# Patient Record
Sex: Female | Born: 1940 | Race: White | Hispanic: No | Marital: Married | Smoking: Never smoker
Health system: Southern US, Community
[De-identification: ages and names within clinical notes are randomized; demographics above are authoritative.]

## PROBLEM LIST (undated history)

## (undated) DIAGNOSIS — H269 Unspecified cataract: Secondary | ICD-10-CM

## (undated) DIAGNOSIS — F32A Depression, unspecified: Secondary | ICD-10-CM

## (undated) DIAGNOSIS — K219 Gastro-esophageal reflux disease without esophagitis: Secondary | ICD-10-CM

## (undated) DIAGNOSIS — M1990 Unspecified osteoarthritis, unspecified site: Secondary | ICD-10-CM

## (undated) DIAGNOSIS — F329 Major depressive disorder, single episode, unspecified: Secondary | ICD-10-CM

## (undated) DIAGNOSIS — M069 Rheumatoid arthritis, unspecified: Secondary | ICD-10-CM

## (undated) DIAGNOSIS — R51 Headache: Secondary | ICD-10-CM

## (undated) DIAGNOSIS — R011 Cardiac murmur, unspecified: Secondary | ICD-10-CM

## (undated) DIAGNOSIS — E039 Hypothyroidism, unspecified: Secondary | ICD-10-CM

## (undated) DIAGNOSIS — T7840XA Allergy, unspecified, initial encounter: Secondary | ICD-10-CM

## (undated) DIAGNOSIS — F419 Anxiety disorder, unspecified: Secondary | ICD-10-CM

## (undated) DIAGNOSIS — R7303 Prediabetes: Secondary | ICD-10-CM

## (undated) HISTORY — DX: Allergy, unspecified, initial encounter: T78.40XA

## (undated) HISTORY — PX: APPENDECTOMY: SHX54

## (undated) HISTORY — PX: JOINT REPLACEMENT: SHX530

## (undated) HISTORY — PX: CHOLECYSTECTOMY: SHX55

## (undated) HISTORY — PX: SPINE SURGERY: SHX786

## (undated) HISTORY — PX: TOTAL HIP ARTHROPLASTY: SHX124

## (undated) HISTORY — PX: ABDOMINAL HYSTERECTOMY: SHX81

## (undated) HISTORY — PX: REPLACEMENT TOTAL KNEE BILATERAL: SUR1225

## (undated) HISTORY — DX: Unspecified cataract: H26.9

## (undated) HISTORY — PX: TONSILLECTOMY: SUR1361

## (undated) HISTORY — DX: Gastro-esophageal reflux disease without esophagitis: K21.9

---

## 2001-05-12 ENCOUNTER — Encounter: Payer: Self-pay | Admitting: Orthopedic Surgery

## 2001-05-20 ENCOUNTER — Encounter: Payer: Self-pay | Admitting: Orthopedic Surgery

## 2001-05-20 ENCOUNTER — Inpatient Hospital Stay (HOSPITAL_COMMUNITY): Admission: RE | Admit: 2001-05-20 | Discharge: 2001-05-27 | Payer: Self-pay | Admitting: Orthopedic Surgery

## 2008-07-30 ENCOUNTER — Ambulatory Visit (HOSPITAL_BASED_OUTPATIENT_CLINIC_OR_DEPARTMENT_OTHER): Admission: RE | Admit: 2008-07-30 | Discharge: 2008-07-30 | Payer: Self-pay | Admitting: Internal Medicine

## 2008-07-30 ENCOUNTER — Ambulatory Visit: Payer: Self-pay | Admitting: Diagnostic Radiology

## 2010-09-12 NOTE — Op Note (Signed)
[facility]  Patient:    Joann Maddox, Joann Maddox Visit Number: [id number] MRN: [id number]          Service Type: SUR Location: 4W 0471 01 Attending Physician:  Skip Mayer Dictated by:   Georges Lynch Darrelyn Hillock, M.D. Proc. Date: 05/20/01 Admit Date:  05/20/2001                             Operative Report  SURGEON:  Windy Fast A. Darrelyn Hillock, M.D.  ASSISTANT:  Ollen Gross, M.D.  PREOPERATIVE DIAGNOSIS:  Severe degenerative arthritis, right hip.  POSTOPERATIVE DIAGNOSIS:  Severe degenerative arthritis, right hip.  OPERATION:  Right total hip arthroplasty utilizing the porous-coated system, Osteonics-type.  Sizes used were a size 8 femoral component for the femoral component.  The cup was a 50 mm PSL microstructured cup with two screws, and the insert was a size 10 degree insert.  The head size was a plus 5 C-tapered head.  DESCRIPTION OF PROCEDURE:  Under general anesthesia, a routine orthopedic prep and draping of the right hip was carried out.  She had 1 g of IV Ancef preop. At this time, an incision was made over the posterolateral aspect of the right hip, bleeders identified and cauterized.  The incision was carried down to the iliotibial band; an incision was made in the iliotibial band, and self-retaining retractors were inserted.  At this time, I then detached the external rotators with great care taken not to injure the sciatic nerve.  We detached the Zickel band as well.  I then did a capsulectomy, dislocated the head, and then amputated the femoral head in the usual fashion.  Following that, we then reamed and rasped up to a size 8 femoral component.  The distal ream was a 12.5.  Following that, we then prepared the acetabulum by reaming up to a size 50 mm cup.  We then inserted our permanent cup with two screws. We inserted our trial liner, went through a range of motion with a plus 0 then a plus 5 C-tapered head.  The plus 5 C-tapered head  was the most stable.  We had excellent position of the cup.  We had excellent stability on the table. Following that, we then thoroughly irrigated out the area, removed the trial cup insert, and then inserted our permanent 10 degree polyethylene liner. After this, we then inserted our permanent secure-fit 127 degree angle size 8 femoral component.  We then went through a plus 0 then a plus 5 C-tapered head for trials once again and felt that the plus 5 C-tapered head was the most stable.  We had excellent stability and good range of motion.  There was no impingement.  So we then removed the trial plus 5 C-tapered head and inserted our permanent plus 5 C-tapered head.  We then irrigated out the acetabulum, reduced the hip, and had excellent function.  We then repaired all of the soft tissue structures, and the wound was closed in layers in the usual fashion. Sterile dressings were applied.  The patient was given another gram of IV Ancef before she left the operating room.  She had a clot in her right leg 15 years ago when she had arthroscopy, and she was put on Coumadin at that time. So we will take all of the precautions.  We will have her on a PAS compression hose.  She will be on a heparin protocol for three days and Coumadin  at the same time. Dictated by:   Georges Lynch Darrelyn Hillock, M.D. Attending Physician:  Skip Mayer DD:  05/20/01 TD:  05/21/01 Job: [phone]

## 2010-09-12 NOTE — Discharge Summary (Signed)
[facility]  Patient:    Joann Maddox, MAJER Visit Number: [id number] MRN: [id number]          Service Type: SUR Location: 4W 0471 01 Attending Physician:  Skip Mayer Dictated by:   Marcie Bal Velna Ochs, P.A.-C Admit Date:  05/20/2001 Discharge Date: 05/27/2001   CC:         Macarthur Critchley. Shelva Majestic, M.D.  Daniel L. Thomasena Edis, M.D.   Discharge Summary  PRIMARY DIAGNOSES: 1. Degenerative arthritis, right hip. 2. Acute blood loss anemia, status post transfusion.  SECONDARY DIAGNOSES: 1. Thyroid disorder. 2. Anxiety disorder. 3. History of anemia. 4. History of urinary tract infections. 5. History of heart murmur. 6. Migraine headaches. 7. History of gastroesophageal reflux. 8. History of blood clot, right leg.  SURGICAL PROCEDURE:  Right total hip arthroplasty by Dr. Darrelyn Hillock with the assistance of Dr. Lequita Halt on May 20, 2001.  Please see operative summary for further details.  CONSULTATIONS:  Dr. Thomasena Edis in physical medicine and rehabilitation.  LABORATORY DATA:  Preoperative hemoglobin and hematocrit were 12.2 and 37.2, respectively on May 12, 2001.  She reached a low on May 23, 2001, of 8.0 and 23.9, respectively.  She was transfused 2 units, and on May 26, 2001, she was back up to 10.4 and 29.7, respectively.  Preoperative PT, INR, and PTT were 13.8, 1.1, and 31, respectively.  Prior to discharge, she had a therapeutic INR of 2.3.  Routine chemistry preoperatively was all within normal limits, with the exception of slightly elevated glucose of 126.  Her BUN and creatinine were 14 and 0.6.  Sodium and potassium were 138 and 3.7, respectively.  Urinalysis preoperatively was all within normal limits with negative parameters.  A venous Doppler on May 26, 2001, showed no evidence of deep venous thrombosis or superficial thrombus or Bakers cyst.  Chest x-ray on May 12, 2001, showed no active chest disease.   Postoperative hip x-ray showed satisfactory right total hip replacement.  CHIEF COMPLAINT:  Right hip pain.  HISTORY OF PRESENT ILLNESS:  Joann Maddox is a 70 year old female who presents with complaints of increasing right hip pain for more than a years duration. She had undergone conservative measures without significant relief.  Pain was now interfering with her activities of daily living.  She elected to undergo surgical intervention.  Dr. Shelva Majestic obtained a preoperative Cardiolite study, and felt the patient was stable and clear for the planned surgery. Preoperative labs and signed surgical consents were obtained.  She was admitted on May 20, 2001, for surgical intervention.  HOSPITAL COURSE:  Following the surgical procedure, the patient was taken to the recovery room in stable condition, transferred to the orthopedic floor in good condition.  She had progressed slowly with therapy.  Initially, she was on PCA morphine which was then switched to PCA Dilaudid for better pain control.  She was on Coumadin for deep vein thrombosis prophylaxis, and had a therapeutic INR prior to discharge.  She received routine postoperative antibiotics.  Her hemoglobin dropped to 8.0 on postoperative day #3, and she was transfused 2 units of packed red blood cells.  This brought her hemoglobin back up, and she remained stable afterwards.  The wound was examined on postoperative day #2, and subsequently, and she did have some continued serous drainage from that hip, so she was started on p.o. Keflex.  Otherwise, there were no signs or symptoms of infection.  A rehabilitation consult was requested, and Dr. Thomasena Edis felt the patient would be appropriate for inpatient  rehabilitation given her slow progress with therapy.  However, [employer] would not approve any inpatient rehabilitation stay.  Therefore, rehabilitation with physical therapy was continued on this acute admission. Home health  arrangements were made.  On postoperative day #6, she was doing better with therapy and ambulating up to about 60 feet.  She did have tenderness and swelling in the affected surgical leg, and given her history of deep venous thrombosis, a venous Doppler was obtained which showed no evidence of an acute deep venous thrombosis.  By postoperative day #7, on May 27, 2001, she was stable and ready for discharge home.  DISPOSITION:  The patient is being discharged home with [facility].  DIET:  Low sodium.  FOLLOWUP:  Dr. Darrelyn Hillock in one week.  Call [phone] for an appointment.  ACTIVITY:  Touchdown weightbearing.  Total hip dislocation precautions.  Okay to shower once daily.  B.i.d. dressing changes to the hip.  DISCHARGE MEDICATIONS: 1. Coumadin per pharmacy dosing. 2. Mepergan Fortis one p.o. q.4-6h. p.r.n. pain. 3. Robaxin 500 mg p.o. q.8h. p.r.n. spasm. 4. Keflex 500 mg p.o. q.i.d. x5 days. 5. Nadolol 40 mg p.o. q.d. 6. Celexa 20 mg p.o. q.d. 7. Armour-Thyroid 30 mg p.o. q.d. 8. Senokot.  CONDITION ON DISCHARGE:  Good and improved. Dictated by:   Marcie Bal Velna Ochs, P.A.-C Attending Physician:  Skip Mayer DD:  06/08/01 TD:  06/08/01 Job: [id number]

## 2010-09-12 NOTE — H&P (Signed)
[facility]  Patient:    Joann Maddox, Joann Maddox Visit Number: [id number] MRN: [id number]          Service Type: Attending:  Georges Lynch. Darrelyn Hillock, M.D. Dictated by:   Marcie Bal Troncale, P.A.C. Adm. Date:  05/20/01                           History and Physical  DATE OF BIRTH:  12-23-1940  SSN:  [id number]  CHIEF COMPLAINT:  Right hip pain.  HISTORY OF PRESENT ILLNESS:  Joann Maddox is a 70 year old female who presents with increasing right hip pain for more than a years duration.  She is now requiring the use of a cane to simply get around.  She reports daily pain that is at this point unrelieved with conservative measures.  She has now elected to undergo surgical intervention.  REVIEW OF SYSTEMS:  She denies any recent fever or chills.  No diplopia, blurred vision, or headaches.  No rhinorrhea, sore throat, or earache.  No chest pain, shortness of breath, or cough.  She denies abdominal pain, nausea, vomiting, or diarrhea.  She does report a significant history of constipation. She denies any melena or bright red blood per rectum.  No urinary frequency, hematuria, or dysuria.  No numbness or tingling of her extremities.  PAST MEDICAL HISTORY: 1. Significant for a thyroid disorder. 2. Anxiety disorder. 3. Past history of a blood clot in her right leg after knee arthroscopy in    1987. 4. Also history of frequent UTIs. 5. Gastroesophageal reflux. 6. Migraine headaches. 7. Heart murmur. 8. Anemia. 9. She denies strokes, seizures, cancer, diabetes, hypertension, heart, lung,    or kidney disease.  PAST SURGICAL HISTORY: 1. Hysterectomy. 2. Knee arthroscopy. 3. Appendectomy. 4. Cholecystectomy.  FAMILY HISTORY:  Mother had a stroke.  Father had an MI, and her son has Sticklers disease.  ALLERGIES:  CODEINE and IODINE DYE.  She is able to tolerate Betadine.  MEDICATIONS: 1. Nadolol 40 mg q.d. 2. Celexa 20 mg q.d. 3. Armour Thyroid 30 mg  q.d. 4. Fioricet p.r.n. 5. Senokot. 6. Mobic. 7. Ultram. 8. Estropipate 1.5 mg q.d.  SOCIAL HISTORY:  She is married.  She is retired.  She denies tobacco or alcohol use.  She has three steps leading into her home.  Her main living space is on one floor.  Dr. Sundra Aland in [place] is the patients family physician, and Dr. Shelva Majestic in [facility] is her cardiologist.  PHYSICAL EXAMINATION:  VITAL SIGNS:  She is afebrile, pulse 74, respiratory 14, blood pressure 122/90.  GENERAL:  This is a well-developed, well-nourished female in no acute distress.  She walks with a marked antalgic gait on the right.  HEENT:  Head is atraumatic, normocephalic.  Pupils equal, round, and react to light.  Extraocular movements are grossly intact.  Oropharynx is clear without redness, exudate, or lesions.  NECK:  Supple with no cervical lymphadenopathy.  CHEST:  Clear to auscultation bilaterally with no lesions or crackles.  HEART:  Regular rate and rhythm with no murmurs, rubs, or gallops.  ABDOMEN:  Soft, nontender, nondistended, with no masses and no hepatosplenomegaly.  Active bowel sounds.  BREASTS/GENITOURINARY:  Not examined, is not pertinent to present illness.  EXTREMITIES:  Shows good peripheral pulses.  Motor and sensory are grossly intact.  She does have marked restriction of range of motion on the right hip with 5 degrees internal rotation and 5 degrees of external  rotation.  Her leg lengths are equal.  LABORATORY:  X-rays demonstrate severe, degenerative arthritis of the right hip.  IMPRESSION: 1. Degenerative arthritis, right hip. 2. Thyroid disorder. 3. Anxiety disorder. 4. History of anemia. 5. History of urinary tract infection. 6. History of heart murmur. 7. Migraine headaches. 8. History of gastroesophageal reflux. 9. History of blood clot, right leg.  PLAN:  The patient will be admitted to [facility] to undergo a right total hip arthroplasty by Dr.  Darrelyn Hillock on May 20, 2001.  Preoperative labs and signed surgical consents will be obtained.  Dr. Darrelyn Hillock has been in contact with Dr. Shelva Majestic, who cleared the patient for surgry.  Dr. Shelva Majestic was going to obtain a Cardiolite study on the patient preoperatively as well.  All questions have been encouraged and answered. Dictated by:   Marcie Bal Troncale, P.A.C. Attending:  Georges Lynch. Darrelyn Hillock, M.D. DD:  05/12/01 TD:  05/12/01 Job: [id number]

## 2012-01-08 ENCOUNTER — Other Ambulatory Visit (HOSPITAL_COMMUNITY): Payer: Self-pay | Admitting: *Deleted

## 2012-01-12 DIAGNOSIS — R739 Hyperglycemia, unspecified: Secondary | ICD-10-CM | POA: Insufficient documentation

## 2012-01-20 ENCOUNTER — Encounter (HOSPITAL_COMMUNITY)
Admission: RE | Admit: 2012-01-20 | Discharge: 2012-01-20 | Disposition: A | Discharge status: Home / Self Care | Payer: MEDICARE | Source: Ambulatory Visit | Attending: Internal Medicine | Admitting: Internal Medicine

## 2012-01-20 ENCOUNTER — Encounter (HOSPITAL_COMMUNITY): Payer: Self-pay

## 2012-01-20 DIAGNOSIS — M069 Rheumatoid arthritis, unspecified: Secondary | ICD-10-CM | POA: Insufficient documentation

## 2012-01-20 HISTORY — DX: Headache: R51

## 2012-01-20 HISTORY — DX: Anxiety disorder, unspecified: F41.9

## 2012-01-20 HISTORY — DX: Cardiac murmur, unspecified: R01.1

## 2012-01-20 MED ORDER — SODIUM CHLORIDE 0.9 % IV SOLN: 1000.0000 mg | INTRAVENOUS | Status: DC

## 2012-01-20 MED ORDER — SODIUM CHLORIDE 0.9 % IV SOLN
1000.0000 mg | Freq: Once | INTRAVENOUS | Status: AC
  Administered 2012-01-20: 1000 mg via INTRAVENOUS
  Filled 2012-01-20: qty 40

## 2012-01-20 MED ORDER — LORATADINE 10 MG PO TABS
10.0000 mg | ORAL_TABLET | Freq: Once | ORAL | Status: AC
  Administered 2012-01-20: 10 mg via ORAL
  Filled 2012-01-20: qty 1

## 2012-01-20 MED ORDER — SODIUM CHLORIDE 0.9 % IV SOLN
INTRAVENOUS | Status: DC
  Administered 2012-01-20: 14:00:00 via INTRAVENOUS

## 2012-01-20 MED ORDER — ACETAMINOPHEN 500 MG PO TABS
500.0000 mg | ORAL_TABLET | Freq: Once | ORAL | Status: AC
  Administered 2012-01-20: 500 mg via ORAL

## 2012-01-20 MED ORDER — ACETAMINOPHEN 500 MG PO TABS
ORAL_TABLET | ORAL | Status: AC
  Administered 2012-01-20: 500 mg via ORAL
  Filled 2012-01-20: qty 1

## 2012-02-03 ENCOUNTER — Encounter (HOSPITAL_COMMUNITY): Payer: Self-pay

## 2012-02-03 ENCOUNTER — Encounter (HOSPITAL_COMMUNITY)
Admission: RE | Admit: 2012-02-03 | Discharge: 2012-02-03 | Disposition: A | Discharge status: Home / Self Care | Payer: MEDICARE | Source: Ambulatory Visit | Attending: Internal Medicine | Admitting: Internal Medicine

## 2012-02-03 DIAGNOSIS — M069 Rheumatoid arthritis, unspecified: Secondary | ICD-10-CM | POA: Insufficient documentation

## 2012-02-03 HISTORY — DX: Rheumatoid arthritis, unspecified: M06.9

## 2012-02-03 HISTORY — DX: Hypothyroidism, unspecified: E03.9

## 2012-02-03 MED ORDER — LORATADINE 10 MG PO TABS
10.0000 mg | ORAL_TABLET | Freq: Once | ORAL | Status: AC
  Administered 2012-02-03: 10 mg via ORAL
  Filled 2012-02-03: qty 1

## 2012-02-03 MED ORDER — SODIUM CHLORIDE 0.9 % IV SOLN
INTRAVENOUS | Status: DC
  Administered 2012-02-03: 14:00:00 via INTRAVENOUS

## 2012-02-03 MED ORDER — ACETAMINOPHEN 500 MG PO TABS
500.0000 mg | ORAL_TABLET | Freq: Once | ORAL | Status: AC
  Administered 2012-02-03: 500 mg via ORAL
  Filled 2012-02-03: qty 1

## 2012-02-03 MED ORDER — SODIUM CHLORIDE 0.9 % IV SOLN
1000.0000 mg | Freq: Once | INTRAVENOUS | Status: AC
  Administered 2012-02-03: 1000 mg via INTRAVENOUS
  Filled 2012-02-03: qty 40

## 2012-02-16 DIAGNOSIS — N3946 Mixed incontinence: Secondary | ICD-10-CM | POA: Insufficient documentation

## 2012-02-16 DIAGNOSIS — R5383 Other fatigue: Secondary | ICD-10-CM | POA: Insufficient documentation

## 2012-02-16 DIAGNOSIS — N3941 Urge incontinence: Secondary | ICD-10-CM | POA: Insufficient documentation

## 2012-02-17 ENCOUNTER — Encounter (HOSPITAL_COMMUNITY): Payer: Self-pay

## 2012-02-17 ENCOUNTER — Encounter (HOSPITAL_COMMUNITY)
Admission: RE | Admit: 2012-02-17 | Discharge: 2012-02-17 | Disposition: A | Discharge status: Home / Self Care | Payer: MEDICARE | Source: Ambulatory Visit | Attending: Internal Medicine | Admitting: Internal Medicine

## 2012-02-17 MED ORDER — SODIUM CHLORIDE 0.9 % IV SOLN
1000.0000 mg | Freq: Once | INTRAVENOUS | Status: AC
  Administered 2012-02-17: 1000 mg via INTRAVENOUS
  Filled 2012-02-17: qty 40

## 2012-02-17 MED ORDER — SODIUM CHLORIDE 0.9 % IV SOLN
Freq: Once | INTRAVENOUS | Status: AC
  Administered 2012-02-17: 14:00:00 via INTRAVENOUS

## 2012-02-17 MED ORDER — LORATADINE 10 MG PO TABS
10.0000 mg | ORAL_TABLET | Freq: Once | ORAL | Status: AC
  Administered 2012-02-17: 10 mg via ORAL
  Filled 2012-02-17: qty 1

## 2012-02-17 MED ORDER — ACETAMINOPHEN 500 MG PO TABS
500.0000 mg | ORAL_TABLET | Freq: Once | ORAL | Status: AC
  Administered 2012-02-17: 500 mg via ORAL
  Filled 2012-02-17: qty 1

## 2012-02-17 NOTE — Progress Notes (Signed)
Pt here for 3rd infusion of ORENCIA. States 2 weeks ago after 2nd infusion had some nausea the day after and has felt more tired this time. Today no nausea but still some tiredness . Saw new Primary MD yesterday to get established with her and she drew blood work . Pt states she would like to get her [facility] as ordered today and will contact Dr Dierdre Forth for any further nausea and any increase in the "tiredness" today

## 2012-02-17 NOTE — Progress Notes (Signed)
Since pt had nausea after last infusion of ORENCIA,today's dose was infused over 1 hour instead of 30 minutes

## 2012-03-16 ENCOUNTER — Encounter (HOSPITAL_COMMUNITY): Payer: Self-pay

## 2012-03-16 ENCOUNTER — Encounter (HOSPITAL_COMMUNITY)
Admission: RE | Admit: 2012-03-16 | Discharge: 2012-03-16 | Disposition: A | Discharge status: Home / Self Care | Payer: MEDICARE | Source: Ambulatory Visit | Attending: Internal Medicine | Admitting: Internal Medicine

## 2012-03-16 DIAGNOSIS — M069 Rheumatoid arthritis, unspecified: Secondary | ICD-10-CM | POA: Insufficient documentation

## 2012-03-16 MED ORDER — SODIUM CHLORIDE 0.9 % IV SOLN
INTRAVENOUS | Status: AC
  Administered 2012-03-16: 14:00:00 via INTRAVENOUS

## 2012-03-16 MED ORDER — SODIUM CHLORIDE 0.9 % IV SOLN
1000.0000 mg | INTRAVENOUS | Status: DC
  Administered 2012-03-16: 1000 mg via INTRAVENOUS
  Filled 2012-03-16: qty 40

## 2012-03-16 MED ORDER — LORATADINE 10 MG PO TABS
10.0000 mg | ORAL_TABLET | ORAL | Status: DC
  Administered 2012-03-16: 10 mg via ORAL
  Filled 2012-03-16: qty 1

## 2012-03-16 MED ORDER — ACETAMINOPHEN 500 MG PO TABS
500.0000 mg | ORAL_TABLET | ORAL | Status: DC
  Administered 2012-03-16: 500 mg via ORAL
  Filled 2012-03-16: qty 1

## 2012-04-12 NOTE — Progress Notes (Signed)
Pt is scheduled for ORENCIA infusion 04/13/12 and if feeling like she has a UTI. She has tried to call PCP but was unable to contact him. I asked her to call Dr Dierdre Forth to make sure he wanted her to receive her [facility] tomorrow and let him know about her symptoms. Pt verbalized understanding

## 2012-04-13 ENCOUNTER — Encounter (HOSPITAL_COMMUNITY)
Admission: RE | Admit: 2012-04-13 | Discharge: 2012-04-13 | Disposition: A | Discharge status: Home / Self Care | Payer: MEDICARE | Source: Ambulatory Visit | Attending: Internal Medicine | Admitting: Internal Medicine

## 2012-04-13 DIAGNOSIS — M069 Rheumatoid arthritis, unspecified: Secondary | ICD-10-CM | POA: Insufficient documentation

## 2012-05-03 ENCOUNTER — Other Ambulatory Visit (HOSPITAL_COMMUNITY): Payer: Self-pay | Admitting: Internal Medicine

## 2012-05-04 ENCOUNTER — Encounter (HOSPITAL_COMMUNITY)
Admission: RE | Admit: 2012-05-04 | Discharge: 2012-05-04 | Disposition: A | Discharge status: Home / Self Care | Payer: MEDICARE | Source: Ambulatory Visit | Attending: Internal Medicine | Admitting: Internal Medicine

## 2012-05-04 DIAGNOSIS — M069 Rheumatoid arthritis, unspecified: Secondary | ICD-10-CM | POA: Insufficient documentation

## 2012-05-04 MED ORDER — LORATADINE 10 MG PO TABS
10.0000 mg | ORAL_TABLET | ORAL | Status: DC
  Administered 2012-05-04: 10 mg via ORAL
  Filled 2012-05-04: qty 1

## 2012-05-04 MED ORDER — SODIUM CHLORIDE 0.9 % IV SOLN
1000.0000 mg | INTRAVENOUS | Status: DC
  Administered 2012-05-04: 1000 mg via INTRAVENOUS
  Filled 2012-05-04: qty 40

## 2012-05-04 MED ORDER — ACETAMINOPHEN 500 MG PO TABS
500.0000 mg | ORAL_TABLET | ORAL | Status: DC
  Administered 2012-05-04: 500 mg via ORAL
  Filled 2012-05-04: qty 1

## 2012-05-04 MED ORDER — SODIUM CHLORIDE 0.9 % IV SOLN
INTRAVENOUS | Status: DC
  Administered 2012-05-04: 13:00:00 via INTRAVENOUS

## 2012-06-01 ENCOUNTER — Encounter (HOSPITAL_COMMUNITY): Payer: Self-pay

## 2012-06-01 ENCOUNTER — Encounter (HOSPITAL_COMMUNITY)
Admission: RE | Admit: 2012-06-01 | Discharge: 2012-06-01 | Disposition: A | Discharge status: Home / Self Care | Payer: MEDICARE | Source: Ambulatory Visit | Attending: Internal Medicine | Admitting: Internal Medicine

## 2012-06-01 DIAGNOSIS — M069 Rheumatoid arthritis, unspecified: Secondary | ICD-10-CM | POA: Insufficient documentation

## 2012-06-01 MED ORDER — SODIUM CHLORIDE 0.9 % IV SOLN
1000.0000 mg | INTRAVENOUS | Status: DC
  Administered 2012-06-01: 1000 mg via INTRAVENOUS
  Filled 2012-06-01: qty 40

## 2012-06-01 MED ORDER — SODIUM CHLORIDE 0.9 % IV SOLN
INTRAVENOUS | Status: DC
  Administered 2012-06-01: 13:00:00 via INTRAVENOUS

## 2012-06-01 MED ORDER — LORATADINE 10 MG PO TABS
10.0000 mg | ORAL_TABLET | ORAL | Status: DC
  Administered 2012-06-01: 10 mg via ORAL
  Filled 2012-06-01: qty 1

## 2012-06-01 MED ORDER — ACETAMINOPHEN 500 MG PO TABS
500.0000 mg | ORAL_TABLET | ORAL | Status: DC
  Administered 2012-06-01: 500 mg via ORAL
  Filled 2012-06-01: qty 1

## 2012-06-01 NOTE — Progress Notes (Signed)
Patient tolerated infusion well. No complaints. Verbalizes understanding of discharge instructions. Instructed to call physician for any concerns following discharge from short stay.

## 2012-06-29 ENCOUNTER — Encounter (HOSPITAL_COMMUNITY): Payer: Self-pay

## 2012-06-29 ENCOUNTER — Encounter (HOSPITAL_COMMUNITY)
Admission: RE | Admit: 2012-06-29 | Discharge: 2012-06-29 | Disposition: A | Discharge status: Home / Self Care | Payer: MEDICARE | Source: Ambulatory Visit | Attending: Internal Medicine | Admitting: Internal Medicine

## 2012-06-29 DIAGNOSIS — M069 Rheumatoid arthritis, unspecified: Secondary | ICD-10-CM | POA: Insufficient documentation

## 2012-06-29 MED ORDER — SODIUM CHLORIDE 0.9 % IV SOLN
INTRAVENOUS | Status: AC
  Administered 2012-06-29: 14:00:00 via INTRAVENOUS

## 2012-06-29 MED ORDER — SODIUM CHLORIDE 0.9 % IV SOLN
1000.0000 mg | INTRAVENOUS | Status: AC
  Administered 2012-06-29: 1000 mg via INTRAVENOUS
  Filled 2012-06-29: qty 40

## 2012-06-29 MED ORDER — ACETAMINOPHEN 500 MG PO TABS
500.0000 mg | ORAL_TABLET | ORAL | Status: AC
  Administered 2012-06-29: 500 mg via ORAL
  Filled 2012-06-29: qty 1

## 2012-06-29 MED ORDER — LORATADINE 10 MG PO TABS
10.0000 mg | ORAL_TABLET | ORAL | Status: AC
  Administered 2012-06-29: 10 mg via ORAL
  Filled 2012-06-29: qty 1

## 2012-07-27 ENCOUNTER — Encounter (HOSPITAL_COMMUNITY)
Admission: RE | Admit: 2012-07-27 | Discharge: 2012-07-27 | Disposition: A | Discharge status: Home / Self Care | Payer: MEDICARE | Source: Ambulatory Visit | Attending: Internal Medicine | Admitting: Internal Medicine

## 2012-07-27 ENCOUNTER — Encounter (HOSPITAL_COMMUNITY): Payer: Self-pay

## 2012-07-27 DIAGNOSIS — M069 Rheumatoid arthritis, unspecified: Secondary | ICD-10-CM | POA: Insufficient documentation

## 2012-07-27 MED ORDER — LORATADINE 10 MG PO TABS
10.0000 mg | ORAL_TABLET | ORAL | Status: DC
  Administered 2012-07-27: 10 mg via ORAL
  Filled 2012-07-27: qty 1

## 2012-07-27 MED ORDER — ACETAMINOPHEN 500 MG PO TABS
500.0000 mg | ORAL_TABLET | ORAL | Status: DC
  Administered 2012-07-27: 500 mg via ORAL
  Filled 2012-07-27: qty 1

## 2012-07-27 MED ORDER — SODIUM CHLORIDE 0.9 % IV SOLN
INTRAVENOUS | Status: DC
  Administered 2012-07-27: 14:00:00 via INTRAVENOUS

## 2012-07-27 MED ORDER — SODIUM CHLORIDE 0.9 % IV SOLN
1000.0000 mg | INTRAVENOUS | Status: DC
  Administered 2012-07-27: 1000 mg via INTRAVENOUS
  Filled 2012-07-27: qty 40

## 2012-08-05 LAB — VITAMIN B12: Vitamin B-12: 343

## 2012-08-24 ENCOUNTER — Encounter (HOSPITAL_COMMUNITY): Admission: RE | Admit: 2012-08-24 | Payer: MEDICARE | Source: Ambulatory Visit

## 2012-08-29 LAB — HEPATIC FUNCTION PANEL
ALT: 21 U/L (ref 7–35)
AST: 23 U/L (ref 13–35)

## 2012-08-29 LAB — TSH: TSH: 0.61 u[IU]/mL (ref 0.41–5.90)

## 2012-08-29 LAB — LIPID PANEL
Cholesterol: 186 mg/dL (ref 0–200)
HDL: 41 mg/dL (ref 35–70)
LDL Cholesterol: 100 mg/dL
LDl/HDL Ratio: 2.4
Triglycerides: 224 mg/dL — AB (ref 40–160)

## 2012-08-29 LAB — BASIC METABOLIC PANEL: Creatinine: 0.6 mg/dL (ref 0.5–1.1)

## 2012-09-21 ENCOUNTER — Encounter (HOSPITAL_COMMUNITY): Payer: MEDICARE

## 2013-05-04 ENCOUNTER — Encounter: Payer: Self-pay | Admitting: Family Medicine

## 2013-05-04 ENCOUNTER — Ambulatory Visit (INDEPENDENT_AMBULATORY_CARE_PROVIDER_SITE_OTHER): Payer: MEDICARE | Admitting: Family Medicine

## 2013-05-04 VITALS — BP 125/58 | HR 72 | Temp 97.5°F | Ht 61.0 in | Wt 218.0 lb

## 2013-05-04 DIAGNOSIS — G4733 Obstructive sleep apnea (adult) (pediatric): Secondary | ICD-10-CM

## 2013-05-04 DIAGNOSIS — G43109 Migraine with aura, not intractable, without status migrainosus: Secondary | ICD-10-CM

## 2013-05-04 DIAGNOSIS — E039 Hypothyroidism, unspecified: Secondary | ICD-10-CM

## 2013-05-04 DIAGNOSIS — R002 Palpitations: Secondary | ICD-10-CM

## 2013-05-04 DIAGNOSIS — E0590 Thyrotoxicosis, unspecified without thyrotoxic crisis or storm: Secondary | ICD-10-CM | POA: Insufficient documentation

## 2013-05-04 DIAGNOSIS — R011 Cardiac murmur, unspecified: Secondary | ICD-10-CM

## 2013-05-04 DIAGNOSIS — Z1231 Encounter for screening mammogram for malignant neoplasm of breast: Secondary | ICD-10-CM

## 2013-05-04 DIAGNOSIS — M069 Rheumatoid arthritis, unspecified: Secondary | ICD-10-CM

## 2013-05-04 DIAGNOSIS — R7309 Other abnormal glucose: Secondary | ICD-10-CM

## 2013-05-04 DIAGNOSIS — I10 Essential (primary) hypertension: Secondary | ICD-10-CM

## 2013-05-04 DIAGNOSIS — E785 Hyperlipidemia, unspecified: Secondary | ICD-10-CM

## 2013-05-04 NOTE — Progress Notes (Signed)
   Subjective:    Patient ID: Joann Maddox, female    DOB: 01-May-1940, 73 y.o.   MRN: [id number]  HPI Here to estab care today. Hx of RA and OA and sees Dr. Dierdre Forth. She was on Orencia and wasn't helpful.   Hypertension- Pt denies chest pain, SOB, dizziness, or heart palpitations.  Taking meds as directed w/o problems.  Denies medication side effects.    Migraine - well controlled. Infrequent now that she is older.   Husband has sleep apnea - her husband is concerned she has sleep apnea. She was tested 8 years ago.  Wasn't able to be retested.  She is interested in getting re-evaluated.  Says was unable to wear the mask.    Hypothyroid - med was recently changed from Armour Thyroid to levothyroxine. She's not had repeat lab since then is due for this. She feels like she's tolerating it well. She is really having complaints about it.  Review of Systems  Constitutional: Negative for fever, diaphoresis and unexpected weight change.  HENT: Negative for hearing loss, postnasal drip, sneezing and tinnitus.   Eyes: Negative for visual disturbance.  Respiratory: Negative for cough and wheezing.   Cardiovascular: Negative for chest pain and palpitations.  Genitourinary: Negative for vaginal bleeding and vaginal discharge.       Nighttime urination  Musculoskeletal: Positive for arthralgias and myalgias.  Neurological: Negative for headaches.  Hematological: Negative for adenopathy. Does not bruise/bleed easily.       Objective:   Physical Exam  Constitutional: She is oriented to person, place, and time. She appears well-developed and well-nourished.  HENT:  Head: Normocephalic and atraumatic.  Right Ear: External ear normal.  Left Ear: External ear normal.  Eyes: Conjunctivae are normal.  Neck: Neck supple. No thyromegaly present.  Cardiovascular: Normal rate, regular rhythm and normal heart sounds.   No carotid bruits   Pulmonary/Chest: Effort normal and breath sounds normal.   Musculoskeletal: She exhibits edema.  Trace edema ankles bilatearlly  Lymphadenopathy:    She has no cervical adenopathy.  Neurological: She is alert and oriented to person, place, and time.  Skin: Skin is warm and dry.  Psychiatric: She has a normal mood and affect. Her behavior is normal. Thought content normal.          Assessment & Plan:  RA - She is back on MTX injection and folic acid.  Follows with Dr. Dierdre Forth. Follows every 3 months.   HTN - well controlled.     OSA - she is willing to so another sleep study since was previously diagnosed and last test about 8 years ago. She prefers to be scheduled in [place].    Migarine w/ aura - well controlled.    Hypothyroid - Med changed a few months ago by provider. Due to recheck.    Due for screening mammogram. Will place order.

## 2013-05-12 LAB — COMPLETE METABOLIC PANEL WITH GFR
ALT: 22 U/L (ref 0–35)
AST: 20 U/L (ref 0–37)
Albumin: 3.8 g/dL (ref 3.5–5.2)
Alkaline Phosphatase: 69 U/L (ref 39–117)
BUN: 10 mg/dL (ref 6–23)
CO2: 28 mEq/L (ref 19–32)
Calcium: 8.9 mg/dL (ref 8.4–10.5)
Chloride: 101 mEq/L (ref 96–112)
Creat: 0.56 mg/dL (ref 0.50–1.10)
GFR, Est African American: >89 mL/min
GFR, Est Non African American: >89 mL/min
Glucose, Bld: 103 mg/dL — ABNORMAL HIGH (ref 70–99)
Potassium: 4 mEq/L (ref 3.5–5.3)
Sodium: 137 mEq/L (ref 135–145)
Total Bilirubin: 0.7 mg/dL (ref 0.3–1.2)
Total Protein: 6.1 g/dL (ref 6.0–8.3)

## 2013-05-12 LAB — TSH: TSH: 0.495 u[IU]/mL (ref 0.350–4.500)

## 2013-05-12 LAB — LIPID PANEL
Cholesterol: 171 mg/dL (ref 0–200)
HDL: 44 mg/dL (ref >39)
LDL Cholesterol: 107 mg/dL — ABNORMAL HIGH (ref 0–99)
Total CHOL/HDL Ratio: 3.9 Ratio
Triglycerides: 100 mg/dL (ref <150)
VLDL: 20 mg/dL (ref 0–40)

## 2013-05-12 LAB — HOMOCYSTEINE: Homocysteine: 12.4 umol/L (ref 4.0–15.4)

## 2013-05-12 LAB — HEMOGLOBIN A1C
Hgb A1c MFr Bld: 6.2 % — ABNORMAL HIGH (ref <5.7)
Mean Plasma Glucose: 131 mg/dL — ABNORMAL HIGH (ref <117)

## 2013-05-15 ENCOUNTER — Other Ambulatory Visit: Payer: Self-pay | Admitting: *Deleted

## 2013-05-15 MED ORDER — TOLTERODINE TARTRATE 1 MG PO TABS: 1.0000 mg | ORAL_TABLET | Freq: Every day | ORAL | Status: DC

## 2013-05-31 ENCOUNTER — Other Ambulatory Visit: Payer: Self-pay | Admitting: Family Medicine

## 2013-06-08 ENCOUNTER — Other Ambulatory Visit: Payer: Self-pay | Admitting: Family Medicine

## 2013-06-08 ENCOUNTER — Encounter: Payer: Self-pay | Admitting: Family Medicine

## 2013-06-08 DIAGNOSIS — F411 Generalized anxiety disorder: Secondary | ICD-10-CM | POA: Insufficient documentation

## 2013-06-21 ENCOUNTER — Encounter: Payer: Self-pay | Admitting: *Deleted

## 2013-06-28 ENCOUNTER — Other Ambulatory Visit: Payer: Self-pay | Admitting: Family Medicine

## 2013-07-07 ENCOUNTER — Telehealth: Payer: Self-pay | Admitting: Family Medicine

## 2013-07-07 NOTE — Telephone Encounter (Signed)
Call patient: I got the results back on her sleep study. There was no significant sleep apnea which is fantastic. Even though there was some snoring. He did have frequent awakenings. The report said that you were having pain at night.

## 2013-07-11 NOTE — Telephone Encounter (Signed)
lvm w/ results.Joann Maddox Joann Maddox  

## 2013-07-26 ENCOUNTER — Encounter: Payer: Self-pay | Admitting: Family Medicine

## 2013-08-02 ENCOUNTER — Ambulatory Visit (INDEPENDENT_AMBULATORY_CARE_PROVIDER_SITE_OTHER): Payer: MEDICARE | Admitting: Family Medicine

## 2013-08-02 ENCOUNTER — Encounter: Payer: Self-pay | Admitting: Family Medicine

## 2013-08-02 VITALS — BP 128/66 | HR 62 | Ht 60.0 in | Wt 211.0 lb

## 2013-08-02 DIAGNOSIS — Z1231 Encounter for screening mammogram for malignant neoplasm of breast: Secondary | ICD-10-CM

## 2013-08-02 DIAGNOSIS — M069 Rheumatoid arthritis, unspecified: Secondary | ICD-10-CM

## 2013-08-02 DIAGNOSIS — G43109 Migraine with aura, not intractable, without status migrainosus: Secondary | ICD-10-CM

## 2013-08-02 DIAGNOSIS — Z1211 Encounter for screening for malignant neoplasm of colon: Secondary | ICD-10-CM

## 2013-08-02 MED ORDER — FOLIC ACID 1 MG PO TABS: ORAL_TABLET | ORAL | Status: DC

## 2013-08-02 MED ORDER — BUTALBITAL-APAP-CAFFEINE 50-325-40 MG PO TABS: 1.0000 | ORAL_TABLET | Freq: Two times a day (BID) | ORAL | Status: DC | PRN

## 2013-08-02 MED ORDER — TOLTERODINE TARTRATE 1 MG PO TABS: 1.0000 mg | ORAL_TABLET | Freq: Every day | ORAL | Status: DC

## 2013-08-02 MED ORDER — VITAMIN D (ERGOCALCIFEROL) 1.25 MG (50000 UNIT) PO CAPS: ORAL_CAPSULE | ORAL | Status: DC

## 2013-08-02 NOTE — Progress Notes (Signed)
   Subjective:    Patient ID: Joann Maddox, female    DOB: Jan 19, 1941, 73 y.o.   MRN: [id number]  HPI Did have her sleep study and it was normal.  She has been doing well but having pain at night that keeps her awake. Her sleep study was fairly normal which was fantastic.    Needs mammogram.   Migraine - needs refill on Fiorect. Current rx is out of date. Says HA are not frequent.   She would like to try volteran gel. She is on MTX and tramadol.  Says her husband has some for his arthritis and she would like to try it.    Review of Systems     Objective:   Physical Exam  Constitutional: She is oriented to person, place, and time. She appears well-developed and well-nourished.  HENT:  Head: Normocephalic and atraumatic.  Cardiovascular: Normal rate, regular rhythm and normal heart sounds.   Pulmonary/Chest: Effort normal and breath sounds normal.  Neurological: She is alert and oriented to person, place, and time.  Skin: Skin is warm and dry.  Psychiatric: She has a normal mood and affect. Her behavior is normal.          Assessment & Plan:  Sleep study was normal. No need for CPAP which is fantastic.  Colon cancer screening - will call to get report.  She's not sure if she's due or not. She think she had at [facility]. We will call to try to get a copy of the report.  Migraine - will refill Fiorect. Headaches are infrequent and well controlled overall.  Screening mammogram-the order was placed in January but she says she was not contacted. We will call imaging and have them contact her through her mobile phone number.  Rheumatoid arthritis - she wants to try her husband's Voltaren gel first. She can apply up to 4 times a day. It works well then let me know and we can try to send prescription to her pharmacy. If it's not covered through her insurance considered compounded ketoprofen gel.

## 2013-08-15 ENCOUNTER — Ambulatory Visit: Payer: MEDICARE

## 2013-08-15 ENCOUNTER — Ambulatory Visit (INDEPENDENT_AMBULATORY_CARE_PROVIDER_SITE_OTHER): Payer: MEDICARE

## 2013-08-15 DIAGNOSIS — Z1231 Encounter for screening mammogram for malignant neoplasm of breast: Secondary | ICD-10-CM

## 2013-08-17 ENCOUNTER — Telehealth: Payer: Self-pay | Admitting: Family Medicine

## 2013-08-17 NOTE — Telephone Encounter (Signed)
And please call [facility] if we can get a copy of her colonoscopy. I think we have very called they still have not received anything and has been a couple weeks.

## 2013-08-28 NOTE — Telephone Encounter (Signed)
Fax sent to medical records requesting results.Joann Maddox

## 2013-09-01 ENCOUNTER — Telehealth: Payer: Self-pay | Admitting: *Deleted

## 2013-09-01 MED ORDER — TOLTERODINE TARTRATE 2 MG PO TABS: 2.0000 mg | ORAL_TABLET | Freq: Every day | ORAL | Status: DC

## 2013-09-01 NOTE — Telephone Encounter (Signed)
Joann Maddox never prescription for 2 mg dose Detrol. If not controlling symptoms then we might consider switching to a different product.

## 2013-09-01 NOTE — Telephone Encounter (Signed)
Pt states that you told her that if 1mg  Detrol doesn't work that you would increase her to 2mg  Detrol. She states 1mg  is not helping.  , LPN

## 2013-09-04 NOTE — Telephone Encounter (Signed)
LMOM eith response.  Meyer Cory, LPN

## 2013-09-13 ENCOUNTER — Telehealth: Payer: Self-pay | Admitting: Family Medicine

## 2013-09-13 NOTE — Telephone Encounter (Signed)
Call patient to confirm where she think she may have had a colonoscopy. I know we had contacted [facility] back in April. We may need to contact them again or she think she may have had it somewhere else then we can try to get hold of this report.

## 2013-09-19 ENCOUNTER — Encounter: Payer: Self-pay | Admitting: *Deleted

## 2013-09-26 NOTE — Telephone Encounter (Signed)
I spoke with patient and she is not sure of the practice she had her colonoscopy done. I asked who referred her and she said it was Brandt Loosen, MD. She no longer is in practice here in [place]. I called [facility] for records. They said we would need to send a signed release of Medical Records Form. I advised the patient to come in to sign the form. I left form up front.

## 2013-10-30 ENCOUNTER — Other Ambulatory Visit: Payer: Self-pay | Admitting: Family Medicine

## 2013-11-06 ENCOUNTER — Ambulatory Visit (INDEPENDENT_AMBULATORY_CARE_PROVIDER_SITE_OTHER): Payer: MEDICARE | Admitting: Family Medicine

## 2013-11-06 ENCOUNTER — Encounter: Payer: Self-pay | Admitting: Family Medicine

## 2013-11-06 VITALS — BP 112/62 | HR 64 | Ht 60.0 in | Wt 212.0 lb

## 2013-11-06 DIAGNOSIS — M069 Rheumatoid arthritis, unspecified: Secondary | ICD-10-CM

## 2013-11-06 DIAGNOSIS — I10 Essential (primary) hypertension: Secondary | ICD-10-CM

## 2013-11-06 DIAGNOSIS — R7301 Impaired fasting glucose: Secondary | ICD-10-CM

## 2013-11-06 DIAGNOSIS — L719 Rosacea, unspecified: Secondary | ICD-10-CM

## 2013-11-06 DIAGNOSIS — E039 Hypothyroidism, unspecified: Secondary | ICD-10-CM

## 2013-11-06 LAB — POCT GLYCOSYLATED HEMOGLOBIN (HGB A1C): Hemoglobin A1C: 6

## 2013-11-06 MED ORDER — METRONIDAZOLE 0.75 % EX GEL: 1.0000 "application " | Freq: Two times a day (BID) | CUTANEOUS | Status: DC

## 2013-11-06 MED ORDER — TOLTERODINE TARTRATE ER 4 MG PO CP24: 4.0000 mg | ORAL_CAPSULE | Freq: Every day | ORAL | Status: DC

## 2013-11-06 NOTE — Progress Notes (Signed)
Subjective:    Patient ID: Joann Maddox, female    DOB: October 28, 1940, 73 y.o.   MRN: [id number]  HPI  Hypertension- Pt denies chest pain, SOB, dizziness, or heart palpitations.  Taking meds as directed w/o problems.  Denies medication side effects.    Impaired fasting - has dry mouth so does have inc thirst. No inc urination.   She's also been having a lot of pain in her right thumb from her rheumatoid arthritis. Her followup with her rheumatologist is in a few weeks. She's been using some of her husband's Voltaren gel but it does seem to work well.  She also complains of a few pimples and acne on her face. She says it occasionally breaks out.  Hypothyroidism-no recent skin or hair changes. No recent changes in weight or energy levels. Last thyroid level was normal. Review of Systems     BP 112/62  Pulse 64  Ht 5' (1.524 m)  Wt 212 lb (96.163 kg)  BMI 41.40 kg/m2    Allergies  Allergen Reactions  . Iodine Other (See Comments)    Shook violently and passed out.  . Codeine Other (See Comments)    Hallucinations  . Lipitor [Atorvastatin] Other (See Comments)    Myalgias    Past Medical History  Diagnosis Date  . Headache(784.0)     migrains  . Heart murmur   . Anxiety   . Hypothyroidism   . Rheumatoid arthritis(714.0)     Past Surgical History  Procedure Laterality Date  . Abdominal hysterectomy    . Appendectomy    . Cholecystectomy    . Tonsillectomy      as  a child  . Replacement total knee bilateral    . Total hip arthroplasty      right and left    History   Social History  . Marital Status: Married    Spouse Name: Dorene Sorrow    Number of Children: 2  . Years of Education: N/A   Occupational History  . Retired Emergency planning/management officer   Social History Main Topics  . Smoking status: Never Smoker   . Smokeless tobacco: Not on file  . Alcohol Use: No  . Drug Use:   . Sexual Activity:    Other Topics Concern  . Not on file   Social  History Narrative   No regular exercise. 2 caffeine drinks per day.     Family History  Problem Relation Age of Onset  . Heart disease Father 1  . Diabetes      aunt   . Stroke Mother 65    Outpatient Encounter Prescriptions as of 11/06/2013  Medication Sig  . butalbital-acetaminophen-caffeine (FIORICET, ESGIC) 50-325-40 MG per tablet Take 1 tablet by mouth 2 (two) times daily as needed.  Marland Kitchen escitalopram (LEXAPRO) 10 MG tablet TAKE 1 TABLET (10 MG TOTAL) BY MOUTH DAILY.  . folic acid (FOLVITE) 1 MG tablet TAKE 1 TABLET BY MOUTH EVERY DAY  . hydrochlorothiazide (HYDRODIURIL) 25 MG tablet TAKE 1 TABLET (25 MG TOTAL) BY MOUTH DAILY.  Marland Kitchen levothyroxine (SYNTHROID, LEVOTHROID) 25 MCG tablet TAKE 1 TABLET (25 MCG TOTAL) BY MOUTH DAILY.  Marland Kitchen Methotrexate Sodium, PF, 100 MG/4ML SOLN Inject 25 mg as directed once a week.  . Multiple Vitamin (MULTIVITAMIN) tablet Take 1 tablet by mouth daily.  . nadolol (CORGARD) 20 MG tablet TAKE 1 TABLET (20 MG TOTAL) BY MOUTH DAILY.  Marland Kitchen potassium chloride (K-DUR) 10 MEQ tablet Take  10 mEq by mouth as needed. Takes when takes lasix  . senna (SENOKOT) 8.6 MG tablet Take 1 tablet by mouth as needed.   . traMADol (ULTRAM) 50 MG tablet Take 100 mg by mouth every 8 (eight) hours.  . vitamin B-12 (CYANOCOBALAMIN) 100 MCG tablet Take 50 mcg by mouth daily.  . Vitamin D, Ergocalciferol, (DRISDOL) 50000 UNITS CAPS capsule TAKE 1 CAPSULE (50,000 UNITS TOTAL) BY MOUTH ONCE A WEEK.  . [DISCONTINUED] tolterodine (DETROL) 2 MG tablet Take 1 tablet (2 mg total) by mouth daily.  Marland Kitchen tolterodine (DETROL LA) 4 MG 24 hr capsule Take 1 capsule (4 mg total) by mouth daily.       Objective:   Physical Exam  Constitutional: She is oriented to person, place, and time. She appears well-developed and well-nourished.  HENT:  Head: Normocephalic and atraumatic.  Neck: Neck supple. No thyromegaly present.  Cardiovascular: Normal rate, regular rhythm and normal heart sounds.    Pulmonary/Chest: Effort normal and breath sounds normal.  Lymphadenopathy:    She has no cervical adenopathy.  Neurological: She is alert and oriented to person, place, and time.  Skin: Skin is warm and dry.  Psychiatric: She has a normal mood and affect. Her behavior is normal.          Assessment & Plan:  Hypertension-well-controlled. Continue current regimen. Follow up in 6 months. Check BMP today since she is on diuretic and takes potassium.  Impaired fasting glucose-hemoglobin A1c is 6.0 today. Stable. Followup in 6 months.  Discussed need for a tetanus vaccine today. She declines today as she is taking a trip out of town this week and is somewhat her on her period but says she will get it at the next office visit.  Right thumb pain-recommend that she discuss with her rheumatologist. She might even benefit from a steroid injection. I offered to get her own prescription for Voltaren gel but she said it was fine and she does use her husband's.  Rosacea-will treat with topical metronidazole.  Hypothyroidism-due to recheck TSH. We'll call the results once available.

## 2013-11-27 ENCOUNTER — Other Ambulatory Visit: Payer: Self-pay | Admitting: Family Medicine

## 2013-12-08 ENCOUNTER — Other Ambulatory Visit: Payer: Self-pay | Admitting: Family Medicine

## 2014-01-12 ENCOUNTER — Other Ambulatory Visit: Payer: Self-pay | Admitting: Family Medicine

## 2014-01-29 ENCOUNTER — Other Ambulatory Visit: Payer: Self-pay | Admitting: Family Medicine

## 2014-01-31 ENCOUNTER — Ambulatory Visit: Payer: MEDICARE | Admitting: Family Medicine

## 2014-02-01 ENCOUNTER — Other Ambulatory Visit: Payer: Self-pay | Admitting: Family Medicine

## 2014-02-07 ENCOUNTER — Other Ambulatory Visit: Payer: Self-pay | Admitting: Family Medicine

## 2014-02-19 ENCOUNTER — Encounter: Payer: Self-pay | Admitting: Family Medicine

## 2014-02-19 ENCOUNTER — Ambulatory Visit (INDEPENDENT_AMBULATORY_CARE_PROVIDER_SITE_OTHER): Payer: MEDICARE | Admitting: Family Medicine

## 2014-02-19 VITALS — BP 124/63 | HR 73 | Wt 220.0 lb

## 2014-02-19 DIAGNOSIS — R7301 Impaired fasting glucose: Secondary | ICD-10-CM

## 2014-02-19 DIAGNOSIS — K5900 Constipation, unspecified: Secondary | ICD-10-CM

## 2014-02-19 DIAGNOSIS — K5909 Other constipation: Secondary | ICD-10-CM

## 2014-02-19 DIAGNOSIS — R32 Unspecified urinary incontinence: Secondary | ICD-10-CM

## 2014-02-19 DIAGNOSIS — Z23 Encounter for immunization: Secondary | ICD-10-CM

## 2014-02-19 DIAGNOSIS — R3915 Urgency of urination: Secondary | ICD-10-CM

## 2014-02-19 DIAGNOSIS — R5383 Other fatigue: Secondary | ICD-10-CM

## 2014-02-19 LAB — POCT GLYCOSYLATED HEMOGLOBIN (HGB A1C): Hemoglobin A1C: 6

## 2014-02-19 LAB — POCT URINALYSIS DIPSTICK
Bilirubin, UA: NEGATIVE
Blood, UA: NEGATIVE
Glucose, UA: NEGATIVE
Ketones, UA: NEGATIVE
Leukocytes, UA: NEGATIVE
Nitrite, UA: NEGATIVE
Protein, UA: NEGATIVE
Spec Grav, UA: 1.015
Urobilinogen, UA: 0.2
pH, UA: 7.5

## 2014-02-19 MED ORDER — MIRABEGRON ER 25 MG PO TB24: 25.0000 mg | ORAL_TABLET | Freq: Every day | ORAL | Status: DC

## 2014-02-19 NOTE — Assessment & Plan Note (Signed)
Using 2 senokot at bedtime and works ok for [employer] most night.  Discussed there on Rx meds on the market if she wants to tyr them sometime. Encouraged plenty of water and inc fiber.

## 2014-02-19 NOTE — Progress Notes (Signed)
Subjective:    Patient ID: Joann Maddox, female    DOB: 01-11-1941, 73 y.o.   MRN: [id number]  HPI Having some urinary urgency bu then when goes doesn't get alo tout. No back or side pain.  No hematuria.  No fever, chills ore sweats. Has been gong on for months. She does have incontinence at night.  On the Detrol LA 4 mg.  Has had dry mouth and constipation on the medication. She has to senokot 2 tabs at night.   C/o extreme fatigue over the last few months. Says normal thinks she is energetic and happy go lucky but just fels like everything is an effort. She is sleeping well.  She has gained a lot of weight in a short period of time.  No hair changes.  No recent skin changes.    Review of Systems     BP 124/63  Pulse 73  Wt 220 lb (99.791 kg)    Allergies  Allergen Reactions  . Iodine Other (See Comments)    Shook violently and passed out.  . Codeine Other (See Comments)    Hallucinations  . Lipitor [Atorvastatin] Other (See Comments)    Myalgias    Past Medical History  Diagnosis Date  . Headache(784.0)     migrains  . Heart murmur   . Anxiety   . Hypothyroidism   . Rheumatoid arthritis(714.0)     Past Surgical History  Procedure Laterality Date  . Abdominal hysterectomy    . Appendectomy    . Cholecystectomy    . Tonsillectomy      as  a child  . Replacement total knee bilateral    . Total hip arthroplasty      right and left    History   Social History  . Marital Status: Married    Spouse Name: Dorene Sorrow    Number of Children: 2  . Years of Education: N/A   Occupational History  . Retired Emergency planning/management officer   Social History Main Topics  . Smoking status: Never Smoker   . Smokeless tobacco: Not on file  . Alcohol Use: No  . Drug Use:   . Sexual Activity:    Other Topics Concern  . Not on file   Social History Narrative   No regular exercise. 2 caffeine drinks per day.     Family History  Problem Relation Age of Onset  . Heart  disease Father 60  . Diabetes      aunt   . Stroke Mother 37    Outpatient Encounter Prescriptions as of 02/19/2014  Medication Sig  . butalbital-acetaminophen-caffeine (FIORICET, ESGIC) 50-325-40 MG per tablet Take 1 tablet by mouth 2 (two) times daily as needed.  Marland Kitchen escitalopram (LEXAPRO) 10 MG tablet TAKE 1 TABLET (10 MG TOTAL) BY MOUTH DAILY.  . folic acid (FOLVITE) 1 MG tablet TAKE 1 TABLET BY MOUTH EVERY DAY  . hydrochlorothiazide (HYDRODIURIL) 25 MG tablet TAKE 1 TABLET (25 MG TOTAL) BY MOUTH DAILY.  Marland Kitchen levothyroxine (SYNTHROID, LEVOTHROID) 25 MCG tablet TAKE 1 TABLET (25 MCG TOTAL) BY MOUTH DAILY.  Marland Kitchen Methotrexate Sodium, PF, 100 MG/4ML SOLN Inject 25 mg as directed once a week.  . metroNIDAZOLE (METROGEL) 0.75 % gel Apply 1 application topically 2 (two) times daily. To face  . Multiple Vitamin (MULTIVITAMIN) tablet Take 1 tablet by mouth daily.  . nadolol (CORGARD) 20 MG tablet TAKE 1 TABLET (20 MG TOTAL) BY MOUTH DAILY.  Marland Kitchen potassium  chloride (K-DUR) 10 MEQ tablet Take 10 mEq by mouth as needed. Takes when takes lasix  . senna (SENOKOT) 8.6 MG tablet Take 1 tablet by mouth as needed.   . tolterodine (DETROL LA) 4 MG 24 hr capsule TAKE 1 CAPSULE (4 MG TOTAL) BY MOUTH DAILY.  . traMADol (ULTRAM) 50 MG tablet Take 100 mg by mouth every 8 (eight) hours.  . vitamin B-12 (CYANOCOBALAMIN) 100 MCG tablet Take 50 mcg by mouth daily.  . Vitamin D, Ergocalciferol, (DRISDOL) 50000 UNITS CAPS capsule TAKE 1 CAPSULE (50,000 UNITS TOTAL) BY MOUTH ONCE A WEEK.  . mirabegron ER (MYRBETRIQ) 25 MG TB24 tablet Take 1 tablet (25 mg total) by mouth daily.  . [DISCONTINUED] KLOR-CON M10 10 MEQ tablet TAKE 1 TABLET (10 MEQ TOTAL) BY MOUTH 2 (TWO) TIMES DAILY.       Objective:   Physical Exam  Constitutional: She is oriented to person, place, and time. She appears well-developed and well-nourished.  HENT:  Head: Normocephalic and atraumatic.  Right Ear: External ear normal.  Left Ear: External ear  normal.  Nose: Nose normal.  Mouth/Throat: Oropharynx is clear and moist.  TMs and canals are clear.   Eyes: Conjunctivae and EOM are normal. Pupils are equal, round, and reactive to light.  Neck: Neck supple. No thyromegaly present.  thyorid was mildly tender on exam today.   Cardiovascular: Normal rate, regular rhythm and normal heart sounds.   Pulmonary/Chest: Effort normal and breath sounds normal. She has no wheezes.  Abdominal: Soft. Bowel sounds are normal. She exhibits no distension and no mass. There is no tenderness. There is no rebound and no guarding.  Musculoskeletal: She exhibits no edema.  Lymphadenopathy:    She has no cervical adenopathy.  Neurological: She is alert and oriented to person, place, and time.  Skin: Skin is warm and dry.  Psychiatric: She has a normal mood and affect.          Assessment & Plan:  Urinary urgency and incontinence-will discontinue the Detrol LA and try Myrbetrek. It has a little bit better side effect profile so this may improve the dry mouth and constipation that she's been experiencing. If too expensive and please call his back. Follow-up in about 6 weeks to make sure that she is doing well and to adjust the dose if needed.  Extreme fatigue-unclear etiology. To some basic blood work to rule out thyroid disorder and anemia. She takes a vitamin D daily so I did not check for vitamin D deficiency.  Chronic constipation-did discuss that there are some prescription options on the market but of 2 Senokot at bedtime seems to work well for her overall encourage her to continue that regimen.  Hypothyroidism-due to recheck TSH level. She steadily having a lot of symptoms consistent with inadequately treated thyroid.

## 2014-02-20 LAB — BASIC METABOLIC PANEL WITH GFR
BUN: 16 mg/dL (ref 6–23)
CO2: 31 mEq/L (ref 19–32)
Calcium: 9.4 mg/dL (ref 8.4–10.5)
Chloride: 100 mEq/L (ref 96–112)
Creat: 0.59 mg/dL (ref 0.50–1.10)
GFR, Est African American: >89 mL/min
GFR, Est Non African American: >89 mL/min
Glucose, Bld: 122 mg/dL — ABNORMAL HIGH (ref 70–99)
Potassium: 4.6 mEq/L (ref 3.5–5.3)
Sodium: 139 mEq/L (ref 135–145)

## 2014-02-20 LAB — CBC
HCT: 36.4 % (ref 36.0–46.0)
Hemoglobin: 12.3 g/dL (ref 12.0–15.0)
MCH: 31.2 pg (ref 26.0–34.0)
MCHC: 33.8 g/dL (ref 30.0–36.0)
MCV: 92.4 fL (ref 78.0–100.0)
Platelets: 268 10*3/uL (ref 150–400)
RBC: 3.94 MIL/uL (ref 3.87–5.11)
RDW: 15.4 % (ref 11.5–15.5)
WBC: 7 10*3/uL (ref 4.0–10.5)

## 2014-02-20 LAB — VITAMIN B12: Vitamin B-12: 1099 pg/mL — ABNORMAL HIGH (ref 211–911)

## 2014-02-20 LAB — FERRITIN: Ferritin: 91 ng/mL (ref 10–291)

## 2014-02-20 LAB — FOLATE: Folate: >20 ng/mL

## 2014-02-20 LAB — TSH: TSH: 0.517 u[IU]/mL (ref 0.350–4.500)

## 2014-02-21 ENCOUNTER — Telehealth: Payer: Self-pay | Admitting: *Deleted

## 2014-02-21 LAB — URINE CULTURE: Colony Count: 75000

## 2014-02-21 NOTE — Telephone Encounter (Signed)
Myrbetriq prior auth approval [id number] 02/20/14-1027/16. Patient and pharmacy aware. Corliss Skains, RMA

## 2014-02-22 ENCOUNTER — Other Ambulatory Visit: Payer: Self-pay | Admitting: *Deleted

## 2014-02-22 DIAGNOSIS — R32 Unspecified urinary incontinence: Secondary | ICD-10-CM

## 2014-02-26 ENCOUNTER — Other Ambulatory Visit: Payer: Self-pay | Admitting: Family Medicine

## 2014-02-27 ENCOUNTER — Other Ambulatory Visit: Payer: Self-pay | Admitting: Family Medicine

## 2014-02-27 LAB — URINE CULTURE: Colony Count: 70000

## 2014-02-27 MED ORDER — LEVOFLOXACIN 250 MG PO TABS: 250.0000 mg | ORAL_TABLET | Freq: Every day | ORAL | Status: DC

## 2014-03-05 ENCOUNTER — Telehealth: Payer: Self-pay

## 2014-03-05 NOTE — Telephone Encounter (Signed)
Joann Maddox called stating she is still having problems with urination. I could not get more details due to the fact the call was lost/ended. I was unable to reach patient on the phone. I left a message for her to return call.

## 2014-03-05 NOTE — Telephone Encounter (Signed)
Scheduled patient for Friday. Advised her to call sooner if she gets worse or if she has new symptoms.

## 2014-03-09 ENCOUNTER — Ambulatory Visit (INDEPENDENT_AMBULATORY_CARE_PROVIDER_SITE_OTHER): Payer: MEDICARE | Admitting: Family Medicine

## 2014-03-09 ENCOUNTER — Encounter: Payer: Self-pay | Admitting: Family Medicine

## 2014-03-09 VITALS — BP 124/70 | HR 68 | Temp 98.0°F | Ht 60.0 in | Wt 217.0 lb

## 2014-03-09 DIAGNOSIS — R600 Localized edema: Secondary | ICD-10-CM

## 2014-03-09 LAB — POCT URINALYSIS DIPSTICK
Bilirubin, UA: NEGATIVE
Blood, UA: NEGATIVE
Glucose, UA: NEGATIVE
Ketones, UA: NEGATIVE
Leukocytes, UA: NEGATIVE
Nitrite, UA: NEGATIVE
Protein, UA: NEGATIVE
Spec Grav, UA: 1.02
Urobilinogen, UA: 0.2
pH, UA: 7

## 2014-03-09 NOTE — Addendum Note (Signed)
Addended by: Deno Etienne on: 03/09/2014 11:56 AM   Modules accepted: Orders

## 2014-03-09 NOTE — Patient Instructions (Signed)
Decrease salt intake to no more than 2000mg  a day Keep legs elevated when can.  Increase hard-core thiazide to 2 tabs daily for 3 days to help remove excess fluid.

## 2014-03-09 NOTE — Progress Notes (Addendum)
Subjective:    Patient ID: Joann Maddox, female    DOB: Feb 21, 1941, 73 y.o.   MRN: [id number]  HPI Bilateral foot swelling - bilateral x 1 wk has gotten somewhat better since she called to schedule the appt. Left has been worse.  No recent change in activity.  NO diet intake changes like salt. Has been urinating normally.  No CP or SOB. No palpitations.  Thyroid was normal on labs 2 weeks ago. She feels that the swelling is actually a little bit better today compared to a couple of days ago. No trauma or injury.  Says feels like her hand are weak. She has been dropping things.  Had a lot of pain in her hands as well.  She has RA.    Review of Systems     BP 124/70 mmHg  Pulse 68  Temp(Src) 98 F (36.7 C)  Ht 5' (1.524 m)  Wt 217 lb (98.431 kg)  BMI 42.38 kg/m2  SpO2 98%    Allergies  Allergen Reactions  . Iodine Other (See Comments)    Shook violently and passed out.  . Codeine Other (See Comments)    Hallucinations  . Lipitor [Atorvastatin] Other (See Comments)    Myalgias    Past Medical History  Diagnosis Date  . Headache(784.0)     migrains  . Heart murmur   . Anxiety   . Hypothyroidism   . Rheumatoid arthritis(714.0)     Past Surgical History  Procedure Laterality Date  . Abdominal hysterectomy    . Appendectomy    . Cholecystectomy    . Tonsillectomy      as  a child  . Replacement total knee bilateral    . Total hip arthroplasty      right and left    History   Social History  . Marital Status: Married    Spouse Name: Dorene Sorrow    Number of Children: 2  . Years of Education: N/A   Occupational History  . Retired Emergency planning/management officer   Social History Main Topics  . Smoking status: Never Smoker   . Smokeless tobacco: Not on file  . Alcohol Use: No  . Drug Use: Not on file  . Sexual Activity: Not on file   Other Topics Concern  . Not on file   Social History Narrative   No regular exercise. 2 caffeine drinks per day.      Family History  Problem Relation Age of Onset  . Heart disease Father 15  . Diabetes      aunt   . Stroke Mother 11    Outpatient Encounter Prescriptions as of 03/09/2014  Medication Sig  . butalbital-acetaminophen-caffeine (FIORICET, ESGIC) 50-325-40 MG per tablet Take 1 tablet by mouth 2 (two) times daily as needed.  Marland Kitchen escitalopram (LEXAPRO) 10 MG tablet TAKE 1 TABLET (10 MG TOTAL) BY MOUTH DAILY.  . folic acid (FOLVITE) 1 MG tablet TAKE 1 TABLET BY MOUTH EVERY DAY  . hydrochlorothiazide (HYDRODIURIL) 25 MG tablet TAKE 1 TABLET (25 MG TOTAL) BY MOUTH DAILY.  Marland Kitchen levothyroxine (SYNTHROID, LEVOTHROID) 25 MCG tablet TAKE 1 TABLET (25 MCG TOTAL) BY MOUTH DAILY.  Marland Kitchen Methotrexate Sodium, PF, 100 MG/4ML SOLN Inject 25 mg as directed once a week.  . metroNIDAZOLE (METROGEL) 0.75 % gel Apply 1 application topically 2 (two) times daily. To face  . mirabegron ER (MYRBETRIQ) 25 MG TB24 tablet Take 1 tablet (25 mg total) by mouth daily.  Marland Kitchen  Multiple Vitamin (MULTIVITAMIN) tablet Take 1 tablet by mouth daily.  . nadolol (CORGARD) 20 MG tablet TAKE 1 TABLET (20 MG TOTAL) BY MOUTH DAILY.  Marland Kitchen potassium chloride (K-DUR) 10 MEQ tablet Take 10 mEq by mouth as needed. Takes when takes lasix  . senna (SENOKOT) 8.6 MG tablet Take 1 tablet by mouth as needed.   . traMADol (ULTRAM) 50 MG tablet Take 100 mg by mouth every 8 (eight) hours.  . vitamin B-12 (CYANOCOBALAMIN) 100 MCG tablet Take 50 mcg by mouth daily.  . Vitamin D, Ergocalciferol, (DRISDOL) 50000 UNITS CAPS capsule TAKE 1 CAPSULE (50,000 UNITS TOTAL) BY MOUTH ONCE A WEEK.  . [DISCONTINUED] levofloxacin (LEVAQUIN) 250 MG tablet Take 1 tablet (250 mg total) by mouth daily.  . [DISCONTINUED] tolterodine (DETROL LA) 4 MG 24 hr capsule TAKE 1 CAPSULE (4 MG TOTAL) BY MOUTH DAILY.       Objective:   Physical Exam  Constitutional: She is oriented to person, place, and time. She appears well-developed and well-nourished.  HENT:  Head: Normocephalic  and atraumatic.  Cardiovascular: Normal rate, regular rhythm and normal heart sounds.   Pulmonary/Chest: Effort normal and breath sounds normal.  Musculoskeletal:  Left foot 2+ pitting edema. Left foot 1+ pitting edema.   Neurological: She is alert and oriented to person, place, and time.  Skin: Skin is warm and dry.  Psychiatric: She has a normal mood and affect. Her behavior is normal.          Assessment & Plan:  Bilat LE edema - unclear etiology at this point. We just checked her thyroid and kidney function about 2 and half weeks ago and it looked fantastic. She denies any major changes in activity your diet. But certainly some increase in salt intake could have exacerbated her swelling. She is also having a flare of her rheumatoid and her left hand today. Will do a urinalysis to check for protein and rule out proteinuria as a potential cause. Discuss the importance of low-salt diet. She can also increase her HCTZ to 2 tabs daily for 3 days to help get some extra fluid off.  Heart examine normal. No signs of congestive heart failure. No shortness of breath or chest pain with it.

## 2014-03-12 ENCOUNTER — Telehealth: Payer: Self-pay | Admitting: *Deleted

## 2014-03-12 NOTE — Telephone Encounter (Signed)
Pt called and told me that the myrbetriq 25 mg is not working effectively for her and wanted to know if it would be ok for her to take 2 tabs, or to go back on the detrol.  If not she would like something called into her pharmacy TODAY to prevent any accidents when she goes to bed tonight. Fwd to Dr. Linford Arnold for advice.Joann Maddox

## 2014-03-13 NOTE — Telephone Encounter (Signed)
Pt advised that she can take 2 tabs for the next few days and if this works well for her to call back and a new rx will be sent.Laureen Ochs, Viann Shove

## 2014-03-13 NOTE — Telephone Encounter (Signed)
Yes, try 2 tabs for a few days.  It does come in a 50mg  strength on the Myrbetriq.

## 2014-03-16 ENCOUNTER — Other Ambulatory Visit: Payer: Self-pay | Admitting: Family Medicine

## 2014-04-22 ENCOUNTER — Other Ambulatory Visit: Payer: Self-pay | Admitting: Family Medicine

## 2014-04-30 ENCOUNTER — Ambulatory Visit: Payer: MEDICARE | Admitting: Family Medicine

## 2014-05-06 ENCOUNTER — Other Ambulatory Visit: Payer: Self-pay | Admitting: Family Medicine

## 2014-05-09 ENCOUNTER — Ambulatory Visit (INDEPENDENT_AMBULATORY_CARE_PROVIDER_SITE_OTHER): Payer: Medicare Other | Admitting: Family Medicine

## 2014-05-09 ENCOUNTER — Encounter: Payer: Self-pay | Admitting: Family Medicine

## 2014-05-09 VITALS — BP 126/58 | HR 60 | Ht 60.0 in | Wt 218.0 lb

## 2014-05-09 DIAGNOSIS — R7301 Impaired fasting glucose: Secondary | ICD-10-CM | POA: Insufficient documentation

## 2014-05-09 DIAGNOSIS — I10 Essential (primary) hypertension: Secondary | ICD-10-CM

## 2014-05-09 DIAGNOSIS — L719 Rosacea, unspecified: Secondary | ICD-10-CM

## 2014-05-09 LAB — POCT GLYCOSYLATED HEMOGLOBIN (HGB A1C): Hemoglobin A1C: 6.4

## 2014-05-09 MED ORDER — IVERMECTIN 1 % EX CREA: 1.0000 "application " | TOPICAL_CREAM | Freq: Every day | CUTANEOUS | Status: DC

## 2014-05-09 NOTE — Progress Notes (Signed)
   Subjective:    Patient ID: Joann Maddox, female    DOB: 1940/07/13, 74 y.o.   MRN: [id number]  HPI Hypertension- Pt denies chest pain, SOB, dizziness, or heart palpitations.  Taking meds as directed w/o problems.  Denies medication side effects.  Taking atorvastatin 25 mg and nadolol 20 mg. Really watching her salt.   Impaired fasting glucose-no increased thirst or urination. She does have dry mouth.  Uses biotin.  Lab Results  Component Value Date   HGBA1C 6.4 05/09/2014   Rosacea - says has been using the metrogel but feels is drying and has been having breakouts like she is a teenager. She has been more stressed recently. No change in make-up, etc.   Feels like her balance is off. Says she is back to using her cane again.  Says fell getting into the shower last week. Says having trouble with leg strength. Feels like her arthritis has been flaring the last few weeks.   Review of Systems     Objective:   Physical Exam  Constitutional: She is oriented to person, place, and time. She appears well-developed and well-nourished.  HENT:  Head: Normocephalic and atraumatic.  Cardiovascular: Normal rate, regular rhythm and normal heart sounds.   Pulmonary/Chest: Effort normal and breath sounds normal.  Neurological: She is alert and oriented to person, place, and time.  Skin: Skin is warm and dry.  She does have a few red papules on her right cheek. She has a fair mom makeup on today so it's difficult to see if she has any redness.  Psychiatric: She has a normal mood and affect. Her behavior is normal.          Assessment & Plan:  Hypertension- well controlled.  F/U in 6 months.   Impaired fasting glucose- A1C is up 6.4.   Discussing points of getting back on track with diet and exercise. Follow-up in 3 months.  Rosacea - will try Soolantra. If too costly then let me know.   Due for Tdap and bone density. She wants to wait until next time.

## 2014-05-15 ENCOUNTER — Other Ambulatory Visit: Payer: Self-pay | Admitting: Family Medicine

## 2014-05-17 ENCOUNTER — Telehealth: Payer: Self-pay

## 2014-05-17 NOTE — Telephone Encounter (Signed)
Patient called and left a message stating the cost of Joann Maddox is too much. Please advise.

## 2014-05-21 MED ORDER — AZELAIC ACID 20 % EX CREA: TOPICAL_CREAM | Freq: Two times a day (BID) | CUTANEOUS | Status: DC

## 2014-05-21 NOTE — Telephone Encounter (Signed)
OK will send over new rx for Azelaic acid topical.

## 2014-05-22 NOTE — Telephone Encounter (Signed)
Patient advised.

## 2014-05-29 ENCOUNTER — Other Ambulatory Visit: Payer: Self-pay | Admitting: Family Medicine

## 2014-05-30 NOTE — Telephone Encounter (Signed)
Will be due for labs around April

## 2014-06-08 ENCOUNTER — Other Ambulatory Visit: Payer: Self-pay | Admitting: Family Medicine

## 2014-06-14 ENCOUNTER — Other Ambulatory Visit: Payer: Self-pay | Admitting: Family Medicine

## 2014-07-06 ENCOUNTER — Telehealth: Payer: Self-pay

## 2014-07-06 ENCOUNTER — Other Ambulatory Visit: Payer: Self-pay | Admitting: Family Medicine

## 2014-07-06 NOTE — Telephone Encounter (Signed)
Joann Maddox complains of bilateral foot swelling for a few days. Denies shortness of breath or chest pains. She asked if she could take 2 HCTZ tablets as directed before by Dr Linford Arnold. I told her she could increase to 2 tablets daily for 3 days and to also decrease salt intake, elevated feet as much as possible and follow up on Monday if no improvement.

## 2014-07-06 NOTE — Telephone Encounter (Signed)
Agree with care plan.  

## 2014-07-09 ENCOUNTER — Ambulatory Visit: Payer: Medicare Other | Admitting: Family Medicine

## 2014-07-21 ENCOUNTER — Other Ambulatory Visit: Payer: Self-pay | Admitting: Family Medicine

## 2014-07-24 NOTE — Telephone Encounter (Signed)
Pt pharmacy requested refill. This Rx has been entered by a historical provider. Please advise if Rx is appropriate.

## 2014-07-27 ENCOUNTER — Other Ambulatory Visit: Payer: Self-pay | Admitting: Family Medicine

## 2014-07-30 ENCOUNTER — Other Ambulatory Visit: Payer: Self-pay | Admitting: Family Medicine

## 2014-07-31 ENCOUNTER — Telehealth: Payer: Self-pay | Admitting: *Deleted

## 2014-07-31 NOTE — Telephone Encounter (Signed)
Pt left vm asking for a refill on her tramadol.  Are you ok with this? Please advise.

## 2014-08-01 ENCOUNTER — Other Ambulatory Visit: Payer: Self-pay | Admitting: *Deleted

## 2014-08-01 MED ORDER — TRAMADOL HCL 50 MG PO TABS: 100.0000 mg | ORAL_TABLET | Freq: Three times a day (TID) | ORAL | Status: DC

## 2014-08-01 NOTE — Telephone Encounter (Signed)
Pt called again about refill on tramadol. She is in a lot of pain.

## 2014-08-01 NOTE — Telephone Encounter (Signed)
Tramadol refilled.  Pt notified.

## 2014-08-01 NOTE — Telephone Encounter (Signed)
Ok to refill 

## 2014-08-06 LAB — BASIC METABOLIC PANEL
BUN: 18 mg/dL (ref 4–21)
Creatinine: 0.7 mg/dL (ref 0.5–1.1)
Glucose: 84 mg/dL
Potassium: 4.6 mmol/L (ref 3.4–5.3)
Sodium: 139 mmol/L (ref 137–147)

## 2014-08-06 LAB — LIPID PANEL: LDl/HDL Ratio: 6.2

## 2014-08-06 LAB — HEPATIC FUNCTION PANEL
ALT: 35 U/L (ref 7–35)
AST: 26 U/L (ref 13–35)
Alkaline Phosphatase: 103 U/L (ref 25–125)

## 2014-08-06 LAB — CBC AND DIFFERENTIAL
HCT: 39 % (ref 36–46)
Hemoglobin: 13 g/dL (ref 12.0–16.0)
Platelets: 233 10*3/uL (ref 150–399)

## 2014-08-09 ENCOUNTER — Encounter: Payer: Self-pay | Admitting: Family Medicine

## 2014-08-09 ENCOUNTER — Ambulatory Visit (INDEPENDENT_AMBULATORY_CARE_PROVIDER_SITE_OTHER): Payer: Medicare Other | Admitting: Family Medicine

## 2014-08-09 VITALS — BP 138/74 | HR 58 | Wt 218.0 lb

## 2014-08-09 DIAGNOSIS — E119 Type 2 diabetes mellitus without complications: Secondary | ICD-10-CM | POA: Diagnosis not present

## 2014-08-09 DIAGNOSIS — I10 Essential (primary) hypertension: Secondary | ICD-10-CM

## 2014-08-09 DIAGNOSIS — M069 Rheumatoid arthritis, unspecified: Secondary | ICD-10-CM

## 2014-08-09 DIAGNOSIS — R7301 Impaired fasting glucose: Secondary | ICD-10-CM

## 2014-08-09 DIAGNOSIS — R5383 Other fatigue: Secondary | ICD-10-CM

## 2014-08-09 LAB — POCT GLYCOSYLATED HEMOGLOBIN (HGB A1C): Hemoglobin A1C: 6.8

## 2014-08-09 MED ORDER — TRAMADOL HCL 50 MG PO TABS: 50.0000 mg | ORAL_TABLET | Freq: Two times a day (BID) | ORAL | Status: DC | PRN

## 2014-08-09 MED ORDER — DULOXETINE HCL 30 MG PO CPEP: 30.0000 mg | ORAL_CAPSULE | Freq: Two times a day (BID) | ORAL | Status: DC

## 2014-08-09 NOTE — Patient Instructions (Signed)
Cut Lexapro in half. Take half a tab daily for 3 days and then stop. Start the Cymbalta the following day at 30 mg once a day. After one week, increase to twice a day.

## 2014-08-09 NOTE — Progress Notes (Signed)
   Subjective:    Patient ID: Joann Maddox, female    DOB: 11-24-1940, 74 y.o.   MRN: [id number]  HPI Hypertension- Pt denies chest pain, SOB, dizziness, or heart palpitations.  Taking meds as directed w/o problems.  Denies medication side effects.    Diabetes - no hypoglycemic events. No wounds or sores that are not healing well. No increased thirst or urination. Checking glucose at home. Taking medications as prescribed without any side effects.  Rheumatoid arthritis-Swelling in her hands and feet x 1 month. Says they look good today.  Having a lot of pain in her hands.  Says th etramadol really helps when she is in a lot of pain. She is taking it mostly for her arthritis. Takes 100mg  Twice a day as needed. Usually gets 120 per month.   She is on methotrexate injection weekly.  Her tramadol was being prescribed by her prior PCP. We only written for this medication recently and she is asking that I take over the prescription for her. She says she takes between 4-6 tabs some days.  She also just feels extremely fatigued. She says she just feels like she has no energy and has felt this way for months. In fact we did some blood work recently to rule out thyroid problems anemia etc. and blood work actually look pretty good.  Review of Systems     Objective:   Physical Exam  Constitutional: She is oriented to person, place, and time. She appears well-developed and well-nourished.  HENT:  Head: Normocephalic and atraumatic.  Cardiovascular: Normal rate, regular rhythm and normal heart sounds.   Pulmonary/Chest: Effort normal and breath sounds normal.  Neurological: She is alert and oriented to person, place, and time.  Skin: Skin is warm and dry.  Psychiatric: She has a normal mood and affect. Her behavior is normal.          Assessment & Plan:  HTN- initially elevated but on repeat blood pressure is at goal. Continue current regimen. Follow-up in 3 months.  DM- Well controlled. A1C  is up to 6.8 from 6.4.  Still at goal. Discussed making sure that she's watching her diet and trying to get some exercise. Follow-up in 3 months.  RA - she is considering plaquenil. Recommend talk to her new eye doctor just to make sure that states she does have a history of retinal problems. I think this may be a good option person she has not done well with several of the newer Biologics and it sounds like she's not being maximally controlled on just her methotrexate.. I did go ahead and fill her tramadol. She says some days she takes up to 6 tabs in one day and I explained her that I think that that's too much. Increases risk of seizure. Encouraged her to take no more than 4 tabs in one day.  Fatigue - she feels like it's mostly from her pain. We'll consider discontinuing Lexapro and start Cymbalta instead. She says she's willing to try it. We'll follow-up in one month. I think this is a very reasonable option and could help with her overall daily musculoskeletal pain from her rheumatoid arthritis.

## 2014-08-10 ENCOUNTER — Encounter: Payer: Self-pay | Admitting: Family Medicine

## 2014-08-10 DIAGNOSIS — E118 Type 2 diabetes mellitus with unspecified complications: Secondary | ICD-10-CM | POA: Insufficient documentation

## 2014-08-10 DIAGNOSIS — E119 Type 2 diabetes mellitus without complications: Secondary | ICD-10-CM

## 2014-08-10 LAB — LIPID PANEL
Cholesterol: 169 mg/dL (ref 0–200)
HDL: 50 mg/dL (ref >=46)
LDL Cholesterol: 99 mg/dL (ref 0–99)
Total CHOL/HDL Ratio: 3.4 Ratio
Triglycerides: 100 mg/dL (ref <150)
VLDL: 20 mg/dL (ref 0–40)

## 2014-08-10 LAB — COMPLETE METABOLIC PANEL WITH GFR
ALT: 20 U/L (ref 0–35)
AST: 25 U/L (ref 0–37)
Albumin: 3.8 g/dL (ref 3.5–5.2)
Alkaline Phosphatase: 77 U/L (ref 39–117)
BUN: 13 mg/dL (ref 6–23)
CO2: 32 mEq/L (ref 19–32)
Calcium: 9.5 mg/dL (ref 8.4–10.5)
Chloride: 102 mEq/L (ref 96–112)
Creat: 0.55 mg/dL (ref 0.50–1.10)
GFR, Est African American: >89 mL/min
GFR, Est Non African American: >89 mL/min
Glucose, Bld: 71 mg/dL (ref 70–99)
Potassium: 4.2 mEq/L (ref 3.5–5.3)
Sodium: 137 mEq/L (ref 135–145)
Total Bilirubin: 0.6 mg/dL (ref 0.2–1.2)
Total Protein: 6.2 g/dL (ref 6.0–8.3)

## 2014-08-10 NOTE — Progress Notes (Signed)
Quick Note:  All labs are normal. ______ 

## 2014-08-22 ENCOUNTER — Other Ambulatory Visit: Payer: Self-pay | Admitting: Family Medicine

## 2014-08-29 ENCOUNTER — Encounter: Payer: Self-pay | Admitting: Family Medicine

## 2014-09-06 ENCOUNTER — Encounter: Payer: Self-pay | Admitting: Family Medicine

## 2014-09-06 ENCOUNTER — Ambulatory Visit (INDEPENDENT_AMBULATORY_CARE_PROVIDER_SITE_OTHER): Payer: Medicare Other | Admitting: Family Medicine

## 2014-09-06 VITALS — BP 111/60 | HR 71 | Ht 60.0 in | Wt 214.0 lb

## 2014-09-06 DIAGNOSIS — F411 Generalized anxiety disorder: Secondary | ICD-10-CM

## 2014-09-06 DIAGNOSIS — M069 Rheumatoid arthritis, unspecified: Secondary | ICD-10-CM

## 2014-09-06 MED ORDER — DULOXETINE HCL 60 MG PO CPEP: 60.0000 mg | ORAL_CAPSULE | Freq: Every day | ORAL | Status: DC

## 2014-09-06 NOTE — Progress Notes (Signed)
   Subjective:    Patient ID: Joann Maddox, female    DOB: 25-Aug-1940, 74 y.o.   MRN: [id number]  HPI F/U mood - We had decided ot switch her lexapro to Cymbalta.  She feels like as far as mood is concerned it's just as effective as the Lexapro. She's not had any side effects or concerns in the medication. She's now taking 60 mg. And she has noticed a little bit of reduction in her overall pain which is fantastic. Next  Rheumatoid arthritis-she admits she's only been taking her Plaquenil once a day instead of twice a day. She is just extremely worried about the potential for eye complications. She still doing her methotrexate injections weekly.  She would like copy of recentl abs. All looked great.    Review of Systems     Objective:   Physical Exam  Constitutional: She is oriented to person, place, and time. She appears well-developed and well-nourished.  HENT:  Head: Normocephalic and atraumatic.  Cardiovascular: Normal rate, regular rhythm and normal heart sounds.   Pulmonary/Chest: Effort normal and breath sounds normal.  Neurological: She is alert and oriented to person, place, and time.  Skin: Skin is warm and dry.  Psychiatric: She has a normal mood and affect. Her behavior is normal.          Assessment & Plan:  Mood-well-controlled on current regimen and she is getting some pain relief with her rheumatoid. Continue current dose and regimen. She seems happy with this switch.  Rheumatoid arthritis-encouraged her to consider retrying thae BID  dosing on the Plaquenil as directed by her rheumatologist. I know she is very worried about the potential for eye complications but encouraged her that as long as she is going for regular checkups they can catch anything early that might be causing a problem. Next  HTN - BP looks aweseome today!   Copy of recent labwork provided.

## 2014-09-27 ENCOUNTER — Other Ambulatory Visit: Payer: Self-pay | Admitting: Family Medicine

## 2014-10-01 ENCOUNTER — Other Ambulatory Visit: Payer: Self-pay | Admitting: Family Medicine

## 2014-10-22 ENCOUNTER — Other Ambulatory Visit: Payer: Self-pay

## 2014-10-25 ENCOUNTER — Ambulatory Visit: Payer: Medicare Other | Admitting: Sports Medicine

## 2014-10-27 ENCOUNTER — Other Ambulatory Visit: Payer: Self-pay | Admitting: Family Medicine

## 2014-11-06 ENCOUNTER — Ambulatory Visit: Payer: Medicare Other | Admitting: Family Medicine

## 2014-11-12 ENCOUNTER — Telehealth: Payer: Self-pay | Admitting: Family Medicine

## 2014-11-12 ENCOUNTER — Ambulatory Visit (INDEPENDENT_AMBULATORY_CARE_PROVIDER_SITE_OTHER): Payer: Medicare Other | Admitting: Family Medicine

## 2014-11-12 ENCOUNTER — Encounter: Payer: Self-pay | Admitting: Family Medicine

## 2014-11-12 VITALS — BP 112/54 | HR 66 | Ht 60.0 in | Wt 220.0 lb

## 2014-11-12 DIAGNOSIS — G4700 Insomnia, unspecified: Secondary | ICD-10-CM | POA: Diagnosis not present

## 2014-11-12 DIAGNOSIS — I10 Essential (primary) hypertension: Secondary | ICD-10-CM | POA: Diagnosis not present

## 2014-11-12 DIAGNOSIS — E119 Type 2 diabetes mellitus without complications: Secondary | ICD-10-CM | POA: Diagnosis not present

## 2014-11-12 LAB — POCT GLYCOSYLATED HEMOGLOBIN (HGB A1C): Hemoglobin A1C: 6

## 2014-11-12 MED ORDER — METFORMIN HCL 500 MG PO TABS: 500.0000 mg | ORAL_TABLET | Freq: Two times a day (BID) | ORAL | Status: DC

## 2014-11-12 NOTE — Progress Notes (Signed)
   Subjective:    Patient ID: Joann Maddox, female    DOB: [birth year], 74 y.o.   MRN: [id number]  HPI Hypertension- Pt denies chest pain, SOB, dizziness, or heart palpitations.  Taking meds as directed w/o problems.  Denies medication side effects.  Wants to know if still need to take her lasix with hctz. Was taking it when she used her lasix  Before.   BMI greater than 40- she has gained 6 lbs back.   Diabetes - no hypoglycemic events. No wounds or sores that are not healing well. No increased thirst or urination. Checking glucose at home. Taking medications as prescribed without any side effects. She went on vacation for 2 weeks and feels like her A1C is going to up.    Says can't sleep well bc of discomfort and pain. Often sleeps sitting up. She says she only occasionally snores if she's just really exhausted. She says mostly she just lies on her bed and can get her shoulders back etc. comfortable enough to fall asleep.    Review of Systems     Objective:   Physical Exam  Constitutional: She is oriented to person, place, and time. She appears well-developed and well-nourished.  HENT:  Head: Normocephalic and atraumatic.  Cardiovascular: Normal rate, regular rhythm and normal heart sounds.   Pulmonary/Chest: Effort normal and breath sounds normal.  Neurological: She is alert and oriented to person, place, and time.  Skin: Skin is warm and dry.  Psychiatric: She has a normal mood and affect. Her behavior is normal.          Assessment & Plan:  DM -  well controlled. Due for foot exam and urine microalbumin today.  Unable to feel her heels on the monofilament exam.  Will add metformin with sugar and weight control.   HTN - well controlled. F/U in 3 months.    BMI 41 - she has gained weight again. Discussed options. We can refer to bariatric clinic vs nutritionist.  Also discussed starting metformin.  She is willing to try this. We'll follow-up in 3 months. Will start 5 mg twice  a day. Discussed potential side effects including diarrhea and GI upset.  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 She is already: On Cymbalta and has a prescription for tramadol to use as needed. Did give her handout on insomnia today.

## 2014-11-12 NOTE — Patient Instructions (Signed)
Insomnia Insomnia is frequent trouble falling and/or staying asleep. Insomnia can be a long term problem or a short term problem. Both are common. Insomnia can be a short term problem when the wakefulness is related to a certain stress or worry. Long term insomnia is often related to ongoing stress during waking hours and/or poor sleeping habits. Overtime, sleep deprivation itself can make the problem worse. Every little thing feels more severe because you are overtired and your ability to cope is decreased. CAUSES   Stress, anxiety, and depression.  Poor sleeping habits.  Distractions such as TV in the bedroom.  Naps close to bedtime.  Engaging in emotionally charged conversations before bed.  Technical reading before sleep.  Alcohol and other sedatives. They may make the problem worse. They can hurt normal sleep patterns and normal dream activity.  Stimulants such as caffeine for several hours prior to bedtime.  Pain syndromes and shortness of breath can cause insomnia.  Exercise late at night.  Changing time zones may cause sleeping problems (jet lag). It is sometimes helpful to have someone observe your sleeping patterns. They should look for periods of not breathing during the night (sleep apnea). They should also look to see how long those periods last. If you live alone or observers are uncertain, you can also be observed at a sleep clinic where your sleep patterns will be professionally monitored. Sleep apnea requires a checkup and treatment. Give your caregivers your medical history. Give your caregivers observations your family has made about your sleep.  SYMPTOMS   Not feeling rested in the morning.  Anxiety and restlessness at bedtime.  Difficulty falling and staying asleep. TREATMENT   Your caregiver may prescribe treatment for an underlying medical disorders. Your caregiver can give advice or help if you are using alcohol or other drugs for self-medication. Treatment  of underlying problems will usually eliminate insomnia problems.  Medications can be prescribed for short time use. They are generally not recommended for lengthy use.  Over-the-counter sleep medicines are not recommended for lengthy use. They can be habit forming.  You can promote easier sleeping by making lifestyle changes such as:  Using relaxation techniques that help with breathing and reduce muscle tension.  Exercising earlier in the day.  Changing your diet and the time of your last meal. No night time snacks.  Establish a regular time to go to bed.  Counseling can help with stressful problems and worry.  Soothing music and white noise may be helpful if there are background noises you cannot remove.  Stop tedious detailed work at least one hour before bedtime. HOME CARE INSTRUCTIONS   Keep a diary. Inform your caregiver about your progress. This includes any medication side effects. See your caregiver regularly. Take note of:  Times when you are asleep.  Times when you are awake during the night.  The quality of your sleep.  How you feel the next day. This information will help your caregiver care for you.  Get out of bed if you are still awake after 15 minutes. Read or do some quiet activity. Keep the lights down. Wait until you feel sleepy and go back to bed.  Keep regular sleeping and waking hours. Avoid naps.  Exercise regularly.  Avoid distractions at bedtime. Distractions include watching television or engaging in any intense or detailed activity like attempting to balance the household checkbook.  Develop a bedtime ritual. Keep a familiar routine of bathing, brushing your teeth, climbing into bed at the same   time each night, listening to soothing music. Routines increase the success of falling to sleep faster.  Use relaxation techniques. This can be using breathing and muscle tension release routines. It can also include visualizing peaceful scenes. You can  also help control troubling or intruding thoughts by keeping your mind occupied with boring or repetitive thoughts like the old concept of counting sheep. You can make it more creative like imagining planting one beautiful flower after another in your backyard garden.  During your day, work to eliminate stress. When this is not possible use some of the previous suggestions to help reduce the anxiety that accompanies stressful situations. MAKE SURE YOU:   Understand these instructions.  Will watch your condition.  Will get help right away if you are not doing well or get worse. Document Released: 04/10/2000 Document Revised: 07/06/2011 Document Reviewed: 05/11/2007 [employer] Patient Information 2015 [employer]. This information is not intended to replace advice given to you by your health care provider. Make sure you discuss any questions you have with your health care provider.  

## 2014-11-12 NOTE — Telephone Encounter (Signed)
Please call patient: In regards to her sleep issue has she are to try Tylenol PM? Or does she have her take her tramadol at bedtime for pain relief.

## 2014-11-14 NOTE — Telephone Encounter (Signed)
lvm for pt to rtn call.Joann Maddox  

## 2014-11-20 ENCOUNTER — Other Ambulatory Visit: Payer: Self-pay | Admitting: Family Medicine

## 2014-11-20 NOTE — Telephone Encounter (Signed)
Pt called and stated she is taking aleve pm and this has helped with her sleep.Joann Maddox

## 2014-12-27 ENCOUNTER — Other Ambulatory Visit: Payer: Self-pay | Admitting: Family Medicine

## 2015-01-01 ENCOUNTER — Other Ambulatory Visit: Payer: Self-pay | Admitting: Family Medicine

## 2015-01-31 ENCOUNTER — Other Ambulatory Visit: Payer: Self-pay | Admitting: Family Medicine

## 2015-02-12 ENCOUNTER — Encounter: Payer: Self-pay | Admitting: Family Medicine

## 2015-02-12 ENCOUNTER — Ambulatory Visit (INDEPENDENT_AMBULATORY_CARE_PROVIDER_SITE_OTHER): Payer: Medicare Other | Admitting: Family Medicine

## 2015-02-12 ENCOUNTER — Other Ambulatory Visit: Payer: Self-pay | Admitting: Family Medicine

## 2015-02-12 VITALS — BP 123/63 | HR 75 | Ht 60.0 in | Wt 222.0 lb

## 2015-02-12 DIAGNOSIS — R600 Localized edema: Secondary | ICD-10-CM | POA: Diagnosis not present

## 2015-02-12 DIAGNOSIS — R0602 Shortness of breath: Secondary | ICD-10-CM

## 2015-02-12 DIAGNOSIS — H6122 Impacted cerumen, left ear: Secondary | ICD-10-CM

## 2015-02-12 DIAGNOSIS — E119 Type 2 diabetes mellitus without complications: Secondary | ICD-10-CM

## 2015-02-12 DIAGNOSIS — E1129 Type 2 diabetes mellitus with other diabetic kidney complication: Secondary | ICD-10-CM

## 2015-02-12 DIAGNOSIS — R5383 Other fatigue: Secondary | ICD-10-CM

## 2015-02-12 DIAGNOSIS — R809 Proteinuria, unspecified: Secondary | ICD-10-CM | POA: Insufficient documentation

## 2015-02-12 LAB — POCT UA - MICROALBUMIN
Creatinine, POC: 50 mg/dL
Microalbumin Ur, POC: 80 mg/L

## 2015-02-12 MED ORDER — LISINOPRIL-HYDROCHLOROTHIAZIDE 20-25 MG PO TABS: 1.0000 | ORAL_TABLET | Freq: Every day | ORAL | Status: DC

## 2015-02-12 NOTE — Patient Instructions (Signed)
Stop the nadalol for now.

## 2015-02-12 NOTE — Progress Notes (Signed)
   SuDeclined flu vaccine today. Working withbjective:    Patient ID: Joann Maddox, female    DOB: 1940-06-05, 74 y.o.   MRN: [id number]  HPI Diabetes - no hypoglycemic events. No wounds or sores that are not healing well. No increased thirst or urination. Checking glucose at home. Taking medications as prescribed without any side effects.  Has had ankle swelling more recently. She has been wearing some support stockings. She is on HCTZ 25mg  daily. Hs been more SOB with acitivities and has been sleeping in a recliner for the last 2 months bc of low back pain.   She is really fatigued all day long.   Says feels her arthritis is getting worse. Does snore but screened neg for sleep apnea.    Left ear is clogged. Would like it irrigated today.     Review of Systems     Objective:   Physical Exam  Constitutional: She is oriented to person, place, and time. She appears well-developed and well-nourished.  HENT:  Head: Normocephalic and atraumatic.  Left ear blocked by cerumen.   Cardiovascular: Normal rate, regular rhythm and normal heart sounds.   Pulmonary/Chest: Effort normal and breath sounds normal.  Musculoskeletal: She exhibits edema.  1+ pitting edema around ankle and trace edema up to knees bilaterally.    Neurological: She is alert and oriented to person, place, and time.  Skin: Skin is warm and dry.  Psychiatric: She has a normal mood and affect. Her behavior is normal.          Assessment & Plan:  DM- Well controlled. F/U in 6 months.  Continue current regimen.   Microalbuminuria.  Discussed staring ACEi for renal protection. F/u in 1 months. Hold the nadolol.    Fatigue - check CBC and TSH. She is hypothyroid.   Left ear cerumen impaction - irrigation performed today. Tolerated well.   LE edema with SOB - will schedule for echo. Check thyroid and kidney function.  Will see if can check BNP as well.

## 2015-02-13 LAB — COMPLETE METABOLIC PANEL WITH GFR
ALT: 21 U/L (ref 6–29)
AST: 24 U/L (ref 10–35)
Albumin: 3.8 g/dL (ref 3.6–5.1)
Alkaline Phosphatase: 90 U/L (ref 33–130)
BUN: 18 mg/dL (ref 7–25)
CO2: 28 mmol/L (ref 20–31)
Calcium: 9.3 mg/dL (ref 8.6–10.4)
Chloride: 102 mmol/L (ref 98–110)
Creat: 0.6 mg/dL (ref 0.60–0.93)
GFR, Est African American: >89 mL/min (ref >=60)
GFR, Est Non African American: >89 mL/min (ref >=60)
Glucose, Bld: 107 mg/dL — ABNORMAL HIGH (ref 65–99)
Potassium: 4.2 mmol/L (ref 3.5–5.3)
Sodium: 141 mmol/L (ref 135–146)
Total Bilirubin: 0.6 mg/dL (ref 0.2–1.2)
Total Protein: 6.2 g/dL (ref 6.1–8.1)

## 2015-02-13 LAB — URINALYSIS, ROUTINE W REFLEX MICROSCOPIC
Bilirubin Urine: NEGATIVE
Glucose, UA: NEGATIVE
Hgb urine dipstick: NEGATIVE
Leukocytes, UA: NEGATIVE
Nitrite: NEGATIVE
Protein, ur: NEGATIVE
Specific Gravity, Urine: 1.024 (ref 1.001–1.035)
pH: 6.5 (ref 5.0–8.0)

## 2015-02-13 LAB — CBC WITH DIFFERENTIAL/PLATELET
Basophils Absolute: 0.1 10*3/uL (ref 0.0–0.1)
Basophils Relative: 1 % (ref 0–1)
Eosinophils Absolute: 0.2 10*3/uL (ref 0.0–0.7)
Eosinophils Relative: 4 % (ref 0–5)
HCT: 36.5 % (ref 36.0–46.0)
Hemoglobin: 12.5 g/dL (ref 12.0–15.0)
Lymphocytes Relative: 35 % (ref 12–46)
Lymphs Abs: 1.9 10*3/uL (ref 0.7–4.0)
MCH: 31.2 pg (ref 26.0–34.0)
MCHC: 34.2 g/dL (ref 30.0–36.0)
MCV: 91 fL (ref 78.0–100.0)
MPV: 9.3 fL (ref 8.6–12.4)
Monocytes Absolute: 0.6 10*3/uL (ref 0.1–1.0)
Monocytes Relative: 10 % (ref 3–12)
Neutro Abs: 2.8 10*3/uL (ref 1.7–7.7)
Neutrophils Relative %: 50 % (ref 43–77)
Platelets: 236 10*3/uL (ref 150–400)
RBC: 4.01 MIL/uL (ref 3.87–5.11)
RDW: 14.7 % (ref 11.5–15.5)
WBC: 5.5 10*3/uL (ref 4.0–10.5)

## 2015-02-13 LAB — TSH: TSH: 0.738 u[IU]/mL (ref 0.350–4.500)

## 2015-02-14 LAB — BRAIN NATRIURETIC PEPTIDE: Brain Natriuretic Peptide: 36.2 pg/mL (ref 0.0–100.0)

## 2015-03-01 ENCOUNTER — Other Ambulatory Visit: Payer: Self-pay | Admitting: Family Medicine

## 2015-03-11 ENCOUNTER — Other Ambulatory Visit: Payer: Self-pay | Admitting: Family Medicine

## 2015-03-12 ENCOUNTER — Encounter: Payer: Self-pay | Admitting: Family Medicine

## 2015-03-12 ENCOUNTER — Ambulatory Visit: Payer: Medicare Other | Admitting: Family Medicine

## 2015-03-12 ENCOUNTER — Ambulatory Visit (INDEPENDENT_AMBULATORY_CARE_PROVIDER_SITE_OTHER): Payer: Medicare Other | Admitting: Family Medicine

## 2015-03-12 VITALS — BP 133/57 | HR 85 | Wt 200.5 lb

## 2015-03-12 DIAGNOSIS — K5909 Other constipation: Secondary | ICD-10-CM

## 2015-03-12 DIAGNOSIS — E119 Type 2 diabetes mellitus without complications: Secondary | ICD-10-CM | POA: Diagnosis not present

## 2015-03-12 DIAGNOSIS — I10 Essential (primary) hypertension: Secondary | ICD-10-CM

## 2015-03-12 DIAGNOSIS — K5900 Constipation, unspecified: Secondary | ICD-10-CM | POA: Diagnosis not present

## 2015-03-12 LAB — POCT GLYCOSYLATED HEMOGLOBIN (HGB A1C): Hemoglobin A1C: 5.8

## 2015-03-12 NOTE — Patient Instructions (Signed)
Can try the Miralax - Take it twice a day until you have soft bowel. Then back off to once a day at bedtime.

## 2015-03-12 NOTE — Progress Notes (Signed)
Subjective:    Patient ID: Joann Maddox, female    DOB: June 15, 1940, 74 y.o.   MRN: [id number]  HPI Diabetes - no hypoglycemic events. No wounds or sores that are not healing well. No increased thirst or urination. Checking glucose at home. Taking medications as prescribed without any side effects.  Hypertension- Pt denies chest pain, SOB, dizziness, or heart palpitations.  Taking meds as directed w/o problems.  Denies medication side effects.    Constipation - she has been taking Senokot for years to help her go. She feels that this is not working anymore. She wonders if it could be any of her medications.   Review of Systems  BP 133/57 mmHg  Pulse 85  Wt 200 lb 8 oz (90.946 kg)    Allergies  Allergen Reactions  . Iodine Other (See Comments)    Shook violently and passed out.  . Codeine Other (See Comments)    Hallucinations  . Lipitor [Atorvastatin] Other (See Comments)    Myalgias    Past Medical History  Diagnosis Date  . Headache(784.0)     migrains  . Heart murmur   . Anxiety   . Hypothyroidism   . Rheumatoid arthritis(714.0)     Past Surgical History  Procedure Laterality Date  . Abdominal hysterectomy    . Appendectomy    . Cholecystectomy    . Tonsillectomy      as  a child  . Replacement total knee bilateral    . Total hip arthroplasty      right and left    Social History   Social History  . Marital Status: Married    Spouse Name: Dorene Sorrow  . Number of Children: 2  . Years of Education: N/A   Occupational History  . Retired Emergency planning/management officer   Social History Main Topics  . Smoking status: Never Smoker   . Smokeless tobacco: Not on file  . Alcohol Use: No  . Drug Use: Not on file  . Sexual Activity: Not on file   Other Topics Concern  . Not on file   Social History Narrative   No regular exercise. 2 caffeine drinks per day.     Family History  Problem Relation Age of Onset  . Heart disease Father 71  . Diabetes     aunt   . Stroke Mother 71    Outpatient Encounter Prescriptions as of 03/12/2015  Medication Sig  . butalbital-acetaminophen-caffeine (FIORICET, ESGIC) 50-325-40 MG per tablet Take 1 tablet by mouth 2 (two) times daily as needed.  . DULoxetine (CYMBALTA) 60 MG capsule Take 1 capsule (60 mg total) by mouth daily. Please put on hold until patient calls.  . folic acid (FOLVITE) 1 MG tablet TAKE 1 TABLET BY MOUTH EVERY DAY  . hydroxychloroquine (PLAQUENIL) 200 MG tablet Take 200 mg by mouth daily.  Marland Kitchen KLOR-CON M10 10 MEQ tablet TAKE 1 TABLET (10 MEQ TOTAL) BY MOUTH 2 (TWO) TIMES DAILY.  Marland Kitchen levothyroxine (SYNTHROID, LEVOTHROID) 25 MCG tablet TAKE 1 TABLET (25 MCG TOTAL) BY MOUTH DAILY.  Marland Kitchen lisinopril-hydrochlorothiazide (PRINZIDE,ZESTORETIC) 20-25 MG tablet Take 1 tablet by mouth daily.  . metFORMIN (GLUCOPHAGE) 500 MG tablet TAKE 1 TABLET (500 MG TOTAL) BY MOUTH 2 (TWO) TIMES DAILY WITH A MEAL.  . methotrexate 50 MG/2ML injection 1 ML WEEKLY INJECTION 30 DAYS  . metroNIDAZOLE (METROGEL) 0.75 % gel Apply 1 application topically 2 (two) times daily. To face  . Multiple Vitamin (MULTIVITAMIN) tablet  Take 1 tablet by mouth daily.  Marland Kitchen MYRBETRIQ 25 MG TB24 tablet TAKE 1 TABLET BY MOUTH DAILY.  Marland Kitchen senna (SENOKOT) 8.6 MG tablet Take 1 tablet by mouth as needed.   . traMADol (ULTRAM) 50 MG tablet TAKE 1 TO 2 TABLETS BY MOUTH EVERY 12 HOURS AS NEEDED  . vitamin B-12 (CYANOCOBALAMIN) 100 MCG tablet Take 50 mcg by mouth daily.  . Vitamin D, Ergocalciferol, (DRISDOL) 50000 UNITS CAPS capsule TAKE 1 CAPSULE (50,000 UNITS TOTAL) BY MOUTH ONCE A WEEK.  . [DISCONTINUED] nadolol (CORGARD) 20 MG tablet TAKE 1 TABLET (20 MG TOTAL) BY MOUTH DAILY. NEED APPOINTMENT FOR MORE REFILLS  . [DISCONTINUED] nadolol (CORGARD) 20 MG tablet TAKE 1 TABLET (20 MG TOTAL) BY MOUTH DAILY.   No facility-administered encounter medications on file as of 03/12/2015.          Objective:   Physical Exam  Constitutional: She is  oriented to person, place, and time. She appears well-developed and well-nourished.  HENT:  Head: Normocephalic and atraumatic.  Cardiovascular: Normal rate, regular rhythm and normal heart sounds.   Pulmonary/Chest: Effort normal and breath sounds normal.  Neurological: She is alert and oriented to person, place, and time.  Skin: Skin is warm and dry.  Psychiatric: She has a normal mood and affect. Her behavior is normal.          Assessment & Plan:  DM- well controlled.  A1C is 5.8. Continue current regimen. Will call Dr. Karena Addison for eye report.    HTN - well controlled on the ACE and off the nadalol.    Chronic constipation - certainly the tramadol can be causing more constipation. The metformin should actually be helping. We discussed her taking plenty of water and increasing fiber. She may even want to do his fiber supplement such as Benefiber with each meal. We also discussed using MiraLAX. Recommend that she is a twice a day until she has a soft bowel movement and after that can try decreasing to once a day for maintenance therapy.  Declined flu vaccind. Declined tetanus vaccine.

## 2015-03-13 ENCOUNTER — Other Ambulatory Visit: Payer: Self-pay | Admitting: Family Medicine

## 2015-04-07 ENCOUNTER — Other Ambulatory Visit: Payer: Self-pay | Admitting: Family Medicine

## 2015-04-10 ENCOUNTER — Other Ambulatory Visit: Payer: Self-pay

## 2015-04-10 MED ORDER — DULOXETINE HCL 60 MG PO CPEP: 60.0000 mg | ORAL_CAPSULE | Freq: Every day | ORAL | Status: DC

## 2015-05-01 LAB — BASIC METABOLIC PANEL
BUN: 20 mg/dL (ref 4–21)
Creatinine: 0.7 mg/dL (ref 0.5–1.1)
Glucose: 78 mg/dL
Potassium: 4.6 mmol/L (ref 3.4–5.3)
Sodium: 143 mmol/L (ref 137–147)

## 2015-05-01 LAB — HEPATIC FUNCTION PANEL
ALT: 23 U/L (ref 7–35)
AST: 23 U/L (ref 13–35)
Alkaline Phosphatase: 83 U/L (ref 25–125)
Bilirubin, Total: 0.4 mg/dL

## 2015-05-01 LAB — CBC AND DIFFERENTIAL
HCT: 37 % (ref 36–46)
Hemoglobin: 12.4 g/dL (ref 12.0–16.0)
Platelets: 286 10*3/uL (ref 150–399)
WBC: 5 10^3/mL

## 2015-05-14 ENCOUNTER — Other Ambulatory Visit: Payer: Self-pay | Admitting: Family Medicine

## 2015-05-19 ENCOUNTER — Other Ambulatory Visit: Payer: Self-pay | Admitting: Family Medicine

## 2015-05-22 ENCOUNTER — Encounter: Payer: Self-pay | Admitting: Family Medicine

## 2015-06-12 ENCOUNTER — Ambulatory Visit (INDEPENDENT_AMBULATORY_CARE_PROVIDER_SITE_OTHER): Payer: Medicare Other | Admitting: Family Medicine

## 2015-06-12 ENCOUNTER — Encounter: Payer: Self-pay | Admitting: Family Medicine

## 2015-06-12 VITALS — BP 132/50 | HR 81 | Wt 221.0 lb

## 2015-06-12 DIAGNOSIS — Z1231 Encounter for screening mammogram for malignant neoplasm of breast: Secondary | ICD-10-CM | POA: Diagnosis not present

## 2015-06-12 DIAGNOSIS — E119 Type 2 diabetes mellitus without complications: Secondary | ICD-10-CM | POA: Diagnosis not present

## 2015-06-12 DIAGNOSIS — Z780 Asymptomatic menopausal state: Secondary | ICD-10-CM | POA: Diagnosis not present

## 2015-06-12 DIAGNOSIS — E038 Other specified hypothyroidism: Secondary | ICD-10-CM | POA: Diagnosis not present

## 2015-06-12 LAB — POCT GLYCOSYLATED HEMOGLOBIN (HGB A1C): Hemoglobin A1C: 5.8

## 2015-06-12 MED ORDER — NYSTATIN 100000 UNIT/GM EX POWD: 1.0000 g | Freq: Two times a day (BID) | CUTANEOUS | Status: DC

## 2015-06-12 MED ORDER — LEVOTHYROXINE SODIUM 25 MCG PO TABS: 25.0000 ug | ORAL_TABLET | Freq: Every day | ORAL | Status: DC

## 2015-06-12 MED ORDER — AZELAIC ACID 20 % EX CREA: TOPICAL_CREAM | Freq: Two times a day (BID) | CUTANEOUS | Status: DC

## 2015-06-12 NOTE — Progress Notes (Signed)
   Subjective:    Patient ID: Joann Maddox, female    DOB: [birth year], 75 y.o.   MRN: [id number]  HPI Diabetes - no hypoglycemic events. No wounds or sores that are not healing well. No increased thirst or urination. Checking glucose at home. Taking medications as prescribed without any side effects.   Hypertension- Pt denies chest pain, SOB, dizziness, or heart palpitations.  Taking meds as directed w/o problems.  Denies medication side effects.    Rosacea-she's been applying the metronidazole daily but says she still getting acne type bumps on her for head and nose.  She also complains of a rash on her lower abdomen for about the last 2-1/2 weeks. She says she tries really hard to keep that skin dry and to avoid excess moisture. She had a similar rash a couple years ago. She's been using over-the-counter hydrocortisone 10 on it. It does provide relief for the itch but doesn't seem to be resolving the rash itself.   Review of Systems     Objective:   Physical Exam  Constitutional: She is oriented to person, place, and time. She appears well-developed and well-nourished.  HENT:  Head: Normocephalic and atraumatic.  Cardiovascular: Normal rate, regular rhythm and normal heart sounds.   Pulmonary/Chest: Effort normal and breath sounds normal.  Neurological: She is alert and oriented to person, place, and time.  Skin: Skin is warm and dry.  erythrematous well demarcated rash underpannus.  No central clearing. I did not see any papules or pustules as she had a fair amount of makeup on today I did feet see a few small erythematous areas on the for head that look like they were healing.  Psychiatric: She has a normal mood and affect. Her behavior is normal.          Assessment & Plan:  DM- well controlled.  A1C of 5.8. Will clal for report for [facility] exam.    Hypertension-well-controlled. Continue current regimen.  Candidiasis is lower abdomen-rash is most consistent with  this. Will treat with nystatin powder. If not significantly better in 2 weeks and please give me a call back. If it is helping that encouraged her to complete a three-week course of the topical treatment. Next  Rosacea-to see any of the lesions that she is worried about today as she does have a fair amount of makeup on. Discontinue metronidazole. Switch to azelaic acid.    Given info for Prevnar 13.

## 2015-06-13 ENCOUNTER — Other Ambulatory Visit: Payer: Self-pay | Admitting: Family Medicine

## 2015-07-06 ENCOUNTER — Other Ambulatory Visit: Payer: Self-pay | Admitting: Family Medicine

## 2015-07-09 ENCOUNTER — Encounter: Payer: Self-pay | Admitting: Family Medicine

## 2015-07-09 ENCOUNTER — Ambulatory Visit (INDEPENDENT_AMBULATORY_CARE_PROVIDER_SITE_OTHER): Payer: Medicare Other | Admitting: Family Medicine

## 2015-07-09 VITALS — BP 132/57 | HR 96 | Temp 97.5°F | Wt 219.0 lb

## 2015-07-09 DIAGNOSIS — K5900 Constipation, unspecified: Secondary | ICD-10-CM

## 2015-07-09 DIAGNOSIS — J069 Acute upper respiratory infection, unspecified: Secondary | ICD-10-CM

## 2015-07-09 DIAGNOSIS — K5909 Other constipation: Secondary | ICD-10-CM

## 2015-07-09 DIAGNOSIS — H6122 Impacted cerumen, left ear: Secondary | ICD-10-CM

## 2015-07-09 DIAGNOSIS — H918X2 Other specified hearing loss, left ear: Secondary | ICD-10-CM

## 2015-07-09 MED ORDER — FLUTICASONE PROPIONATE 50 MCG/ACT NA SUSP: 2.0000 | Freq: Every day | NASAL | Status: DC

## 2015-07-09 MED ORDER — VITAMIN D (ERGOCALCIFEROL) 1.25 MG (50000 UNIT) PO CAPS: ORAL_CAPSULE | ORAL | Status: DC

## 2015-07-09 MED ORDER — METFORMIN HCL 500 MG PO TABS: ORAL_TABLET | ORAL | Status: DC

## 2015-07-09 NOTE — Patient Instructions (Addendum)
Recommend a trial of Miralax for your bowels. Can take twice a day until stools are soft without having to strain. Then can decrease to once a day. Upper Respiratory Infection, Adult Most upper respiratory infections (URIs) are a viral infection of the air passages leading to the lungs. A URI affects the nose, throat, and upper air passages. The most common type of URI is nasopharyngitis and is typically referred to as "the common cold." URIs run their course and usually go away on their own. Most of the time, a URI does not require medical attention, but sometimes a bacterial infection in the upper airways can follow a viral infection. This is called a secondary infection. Sinus and middle ear infections are common types of secondary upper respiratory infections. Bacterial pneumonia can also complicate a URI. A URI can worsen asthma and chronic obstructive pulmonary disease (COPD). Sometimes, these complications can require emergency medical care and may be life threatening.  CAUSES Almost all URIs are caused by viruses. A virus is a type of germ and can spread from one person to another.  RISKS FACTORS You may be at risk for a URI if:   You smoke.   You have chronic heart or lung disease.  You have a weakened defense (immune) system.   You are very young or very old.   You have nasal allergies or asthma.  You work in crowded or poorly ventilated areas.  You work in health care facilities or schools. SIGNS AND SYMPTOMS  Symptoms typically develop 2-3 days after you come in contact with a cold virus. Most viral URIs last 7-10 days. However, viral URIs from the influenza virus (flu virus) can last 14-18 days and are typically more severe. Symptoms may include:   Runny or stuffy (congested) nose.   Sneezing.   Cough.   Sore throat.   Headache.   Fatigue.   Fever.   Loss of appetite.   Pain in your forehead, behind your eyes, and over your cheekbones (sinus  pain).  Muscle aches.  DIAGNOSIS  Your health care provider may diagnose a URI by:  Physical exam.  Tests to check that your symptoms are not due to another condition such as:  Strep throat.  Sinusitis.  Pneumonia.  Asthma. TREATMENT  A URI goes away on its own with time. It cannot be cured with medicines, but medicines may be prescribed or recommended to relieve symptoms. Medicines may help:  Reduce your fever.  Reduce your cough.  Relieve nasal congestion. HOME CARE INSTRUCTIONS   Take medicines only as directed by your health care provider.   Gargle warm saltwater or take cough drops to comfort your throat as directed by your health care provider.  Use a warm mist humidifier or inhale steam from a shower to increase air moisture. This may make it easier to breathe.  Drink enough fluid to keep your urine clear or pale yellow.   Eat soups and other clear broths and maintain good nutrition.   Rest as needed.   Return to work when your temperature has returned to normal or as your health care provider advises. You may need to stay home longer to avoid infecting others. You can also use a face mask and careful hand washing to prevent spread of the virus.  Increase the usage of your inhaler if you have asthma.   Do not use any tobacco products, including cigarettes, chewing tobacco, or electronic cigarettes. If you need help quitting, ask your health care provider.  PREVENTION  The best way to protect yourself from getting a cold is to practice good hygiene.   Avoid oral or hand contact with people with cold symptoms.   Wash your hands often if contact occurs.  There is no clear evidence that vitamin C, vitamin E, echinacea, or exercise reduces the chance of developing a cold. However, it is always recommended to get plenty of rest, exercise, and practice good nutrition.  SEEK MEDICAL CARE IF:   You are getting worse rather than better.   Your symptoms are  not controlled by medicine.   You have chills.  You have worsening shortness of breath.  You have brown or red mucus.  You have yellow or brown nasal discharge.  You have pain in your face, especially when you bend forward.  You have a fever.  You have swollen neck glands.  You have pain while swallowing.  You have white areas in the back of your throat. SEEK IMMEDIATE MEDICAL CARE IF:   You have severe or persistent:  Headache.  Ear pain.  Sinus pain.  Chest pain.  You have chronic lung disease and any of the following:  Wheezing.  Prolonged cough.  Coughing up blood.  A change in your usual mucus.  You have a stiff neck.  You have changes in your:  Vision.  Hearing.  Thinking.  Mood. MAKE SURE YOU:   Understand these instructions.  Will watch your condition.  Will get help right away if you are not doing well or get worse.   This information is not intended to replace advice given to you by your health care provider. Make sure you discuss any questions you have with your health care provider.   Document Released: 10/07/2000 Document Revised: 08/28/2014 Document Reviewed: 07/19/2013 Elsevier Interactive Patient Education [employer]

## 2015-07-09 NOTE — Progress Notes (Signed)
   Subjective:    Patient ID: Joann Maddox, female    DOB: [birth year], 75 y.o.   MRN: [id number]  HPI 6 days of left ear pain..has been seeing chiropractor and after an adjustment to her neck she noticed her ear "plugged up".  Now feels like her right ear is starting to hurt the day or two. No fever, or chills. She feels like she has had cold sxs for the last 2 day.  She's had some nasal congestion with postnasal drip and mild sore throat.  Also C/O constipation. She usually takes a Senokot every night and that usually helps some but lately not working well . No blood in the stoo.    Review of Systems     Objective:   Physical Exam  Constitutional: She is oriented to person, place, and time. She appears well-developed and well-nourished.  HENT:  Head: Normocephalic and atraumatic.  Right Ear: External ear normal.  Left Ear: External ear normal.  Nose: Nose normal.  Mouth/Throat: Oropharynx is clear and moist.  Left TM and canal 100%blocke by cerumen.  Right Tm and canal is clear.   Eyes: Conjunctivae and EOM are normal. Pupils are equal, round, and reactive to light.  Neck: Neck supple. No thyromegaly present.  Cardiovascular: Normal rate, regular rhythm and normal heart sounds.   Pulmonary/Chest: Effort normal and breath sounds normal. She has no wheezes.  Lymphadenopathy:    She has no cervical adenopathy.  Neurological: She is alert and oriented to person, place, and time.  Skin: Skin is warm and dry.  Psychiatric: She has a normal mood and affect.    Left TM upon inspection after cerumen removal showed a dull tympanic membrane but no erythema. Loss of light reflex.      Assessment & Plan:  Upper respiratory infection-likely viral. Recommend symptomatically care. Please call back if develops fever suddenly gets worse or if not better before the weekend.  Left cerumen impaction  -irrigation performed. Patient tolerated very well. And she could actually hear better out of  the ear after cerumen impaction. TM the neck membrane appears to be goal. I suspect she probably has some pressure behind the TM and with her recent upper respiratory infection this is not surprising. Recommend a trial of a nasal steroid spray.  Constipation, chronic - Commend a trial of MiraLAX. Her to take twice a day until she has a soft bowel movement without straining and then can decrease down to once a day. All back if not moving her bowels in the next few days. Call if starts to get abdominal pain fevers or blood in the stool.

## 2015-07-10 ENCOUNTER — Ambulatory Visit (INDEPENDENT_AMBULATORY_CARE_PROVIDER_SITE_OTHER): Payer: Medicare Other

## 2015-07-10 DIAGNOSIS — Z1231 Encounter for screening mammogram for malignant neoplasm of breast: Secondary | ICD-10-CM | POA: Diagnosis not present

## 2015-07-10 DIAGNOSIS — Z780 Asymptomatic menopausal state: Secondary | ICD-10-CM

## 2015-07-10 DIAGNOSIS — Z13820 Encounter for screening for osteoporosis: Secondary | ICD-10-CM

## 2015-07-21 ENCOUNTER — Other Ambulatory Visit: Payer: Self-pay | Admitting: Family Medicine

## 2015-08-22 ENCOUNTER — Telehealth: Payer: Self-pay

## 2015-08-22 NOTE — Telephone Encounter (Signed)
Printed new rx to fax.

## 2015-08-23 ENCOUNTER — Other Ambulatory Visit: Payer: Self-pay | Admitting: Internal Medicine

## 2015-08-23 DIAGNOSIS — M542 Cervicalgia: Secondary | ICD-10-CM

## 2015-08-23 MED ORDER — BUTALBITAL-APAP-CAFFEINE 50-325-40 MG PO TABS: 1.0000 | ORAL_TABLET | Freq: Two times a day (BID) | ORAL | Status: DC | PRN

## 2015-08-23 NOTE — Telephone Encounter (Signed)
Notified patient of medication sent to pharmacy.

## 2015-08-26 ENCOUNTER — Ambulatory Visit (INDEPENDENT_AMBULATORY_CARE_PROVIDER_SITE_OTHER): Payer: Medicare Other | Admitting: Family Medicine

## 2015-08-26 ENCOUNTER — Encounter: Payer: Self-pay | Admitting: Family Medicine

## 2015-08-26 VITALS — BP 122/53 | HR 81 | Wt 215.0 lb

## 2015-08-26 DIAGNOSIS — L03011 Cellulitis of right finger: Secondary | ICD-10-CM | POA: Diagnosis not present

## 2015-08-26 NOTE — Progress Notes (Signed)
   Subjective:    Patient ID: Joann Maddox, female    DOB: January 13, 1941, 75 y.o.   MRN: [id number]  HPI Patient comes in today for right pain and swelling. She says it actually started at the end of last week. It started to look a little bit red and felt a little bit tender. Then over the weekend while she was traveling he became extremely swollen and very red and angry and became much more painful. In fact she went to a local urgent care there. They started her on 14 days of Diflucan as well as soaking it and then applying an over-the-counter cream which she couldn't remember the name of. She started her treatment on Saturday. She is following up now 2 days later and says that it's significantly better. Is still tender but the swelling is down by at least 50% and the redness is significantly improved. She said she's not sure if it actually drained or not. They did not lance it at the office there. No fevers chills or sweats.   Review of Systems     Objective:   Physical Exam  Constitutional: She is oriented to person, place, and time. She appears well-developed and well-nourished.  HENT:  Head: Normocephalic and atraumatic.  Eyes: Conjunctivae and EOM are normal.  Cardiovascular: Normal rate.   Pulmonary/Chest: Effort normal.  Neurological: She is alert and oriented to person, place, and time.  Skin: Skin is dry. No pallor.  Left great thumb with some crusting underneath the nail at the distal nail bed. She has some overall swelling of the thumb but it looks more dusky in color versus bright red erythematous. I did tell little bit worse swollen compared to her right thumb. No distinct abscess or active drainage. No streaking redness. Thumb joint with normal range of motion.  Psychiatric: She has a normal mood and affect. Her behavior is normal.  Vitals reviewed.         Assessment & Plan:  Paronychia-seems to be healing fantastically on oral Diflucan. She's only been on it for 2 days  and her symptoms are at least 50% improved. Continue current regimen and continue with soaks and over-the-counter cream. Call if not continuing to get better over the week.

## 2015-08-31 ENCOUNTER — Ambulatory Visit
Admission: RE | Admit: 2015-08-31 | Discharge: 2015-08-31 | Disposition: A | Discharge status: Home / Self Care | Payer: Medicare Other | Source: Ambulatory Visit | Attending: Internal Medicine | Admitting: Internal Medicine

## 2015-08-31 DIAGNOSIS — M542 Cervicalgia: Secondary | ICD-10-CM

## 2015-09-16 ENCOUNTER — Ambulatory Visit: Payer: Medicare Other | Admitting: Family Medicine

## 2015-09-18 ENCOUNTER — Encounter: Payer: Self-pay | Admitting: Family Medicine

## 2015-09-18 ENCOUNTER — Ambulatory Visit (INDEPENDENT_AMBULATORY_CARE_PROVIDER_SITE_OTHER): Payer: Medicare Other | Admitting: Family Medicine

## 2015-09-18 VITALS — BP 123/56 | HR 85 | Wt 219.0 lb

## 2015-09-18 DIAGNOSIS — E119 Type 2 diabetes mellitus without complications: Secondary | ICD-10-CM

## 2015-09-18 DIAGNOSIS — B351 Tinea unguium: Secondary | ICD-10-CM | POA: Diagnosis not present

## 2015-09-18 DIAGNOSIS — E785 Hyperlipidemia, unspecified: Secondary | ICD-10-CM

## 2015-09-18 DIAGNOSIS — I10 Essential (primary) hypertension: Secondary | ICD-10-CM

## 2015-09-18 DIAGNOSIS — S61309A Unspecified open wound of unspecified finger with damage to nail, initial encounter: Secondary | ICD-10-CM

## 2015-09-18 MED ORDER — NYSTATIN 100000 UNIT/GM EX POWD: 1.0000 g | Freq: Two times a day (BID) | CUTANEOUS | Status: DC

## 2015-09-18 MED ORDER — HYDROCODONE-ACETAMINOPHEN 5-325 MG PO TABS: 1.0000 | ORAL_TABLET | ORAL | Status: DC | PRN

## 2015-09-18 MED ORDER — TERBINAFINE HCL 250 MG PO TABS: 250.0000 mg | ORAL_TABLET | Freq: Every day | ORAL | Status: DC

## 2015-09-18 NOTE — Progress Notes (Signed)
Subjective:    Patient ID: Joann Maddox, female    DOB: 12-13-1940, 75 y.o.   MRN: [id number]  HPI Diabetes - no hypoglycemic events. No wounds or sores that are not healing well. No increased thirst or urination. Checking glucose at home. Taking medications as prescribed without any side effects.  Hypertension- Pt denies chest pain, SOB, dizziness, or heart palpitations.  Taking meds as directed w/o problems.  Denies medication side effects.    Left great thumb nail is partially attached. She got a nail fungus underneath and now it is only partially attached. She keeps catching it on things and nwo it is sore.  No pus or swelling or fever.   She has been scheduled with the neurosurgeon next week for her neck.    She has a groin rash again. She would like a refill on the nystatin powder. Says was traveling and sitting for long periods and says that really got it flared-up.    Review of Systems  BP 123/56 mmHg  Pulse 85  Wt 219 lb (99.338 kg)  SpO2 99%    Allergies  Allergen Reactions  . Iodine Other (See Comments)    Shook violently and passed out.  . Codeine Other (See Comments)    Hallucinations  . Lipitor [Atorvastatin] Other (See Comments)    Myalgias    Past Medical History  Diagnosis Date  . Headache(784.0)     migrains  . Heart murmur   . Anxiety   . Hypothyroidism   . Rheumatoid arthritis(714.0)     Past Surgical History  Procedure Laterality Date  . Abdominal hysterectomy    . Appendectomy    . Cholecystectomy    . Tonsillectomy      as  a child  . Replacement total knee bilateral    . Total hip arthroplasty      right and left    Social History   Social History  . Marital Status: Married    Spouse Name: Dorene Sorrow  . Number of Children: 2  . Years of Education: N/A   Occupational History  . Retired Emergency planning/management officer   Social History Main Topics  . Smoking status: Never Smoker   . Smokeless tobacco: Not on file  . Alcohol Use: No   . Drug Use: Not on file  . Sexual Activity: Not on file   Other Topics Concern  . Not on file   Social History Narrative   No regular exercise. 2 caffeine drinks per day.     Family History  Problem Relation Age of Onset  . Heart disease Father 20  . Diabetes      aunt   . Stroke Mother 101    Outpatient Encounter Prescriptions as of 09/18/2015  Medication Sig  . azelaic acid (AZELEX) 20 % cream Apply topically 2 (two) times daily. After skin is thoroughly washed and patted dry, gently but thoroughly massage a thin film of azelaic acid cream into the affected area twice daily, in the morning and evening.  . butalbital-acetaminophen-caffeine (FIORICET, ESGIC) 50-325-40 MG tablet Take 1 tablet by mouth 2 (two) times daily as needed.  . DULoxetine (CYMBALTA) 60 MG capsule Take 1 capsule (60 mg total) by mouth daily. Please put on hold until patient calls.  . fluticasone (FLONASE) 50 MCG/ACT nasal spray Place 2 sprays into both nostrils daily.  . folic acid (FOLVITE) 1 MG tablet TAKE 1 TABLET BY MOUTH EVERY DAY  . HYDROcodone-acetaminophen (  NORCO/VICODIN) 5-325 MG tablet Take 1 tablet by mouth every 4 (four) hours as needed for moderate pain.  . hydroxychloroquine (PLAQUENIL) 200 MG tablet Take 200 mg by mouth daily.  Marland Kitchen KLOR-CON M10 10 MEQ tablet TAKE 1 TABLET (10 MEQ TOTAL) BY MOUTH 2 (TWO) TIMES DAILY.  Marland Kitchen levothyroxine (SYNTHROID, LEVOTHROID) 25 MCG tablet Take 1 tablet (25 mcg total) by mouth daily.  Marland Kitchen lisinopril-hydrochlorothiazide (PRINZIDE,ZESTORETIC) 20-25 MG tablet TAKE 1 TABLET BY MOUTH DAILY.  . metFORMIN (GLUCOPHAGE) 500 MG tablet TAKE 1 TABLET (500 MG TOTAL) BY MOUTH 2 (TWO) TIMES DAILY WITH A MEAL.  . methotrexate 50 MG/2ML injection 1 ML WEEKLY INJECTION 30 DAYS  . Multiple Vitamin (MULTIVITAMIN) tablet Take 1 tablet by mouth daily.  Marland Kitchen MYRBETRIQ 25 MG TB24 tablet TAKE 1 TABLET BY MOUTH DAILY.  Marland Kitchen nystatin (NYSTATIN) powder Apply 1 g topically 2 (two) times daily. X 3 weeks.   . senna (SENOKOT) 8.6 MG tablet Take 1 tablet by mouth as needed.   . terbinafine (LAMISIL) 250 MG tablet Take 1 tablet (250 mg total) by mouth daily.  . traMADol (ULTRAM) 50 MG tablet TAKE 1 TO 2 TABLETS BY MOUTH EVERY 12 HOURS AS NEEDED FOR PAIN.  Marland Kitchen vitamin B-12 (CYANOCOBALAMIN) 100 MCG tablet Take 50 mcg by mouth daily.  . [DISCONTINUED] nystatin (MYCOSTATIN/NYSTOP) 100000 UNIT/GM POWD Apply 1 g topically 2 (two) times daily. X 3 weeks.  . [DISCONTINUED] Vitamin D, Ergocalciferol, (DRISDOL) 50000 units CAPS capsule TAKE 1 CAPSULE (50,000 UNITS TOTAL) BY MOUTH ONCE A WEEK.   No facility-administered encounter medications on file as of 09/18/2015.          Objective:   Physical Exam  Constitutional: She is oriented to person, place, and time. She appears well-developed and well-nourished.  HENT:  Head: Normocephalic and atraumatic.  Cardiovascular: Normal rate, regular rhythm and normal heart sounds.   Pulmonary/Chest: Effort normal and breath sounds normal.  Neurological: She is alert and oriented to person, place, and time.  Skin: Skin is warm and dry.  Psychiatric: She has a normal mood and affect. Her behavior is normal.   Right great thumb nail is partially detached and is thickened white. The medial edges still attached. No swelling or erythema or drainage from the area.     Assessment & Plan:  DM- Well-controlled. Continue current regimen. She is on an ACE inhibitor. Lab Results  Component Value Date   HGBA1C 5.8 06/12/2015   HTN- Well controlled. Continue current regimen. Follow up in 4 months   Righ thumb nail partial avulsion With onychomycosis - nail removed and patient tolerated well.  She does have some fresh nail that was growing in underneath. She also has some fungus on the left great nail as well. Start lamisil 250mg  QD x 6 mo.   Tinea cruris - will refill her nystatin powder.    Toenail Avulsion Procedure Note  Pre-operative Diagnosis: Right Ingrown thum,  partial avulsion   Post-operative Diagnosis: same  Indications: pain   Anesthesia: Lidocaine 1% without epinephrine without added sodium bicarbonate  Procedure Details  The risks (including bleeding and infection) and benefits of the  procedure and Verbal informed consent obtained.  After digital block anesthesia was obtained, a tourniquet was applied for hemostasis during the procedure.  After prepping with Hibiclens, the offending edge of the nail was freed from the nailbed and perionychium, and then split with scissors and removed with  forceps.  All visible granulation tissue is debrided. Antibiotic and bulky dressing was  applied.   Findings:   Complications: none.  Plan: 1. Soak the foot twice daily. Change dressing twice daily until healed over. 2. Warning signs of infection were reviewed.   3. Recommended that the patient use OTC acetaminophen as needed for pain.  4. Return as needed.

## 2015-10-02 ENCOUNTER — Other Ambulatory Visit: Payer: Self-pay | Admitting: Family Medicine

## 2015-10-30 ENCOUNTER — Other Ambulatory Visit: Payer: Self-pay | Admitting: Family Medicine

## 2015-11-03 ENCOUNTER — Other Ambulatory Visit: Payer: Self-pay | Admitting: Family Medicine

## 2015-11-04 ENCOUNTER — Other Ambulatory Visit: Payer: Self-pay | Admitting: *Deleted

## 2015-11-04 MED ORDER — POTASSIUM CHLORIDE CRYS ER 10 MEQ PO TBCR: EXTENDED_RELEASE_TABLET | ORAL | Status: DC

## 2015-11-05 LAB — LIPID PANEL
Cholesterol: 165 mg/dL (ref 125–200)
HDL: 74 mg/dL (ref >=46)
LDL Cholesterol: 78 mg/dL (ref <130)
Total CHOL/HDL Ratio: 2.2 Ratio (ref <=5.0)
Triglycerides: 65 mg/dL (ref <150)
VLDL: 13 mg/dL (ref <30)

## 2015-12-05 ENCOUNTER — Ambulatory Visit: Payer: Medicare Other | Admitting: Family Medicine

## 2015-12-06 ENCOUNTER — Ambulatory Visit (INDEPENDENT_AMBULATORY_CARE_PROVIDER_SITE_OTHER): Payer: Medicare Other | Admitting: Family Medicine

## 2015-12-06 ENCOUNTER — Encounter: Payer: Self-pay | Admitting: Family Medicine

## 2015-12-06 VITALS — BP 118/55 | HR 83 | Resp 16 | Ht 60.0 in | Wt 222.0 lb

## 2015-12-06 DIAGNOSIS — M79606 Pain in leg, unspecified: Secondary | ICD-10-CM | POA: Diagnosis not present

## 2015-12-06 DIAGNOSIS — M7989 Other specified soft tissue disorders: Principal | ICD-10-CM

## 2015-12-06 MED ORDER — NYSTATIN 100000 UNIT/GM EX POWD: 1.0000 g | Freq: Two times a day (BID) | CUTANEOUS | 6 refills | Status: DC

## 2015-12-06 NOTE — Progress Notes (Signed)
Subjective:    CC: LE swelling x 2 weeks.    HPI:  Patient c/o of LE swelling.  L worse than R.  Had spinal injection done around time it started.  She denies any recent changes in medications. No trauma. She has had some urinary urgency and frequency but no dysuria or hematuria. No fevers or chills. No redness or increased heat to the swelling. No chest pain or shortness of breath. Weight is up 3 pounds from last office visit.  He does feel like her groin rash is coming back over the summer. She would like a refill on the nystatin powder. She is getting ready to leave town on Monday for a waiting up [place].  Past medical history, Surgical history, Family history not pertinant except as noted below, Social history, Allergies, and medications have been entered into the medical record, reviewed, and corrections made.   Review of Systems: No fevers, chills, night sweats, weight loss, chest pain, or shortness of breath.    Objective:    Vitals:   12/06/15 1011  Weight: 222 lb (100.7 kg)  Height: 5' (1.524 m)     General: Well Developed, well nourished, and in no acute distress.  Neuro: Alert and oriented x3, extra-ocular muscles intact, sensation grossly intact.  HEENT: Normocephalic, atraumatic  Skin: Warm and dry, no rashes. Cardiac: Regular rate and rhythm, no murmurs rubs or gallops, no lower extremity edema. No carotid bruits. Respiratory: Clear to auscultation bilaterally. Not using accessory muscles, speaking in full sentences. Ext: She does have 1+ edema around her left ankle and trace edema on the right. Nontender calf squeeze bilaterally. Abdomen: Soft, nontender, no organomegaly   Impression and Recommendations:   LE edema - Unclear etiology at this point. May just be secondary to the heat of the summer concomitant with venous stasis. But will do further evaluation to rule out proteinuria and protein loss. Will check thyroid as well as for anemia. She is specifically concerned  about her heart so we'll check a B in people. I do not see any symptoms or signs of a DVT but will keep that in mind if her symptoms get worse or persist.  Groin candidiasis-will treat with nystatin powder. New perception sent to pharmacy.

## 2015-12-07 LAB — COMPLETE METABOLIC PANEL WITH GFR
ALT: 24 U/L (ref 6–29)
AST: 26 U/L (ref 10–35)
Albumin: 3.9 g/dL (ref 3.6–5.1)
Alkaline Phosphatase: 69 U/L (ref 33–130)
BUN: 19 mg/dL (ref 7–25)
CO2: 29 mmol/L (ref 20–31)
Calcium: 9.6 mg/dL (ref 8.6–10.4)
Chloride: 104 mmol/L (ref 98–110)
Creat: 0.79 mg/dL (ref 0.60–0.93)
GFR, Est African American: 86 mL/min (ref >=60)
GFR, Est Non African American: 74 mL/min (ref >=60)
Glucose, Bld: 86 mg/dL (ref 65–99)
Potassium: 4.5 mmol/L (ref 3.5–5.3)
Sodium: 141 mmol/L (ref 135–146)
Total Bilirubin: 0.6 mg/dL (ref 0.2–1.2)
Total Protein: 6.2 g/dL (ref 6.1–8.1)

## 2015-12-07 LAB — BRAIN NATRIURETIC PEPTIDE: Brain Natriuretic Peptide: 5 pg/mL (ref <100)

## 2015-12-07 LAB — CBC WITH DIFFERENTIAL/PLATELET
Basophils Absolute: 47 cells/uL (ref 0–200)
Basophils Relative: 1 %
Eosinophils Absolute: 141 cells/uL (ref 15–500)
Eosinophils Relative: 3 %
HCT: 34.5 % — ABNORMAL LOW (ref 35.0–45.0)
Hemoglobin: 11.4 g/dL — ABNORMAL LOW (ref 11.7–15.5)
Lymphocytes Relative: 36 %
Lymphs Abs: 1692 cells/uL (ref 850–3900)
MCH: 30.9 pg (ref 27.0–33.0)
MCHC: 33 g/dL (ref 32.0–36.0)
MCV: 93.5 fL (ref 80.0–100.0)
MPV: 8.9 fL (ref 7.5–12.5)
Monocytes Absolute: 329 cells/uL (ref 200–950)
Monocytes Relative: 7 %
Neutro Abs: 2491 cells/uL (ref 1500–7800)
Neutrophils Relative %: 53 %
Platelets: 261 10*3/uL (ref 140–400)
RBC: 3.69 MIL/uL — ABNORMAL LOW (ref 3.80–5.10)
RDW: 14.7 % (ref 11.0–15.0)
WBC: 4.7 10*3/uL (ref 3.8–10.8)

## 2015-12-07 LAB — URINALYSIS, ROUTINE W REFLEX MICROSCOPIC
Bilirubin Urine: NEGATIVE
Glucose, UA: NEGATIVE
Hgb urine dipstick: NEGATIVE
Ketones, ur: NEGATIVE
Leukocytes, UA: NEGATIVE
Nitrite: NEGATIVE
Protein, ur: NEGATIVE
Specific Gravity, Urine: 1.018 (ref 1.001–1.035)
pH: 7 (ref 5.0–8.0)

## 2015-12-07 LAB — MICROALBUMIN / CREATININE URINE RATIO
Creatinine, Urine: 87 mg/dL (ref 20–320)
Microalb Creat Ratio: 2 mcg/mg creat (ref <30)
Microalb, Ur: 0.2 mg/dL

## 2015-12-07 LAB — TSH: TSH: 0.53 mIU/L

## 2015-12-11 ENCOUNTER — Telehealth: Payer: Self-pay | Admitting: Family Medicine

## 2015-12-11 NOTE — Telephone Encounter (Signed)
Pt called clinic to state the swelling in both feet is still present, the left foot is worse. Pt questions if she needs to be on an additional diuretic. She is supposed to ride to [place] for a wedding this weekend, would like some relief prior to that trip.

## 2015-12-12 MED ORDER — FUROSEMIDE 20 MG PO TABS: 20.0000 mg | ORAL_TABLET | Freq: Every day | ORAL | 0 refills | Status: DC | PRN

## 2015-12-12 NOTE — Telephone Encounter (Signed)
Patient advised.

## 2015-12-12 NOTE — Telephone Encounter (Signed)
Small prescription sent for Lasix to use once a day as needed. Continue to work on really monitoring salt intake, continuing the compression, and keep feet elevated when able.

## 2015-12-25 ENCOUNTER — Other Ambulatory Visit: Payer: Self-pay | Admitting: Family Medicine

## 2015-12-26 ENCOUNTER — Encounter: Payer: Self-pay | Admitting: Family Medicine

## 2015-12-26 ENCOUNTER — Ambulatory Visit (INDEPENDENT_AMBULATORY_CARE_PROVIDER_SITE_OTHER): Payer: Medicare Other | Admitting: Family Medicine

## 2015-12-26 VITALS — BP 124/51 | HR 88 | Ht 60.0 in | Wt 220.0 lb

## 2015-12-26 DIAGNOSIS — R8290 Unspecified abnormal findings in urine: Secondary | ICD-10-CM

## 2015-12-26 DIAGNOSIS — I10 Essential (primary) hypertension: Secondary | ICD-10-CM

## 2015-12-26 DIAGNOSIS — E119 Type 2 diabetes mellitus without complications: Secondary | ICD-10-CM | POA: Diagnosis not present

## 2015-12-26 DIAGNOSIS — R600 Localized edema: Secondary | ICD-10-CM

## 2015-12-26 LAB — POCT GLYCOSYLATED HEMOGLOBIN (HGB A1C): Hemoglobin A1C: 5.9

## 2015-12-26 NOTE — Progress Notes (Signed)
  Subjective:    CC: DM  HPI:  Diabetes - no hypoglycemic events. No wounds or sores that are not healing well. No increased thirst or urination. Checking glucose at home. Taking medications as prescribed without any side effects.  Hypertension- Pt denies chest pain, SOB, dizziness, or heart palpitations.  Taking meds as directed w/o problems.  Denies medication side effects.    Lower extremity edema-started over the summer. She called and requested a prescription for Lasix. She said she did take it for 2 days and responded really well to it. She stopped it after 2 days though because she started to get some leg pain and cramping that night. She says her swelling is actually better today even though she hasn't taken medicine in a couple of days. She just wants to make sure it's not her heart. She does occasionally experience some shortness of breath with activity but this is not new. And she has not had any chest pain.He says she has gotten swelling like this in the summertime over the last several years but this year was the worst.  Past medical history, Surgical history, Family history not pertinant except as noted below, Social history, Allergies, and medications have been entered into the medical record, reviewed, and corrections made.   Review of Systems: No fevers, chills, night sweats, weight loss, chest pain, or shortness of breath.   Objective:    General: Well Developed, well nourished, and in no acute distress.  Neuro: Alert and oriented x3, extra-ocular muscles intact, sensation grossly intact.  HEENT: Normocephalic, atraumatic  Skin: Warm and dry, no rashes. Cardiac: Regular rate and rhythm, no murmurs rubs or gallops, no lower extremity edema.  Respiratory: Clear to auscultation bilaterally. Not using accessory muscles, speaking in full sentences.   Impression and Recommendations:    DM- Well controlled. Continue current regimen. Follow up in  3-4 months. She reports that  she has been for her eye exam we just need to call and get the most updated report.  Lab Results  Component Value Date   HGBA1C 5.9 12/26/2015   HTN - Well controlled. Continue current regimen. Follow up in  6 mo.   Lower extremity edema-will check BNP since patient is worried about heart failure. I am less suspicious that she was not having any significant shortness of breath or chest pain with the episodes. I do recommend compression stockings and avoiding getting overheated and avoiding excess salt. Recommend elevating the legs when she is able to.  Reminded her that her last colonoscopy was 10 years ago in 2007 and that she is due for repeat.

## 2015-12-27 LAB — POCT URINALYSIS DIPSTICK
Bilirubin, UA: NEGATIVE
Blood, UA: NEGATIVE
Glucose, UA: NEGATIVE
Ketones, UA: NEGATIVE
Leukocytes, UA: NEGATIVE
Nitrite, UA: NEGATIVE
Protein, UA: NEGATIVE
Spec Grav, UA: 1.02
Urobilinogen, UA: 0.2
pH, UA: 6.5

## 2015-12-27 LAB — BRAIN NATRIURETIC PEPTIDE: Brain Natriuretic Peptide: 8.4 pg/mL (ref <100)

## 2015-12-27 LAB — BASIC METABOLIC PANEL WITH GFR
BUN: 20 mg/dL (ref 7–25)
CO2: 28 mmol/L (ref 20–31)
Calcium: 9.6 mg/dL (ref 8.6–10.4)
Chloride: 103 mmol/L (ref 98–110)
Creat: 0.77 mg/dL (ref 0.60–0.93)
GFR, Est African American: 89 mL/min (ref >=60)
GFR, Est Non African American: 77 mL/min (ref >=60)
Glucose, Bld: 107 mg/dL — ABNORMAL HIGH (ref 65–99)
Potassium: 4.3 mmol/L (ref 3.5–5.3)
Sodium: 140 mmol/L (ref 135–146)

## 2015-12-27 NOTE — Addendum Note (Signed)
Addended by: Deno Etienne on: 12/27/2015 04:06 PM   Modules accepted: Orders

## 2015-12-27 NOTE — Progress Notes (Signed)
All labs are normal. 

## 2016-01-03 ENCOUNTER — Ambulatory Visit: Payer: Medicare Other | Admitting: Family Medicine

## 2016-01-15 ENCOUNTER — Other Ambulatory Visit: Payer: Self-pay | Admitting: Family Medicine

## 2016-02-03 ENCOUNTER — Other Ambulatory Visit: Payer: Self-pay | Admitting: Family Medicine

## 2016-02-24 ENCOUNTER — Other Ambulatory Visit: Payer: Self-pay | Admitting: Family Medicine

## 2016-02-26 ENCOUNTER — Other Ambulatory Visit: Payer: Self-pay | Admitting: Family Medicine

## 2016-03-24 ENCOUNTER — Other Ambulatory Visit: Payer: Self-pay | Admitting: Family Medicine

## 2016-03-25 ENCOUNTER — Telehealth: Payer: Self-pay

## 2016-03-25 ENCOUNTER — Other Ambulatory Visit: Payer: Self-pay

## 2016-03-25 MED ORDER — VITAMIN D (ERGOCALCIFEROL) 1.25 MG (50000 UNIT) PO CAPS: ORAL_CAPSULE | ORAL | 2 refills | Status: DC

## 2016-03-25 NOTE — Telephone Encounter (Signed)
Sent and notified patient. 

## 2016-03-25 NOTE — Telephone Encounter (Signed)
Ok to refill 

## 2016-03-26 ENCOUNTER — Other Ambulatory Visit: Payer: Self-pay | Admitting: Family Medicine

## 2016-03-26 MED ORDER — ROSUVASTATIN CALCIUM 10 MG PO TABS: 10.0000 mg | ORAL_TABLET | Freq: Every day | ORAL | 11 refills | Status: DC

## 2016-03-26 NOTE — Progress Notes (Signed)
Notification received from [facility] requesting to place her on a statin. Evidently they spoke to her and she was comfortable with starting statin therapy. She had tried Lipitor in the past. We'll start with Crestor.

## 2016-04-02 ENCOUNTER — Telehealth: Payer: Self-pay

## 2016-04-02 NOTE — Telephone Encounter (Signed)
Pt wants to know why she was Rx Crestor. She stated that she has never been on this med. She says that her cholesterol has always been good. Please advise.

## 2016-04-03 NOTE — Telephone Encounter (Signed)
Call patient: The recommendation comes from the fact that she has diabetes. The current guidelines recommend that all diabetic patients even if cholesterol numbers are okay be on a cholesterol lowering drug. It reduces risk of heart attack and stroke. Please tell her I apologize. I thought I had discussed it with her before I sent it but I may not have so I did not mean to not include her in the decision making for starting the medication. If she does not feel comfortable taking it them please let me know and we can cancel the order.

## 2016-04-03 NOTE — Telephone Encounter (Signed)
Pt.notified

## 2016-04-06 ENCOUNTER — Other Ambulatory Visit: Payer: Self-pay | Admitting: Family Medicine

## 2016-04-24 ENCOUNTER — Ambulatory Visit: Payer: Medicare Other | Admitting: Family Medicine

## 2016-04-30 ENCOUNTER — Ambulatory Visit: Payer: Medicare Other | Admitting: Family Medicine

## 2016-05-01 ENCOUNTER — Encounter: Payer: Self-pay | Admitting: Family Medicine

## 2016-05-01 ENCOUNTER — Ambulatory Visit (INDEPENDENT_AMBULATORY_CARE_PROVIDER_SITE_OTHER): Payer: Medicare Other | Admitting: Family Medicine

## 2016-05-01 VITALS — BP 130/68 | HR 81 | Ht 60.0 in | Wt 223.0 lb

## 2016-05-01 DIAGNOSIS — E119 Type 2 diabetes mellitus without complications: Secondary | ICD-10-CM

## 2016-05-01 DIAGNOSIS — R0602 Shortness of breath: Secondary | ICD-10-CM

## 2016-05-01 DIAGNOSIS — I878 Other specified disorders of veins: Secondary | ICD-10-CM

## 2016-05-01 DIAGNOSIS — I10 Essential (primary) hypertension: Secondary | ICD-10-CM | POA: Diagnosis not present

## 2016-05-01 LAB — POCT GLYCOSYLATED HEMOGLOBIN (HGB A1C): Hemoglobin A1C: 5.8

## 2016-05-01 MED ORDER — FUROSEMIDE 20 MG PO TABS: 20.0000 mg | ORAL_TABLET | Freq: Every day | ORAL | 0 refills | Status: DC | PRN

## 2016-05-01 NOTE — Progress Notes (Signed)
Subjective:    CC: DM  HPI: Diabetes - no hypoglycemic events. No wounds or sores that are not healing well. No increased thirst or urination. Checking glucose at home. Taking medications as prescribed without any side effects.  Hypertension- Pt denies chest pain, SOB, dizziness, or heart palpitations.  Taking meds as directed w/o problems.  Denies medication side effects.    The complains of lower extremity swelling that started about a month ago. She does get some chronic swelling and does wear compression stockings but she said is been noticeably worse particularly on the left side for about a month. She denies any recent changes to diet etc. She knows she is overweight and that may be contributing. She denies any chest pain. She says she has been a little bit more short of breath with activity but is also very sedentary and has a lot of joint pain problems that keep her from being very mobile.  She is up 3 pounds from August.   Past medical history, Surgical history, Family history not pertinant except as noted below, Social history, Allergies, and medications have been entered into the medical record, reviewed, and corrections made.   Review of Systems: No fevers, chills, night sweats, weight loss, chest pain, or shortness of breath.   Objective:    General: Well Developed, well nourished, and in no acute distress.  Neuro: Alert and oriented x3, extra-ocular muscles intact, sensation grossly intact.  HEENT: Normocephalic, atraumatic  Skin: Warm and dry, no rashes. Cardiac: Regular rate and rhythm, no murmurs rubs or gallops, no lower extremity edema.  Respiratory: Clear to auscultation bilaterally. Not using accessory muscles, speaking in full sentences. Ext: 1+ edema on the left leg and trace on the right. He does have her compression stockings on.  Impression and Recommendations:   DM-  Well controlled. Continue current regimen. Follow up in  4 mo.   HTN - Well controlled.  Continue current regimen. Follow up in  4 mo.   SOB - will check CBC and BNP.  Lungs are clear on exam today. No sign of volume overload.  Venous stasis-will add back her Lasix which she was previously on. Encouraged her to use it once or twice a week but not more than that. If she feels she is needing it more regularly than we will need to discontinue the hydrochlorothiazide and just use the Lasix. We'll check a BMP just to rule out heart failure. Check BMP at follow-up.

## 2016-05-02 LAB — CBC WITH DIFFERENTIAL/PLATELET
Basophils Absolute: 60 cells/uL (ref 0–200)
Basophils Relative: 1 %
Eosinophils Absolute: 240 cells/uL (ref 15–500)
Eosinophils Relative: 4 %
HCT: 36.6 % (ref 35.0–45.0)
Hemoglobin: 12 g/dL (ref 11.7–15.5)
Lymphocytes Relative: 41 %
Lymphs Abs: 2460 cells/uL (ref 850–3900)
MCH: 30.3 pg (ref 27.0–33.0)
MCHC: 32.8 g/dL (ref 32.0–36.0)
MCV: 92.4 fL (ref 80.0–100.0)
MPV: 9.6 fL (ref 7.5–12.5)
Monocytes Absolute: 420 cells/uL (ref 200–950)
Monocytes Relative: 7 %
Neutro Abs: 2820 cells/uL (ref 1500–7800)
Neutrophils Relative %: 47 %
Platelets: 273 10*3/uL (ref 140–400)
RBC: 3.96 MIL/uL (ref 3.80–5.10)
RDW: 15.1 % — ABNORMAL HIGH (ref 11.0–15.0)
WBC: 6 10*3/uL (ref 3.8–10.8)

## 2016-05-02 LAB — COMPLETE METABOLIC PANEL WITH GFR
ALT: 27 U/L (ref 6–29)
AST: 26 U/L (ref 10–35)
Albumin: 3.8 g/dL (ref 3.6–5.1)
Alkaline Phosphatase: 77 U/L (ref 33–130)
BUN: 15 mg/dL (ref 7–25)
CO2: 29 mmol/L (ref 20–31)
Calcium: 9.3 mg/dL (ref 8.6–10.4)
Chloride: 103 mmol/L (ref 98–110)
Creat: 0.6 mg/dL (ref 0.60–0.93)
GFR, Est African American: >89 mL/min (ref >=60)
GFR, Est Non African American: >89 mL/min (ref >=60)
Glucose, Bld: 74 mg/dL (ref 65–99)
Potassium: 4.1 mmol/L (ref 3.5–5.3)
Sodium: 141 mmol/L (ref 135–146)
Total Bilirubin: 0.6 mg/dL (ref 0.2–1.2)
Total Protein: 6.1 g/dL (ref 6.1–8.1)

## 2016-05-02 LAB — BRAIN NATRIURETIC PEPTIDE: Brain Natriuretic Peptide: 13.6 pg/mL (ref <100)

## 2016-05-04 ENCOUNTER — Other Ambulatory Visit: Payer: Self-pay | Admitting: *Deleted

## 2016-05-15 ENCOUNTER — Other Ambulatory Visit: Payer: Self-pay | Admitting: Family Medicine

## 2016-05-23 ENCOUNTER — Other Ambulatory Visit: Payer: Self-pay | Admitting: Family Medicine

## 2016-05-28 ENCOUNTER — Other Ambulatory Visit: Payer: Self-pay | Admitting: Family Medicine

## 2016-06-20 ENCOUNTER — Other Ambulatory Visit: Payer: Self-pay | Admitting: Family Medicine

## 2016-06-23 ENCOUNTER — Other Ambulatory Visit: Payer: Self-pay | Admitting: Family Medicine

## 2016-06-25 ENCOUNTER — Other Ambulatory Visit: Payer: Self-pay | Admitting: Family Medicine

## 2016-06-30 ENCOUNTER — Encounter: Payer: Self-pay | Admitting: Family Medicine

## 2016-06-30 ENCOUNTER — Ambulatory Visit (INDEPENDENT_AMBULATORY_CARE_PROVIDER_SITE_OTHER): Payer: Medicare Other | Admitting: Family Medicine

## 2016-06-30 VITALS — BP 129/51 | HR 71 | Ht 60.0 in | Wt 223.0 lb

## 2016-06-30 DIAGNOSIS — J209 Acute bronchitis, unspecified: Secondary | ICD-10-CM | POA: Diagnosis not present

## 2016-06-30 MED ORDER — AZITHROMYCIN 250 MG PO TABS: ORAL_TABLET | ORAL | 0 refills | Status: AC

## 2016-06-30 MED ORDER — PREDNISONE 20 MG PO TABS: 40.0000 mg | ORAL_TABLET | Freq: Every day | ORAL | 0 refills | Status: DC

## 2016-06-30 NOTE — Progress Notes (Signed)
   Subjective:    Patient ID: Joann Maddox, female    DOB: 1940-12-09, 76 y.o.   MRN: [id number]  HPI 2 weeks of URI sxs.  Feels like moving into her chest.  Occ productive + with colored sputum.+ chills, no fever.  Hearing a wheezing sound in her chest, worse in AM. Taking throat lozenges and Advil for relief.  No worsening or alleviating sxs.  NO GI sxs.     Review of Systems     Objective:   Physical Exam  Constitutional: She is oriented to person, place, and time. She appears well-developed and well-nourished.  HENT:  Head: Normocephalic and atraumatic.  Right Ear: External ear normal.  Left Ear: External ear normal.  Nose: Nose normal.  Mouth/Throat: Oropharynx is clear and moist.  TMs and canals are clear.   Eyes: Conjunctivae and EOM are normal. Pupils are equal, round, and reactive to light.  Neck: Neck supple. No thyromegaly present.  Cardiovascular: Normal rate, regular rhythm and normal heart sounds.   Pulmonary/Chest: Effort normal and breath sounds normal. She has no wheezes.  Lymphadenopathy:    She has no cervical adenopathy.  Neurological: She is alert and oriented to person, place, and time.  Skin: Skin is warm and dry.  Psychiatric: She has a normal mood and affect.        Assessment & Plan:  Acute bronchitis-she does have some wheezing in the right posterior lower chest field as well as left anterior upper chest field. We'll go ahead and start azithromycin for possible early pneumonia as well as prednisone for 5 days. If not significantly better by Friday and she will call the office back and we will schedule her for a chest x-ray before the weekend.

## 2016-07-10 ENCOUNTER — Other Ambulatory Visit: Payer: Self-pay | Admitting: *Deleted

## 2016-07-10 ENCOUNTER — Other Ambulatory Visit: Payer: Self-pay | Admitting: Family Medicine

## 2016-07-20 ENCOUNTER — Ambulatory Visit (INDEPENDENT_AMBULATORY_CARE_PROVIDER_SITE_OTHER): Payer: Medicare Other | Admitting: Sports Medicine

## 2016-07-20 ENCOUNTER — Encounter: Payer: Self-pay | Admitting: Sports Medicine

## 2016-07-20 ENCOUNTER — Ambulatory Visit (INDEPENDENT_AMBULATORY_CARE_PROVIDER_SITE_OTHER): Payer: Medicare Other

## 2016-07-20 DIAGNOSIS — G8929 Other chronic pain: Secondary | ICD-10-CM

## 2016-07-20 DIAGNOSIS — M25512 Pain in left shoulder: Secondary | ICD-10-CM | POA: Diagnosis not present

## 2016-07-20 DIAGNOSIS — M5412 Radiculopathy, cervical region: Secondary | ICD-10-CM | POA: Diagnosis not present

## 2016-07-20 DIAGNOSIS — M7502 Adhesive capsulitis of left shoulder: Secondary | ICD-10-CM

## 2016-07-20 DIAGNOSIS — M47812 Spondylosis without myelopathy or radiculopathy, cervical region: Secondary | ICD-10-CM | POA: Insufficient documentation

## 2016-07-20 MED ORDER — MELOXICAM 15 MG PO TABS: ORAL_TABLET | ORAL | 3 refills | Status: DC

## 2016-07-20 NOTE — Assessment & Plan Note (Signed)
Glenohumeral injection as above, Mobic, formal physical therapy. Return in one month.

## 2016-07-20 NOTE — Progress Notes (Signed)
Procedure: Real-time Ultrasound Guided Injection of left glenohumeral joint Device: GE Logiq E  Verbal informed consent obtained.  Time-out conducted.  Noted no overlying erythema, induration, or other signs of local infection.  Skin prepped in a sterile fashion.  Local anesthesia: Topical Ethyl chloride.  With sterile technique and under real time ultrasound guidance:  Noted severe osteoarthritis, 22-gauge spinal needle advanced into the joint and 1 mL kenalog 40, 2 mL lidocaine, 2 mL bupivacaine injected easily. Completed without difficulty  Pain immediately resolved suggesting accurate placement of the medication.  Advised to call if fevers/chills, erythema, induration, drainage, or persistent bleeding.  Images permanently stored and available for review in the ultrasound unit.  Impression: Technically successful ultrasound guided injection.  MRI personally reviewed, multilevel protrusion discs the worst at the C5-6 level with protrusion causing indention of the anterior thecal sac at this level on the left.

## 2016-07-20 NOTE — Assessment & Plan Note (Signed)
Multiple protruding discs the worst is at C5-C6 with a leftward component and impingement on the thecal sac. Formal physical therapy, we will proceed with an epidural if insufficient relief.

## 2016-07-20 NOTE — Progress Notes (Signed)
   Subjective:    I'm seeing this patient as a consultation for:  Dr. Nani Gasser  CC: neck and shoulder pain  HPI: Joann Maddox is a 76 y.o. Female with a history of RA and generalized osteoarthritis who presents with neck and left shoulder pain. She says the pain has persisted for at least a year. She says the neck pain is the most severe and is constantly there. She says that her shoulder hurts when she tries to move it too far because of her frozen shoulder. Reports intermittent tingling down her lateral arm when she moves her arm too far.  Past medical history:  Adhesive capsulitis, rheumatoid arthritis, osteoarthritis.  See flowsheet/record as well for more information.  Surgical history: Negative.  See flowsheet/record as well for more information.  Family history: Negative.  See flowsheet/record as well for more information.  Social history: Negative.  See flowsheet/record as well for more information.  Allergies, and medications have been entered into the medical record, reviewed, and no changes needed.   Review of Systems: No headache, visual changes, nausea, vomiting, diarrhea, constipation, dizziness, abdominal pain, skin rash, fevers, chills, night sweats, weight loss, swollen lymph nodes, body aches, joint swelling, muscle aches, chest pain, shortness of breath, mood changes, visual or auditory hallucinations.   Objective:   General: Well Developed, well nourished, and in no acute distress.  Neuro/Psych: Alert and oriented x3, extra-ocular muscles intact, able to move all 4 extremities, sensation grossly intact. Skin: Warm and dry, no rashes noted.  Respiratory: Not using accessory muscles, speaking in full sentences, trachea midline.  Cardiovascular: Pulses palpable, no extremity edema. Abdomen: Does not appear distended. Musculoskeletal: Left shoulder ROM and strength against resistence severely reduced on abduction, flexion and external rotation. TTP primarily at  glenohumeral joint. TTP at midline of the neck.    Impression and Recommendations:   This case required medical decision making of moderate complexity.  Joann Maddox is a 75 y.o. Female with a history of OA and RA who presents with left cervical radiculopathy and left adhesive capsulitis. Glenohumeral injection placed. Pt prescribed Meloxicam. Ordered shoulder x-rays. Pt also referred to PT. Follow up in 1 month.

## 2016-07-25 ENCOUNTER — Other Ambulatory Visit: Payer: Self-pay | Admitting: Family Medicine

## 2016-07-29 ENCOUNTER — Encounter: Payer: Self-pay | Admitting: Rehabilitative and Restorative Service Providers"

## 2016-07-29 ENCOUNTER — Ambulatory Visit (INDEPENDENT_AMBULATORY_CARE_PROVIDER_SITE_OTHER): Payer: Medicare Other | Admitting: Rehabilitative and Restorative Service Providers"

## 2016-07-29 DIAGNOSIS — R531 Weakness: Secondary | ICD-10-CM | POA: Diagnosis not present

## 2016-07-29 DIAGNOSIS — M542 Cervicalgia: Secondary | ICD-10-CM | POA: Diagnosis not present

## 2016-07-29 DIAGNOSIS — G8929 Other chronic pain: Secondary | ICD-10-CM | POA: Diagnosis not present

## 2016-07-29 DIAGNOSIS — R29898 Other symptoms and signs involving the musculoskeletal system: Secondary | ICD-10-CM | POA: Diagnosis not present

## 2016-07-29 DIAGNOSIS — M25512 Pain in left shoulder: Secondary | ICD-10-CM

## 2016-07-29 NOTE — Patient Instructions (Signed)
Axial Extension (Chin Tuck)    Pull chin in and lengthen back of neck. Hold __10__ seconds while counting out loud. Repeat _5-10___ times. Do _several_ sessions per day.   Shoulder Blade Squeeze    Rotate shoulders back, then squeeze shoulder blades down and back. Hold 10 sec Repeat __10_ times. Do __several __ sessions per day. Sitting with noodle along spine   Trigger Point Dry Needling  . What is Trigger Point Dry Needling (DN)? o DN is a physical therapy technique used to treat muscle pain and dysfunction. Specifically, DN helps deactivate muscle trigger points (muscle knots).  o A thin filiform needle is used to penetrate the skin and stimulate the underlying trigger point. The goal is for a local twitch response (LTR) to occur and for the trigger point to relax. No medication of any kind is injected during the procedure.   . What Does Trigger Point Dry Needling Feel Like?  o The procedure feels different for each individual patient. Some patients report that they do not actually feel the needle enter the skin and overall the process is not painful. Very mild bleeding may occur. However, many patients feel a deep cramping in the muscle in which the needle was inserted. This is the local twitch response.   Marland Kitchen How Will I feel after the treatment? o Soreness is normal, and the onset of soreness may not occur for a few hours. Typically this soreness does not last longer than two days.  o Bruising is uncommon, however; ice can be used to decrease any possible bruising.  o In rare cases feeling tired or nauseous after the treatment is normal. In addition, your symptoms may get worse before they get better, this period will typically not last longer than 24 hours.   . What Can I do After My Treatment? o Increase your hydration by drinking more water for the next 24 hours. o You may place ice or heat on the areas treated that have become sore, however, do not use heat on inflamed or  bruised areas. Heat often brings more relief post needling. o You can continue your regular activities, but vigorous activity is not recommended initially after the treatment for 24 hours. o DN is best combined with other physical therapy such as strengthening, stretching, and other therapies.

## 2016-07-29 NOTE — Therapy (Signed)
[facility] [place] [address] [place], [place], [address] Phone: [phone]   Fax:  [phone]  Physical Therapy Treatment  Patient Details  Name: Joann Maddox MRN: [id number] Date of Birth: October 04, 1940 Referring Provider: Dr Benjamin Stain  Encounter Date: 07/29/2016      PT End of Session - 07/29/16 1622    Visit Number 1   Number of Visits 12   Date for PT Re-Evaluation 09/09/16   PT Start Time 1522   PT Stop Time 1629   PT Time Calculation (min) 67 min   Activity Tolerance Patient tolerated treatment well      Past Medical History:  Diagnosis Date  . Anxiety   . Headache(784.0)    migrains  . Heart murmur   . Hypothyroidism   . Rheumatoid arthritis(714.0)     Past Surgical History:  Procedure Laterality Date  . ABDOMINAL HYSTERECTOMY    . APPENDECTOMY    . CHOLECYSTECTOMY    . REPLACEMENT TOTAL KNEE BILATERAL    . TONSILLECTOMY     as  a child  . TOTAL HIP ARTHROPLASTY     right and left    There were no vitals filed for this visit.      Subjective Assessment - 07/29/16 1528    Subjective Patient reports that she has had Lt shoulder pain over the past two years but symptoms have worsened over the past 5 months. She has noticed pain; stiffness; and increased pain. She has significant pain in the cervical pain which has been severe in the past two months. She has pain and cracking "all the time" in her neck   Pertinent History multiple joint arthritis; bilat hip replacements and bilat knee replacements in the past 12 years - most recent joint replacement ~ 4-5 years ago; LBP; DDD cervical spine    How long can you sit comfortably? recliner 1-2 hours with pillow at neck; straight chair ~45 -60 min    How long can you stand comfortably? 3-5 min    How long can you walk comfortably? not at all    Diagnostic tests xrays    Patient Stated Goals eliminate the pain in the neck and loosen the shoulder up     Currently in Pain? Yes   Pain Score 7    Pain Location Shoulder   Pain Orientation Left   Pain Descriptors / Indicators Tightness;Sore;Sharp  sharp with movement    Pain Type Chronic pain   Pain Onset More than a month ago   Pain Frequency Constant   Aggravating Factors  reaching behind back or out in front; lying on the Lt side; elevatiing arm; lifting    Pain Relieving Factors ice; TENS unit; meds   Multiple Pain Sites Yes   Pain Score 8   Pain Location Neck   Pain Orientation Left   Pain Descriptors / Indicators Sharp;Throbbing   Pain Type Chronic pain   Pain Onset More than a month ago   Pain Frequency Constant   Aggravating Factors  sitting in certain chairs; sitting without head and neck supported; standing   Pain Relieving Factors ice; heat; TENS; meds            [facility] PT Assessment - 07/29/16 0001      Assessment   Medical Diagnosis Lt adhesive capsulitis; cervical dysfunction    Referring Provider Dr Benjamin Stain   Onset Date/Surgical Date 03/11/16   Hand Dominance Right   Next MD Visit 08/17/16   Prior  Therapy none for neck and shoulder      Precautions   Precautions None     Balance Screen   Has the patient fallen in the past 6 months Yes   How many times? 4    Has the patient had a decrease in activity level because of a fear of falling?  No   Is the patient reluctant to leave their home because of a fear of falling?  No     Home Environment   Additional Comments single level home - level entry      Prior Function   Level of Independence Independent with basic ADLs   Vocation Retired   Buyer, retail - retired  1994    Leisure sedentary - some light cooking; Pharmacologist; TV; computer      Observation/Other Assessments   Focus on Therapeutic Outcomes (FOTO)  59% limitation      Sensation   Additional Comments WFL's per pt report      Posture/Postural Control   Posture Comments head forward; shoudlers rounded and elevated; scaopulae  abducted and rotated along the thoracic wall; head of the humerus anterior in orientation     AROM   Right Shoulder Extension 68 Degrees   Right Shoulder Flexion 136 Degrees   Right Shoulder ABduction 121 Degrees   Right Shoulder Internal Rotation 61 Degrees   Right Shoulder External Rotation 74 Degrees   Left Shoulder Extension 47 Degrees   Left Shoulder Flexion 91 Degrees  painful - patterns of substitution Lt scapula   Left Shoulder ABduction 70 Degrees  pain - patterns of substitution Lt scapula    Left Shoulder Internal Rotation 63 Degrees   Left Shoulder External Rotation 54 Degrees  pain - patterns of substitution    Cervical Flexion 48   Cervical Extension 13  pain   Cervical - Right Side Bend 24  pain Lt cervical area    Cervical - Left Side Bend 22  pain Lt cervical area    Cervical - Right Rotation 51  pain    Cervical - Left Rotation 32  pain Lt cervical area     Strength   Right Shoulder Flexion 4+/5   Right Shoulder Extension 4+/5   Right Shoulder ABduction 4+/5   Right Shoulder Internal Rotation 4/5   Right Shoulder External Rotation 4/5   Left Shoulder Flexion 3+/5   Left Shoulder Extension 4/5   Left Shoulder ABduction 3+/5   Left Shoulder Internal Rotation 4-/5   Left Shoulder External Rotation 3+/5     Palpation   Spinal mobility tenderness and tightness through the cervical and upper thoracic spine    Palpation comment significant muscular tightness Lt > Rt upper quarter including upper traps; leveator; pecs; ant/lat/posterior cervica musculature; anterior deltoid; biceps                      [facility] Adult PT Treatment/Exercise - 07/29/16 0001      Self-Care   Self-Care --  education re modification of recliner      Neuro Re-ed    Neuro Re-ed Details  initiated work on posture and alignment - attempting to facilitate posterior shoudler girdle musculature and extend trunk/hips/knees      Shoulder Exercises: Seated   Other Seated  Exercises axial extension 10 sec x 5 head supported on pillow at wall    Other Seated Exercises scap squeeze 10 sec x 10 with small swim noodle; head supported on pillow at wall  Shoulder Exercises: Pulleys   Flexion --  10 sec x 5   ABduction --  10 sec x 5      Moist Heat Therapy   Number Minutes Moist Heat 20 Minutes   Moist Heat Location Cervical;Shoulder  Lt      Electrical Stimulation   Electrical Stimulation Location Lt lateral cervical; Lt shoulder    Electrical Stimulation Action IFC   Electrical Stimulation Parameters to tolerance   Electrical Stimulation Goals Pain;Tone  muscular tightness                 PT Education - 07/29/16 1556    Education provided Yes   Education Details HEP DN   Person(s) Educated Patient;Spouse   Methods Explanation;Demonstration;Tactile cues;Verbal cues;Handout   Comprehension Verbalized understanding;Returned demonstration;Verbal cues required;Tactile cues required             PT Long Term Goals - 07/29/16 1630      PT LONG TERM GOAL #1   Title Increase AROM cervical spine by 5-10 degrees in all planes 09/09/16   Time 6   Period Weeks   Status New     PT LONG TERM GOAL #2   Title Increase AROM Lt shoudler by 5-15 degrees in all planes 09/09/16   Time 6   Period Weeks   Status New     PT LONG TERM GOAL #3   Title Increase strength Lt/Rt shoulders allowing patient to use UE's for more functional activities 09/09/16    Time 6   Period Weeks   Status New     PT LONG TERM GOAL #4   Title Decrease frequency, intensity, and/or duration or pain by 25-30% allowing patient to move with greater ease and participate in daily activities 09/09/16   Time 6   Period Weeks   Status New     PT LONG TERM GOAL #5   Title Independent in HEP 09/09/16   Time 6   Period Weeks   Status New     PT LONG TERM GOAL #6   Title Improve FOTO to </= 41% limitation 09/09/16   Time 6   Period Weeks   Status New                Plan - 07/29/16 1623    Clinical Impression Statement Patient presents for moderate complexity evaluation of Lt cervical pain and degenerative changes and Lt shoulder pain and dysfunction. She has very poor posture and alignment with forward flexed hips and trunk; head forward; shoulders rounded and elevated; scapulae abducted and rotated along the thoracic wall. She has decreased AROM in cervical and thoracic spine as well as Lt > Rt shoudoer ROM. Strength in bilat UE's is decreased. Patient has limited functional activity level and pain on a constant basis. She will benefit from PT to address deficits identified and improve functional activity level.    Rehab Potential Fair   PT Frequency 2x / week   PT Duration 6 weeks   PT Treatment/Interventions Patient/family education;ADLs/Self Care Home Management;Cryotherapy;Electrical Stimulation;Iontophoresis 4mg /ml Dexamethasone;Moist Heat;Ultrasound;Dry needling;Manual techniques;Therapeutic activities;Therapeutic exercise   PT Next Visit Plan continue work on posture and alignment; education re positioning and movement; postural correction exercises; ROM/strengthening as tolerated; manual work cervical and Lt shoudler girdle; trial of DN; modalities as indicated    Consulted and Agree with Plan of Care Patient      Patient will benefit from skilled therapeutic intervention in order to improve the following deficits and impairments:  Postural dysfunction,  Improper body mechanics, Pain, Increased fascial restricitons, Increased muscle spasms, Decreased range of motion, Decreased mobility, Decreased strength, Impaired UE functional use, Decreased activity tolerance  Visit Diagnosis: Cervicalgia - Plan: PT plan of care cert/re-cert  Chronic left shoulder pain - Plan: PT plan of care cert/re-cert  Other symptoms and signs involving the musculoskeletal system - Plan: PT plan of care cert/re-cert  Weakness generalized - Plan: PT plan of care  cert/re-cert     Problem List Patient Active Problem List   Diagnosis Date Noted  . Left cervical radiculopathy 07/20/2016  . 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HCC) 02/12/2015  . Type 2 diabetes mellitus without complication (HCC) 08/10/2014  . Chronic constipation 02/19/2014  . Rosacea 11/06/2013  . GAD (generalized anxiety disorder) 06/08/2013  . Essential hypertension, benign 05/04/2013  . Rheumatoid arthritis (HCC) 05/04/2013  . Migraine with aura 05/04/2013  . Palpitations 05/04/2013  . Murmur, heart 05/04/2013  . Hyperlipidemia 05/04/2013  . Hypothyroidism 05/04/2013    Lisbeth Puller Rober Minion PT, MPH  07/29/2016, 4:48 PM  [facility] [address] [place], [place], [address] Phone: [phone]   Fax:  [phone]  Name: Joann Maddox MRN: [id number] Date of Birth: 1940/05/18

## 2016-07-30 ENCOUNTER — Encounter: Payer: Self-pay | Admitting: Family Medicine

## 2016-07-30 ENCOUNTER — Ambulatory Visit (INDEPENDENT_AMBULATORY_CARE_PROVIDER_SITE_OTHER): Payer: Medicare Other | Admitting: Family Medicine

## 2016-07-30 VITALS — BP 128/55 | HR 76 | Ht 60.0 in | Wt 214.0 lb

## 2016-07-30 DIAGNOSIS — I10 Essential (primary) hypertension: Secondary | ICD-10-CM | POA: Diagnosis not present

## 2016-07-30 DIAGNOSIS — E119 Type 2 diabetes mellitus without complications: Secondary | ICD-10-CM | POA: Diagnosis not present

## 2016-07-30 DIAGNOSIS — R2681 Unsteadiness on feet: Secondary | ICD-10-CM | POA: Diagnosis not present

## 2016-07-30 DIAGNOSIS — E038 Other specified hypothyroidism: Secondary | ICD-10-CM

## 2016-07-30 LAB — POCT GLYCOSYLATED HEMOGLOBIN (HGB A1C): Hemoglobin A1C: 5.7

## 2016-07-30 NOTE — Progress Notes (Signed)
Subjective:    CC: DM,. HTN  HPI:  Diabetes - no hypoglycemic events. No wounds or sores that are not healing well. No increased thirst or urination. Checking glucose at home. Taking medications as prescribed without any side effects.  Hypertension- Pt denies chest pain, SOB, dizziness, or heart palpitations.  Taking meds as directed w/o problems.  Denies medication side effects.    Hypothyroidism-no recent skin or hair changes. She has lost 3 pounds but that's been intentional she's been trying to eat more healthy.  She also has feeling like her balance has been off. In fact she's actually fallen several times. She is in physical therapy currently for her shoulder and neck. She said she just hasn't been as mobile ever since she's had bilateral knee and hip replacements and has gotten more sedentary and feels like she's gotten more weak. She denies any actual vertigo or dizziness per se.  Past medical history, Surgical history, Family history not pertinant except as noted below, Social history, Allergies, and medications have been entered into the medical record, reviewed, and corrections made.   Review of Systems: No fevers, chills, night sweats, weight loss, chest pain, or shortness of breath.   Objective:    General: Well Developed, well nourished, and in no acute distress.  Neuro: Alert and oriented x3, extra-ocular muscles intact, sensation grossly intact.  HEENT: Normocephalic, atraumatic  Skin: Warm and dry, no rashes. Cardiac: Regular rate and rhythm, no murmurs rubs or gallops, no lower extremity edema.  Respiratory: Clear to auscultation bilaterally. Not using accessory muscles, speaking in full sentences.   Impression and Recommendations:    DM- Well controlled. Continue current regimen. Follow up in  4 months. A1C of 5.7 today.   HTN - Well controlled. Continue current regimen. Follow up in  3-4 months.She really only uses her Lasix as needed.  Gait instability with  frequent falls-we'll add an order for physical therapy to add gait and balance training.  We'll go ahead and place order for mammogram as she is due in about 2 weeks.  Hypothyroidism-

## 2016-07-31 LAB — BASIC METABOLIC PANEL WITH GFR
BUN: 18 mg/dL (ref 7–25)
CO2: 23 mmol/L (ref 20–31)
Calcium: 9.5 mg/dL (ref 8.6–10.4)
Chloride: 104 mmol/L (ref 98–110)
Creat: 0.71 mg/dL (ref 0.60–0.93)
GFR, Est African American: >89 mL/min (ref >=60)
GFR, Est Non African American: 84 mL/min (ref >=60)
Glucose, Bld: 78 mg/dL (ref 65–99)
Potassium: 4.2 mmol/L (ref 3.5–5.3)
Sodium: 140 mmol/L (ref 135–146)

## 2016-07-31 LAB — TSH: TSH: 0.48 mIU/L

## 2016-07-31 NOTE — Progress Notes (Signed)
All labs are normal. 

## 2016-08-04 ENCOUNTER — Encounter: Payer: Self-pay | Admitting: Rehabilitative and Restorative Service Providers"

## 2016-08-04 ENCOUNTER — Ambulatory Visit (INDEPENDENT_AMBULATORY_CARE_PROVIDER_SITE_OTHER): Payer: Medicare Other | Admitting: Rehabilitative and Restorative Service Providers"

## 2016-08-04 DIAGNOSIS — R29898 Other symptoms and signs involving the musculoskeletal system: Secondary | ICD-10-CM

## 2016-08-04 DIAGNOSIS — R531 Weakness: Secondary | ICD-10-CM | POA: Diagnosis not present

## 2016-08-04 DIAGNOSIS — G8929 Other chronic pain: Secondary | ICD-10-CM

## 2016-08-04 DIAGNOSIS — M25512 Pain in left shoulder: Secondary | ICD-10-CM | POA: Diagnosis not present

## 2016-08-04 DIAGNOSIS — M542 Cervicalgia: Secondary | ICD-10-CM

## 2016-08-04 NOTE — Patient Instructions (Addendum)
Axial Extension (Chin Tuck)    Pull chin in and lengthen back of neck. Hold __10 sec __ seconds while counting out loud. Repeat __5-10__ times. Do __several __ sessions per day.   AROM: Lateral Neck Flexion    Slowly tilt head toward one shoulder, then the other. Hold each position _5-10___ seconds. Repeat __5__ times per set. . Do _2-3___ sessions per day.    Scapular Retraction: Elbow Flexion (Standing)    With elbows bent to 90, pinch shoulder blades together and rotate arms out, keeping elbows bent. Repeat __5-10__ times per set. Do __1-2__ sets per session. Do __2-3 __ sessions per day.    Trigger Point Dry Needling  . What is Trigger Point Dry Needling (DN)? o DN is a physical therapy technique used to treat muscle pain and dysfunction. Specifically, DN helps deactivate muscle trigger points (muscle knots).  o A thin filiform needle is used to penetrate the skin and stimulate the underlying trigger point. The goal is for a local twitch response (LTR) to occur and for the trigger point to relax. No medication of any kind is injected during the procedure.   . What Does Trigger Point Dry Needling Feel Like?  o The procedure feels different for each individual patient. Some patients report that they do not actually feel the needle enter the skin and overall the process is not painful. Very mild bleeding may occur. However, many patients feel a deep cramping in the muscle in which the needle was inserted. This is the local twitch response.   Marland Kitchen How Will I feel after the treatment? o Soreness is normal, and the onset of soreness may not occur for a few hours. Typically this soreness does not last longer than two days.  o Bruising is uncommon, however; ice can be used to decrease any possible bruising.  o In rare cases feeling tired or nauseous after the treatment is normal. In addition, your symptoms may get worse before they get better, this period will typically not last  longer than 24 hours.   . What Can I do After My Treatment? o Increase your hydration by drinking more water for the next 24 hours. o You may place ice or heat on the areas treated that have become sore, however, do not use heat on inflamed or bruised areas. Heat often brings more relief post needling. o You can continue your regular activities, but vigorous activity is not recommended initially after the treatment for 24 hours. o DN is best combined with other physical therapy such as strengthening, stretching, and other therapies.

## 2016-08-04 NOTE — Therapy (Signed)
[facility] [place] [address] [place], [place], [address] Phone: [phone]   Fax:  [phone]  Physical Therapy Treatment  Patient Details  Name: Joann Maddox MRN: [id number] Date of Birth: 14-Apr-1941 Referring Provider: Dr Benjamin Stain  Encounter Date: 08/04/2016      PT End of Session - 08/04/16 1146    Visit Number 2   Number of Visits 12   Date for PT Re-Evaluation 09/09/16   PT Start Time 1147   PT Stop Time 1241   PT Time Calculation (min) 54 min   Activity Tolerance Patient tolerated treatment well      Past Medical History:  Diagnosis Date  . Anxiety   . Headache(784.0)    migrains  . Heart murmur   . Hypothyroidism   . Rheumatoid arthritis(714.0)     Past Surgical History:  Procedure Laterality Date  . ABDOMINAL HYSTERECTOMY    . APPENDECTOMY    . CHOLECYSTECTOMY    . REPLACEMENT TOTAL KNEE BILATERAL    . TONSILLECTOMY     as  a child  . TOTAL HIP ARTHROPLASTY     right and left    There were no vitals filed for this visit.      Subjective Assessment - 08/04/16 1147    Subjective Meli reports that she has a noodle and a pully for home. She is trying to modify her recliner to improve posture. In a lot more pain yesterday especially in the neck - not sure why. Used ice with some relief.  Feels " a little looser" following DN   Currently in Pain? Yes   Pain Score 6    Pain Location Shoulder   Pain Orientation Left   Pain Descriptors / Indicators Tightness;Sore;Sharp   Pain Type Chronic pain   Pain Onset More than a month ago   Pain Frequency Constant   Pain Score 9   Pain Location Neck   Pain Orientation Left   Pain Descriptors / Indicators Sharp;Throbbing   Pain Type Chronic pain   Pain Onset More than a month ago   Pain Frequency Constant            [facility] PT Assessment - 08/04/16 0001      Assessment   Medical Diagnosis Lt adhesive capsulitis; cervical dysfunction    Referring Provider Dr Benjamin Stain   Onset Date/Surgical Date 03/11/16   Hand Dominance Right   Next MD Visit 08/17/16   Prior Therapy none for neck and shoulder      AROM   Cervical Flexion 48   Cervical Extension 15  pain   Cervical - Right Side Bend 25  pain Lt cervical area    Cervical - Left Side Bend 24  pain Lt cervical area      Palpation   Palpation comment significant muscular tightness Lt > Rt upper quarter including upper traps; leveator; pecs; ant/lat/posterior cervical musculature; anterior deltoid; biceps                      [facility] Adult PT Treatment/Exercise - 08/04/16 0001      Shoulder Exercises: Seated   ABduction Limitations W's - active movement to facilatate posterior shoudler girdle contraction    Other Seated Exercises axial extension 10 sec x 5 head supported on pillow at wall    Other Seated Exercises scap squeeze 10 sec x 10 with small swim noodle; head supported on pillow at wall      Shoulder Exercises:  Pulleys   Flexion --  10 sec x 5   ABduction --  10 sec x 5      Moist Heat Therapy   Number Minutes Moist Heat 15 Minutes   Moist Heat Location Cervical;Shoulder  Lt      Electrical Stimulation   Electrical Stimulation Location Lt lateral cervical; Lt shoulder    Electrical Stimulation Action IFC   Electrical Stimulation Parameters to tolerance   Electrical Stimulation Goals Pain;Tone  muscular tightness      Manual Therapy   Manual therapy comments pt sitting   Soft tissue mobilization Lt lateral cervical into the Lt upper trap area     Neck Exercises: Stretches   Other Neck Stretches axial extension 10 sec x 5    Other Neck Stretches lateral cervical flexion 5 sec x 5 each direction           Trigger Point Dry Needling - 08/04/16 1302    Muscles Treated Upper Body --  Lt side - pt in sitting Lt UE supported on pillow               PT Education - 08/04/16 1206    Education provided Yes   Education Details  HEP DN    Person(s) Educated Patient;Spouse   Methods Explanation;Demonstration;Tactile cues;Verbal cues;Handout   Comprehension Verbalized understanding;Returned demonstration;Verbal cues required;Tactile cues required             PT Long Term Goals - 08/04/16 1147      PT LONG TERM GOAL #1   Title Increase AROM cervical spine by 5-10 degrees in all planes 09/09/16   Time 6   Period Weeks   Status On-going     PT LONG TERM GOAL #2   Title Increase AROM Lt shoudler by 5-15 degrees in all planes 09/09/16   Time 6   Period Weeks   Status On-going     PT LONG TERM GOAL #3   Title Increase strength Lt/Rt shoulders allowing patient to use UE's for more functional activities 09/09/16    Time 6   Period Weeks   Status On-going     PT LONG TERM GOAL #4   Title Decrease frequency, intensity, and/or duration or pain by 25-30% allowing patient to move with greater ease and participate in daily activities 09/09/16   Time 6   Period Weeks   Status On-going     PT LONG TERM GOAL #5   Title Independent in HEP 09/09/16   Time 6   Period Weeks   Status On-going               Plan - 08/04/16 1257    Clinical Impression Statement Patient reports increased pain yesterday. She has started making modifications in sitting and has a puley set up for home. She tolerated DN today with minimal c/o with notable decrease in palpable tightness through the Lt lateral cervical and upper trap musculature. Added gentle exercises without difficulty. Good response to DN, manual work and modalities. Treated in sitting due to difficulty lying down.    Rehab Potential Fair   PT Frequency 2x / week   PT Duration 6 weeks   PT Treatment/Interventions Patient/family education;ADLs/Self Care Home Management;Cryotherapy;Electrical Stimulation;Iontophoresis 4mg /ml Dexamethasone;Moist Heat;Ultrasound;Dry needling;Manual techniques;Therapeutic activities;Therapeutic exercise   PT Next Visit Plan continue work  on posture and alignment; education re positioning and movement; postural correction exercises; ROM/strengthening as tolerated; manual work cervical and Lt shoudler girdle; trial of DN; modalities as indicated assess response  to DN - continue as indicated    Consulted and Agree with Plan of Care Patient      Patient will benefit from skilled therapeutic intervention in order to improve the following deficits and impairments:  Postural dysfunction, Improper body mechanics, Pain, Increased fascial restricitons, Increased muscle spasms, Decreased range of motion, Decreased mobility, Decreased strength, Impaired UE functional use, Decreased activity tolerance  Visit Diagnosis: Cervicalgia  Chronic left shoulder pain  Other symptoms and signs involving the musculoskeletal system  Weakness generalized     Problem List Patient Active Problem List   Diagnosis Date Noted  . Left cervical radiculopathy 07/20/2016  . 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HCC) 02/12/2015  . Type 2 diabetes mellitus without complication (HCC) 08/10/2014  . Chronic constipation 02/19/2014  . Rosacea 11/06/2013  . GAD (generalized anxiety disorder) 06/08/2013  . Essential hypertension, benign 05/04/2013  . Rheumatoid arthritis (HCC) 05/04/2013  . Migraine with aura 05/04/2013  . Palpitations 05/04/2013  . Murmur, heart 05/04/2013  . Hyperlipidemia 05/04/2013  . Hypothyroidism 05/04/2013    Tyrone Balash Rober Minion PT, MPH  08/04/2016, 1:04 PM  [facility] [address] [place], [place], [address] Phone: [phone]   Fax:  [phone]  Name: CORRENA MEACHAM MRN: [id number] Date of Birth: 03-04-1941

## 2016-08-07 ENCOUNTER — Encounter: Payer: Self-pay | Admitting: Physical Therapy

## 2016-08-07 ENCOUNTER — Ambulatory Visit (INDEPENDENT_AMBULATORY_CARE_PROVIDER_SITE_OTHER): Payer: Medicare Other | Admitting: Physical Therapy

## 2016-08-07 DIAGNOSIS — M542 Cervicalgia: Secondary | ICD-10-CM | POA: Diagnosis not present

## 2016-08-07 DIAGNOSIS — G8929 Other chronic pain: Secondary | ICD-10-CM

## 2016-08-07 DIAGNOSIS — R531 Weakness: Secondary | ICD-10-CM

## 2016-08-07 DIAGNOSIS — M25512 Pain in left shoulder: Secondary | ICD-10-CM

## 2016-08-07 DIAGNOSIS — R29898 Other symptoms and signs involving the musculoskeletal system: Secondary | ICD-10-CM | POA: Diagnosis not present

## 2016-08-07 NOTE — Therapy (Signed)
[place], [place], [address] Phone: [phone]   Fax:  [phone]  Physical Therapy Treatment  Patient Details  Name: MCKENZI BUONOMO MRN: [id number] Date of Birth: 13-Dec-1940 Referring Provider: Dr Dianah Field  Encounter Date: 08/07/2016      PT End of Session - 08/07/16 1336    Visit Number 3   Number of Visits 12   Date for PT Re-Evaluation 09/09/16   PT Start Time 1336   PT Stop Time 1431   PT Time Calculation (min) 55 min   Activity Tolerance Patient tolerated treatment well      Past Medical History:  Diagnosis Date  . Anxiety   . Headache(784.0)    migrains  . Heart murmur   . Hypothyroidism   . Rheumatoid arthritis(714.0)     Past Surgical History:  Procedure Laterality Date  . ABDOMINAL HYSTERECTOMY    . APPENDECTOMY    . CHOLECYSTECTOMY    . REPLACEMENT TOTAL KNEE BILATERAL    . TONSILLECTOMY     as  a child  . TOTAL HIP ARTHROPLASTY     right and left    There were no vitals filed for this visit.      Subjective Assessment - 08/07/16 1340    Subjective Reata thinks the DN helped some, she had to use some heat to help settle it back down.  She wants to wait until next week to have the needling again.    Patient Stated Goals eliminate the pain in the neck and loosen the shoulder up    Currently in Pain? Yes   Pain Score 5    Pain Location Neck   Pain Orientation Left   Pain Descriptors / Indicators Aching;Dull   Pain Type Chronic pain   Pain Radiating Towards into Lt shoulder   Pain Onset More than a month ago   Pain Frequency Constant   Aggravating Factors  reaching   Pain Relieving Factors tens, ice/heat                         [facility] Adult PT Treatment/Exercise - 08/07/16 0001      Exercises   Exercises Shoulder;Elbow     Shoulder Exercises: Supine   Other Supine Exercises semi reclined on mat with small pool noodle - scap retraction x  20reps, overhead pull with yellow band, only completed a couple due to pain and catching in Lt shoulder      Shoulder Exercises: Standing   Flexion AAROM;Left;10 reps  using shoulder ladder, up to 19   Other Standing Exercises shoulder shrugs x 20 leaning on noodle     Shoulder Exercises: Pulleys   Flexion 2 minutes   ABduction 2 minutes     Shoulder Exercises: Isometric Strengthening   Extension --  10x5 sec in door    External Rotation --  10x5sec in door   Internal Rotation --  10x 5sec on door     Moist Heat Therapy   Number Minutes Moist Heat 15 Minutes   Moist Heat Location Cervical;Shoulder     Electrical Stimulation   Electrical Stimulation Location Lt lateral cervical; Lt shoulder    Electrical Stimulation Action IFC   Electrical Stimulation Parameters  to tolerance   Electrical Stimulation Goals Pain     Neck Exercises: Stretches   Upper Trapezius Stretch 1 rep;30 seconds  each side   Other Neck Stretches doorway stretch low  PT Long Term Goals - 08/04/16 1147      PT LONG TERM GOAL #1   Title Increase AROM cervical spine by 5-10 degrees in all planes 09/09/16   Time 6   Period Weeks   Status On-going     PT LONG TERM GOAL #2   Title Increase AROM Lt shoudler by 5-15 degrees in all planes 09/09/16   Time 6   Period Weeks   Status On-going     PT LONG TERM GOAL #3   Title Increase strength Lt/Rt shoulders allowing patient to use UE's for more functional activities 09/09/16    Time 6   Period Weeks   Status On-going     PT LONG TERM GOAL #4   Title Decrease frequency, intensity, and/or duration or pain by 25-30% allowing patient to move with greater ease and participate in daily activities 09/09/16   Time 6   Period Weeks   Status On-going     PT LONG TERM GOAL #5   Title Independent in HEP 09/09/16   Time 6   Period Weeks   Status On-going               Plan - 08/07/16 1423    Clinical Impression  Statement Indira wanted to wait until next week to have DN again, she has very kyphotic posturing and requires VC to be upright. This is is her third visit, no goals met,    Rehab Potential Fair   PT Frequency 2x / week   PT Duration 6 weeks   PT Treatment/Interventions Patient/family education;ADLs/Self Care Home Management;Cryotherapy;Electrical Stimulation;Iontophoresis 30m/ml Dexamethasone;Moist Heat;Ultrasound;Dry needling;Manual techniques;Therapeutic activities;Therapeutic exercise   PT Next Visit Plan DN to Lt shoulder and cervical musculature.    Consulted and Agree with Plan of Care Patient      Patient will benefit from skilled therapeutic intervention in order to improve the following deficits and impairments:  Postural dysfunction, Improper body mechanics, Pain, Increased fascial restricitons, Increased muscle spasms, Decreased range of motion, Decreased mobility, Decreased strength, Impaired UE functional use, Decreased activity tolerance  Visit Diagnosis: Cervicalgia  Chronic left shoulder pain  Other symptoms and signs involving the musculoskeletal system  Weakness generalized     Problem List Patient Active Problem List   Diagnosis Date Noted  . Left cervical radiculopathy 07/20/2016  . 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[place] 02/12/2015  . Type 2 diabetes mellitus without complication ([place] 061/16/4353 . Chronic constipation 02/19/2014  . Rosacea 11/06/2013  . GAD (generalized anxiety disorder) 06/08/2013  . Essential hypertension, benign 05/04/2013  . Rheumatoid arthritis ([facility] 05/04/2013  . Migraine with aura 05/04/2013  . Palpitations 05/04/2013  . Murmur, heart 05/04/2013  . Hyperlipidemia 05/04/2013  . Hypothyroidism 05/04/2013    SJeral PinchPT 08/07/2016, 2:26 PM  [facility] [place] [address]Phone:  [phone]  Fax:  [phone] Name: RMINAHIL QUINLIVANMRN: [id number] of Birth: 3March 05, 1942

## 2016-08-10 ENCOUNTER — Ambulatory Visit (INDEPENDENT_AMBULATORY_CARE_PROVIDER_SITE_OTHER): Payer: Medicare Other | Admitting: Rehabilitative and Restorative Service Providers"

## 2016-08-10 ENCOUNTER — Encounter: Payer: Self-pay | Admitting: Rehabilitative and Restorative Service Providers"

## 2016-08-10 DIAGNOSIS — M25512 Pain in left shoulder: Secondary | ICD-10-CM

## 2016-08-10 DIAGNOSIS — R531 Weakness: Secondary | ICD-10-CM

## 2016-08-10 DIAGNOSIS — R29898 Other symptoms and signs involving the musculoskeletal system: Secondary | ICD-10-CM

## 2016-08-10 DIAGNOSIS — G8929 Other chronic pain: Secondary | ICD-10-CM | POA: Diagnosis not present

## 2016-08-10 DIAGNOSIS — M542 Cervicalgia: Secondary | ICD-10-CM | POA: Diagnosis not present

## 2016-08-10 NOTE — Therapy (Signed)
[facility] [place] [address] [place], [place], [address] Phone: [phone]   Fax:  [phone]  Physical Therapy Treatment  Patient Details  Name: Joann Maddox MRN: [id number] Date of Birth: 1941/02/15 Referring Provider: Dr Mardene Speak   Encounter Date: 08/10/2016      PT End of Session - 08/10/16 1154    Visit Number 4   Number of Visits 12   Date for PT Re-Evaluation 09/09/16   PT Start Time 1154   PT Stop Time 1250   PT Time Calculation (min) 56 min   Activity Tolerance Patient tolerated treatment well      Past Medical History:  Diagnosis Date  . Anxiety   . Headache(784.0)    migrains  . Heart murmur   . Hypothyroidism   . Rheumatoid arthritis(714.0)     Past Surgical History:  Procedure Laterality Date  . ABDOMINAL HYSTERECTOMY    . APPENDECTOMY    . CHOLECYSTECTOMY    . REPLACEMENT TOTAL KNEE BILATERAL    . TONSILLECTOMY     as  a child  . TOTAL HIP ARTHROPLASTY     right and left    There were no vitals filed for this visit.      Subjective Assessment - 08/10/16 1154    Subjective Joann Maddox reports that the DN is helpful. She had increased pain in the neck yesterday - maybe due to the change in weather. All her joints were hurting were hurting - even her feet, but the neck was the worst.    Currently in Pain? Yes   Pain Score 6    Pain Location Neck   Pain Orientation Left   Pain Descriptors / Indicators Aching;Dull   Pain Type Chronic pain   Pain Radiating Towards into Lt shoulder    Pain Onset More than a month ago   Pain Frequency Constant            [facility] PT Assessment - 08/10/16 0001      Assessment   Medical Diagnosis Lt adhesive capsulitis; cervical dysfunction    Referring Provider Dr Mardene Speak    Onset Date/Surgical Date 03/11/16   Hand Dominance Right   Next MD Visit 08/17/16   Prior Therapy none for neck and shoulder      Strength   Overall Strength --  improved  scapular retraction w/ exercise      Palpation   Palpation comment continued muscular tightness Lt > Rt upper quarter including upper traps; leveator; pecs; ant/lat/posterior cervical musculature; anterior deltoid; biceps                      [facility] Adult PT Treatment/Exercise - 08/10/16 0001      Neuro Re-ed    Neuro Re-ed Details  working on posture and alignment - attempting to facilitate posterior shoudler girdle musculature and extend trunk/hips/knees      Shoulder Exercises: Seated   ABduction Limitations W's - active movement to facilatate posterior shoudler girdle contraction    Other Seated Exercises axial extension 10 sec x 5    Other Seated Exercises scap squeeze 10 sec x 10 noodle     Shoulder Exercises: Standing   Other Standing Exercises scap squeeze w/ noodle 10 sec x 10      Shoulder Exercises: Pulleys   Flexion 2 minutes  pause for stretch at max height ~5-10 sec    ABduction 2 minutes  pause for stretch at max height ~5-10 sec  Shoulder Exercises: Therapy Ball   Other Therapy Ball Exercises scapular depressing and retraction in sitting pushing into large red ball x 20; rhythmic stabilization on red ball with PT moving the ball in various directions 1 min x 2    Other Therapy Ball Exercises --     Moist Heat Therapy   Number Minutes Moist Heat 15 Minutes   Moist Heat Location Cervical;Shoulder     Electrical Stimulation   Electrical Stimulation Location Lt lateral cervical; Lt shoulder    Electrical Stimulation Action IFC   Electrical Stimulation Parameters to tolerance   Electrical Stimulation Goals Pain;Tone  muscular tightness      Manual Therapy   Manual therapy comments pt sitting   Soft tissue mobilization Lt lateral cervical into the Lt upper trap area          Trigger Point Dry Needling - 08/10/16 1248    Consent Given? Yes   Muscles Treated Upper Body --  Lt - patient in sitting UE supported on table with pillow    Upper  Trapezius Response Palpable increased muscle length   Oblique Capitus Response Palpable increased muscle length   Longissimus Response Palpable increased muscle length                   PT Long Term Goals - 08/10/16 1153      PT LONG TERM GOAL #1   Title Increase AROM cervical spine by 5-10 degrees in all planes 09/09/16   Time 6   Period Weeks   Status On-going     PT LONG TERM GOAL #2   Title Increase AROM Lt shoudler by 5-15 degrees in all planes 09/09/16   Time 6   Period Weeks   Status On-going     PT LONG TERM GOAL #3   Title Increase strength Lt/Rt shoulders allowing patient to use UE's for more functional activities 09/09/16    Time 6   Period Weeks   Status On-going     PT LONG TERM GOAL #4   Title Decrease frequency, intensity, and/or duration or pain by 25-30% allowing patient to move with greater ease and participate in daily activities 09/09/16   Time 6   Period Weeks   Status On-going     PT LONG TERM GOAL #5   Title Independent in HEP 09/09/16   Time 6   Period Weeks   Status On-going     PT LONG TERM GOAL #6   Title Improve FOTO to </= 41% limitation 09/09/16   Time 6   Period Weeks   Status On-going               Plan - 08/10/16 1256    Clinical Impression Statement Patient reports good symptoms relief with DN. She has made some modifications to chair that she sits in at home and will try to have husband turn chair so she s looking directly at the TV instead of looking to the Lt slightly. Patient tolerated exercise well. continues to demonstrate forward posture with hips and trunk. Good response to DN, manual work and modalities.    Rehab Potential Fair   PT Frequency 2x / week   PT Duration 6 weeks   PT Treatment/Interventions Patient/family education;ADLs/Self Care Home Management;Cryotherapy;Electrical Stimulation;Iontophoresis 4mg /ml Dexamethasone;Moist Heat;Ultrasound;Dry needling;Manual techniques;Therapeutic activities;Therapeutic  exercise   PT Next Visit Plan DN to Lt shoulder and cervical musculature; manual work; modalities; postural correction - general strengthening of postural musculature    Consulted and Agree  with Plan of Care Patient      Patient will benefit from skilled therapeutic intervention in order to improve the following deficits and impairments:  Postural dysfunction, Improper body mechanics, Pain, Increased fascial restricitons, Increased muscle spasms, Decreased range of motion, Decreased mobility, Decreased strength, Impaired UE functional use, Decreased activity tolerance  Visit Diagnosis: Cervicalgia  Chronic left shoulder pain  Other symptoms and signs involving the musculoskeletal system  Weakness generalized     Problem List Patient Active Problem List   Diagnosis Date Noted  . Left cervical radiculopathy 07/20/2016  . 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HCC) 02/12/2015  . Type 2 diabetes mellitus without complication (HCC) 08/10/2014  . Chronic constipation 02/19/2014  . Rosacea 11/06/2013  . GAD (generalized anxiety disorder) 06/08/2013  . Essential hypertension, benign 05/04/2013  . Rheumatoid arthritis (HCC) 05/04/2013  . Migraine with aura 05/04/2013  . Palpitations 05/04/2013  . Murmur, heart 05/04/2013  . Hyperlipidemia 05/04/2013  . Hypothyroidism 05/04/2013    Joann Maddox PT, MPH  08/10/2016, 1:02 PM  [facility] [address] [place], [place], [address] Phone: [phone]   Fax:  [phone]  Name: Joann Maddox MRN: [id number] Date of Birth: [birth year]

## 2016-08-13 ENCOUNTER — Encounter: Payer: Medicare Other | Admitting: Rehabilitative and Restorative Service Providers"

## 2016-08-14 ENCOUNTER — Encounter: Payer: Self-pay | Admitting: Rehabilitative and Restorative Service Providers"

## 2016-08-14 ENCOUNTER — Ambulatory Visit (INDEPENDENT_AMBULATORY_CARE_PROVIDER_SITE_OTHER): Payer: Medicare Other | Admitting: Rehabilitative and Restorative Service Providers"

## 2016-08-14 DIAGNOSIS — R531 Weakness: Secondary | ICD-10-CM | POA: Diagnosis not present

## 2016-08-14 DIAGNOSIS — M542 Cervicalgia: Secondary | ICD-10-CM | POA: Diagnosis not present

## 2016-08-14 DIAGNOSIS — R29898 Other symptoms and signs involving the musculoskeletal system: Secondary | ICD-10-CM

## 2016-08-14 DIAGNOSIS — G8929 Other chronic pain: Secondary | ICD-10-CM

## 2016-08-14 DIAGNOSIS — M25512 Pain in left shoulder: Secondary | ICD-10-CM

## 2016-08-14 NOTE — Therapy (Signed)
[facility] [place] [address] [place], [place], [address] Phone: [phone]   Fax:  [phone]  Physical Therapy Treatment  Patient Details  Name: Joann Maddox MRN: [id number] Date of Birth: Aug 09, 1940 Referring Provider: Dr Mardene Speak   Encounter Date: 08/14/2016      PT End of Session - 08/14/16 1612    Visit Number 5   Number of Visits 12   Date for PT Re-Evaluation 09/09/16   PT Start Time 1530   PT Stop Time 1623   PT Time Calculation (min) 53 min   Equipment Utilized During Treatment Gait belt   Activity Tolerance Patient tolerated treatment well      Past Medical History:  Diagnosis Date  . Anxiety   . Headache(784.0)    migrains  . Heart murmur   . Hypothyroidism   . Rheumatoid arthritis(714.0)     Past Surgical History:  Procedure Laterality Date  . ABDOMINAL HYSTERECTOMY    . APPENDECTOMY    . CHOLECYSTECTOMY    . REPLACEMENT TOTAL KNEE BILATERAL    . TONSILLECTOMY     as  a child  . TOTAL HIP ARTHROPLASTY     right and left    There were no vitals filed for this visit.      Subjective Assessment - 08/14/16 1613    Subjective Joann Maddox reports that the DN helped the rest of the day she was here but then she had incresed pain the following day. The pain may be due to the change in weather - cold and raining. She still feels the needling helps and would like to try it again today. She will be out of town next week and unable to come in for therapy. Will work on her exercises and posture. She did have her husband move her chair so she directly faces the TV and she has modified the recliner with pillow and swim noodle and feels that helps her with sitting posture and comfort.    Currently in Pain? Yes   Pain Score 5    Pain Location Neck   Pain Orientation Left   Pain Descriptors / Indicators Aching;Dull   Pain Type Chronic pain   Pain Onset More than a month ago   Pain Frequency Constant   Pain  Score 6   Pain Location Shoulder   Pain Orientation Left   Pain Descriptors / Indicators Aching;Tightness   Pain Type Chronic pain   Pain Frequency Constant                         [facility] Adult PT Treatment/Exercise - 08/14/16 0001      Shoulder Exercises: Seated   Retraction Strengthening;Both;20 reps;Theraband  hold with Lt UE moving Rt to avoid Lt shoulder crepitus/pain   Theraband Level (Shoulder Retraction) Level 1 (Yellow)   Row Strengthening;Both;20 reps;Theraband   Theraband Level (Shoulder Row) Level 1 (Yellow)   ABduction Limitations W's - active movement to facilatate posterior shoudler girdle contraction    Other Seated Exercises axial extension 10 sec x 5    Other Seated Exercises scap squeeze 10 sec x 10 noodle     Shoulder Exercises: Standing   Other Standing Exercises scap squeeze w/ noodle 10 sec x 10      Shoulder Exercises: Pulleys   Flexion 2 minutes  pause for stretch at max height ~5-10 sec    ABduction 2 minutes  pause for stretch at max height ~5-10 sec  Moist Heat Therapy   Number Minutes Moist Heat 15 Minutes   Moist Heat Location Cervical;Shoulder     Electrical Stimulation   Electrical Stimulation Location Lt lateral cervical; Lt shoulder    Electrical Stimulation Action IFC   Electrical Stimulation Parameters to tolerance   Electrical Stimulation Goals Pain;Tone  muscular tightness      Manual Therapy   Manual therapy comments pt sitting   Soft tissue mobilization Lt lateral cervical into the Lt upper trap area     Neck Exercises: Stretches   Upper Trapezius Stretch 3 reps;10 seconds          Trigger Point Dry Needling - 08/14/16 1620    Consent Given? Yes   Upper Trapezius Response Palpable increased muscle length   Oblique Capitus Response Palpable increased muscle length   Longissimus Response Palpable increased muscle length              PT Education - 08/14/16 1619    Education provided Yes    Education Details HEP    Person(s) Educated Patient   Methods Explanation;Demonstration;Tactile cues;Verbal cues;Handout   Comprehension Verbalized understanding;Returned demonstration;Verbal cues required;Tactile cues required             PT Long Term Goals - 08/10/16 1153      PT LONG TERM GOAL #1   Title Increase AROM cervical spine by 5-10 degrees in all planes 09/09/16   Time 6   Period Weeks   Status On-going     PT LONG TERM GOAL #2   Title Increase AROM Lt shoudler by 5-15 degrees in all planes 09/09/16   Time 6   Period Weeks   Status On-going     PT LONG TERM GOAL #3   Title Increase strength Lt/Rt shoulders allowing patient to use UE's for more functional activities 09/09/16    Time 6   Period Weeks   Status On-going     PT LONG TERM GOAL #4   Title Decrease frequency, intensity, and/or duration or pain by 25-30% allowing patient to move with greater ease and participate in daily activities 09/09/16   Time 6   Period Weeks   Status On-going     PT LONG TERM GOAL #5   Title Independent in HEP 09/09/16   Time 6   Period Weeks   Status On-going     PT LONG TERM GOAL #6   Title Improve FOTO to </= 41% limitation 09/09/16   Time 6   Period Weeks   Status On-going             Patient will benefit from skilled therapeutic intervention in order to improve the following deficits and impairments:     Visit Diagnosis: Cervicalgia  Chronic left shoulder pain  Other symptoms and signs involving the musculoskeletal system  Weakness generalized     Problem List Patient Active Problem List   Diagnosis Date Noted  . Left cervical radiculopathy 07/20/2016  . 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HCC) 02/12/2015  . Type 2 diabetes mellitus without complication (HCC) 08/10/2014  . Chronic constipation 02/19/2014  . Rosacea 11/06/2013  . GAD (generalized anxiety disorder) 06/08/2013  .  Essential hypertension, benign 05/04/2013  . Rheumatoid arthritis (HCC) 05/04/2013  . Migraine with aura 05/04/2013  . Palpitations 05/04/2013  . Murmur, heart 05/04/2013  . Hyperlipidemia 05/04/2013  . Hypothyroidism 05/04/2013    Delories Mauri Rober Minion PT, MPH  08/14/2016, 4:21 PM  [facility] [address] [place], [place], [address] Phone: [phone]   Fax:  [phone]  Name: Joann Maddox MRN: [id number] Date of Birth: 18-Aug-1940

## 2016-08-14 NOTE — Patient Instructions (Signed)
Sitting   Resisted External Rotation: in Neutral - Bilateral   PALMS UP Sit or stand, tubing in both hands, elbows at sides, bent to 90, forearms forward. Pinch shoulder blades together and rotate forearms out. Keep elbows at sides. Repeat __10__ times per set. Do _2-3___ sets per session. Do _2-3___ sessions per day.   Low Row: Standing   Face anchor, feet shoulder width apart. Palms up, pull arms back, squeezing shoulder blades together. Repeat 10__ times per set. Do 2-3__ sets per session. Do 2-3__ sessions per week. Anchor Height: Waist   '

## 2016-08-17 ENCOUNTER — Ambulatory Visit: Payer: Medicare Other | Admitting: Sports Medicine

## 2016-08-25 ENCOUNTER — Ambulatory Visit (INDEPENDENT_AMBULATORY_CARE_PROVIDER_SITE_OTHER): Payer: Medicare Other | Admitting: Sports Medicine

## 2016-08-25 ENCOUNTER — Encounter: Payer: Self-pay | Admitting: Rehabilitative and Restorative Service Providers"

## 2016-08-25 ENCOUNTER — Ambulatory Visit (INDEPENDENT_AMBULATORY_CARE_PROVIDER_SITE_OTHER): Payer: Medicare Other | Admitting: Rehabilitative and Restorative Service Providers"

## 2016-08-25 DIAGNOSIS — R29898 Other symptoms and signs involving the musculoskeletal system: Secondary | ICD-10-CM

## 2016-08-25 DIAGNOSIS — M7502 Adhesive capsulitis of left shoulder: Secondary | ICD-10-CM

## 2016-08-25 DIAGNOSIS — M5412 Radiculopathy, cervical region: Secondary | ICD-10-CM | POA: Diagnosis not present

## 2016-08-25 DIAGNOSIS — G8929 Other chronic pain: Secondary | ICD-10-CM | POA: Diagnosis not present

## 2016-08-25 DIAGNOSIS — M542 Cervicalgia: Secondary | ICD-10-CM

## 2016-08-25 DIAGNOSIS — M25512 Pain in left shoulder: Secondary | ICD-10-CM

## 2016-08-25 DIAGNOSIS — R531 Weakness: Secondary | ICD-10-CM | POA: Diagnosis not present

## 2016-08-25 NOTE — Progress Notes (Signed)
  Subjective:    CC: Follow-up  HPI: Left shoulder adhesive capsulitis: Improved considerably with glenohumeral injection at the last visit and physical therapy, essentially resolved.  Left cervical radiculitis: With moderate to severe neck pain, radiating occasionally down the arm and around the scapula. Physical therapy has not really been that effective. She is having some weakness and instability with walking as well.  Past medical history:  Negative.  See flowsheet/record as well for more information.  Surgical history: Negative.  See flowsheet/record as well for more information.  Family history: Negative.  See flowsheet/record as well for more information.  Social history: Negative.  See flowsheet/record as well for more information.  Allergies, and medications have been entered into the medical record, reviewed, and no changes needed.   Review of Systems: No fevers, chills, night sweats, weight loss, chest pain, or shortness of breath.   Objective:    General: Well Developed, well nourished, and in no acute distress.  Neuro: Alert and oriented x3, extra-ocular muscles intact, sensation grossly intact.  HEENT: Normocephalic, atraumatic, pupils equal round reactive to light, neck supple, no masses, no lymphadenopathy, thyroid nonpalpable.  Skin: Warm and dry, no rashes. Cardiac: Regular rate and rhythm, no murmurs rubs or gallops, no lower extremity edema.  Respiratory: Clear to auscultation bilaterally. Not using accessory muscles, speaking in full sentences.  Impression and Recommendations:    Left cervical radiculopathy With multilevel central canal stenosis, mostly leftward component. I do think that her central canal stenosis with probably mild spinal cord compression is relating to some of the imbalance and lower extremity weakness though she likely does have protruding discs in the lumbar spine as well. We will place her lumbar radicular pain on the back burner for  now. We are going to proceed with a left C6-C7 interlaminar epidural with a 1 month follow-up.  Adhesive capsulitis of left shoulder Improved considerably with glenohumeral injection at the last visit, not completely better yet still working with physical therapy.  I spent 25 minutes with this patient, greater than 50% was face-to-face time counseling regarding the above diagnoses

## 2016-08-25 NOTE — Therapy (Signed)
[facility] [place] [address] [place], [place], [address] Phone: [phone]   Fax:  [phone]  Physical Therapy Treatment  Patient Details  Name: Joann Maddox MRN: [id number] Date of Birth: Mar 13, 1941 Referring Provider: Dr Benjamin Stain  Encounter Date: 08/25/2016      PT End of Session - 08/25/16 1020    Visit Number 6   Number of Visits 12   Date for PT Re-Evaluation 09/09/16   PT Start Time 1016   PT Stop Time 1112   PT Time Calculation (min) 56 min   Activity Tolerance Patient tolerated treatment well      Past Medical History:  Diagnosis Date  . Anxiety   . Headache(784.0)    migrains  . Heart murmur   . Hypothyroidism   . Rheumatoid arthritis(714.0)     Past Surgical History:  Procedure Laterality Date  . ABDOMINAL HYSTERECTOMY    . APPENDECTOMY    . CHOLECYSTECTOMY    . REPLACEMENT TOTAL KNEE BILATERAL    . TONSILLECTOMY     as  a child  . TOTAL HIP ARTHROPLASTY     right and left    There were no vitals filed for this visit.      Subjective Assessment - 08/25/16 1024    Subjective Joann Maddox reports that she enjoyed her trip last week but didn't have a chance to work on her exercises. She is having increased pain in the neck. Shoulder feels some better. Having pain in both thumbs - due to arthritis - today.    Pertinent History multiple joint arthritis; bilat hip replacements and bilat knee replacements in the past 12 years - most recent joint replacement ~ 4-5 years ago; LBP; DDD cervical spine    How long can you sit comfortably? recliner 1-2 hours with pillow at neck; straight chair ~45 -60 min    How long can you stand comfortably? 3-5 min    How long can you walk comfortably? not at all    Diagnostic tests xrays    Patient Stated Goals eliminate the pain in the neck and loosen the shoulder up    Currently in Pain? Yes   Pain Score 7    Pain Location Neck   Pain Orientation Left   Pain  Descriptors / Indicators Aching;Dull   Pain Type Chronic pain   Pain Onset More than a month ago            [facility] PT Assessment - 08/25/16 0001      Assessment   Medical Diagnosis Lt adhesive capsulitis; cervical dysfunction    Referring Provider Dr Benjamin Stain   Onset Date/Surgical Date 03/11/16   Hand Dominance Right   Prior Therapy none for neck and shoulder      AROM   Left Shoulder Flexion 105 Degrees   Left Shoulder ABduction 93 Degrees   Left Shoulder Internal Rotation 65 Degrees   Left Shoulder External Rotation 64 Degrees   Cervical Flexion 52   Cervical Extension 43   Cervical - Right Side Bend 38   Cervical - Left Side Bend 32   Cervical - Right Rotation 61   Cervical - Left Rotation 35     Strength   Overall Strength --  improved scapular retraction w/ exercise      Palpation   Palpation comment continued muscular tightness Lt > Rt upper quarter including upper traps; leveator; pecs; ant/lat/posterior cervical musculature; anterior deltoid; biceps  [facility] Adult PT Treatment/Exercise - 08/25/16 0001      Shoulder Exercises: Seated   ABduction Limitations W's - active movement to facilatate posterior shoudler girdle contraction    Other Seated Exercises axial extension 10 sec x 5    Other Seated Exercises scap squeeze 10 sec x 10 noodle     Shoulder Exercises: Standing   Other Standing Exercises scap squeeze w/ noodle 10 sec x 10      Shoulder Exercises: Pulleys   Flexion 2 minutes  pause for stretch at max height ~5-10 sec    ABduction 2 minutes  pause for stretch at max height ~5-10 sec      Shoulder Exercises: Therapy Ball   Other Therapy Ball Exercises scapular depressing and retraction in sitting pushing into large red ball x 20; rhythmic stabilization on red ball with PT moving the ball in various directions 1 min x 2      Moist Heat Therapy   Number Minutes Moist Heat 15 Minutes   Moist Heat Location  Cervical;Shoulder     Electrical Stimulation   Electrical Stimulation Location Lt lateral cervical; Lt shoulder    Electrical Stimulation Action IFC   Electrical Stimulation Parameters to tolerance   Electrical Stimulation Goals Pain;Tone  muscular tightness      Manual Therapy   Manual therapy comments pt sitting   Soft tissue mobilization Lt lateral cervical into the Lt upper trap area          Trigger Point Dry Needling - 08/25/16 1056    Consent Given? Yes   Muscles Treated Upper Body --  Lt   Upper Trapezius Response Palpable increased muscle length   Oblique Capitus Response Palpable increased muscle length   Longissimus Response Palpable increased muscle length                   PT Long Term Goals - 08/25/16 1019      PT LONG TERM GOAL #1   Title Increase AROM cervical spine by 5-10 degrees in all planes 09/09/16   Time 6   Period Weeks   Status On-going     PT LONG TERM GOAL #2   Title Increase AROM Lt shoudler by 5-15 degrees in all planes 09/09/16   Time 6   Period Weeks   Status On-going     PT LONG TERM GOAL #3   Title Increase strength Lt/Rt shoulders allowing patient to use UE's for more functional activities 09/09/16    Time 6   Period Weeks   Status On-going     PT LONG TERM GOAL #4   Title Decrease frequency, intensity, and/or duration or pain by 25-30% allowing patient to move with greater ease and participate in daily activities 09/09/16   Time 6   Period Weeks   Status On-going     PT LONG TERM GOAL #5   Title Independent in HEP 09/09/16   Time 6   Period Weeks   Status On-going     PT LONG TERM GOAL #6   Title Improve FOTO to </= 41% limitation 09/09/16   Time 6   Period Weeks   Status On-going               Plan - 08/25/16 1021    Clinical Impression Statement Out of town last week. Did not have time for exercises. Shoulder improved. Continued pain in the Lt neck. Continued palpable tightness noted through the  anterior/lateral/posterior cervical musculature as well as into  the anterior chest and shoulder. Responds well to DN and manual work. Needs continued postural correction. Gradually progressing toward stated goals of therapy and improved functional activity tolerance.    Rehab Potential Fair   Clinical Impairments Affecting Rehab Potential multiple co-morbities    PT Frequency 2x / week   PT Duration 6 weeks   PT Treatment/Interventions Patient/family education;ADLs/Self Care Home Management;Cryotherapy;Electrical Stimulation;Iontophoresis 4mg /ml Dexamethasone;Moist Heat;Ultrasound;Dry needling;Manual techniques;Therapeutic activities;Therapeutic exercise   PT Next Visit Plan DN to Lt shoulder and cervical musculature; manual work; modalities; postural correction - general strengthening of postural musculature    Consulted and Agree with Plan of Care Patient      Patient will benefit from skilled therapeutic intervention in order to improve the following deficits and impairments:  Postural dysfunction, Improper body mechanics, Pain, Increased fascial restricitons, Increased muscle spasms, Decreased range of motion, Decreased mobility, Decreased strength, Impaired UE functional use, Decreased activity tolerance  Visit Diagnosis: Cervicalgia  Chronic left shoulder pain  Other symptoms and signs involving the musculoskeletal system  Weakness generalized     Problem List Patient Active Problem List   Diagnosis Date Noted  . Left cervical radiculopathy 07/20/2016  . 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HCC) 02/12/2015  . Type 2 diabetes mellitus without complication (HCC) 08/10/2014  . Chronic constipation 02/19/2014  . Rosacea 11/06/2013  . GAD (generalized anxiety disorder) 06/08/2013  . Essential hypertension, benign 05/04/2013  . Rheumatoid arthritis (HCC) 05/04/2013  . Migraine with aura 05/04/2013  . Palpitations  05/04/2013  . Murmur, heart 05/04/2013  . Hyperlipidemia 05/04/2013  . Hypothyroidism 05/04/2013    Teri Diltz P Charl Wellen Pt, MPH  08/25/2016, 10:58 AM  [facility] [address] [place], [place], [place] Phone: [phone]   Fax:  [phone]  Name: Joann Maddox MRN: Lowry Ram Date of Birth: 01-Apr-1941

## 2016-08-25 NOTE — Assessment & Plan Note (Signed)
With multilevel central canal stenosis, mostly leftward component. I do think that her central canal stenosis with probably mild spinal cord compression is relating to some of the imbalance and lower extremity weakness though she likely does have protruding discs in the lumbar spine as well. We will place her lumbar radicular pain on the back burner for now. We are going to proceed with a left C6-C7 interlaminar epidural with a 1 month follow-up.

## 2016-08-25 NOTE — Assessment & Plan Note (Signed)
Improved considerably with glenohumeral injection at the last visit, not completely better yet still working with physical therapy.

## 2016-08-28 ENCOUNTER — Encounter: Payer: Self-pay | Admitting: Rehabilitative and Restorative Service Providers"

## 2016-08-28 ENCOUNTER — Ambulatory Visit (INDEPENDENT_AMBULATORY_CARE_PROVIDER_SITE_OTHER): Payer: Medicare Other | Admitting: Rehabilitative and Restorative Service Providers"

## 2016-08-28 DIAGNOSIS — R531 Weakness: Secondary | ICD-10-CM | POA: Diagnosis not present

## 2016-08-28 DIAGNOSIS — G8929 Other chronic pain: Secondary | ICD-10-CM

## 2016-08-28 DIAGNOSIS — M542 Cervicalgia: Secondary | ICD-10-CM | POA: Diagnosis not present

## 2016-08-28 DIAGNOSIS — M25512 Pain in left shoulder: Secondary | ICD-10-CM | POA: Diagnosis not present

## 2016-08-28 DIAGNOSIS — R29898 Other symptoms and signs involving the musculoskeletal system: Secondary | ICD-10-CM

## 2016-08-28 NOTE — Therapy (Addendum)
[facility] [place] [address] [place] [address] [place], [place], [address] Phone: [phone]   Fax:  [phone]  Physical Therapy Treatment  Patient Details  Name: Joann Maddox MRN: [id number] Date of Birth: [birth year] Referring Provider: Dr Benjamin Stain  Encounter Date: 08/28/2016      PT End of Session - 08/28/16 1106    Visit Number 7   Number of Visits 12   Date for PT Re-Evaluation 09/09/16   PT Start Time 1105   PT Stop Time 1202   PT Time Calculation (min) 57 min   Activity Tolerance Patient tolerated treatment well      Past Medical History:  Diagnosis Date  . Anxiety   . Headache(784.0)    migrains  . Heart murmur   . Hypothyroidism   . Rheumatoid arthritis(714.0)     Past Surgical History:  Procedure Laterality Date  . ABDOMINAL HYSTERECTOMY    . APPENDECTOMY    . CHOLECYSTECTOMY    . REPLACEMENT TOTAL KNEE BILATERAL    . TONSILLECTOMY     as  a child  . TOTAL HIP ARTHROPLASTY     right and left    There were no vitals filed for this visit.      Subjective Assessment - 08/28/16 1108    Subjective Joann Maddox reports that her shoulder is feeling better. She is not awake at night due to the shoulder and notes that she can move her shoulder and arm better. She still has to be careful with how she moves her arm. The Lt neck is still hurting. The needling helps but the pain is still there.  Patient will be out of town next week - returns the following week 09/08/16   Currently in Pain? Yes   Pain Score 6    Pain Location Neck   Pain Orientation Left   Pain Type Chronic pain   Pain Onset More than a month ago   Pain Frequency Constant   Aggravating Factors  moving; turning head; reaching; bending over for something    Pain Relieving Factors DN; TENS; ice; heat   Pain Score 5   Pain Location Shoulder   Pain Orientation Left   Pain Descriptors / Indicators Aching;Tightness   Pain Type Chronic pain   Pain Onset More  than a month ago   Aggravating Factors  reaching; bending forward for anything; certain movements   Pain Relieving Factors DN; TENS; ice; heat; meds                         [facility] Adult PT Treatment/Exercise - 08/28/16 0001      Shoulder Exercises: Standing   Row Strengthening;Both;20 reps;Theraband  upright row band secured at floor    Theraband Level (Shoulder Row) Level 1 (Yellow)   Other Standing Exercises scap squeeze w/ noodle 10 sec x 10    Other Standing Exercises scap retraction W's x 10     Shoulder Exercises: Pulleys   Flexion --  10 sec hold x 10   ABduction --  10 sec hold x 10 - scaption      Manual Therapy   Manual therapy comments pt Rt sidelying    Soft tissue mobilization Lt lateral and posterior cervical into the Lt upper trap area     Neck Exercises: Stretches   Upper Trapezius Stretch 3 reps;10 seconds  sitting    Other Neck Stretches axial extension 2 sec x 10; posterior shoulder rolls x 10;  cervical rotation x 5 each direction  - all exercises in sitting           Trigger Point Dry Needling - 08/28/16 1200    Consent Given? Yes   Muscles Treated Upper Body --  Lt side with pt Rt sidelying    Scalenes Response Palpable increased muscle length   Upper Trapezius Response Palpable increased muscle length   Oblique Capitus Response Palpable increased muscle length   Longissimus Response Palpable increased muscle length      Treatment was concluded with e-stim and moist heat to Lt lateral cervical area.        PT Education - 08/28/16 1202    Education provided Yes   Education Details encouraged consistent HEP    Person(s) Educated Patient   Methods Explanation   Comprehension Verbalized understanding             PT Long Term Goals - 08/25/16 1019      PT LONG TERM GOAL #1   Title Increase AROM cervical spine by 5-10 degrees in all planes 09/09/16   Time 6   Period Weeks   Status On-going     PT LONG TERM GOAL #2    Title Increase AROM Lt shoudler by 5-15 degrees in all planes 09/09/16   Time 6   Period Weeks   Status On-going     PT LONG TERM GOAL #3   Title Increase strength Lt/Rt shoulders allowing patient to use UE's for more functional activities 09/09/16    Time 6   Period Weeks   Status On-going     PT LONG TERM GOAL #4   Title Decrease frequency, intensity, and/or duration or pain by 25-30% allowing patient to move with greater ease and participate in daily activities 09/09/16   Time 6   Period Weeks   Status On-going     PT LONG TERM GOAL #5   Title Independent in HEP 09/09/16   Time 6   Period Weeks   Status On-going     PT LONG TERM GOAL #6   Title Improve FOTO to </= 41% limitation 09/09/16   Time 6   Period Weeks   Status On-going               Plan - 08/28/16 1113    Clinical Impression Statement Continued pain in the Lt neck which is constant in nature. Patient responds well to dry needling and does seem to progress with intensity of symptoms. Shoulder is improving with increased ROM and function. Postural correction is difficult due to multiple joint arthritis; poor posture and alignment; chronic pain.    Rehab Potential Fair   PT Frequency 2x / week   PT Duration 6 weeks   PT Treatment/Interventions Patient/family education;ADLs/Self Care Home Management;Cryotherapy;Electrical Stimulation;Iontophoresis 4mg /ml Dexamethasone;Moist Heat;Ultrasound;Dry needling;Manual techniques;Therapeutic activities;Therapeutic exercise   PT Next Visit Plan DN to Lt shoulder and cervical musculature; manual work; modalities; postural correction - general strengthening of postural musculature    Consulted and Agree with Plan of Care Patient      Patient will benefit from skilled therapeutic intervention in order to improve the following deficits and impairments:  Postural dysfunction, Improper body mechanics, Pain, Increased fascial restricitons, Increased muscle spasms, Decreased  range of motion, Decreased mobility, Decreased strength, Impaired UE functional use, Decreased activity tolerance  Visit Diagnosis: Cervicalgia  Chronic left shoulder pain  Other symptoms and signs involving the musculoskeletal system  Weakness generalized     Problem List Patient Active  Problem List   Diagnosis Date Noted  . Left cervical radiculopathy 07/20/2016  . 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HCC) 02/12/2015  . Type 2 diabetes mellitus without complication (HCC) 08/10/2014  . Chronic constipation 02/19/2014  . Rosacea 11/06/2013  . GAD (generalized anxiety disorder) 06/08/2013  . Essential hypertension, benign 05/04/2013  . Rheumatoid arthritis (HCC) 05/04/2013  . Migraine with aura 05/04/2013  . Palpitations 05/04/2013  . Murmur, heart 05/04/2013  . Hyperlipidemia 05/04/2013  . Hypothyroidism 05/04/2013    [name] Rober Minion PT, MPH  08/28/2016, 12:03 PM  [facility] [address] [place] [address] [place], [place], [address] Phone: [phone]   Fax:  [phone]  Name: Joann Maddox MRN: [id number] Date of Birth: 12/13/1940

## 2016-09-07 ENCOUNTER — Encounter: Payer: Self-pay | Admitting: Rehabilitative and Restorative Service Providers"

## 2016-09-07 ENCOUNTER — Ambulatory Visit (INDEPENDENT_AMBULATORY_CARE_PROVIDER_SITE_OTHER): Payer: Medicare Other | Admitting: Rehabilitative and Restorative Service Providers"

## 2016-09-07 DIAGNOSIS — M542 Cervicalgia: Secondary | ICD-10-CM | POA: Diagnosis not present

## 2016-09-07 DIAGNOSIS — G8929 Other chronic pain: Secondary | ICD-10-CM | POA: Diagnosis not present

## 2016-09-07 DIAGNOSIS — M25512 Pain in left shoulder: Secondary | ICD-10-CM

## 2016-09-07 DIAGNOSIS — R29898 Other symptoms and signs involving the musculoskeletal system: Secondary | ICD-10-CM | POA: Diagnosis not present

## 2016-09-07 DIAGNOSIS — R531 Weakness: Secondary | ICD-10-CM

## 2016-09-07 NOTE — Therapy (Signed)
[facility] [place] [address] [place] [address] [place], [place], [address] Phone: [phone]   Fax:  [phone]  Physical Therapy Treatment  Patient Details  Name: Joann Maddox MRN: [id number] Date of Birth: 04/24/1941 Referring Provider: Dr Benjamin Stain  Encounter Date: 09/07/2016      PT End of Session - 09/07/16 1439    Visit Number 8   Number of Visits 20   Date for PT Re-Evaluation 10/19/16   PT Start Time 1437   PT Stop Time 1535   PT Time Calculation (min) 58 min      Past Medical History:  Diagnosis Date  . Anxiety   . Headache(784.0)    migrains  . Heart murmur   . Hypothyroidism   . Rheumatoid arthritis(714.0)     Past Surgical History:  Procedure Laterality Date  . ABDOMINAL HYSTERECTOMY    . APPENDECTOMY    . CHOLECYSTECTOMY    . REPLACEMENT TOTAL KNEE BILATERAL    . TONSILLECTOMY     as  a child  . TOTAL HIP ARTHROPLASTY     right and left    There were no vitals filed for this visit.      Subjective Assessment - 09/07/16 1444    Subjective Joann Maddox reports that her shoulder is feeling better. She continues to have neck pain which is worse today. She feels the pain may be increased due to car travel in the past several days and did fall forward onto the sidewalk while she was getting out of a bike cab. The DN really helps with the pain.   Pertinent History multiple joint arthritis; bilat hip replacements and bilat knee replacements in the past 12 years - most recent joint replacement ~ 4-5 years ago; LBP; DDD cervical spine    How long can you sit comfortably? recliner 1-2 hours with pillow at neck; straight chair ~45 -60 min    How long can you stand comfortably? 3-5 min    How long can you walk comfortably? not at all    Diagnostic tests xrays    Patient Stated Goals eliminate the pain in the neck and loosen the shoulder up    Currently in Pain? Yes   Pain Score 8    Pain Location Neck   Pain Orientation  Left   Pain Descriptors / Indicators Aching;Dull   Pain Type Chronic pain   Pain Radiating Towards into Lt shoulder    Pain Onset More than a month ago   Pain Frequency Constant   Pain Score 7   Pain Location Shoulder   Pain Orientation Left   Pain Descriptors / Indicators Aching;Tightness   Pain Type Chronic pain            [facility] PT Assessment - 09/07/16 0001      Assessment   Medical Diagnosis Lt adhesive capsulitis; cervical dysfunction    Referring Provider Dr Benjamin Stain   Onset Date/Surgical Date 03/11/16   Hand Dominance Right   Prior Therapy none for neck and shoulder      AROM   Left Shoulder Flexion 105 Degrees   Left Shoulder ABduction 93 Degrees   Left Shoulder Internal Rotation 65 Degrees   Left Shoulder External Rotation 64 Degrees   Cervical Flexion 52   Cervical Extension 42   Cervical - Right Side Bend 37   Cervical - Left Side Bend 30   Cervical - Right Rotation 60   Cervical - Left Rotation 35  [facility] Adult PT Treatment/Exercise - 09/07/16 0001      Shoulder Exercises: Seated   ABduction Limitations W's - active movement to facilatate posterior shoudler girdle contraction    Other Seated Exercises axial extension 10 sec x 5      Shoulder Exercises: Standing   Extension Strengthening;Both;20 reps;Theraband   Theraband Level (Shoulder Extension) Level 1 (Yellow)   Row Strengthening;Both;20 reps;Theraband  upright row band secured at floor    Theraband Level (Shoulder Row) Level 1 (Yellow)   Retraction Strengthening;Both;20 reps;Theraband   Theraband Level (Shoulder Retraction) Level 1 (Yellow)   Other Standing Exercises scap squeeze w/ noodle 10 sec x 10    Other Standing Exercises scap retraction W's x 10     Shoulder Exercises: Pulleys   Flexion --  10 sec hold x 10   ABduction --  10 sec hold x 10 - scaption      Moist Heat Therapy   Number Minutes Moist Heat 20 Minutes   Moist Heat Location  Cervical;Shoulder     Electrical Stimulation   Electrical Stimulation Location Lt lateral cervical; Lt shoulder    Electrical Stimulation Action IFC and estim with DN    Electrical Stimulation Parameters to tolerance   Electrical Stimulation Goals Pain;Tone     Manual Therapy   Manual therapy comments pt sitting    Soft tissue mobilization Lt lateral and posterior cervical into the Lt upper trap area          Trigger Point Dry Needling - 09/07/16 1510    Consent Given? Yes   Muscles Treated Upper Body --  Lt neck and shoulder - estim    Scalenes Response Palpable increased muscle length   Upper Trapezius Response Palpable increased muscle length   Oblique Capitus Response Palpable increased muscle length   Longissimus Response Palpable increased muscle length                   PT Long Term Goals - 09/07/16 1440      PT LONG TERM GOAL #1   Title Increase AROM cervical spine by 5-10 degrees in all planes 10/19/16   Time 12   Period Weeks   Status Revised     PT LONG TERM GOAL #2   Title Increase AROM Lt shoudler by 5-15 degrees in all planes 10/19/16   Time 12   Period Weeks   Status Revised     PT LONG TERM GOAL #3   Title Increase strength Lt/Rt shoulders allowing patient to use UE's for more functional activities 10/19/16    Time 12   Period Weeks   Status Revised     PT LONG TERM GOAL #4   Title Decrease frequency, intensity, and/or duration or pain by 25-30% allowing patient to move with greater ease and participate in daily activities 10/19/16   Time 12   Period Weeks   Status Revised     PT LONG TERM GOAL #5   Title Independent in HEP 10/19/16   Time 12   Period Weeks   Status Revised     PT LONG TERM GOAL #6   Title Improve FOTO to </= 41% limitation 10/19/16   Time 12   Status Revised               Plan - 09/07/16 1442    Clinical Impression Statement Joann Maddox demonstrates some improvementin shoulder and cervical mobility and ROM. She  has responded well to DN and manual work in the clinic.  Joann Maddox continues to have difficulty with functional changes and reports incresae in symptoms with travel; riding in the car; use of UE's. She has made some gradual progress toward stated goals of therapy but will benefit from continued treatment to acheive goals and reach maximum rehab potential.    Rehab Potential Fair   Clinical Impairments Affecting Rehab Potential multiple co-morbities    PT Frequency 2x / week   PT Duration 6 weeks   PT Treatment/Interventions Patient/family education;ADLs/Self Care Home Management;Cryotherapy;Electrical Stimulation;Iontophoresis 4mg /ml Dexamethasone;Moist Heat;Ultrasound;Dry needling;Manual techniques;Therapeutic activities;Therapeutic exercise   PT Next Visit Plan DN to Lt shoulder and cervical musculature; manual work; modalities; postural correction - general strengthening of postural musculature    Consulted and Agree with Plan of Care Patient      Patient will benefit from skilled therapeutic intervention in order to improve the following deficits and impairments:  Postural dysfunction, Improper body mechanics, Pain, Increased fascial restricitons, Increased muscle spasms, Decreased range of motion, Decreased mobility, Decreased strength, Impaired UE functional use, Decreased activity tolerance  Visit Diagnosis: Cervicalgia - Plan: PT plan of care cert/re-cert  Chronic left shoulder pain - Plan: PT plan of care cert/re-cert  Other symptoms and signs involving the musculoskeletal system - Plan: PT plan of care cert/re-cert  Weakness generalized - Plan: PT plan of care cert/re-cert     Problem List Patient Active Problem List   Diagnosis Date Noted  . Left cervical radiculopathy 07/20/2016  . 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HCC) 02/12/2015  . Type 2 diabetes mellitus without complication (HCC) 08/10/2014  . Chronic  constipation 02/19/2014  . Rosacea 11/06/2013  . GAD (generalized anxiety disorder) 06/08/2013  . Essential hypertension, benign 05/04/2013  . Rheumatoid arthritis (HCC) 05/04/2013  . Migraine with aura 05/04/2013  . Palpitations 05/04/2013  . Murmur, heart 05/04/2013  . Hyperlipidemia 05/04/2013  . Hypothyroidism 05/04/2013    Joann Maddox 07/02/2013 PT, MPH  09/07/2016, 3:23 PM  [facility] [address] [place] [address] [place], [place], [place] Phone: [phone]   Fax:  [phone]  Name: Joann Maddox MRN: Lowry Ram Date of Birth: 08-Jul-1940

## 2016-09-08 ENCOUNTER — Other Ambulatory Visit: Payer: Self-pay | Admitting: Family Medicine

## 2016-09-10 ENCOUNTER — Ambulatory Visit (INDEPENDENT_AMBULATORY_CARE_PROVIDER_SITE_OTHER): Payer: Medicare Other | Admitting: Rehabilitative and Restorative Service Providers"

## 2016-09-10 ENCOUNTER — Other Ambulatory Visit: Payer: Self-pay | Admitting: Family Medicine

## 2016-09-10 ENCOUNTER — Encounter: Payer: Self-pay | Admitting: Rehabilitative and Restorative Service Providers"

## 2016-09-10 DIAGNOSIS — G8929 Other chronic pain: Secondary | ICD-10-CM | POA: Diagnosis not present

## 2016-09-10 DIAGNOSIS — R29898 Other symptoms and signs involving the musculoskeletal system: Secondary | ICD-10-CM

## 2016-09-10 DIAGNOSIS — R531 Weakness: Secondary | ICD-10-CM

## 2016-09-10 DIAGNOSIS — M25512 Pain in left shoulder: Secondary | ICD-10-CM | POA: Diagnosis not present

## 2016-09-10 DIAGNOSIS — M542 Cervicalgia: Secondary | ICD-10-CM

## 2016-09-10 NOTE — Therapy (Signed)
[facility] [place] [address] [place] [address] [place], [place], [address] Phone: [phone]   Fax:  [phone]  Physical Therapy Treatment  Patient Details  Name: Joann Maddox MRN: [id number] Date of Birth: 11-13-1940 Referring Provider: Dr Benjamin Stain  Encounter Date: 09/10/2016      PT End of Session - 09/10/16 1405    Visit Number 9   Number of Visits 20   Date for PT Re-Evaluation 10/19/16   PT Start Time 1405   PT Stop Time 1501   PT Time Calculation (min) 56 min   Activity Tolerance Patient tolerated treatment well      Past Medical History:  Diagnosis Date  . Anxiety   . Headache(784.0)    migrains  . Heart murmur   . Hypothyroidism   . Rheumatoid arthritis(714.0)     Past Surgical History:  Procedure Laterality Date  . ABDOMINAL HYSTERECTOMY    . APPENDECTOMY    . CHOLECYSTECTOMY    . REPLACEMENT TOTAL KNEE BILATERAL    . TONSILLECTOMY     as  a child  . TOTAL HIP ARTHROPLASTY     right and left    There were no vitals filed for this visit.      Subjective Assessment - 09/10/16 1407    Subjective [name] reports that she had a great day Monday after treatment. She had a great day Tuesday and not as much so yesterday. She is having pain in her feet and needs to hava bunion checked.    Currently in Pain? Yes   Pain Score 6    Pain Location Neck   Pain Orientation Left   Pain Descriptors / Indicators Aching;Dull   Pain Type Chronic pain   Pain Onset More than a month ago   Pain Frequency Constant   Pain Score 6   Pain Location Shoulder   Pain Orientation Left   Pain Descriptors / Indicators Aching;Tightness   Pain Type Chronic pain                         [facility] Adult PT Treatment/Exercise - 09/10/16 0001      Shoulder Exercises: Standing   Extension Strengthening;Both;20 reps;Theraband   Theraband Level (Shoulder Extension) Level 1 (Yellow)   Row Strengthening;Both;20 reps;Theraband   upright row band secured at floor    Theraband Level (Shoulder Row) Level 1 (Yellow)   Retraction Strengthening;Both;20 reps;Theraband   Theraband Level (Shoulder Retraction) Level 1 (Yellow)   Other Standing Exercises hip extension x 10 hands on counter; x 10 hands on wall just below chest height to engourage hip and trunk extension    Other Standing Exercises scap squeeze with noodle 10 sec x 10; scap retraction W's x 10     Shoulder Exercises: Pulleys   Flexion --  10 sec hold x 10   ABduction --  10 sec hold x 10 - scaption      Moist Heat Therapy   Number Minutes Moist Heat 20 Minutes   Moist Heat Location Cervical;Shoulder     Electrical Stimulation   Electrical Stimulation Location Lt lateral cervical; Lt shoulder    Electrical Stimulation Action IFC and estim with DN   Electrical Stimulation Parameters to tolerance   Electrical Stimulation Goals Pain;Tone     Manual Therapy   Manual therapy comments pt sitting    Soft tissue mobilization Lt lateral and posterior cervical into the Lt upper trap area  Trigger Point Dry Needling - 09/10/16 1436    Consent Given? Yes   Muscles Treated Upper Body --  Lt   Scalenes Response Palpable increased muscle length   Upper Trapezius Response Palpable increased muscle length   Oblique Capitus Response Palpable increased muscle length   Pectoralis Major Response Palpable increased muscle length   Longissimus Response Palpable increased muscle length                   PT Long Term Goals - 09/07/16 1440      PT LONG TERM GOAL #1   Title Increase AROM cervical spine by 5-10 degrees in all planes 10/19/16   Time 12   Period Weeks   Status Revised     PT LONG TERM GOAL #2   Title Increase AROM Lt shoudler by 5-15 degrees in all planes 10/19/16   Time 12   Period Weeks   Status Revised     PT LONG TERM GOAL #3   Title Increase strength Lt/Rt shoulders allowing patient to use UE's for more functional  activities 10/19/16    Time 12   Period Weeks   Status Revised     PT LONG TERM GOAL #4   Title Decrease frequency, intensity, and/or duration or pain by 25-30% allowing patient to move with greater ease and participate in daily activities 10/19/16   Time 12   Period Weeks   Status Revised     PT LONG TERM GOAL #5   Title Independent in HEP 10/19/16   Time 12   Period Weeks   Status Revised     PT LONG TERM GOAL #6   Title Improve FOTO to </= 41% limitation 10/19/16   Time 12   Status Revised               Plan - 09/10/16 1410    Clinical Impression Statement Tejasvi continues to have pain and discomfort in Lt neck and shoulder. She has imporvement with DN and manual work but continues to struggle with poor posture and alignmnet. altered body mechanics and abnormal movement patterns.    Rehab Potential Fair   Clinical Impairments Affecting Rehab Potential multiple co-morbities    PT Frequency 2x / week   PT Duration 6 weeks   PT Treatment/Interventions Patient/family education;ADLs/Self Care Home Management;Cryotherapy;Electrical Stimulation;Iontophoresis 4mg /ml Dexamethasone;Moist Heat;Ultrasound;Dry needling;Manual techniques;Therapeutic activities;Therapeutic exercise   PT Next Visit Plan DN to Lt shoulder and cervical musculature; manual work; modalities; postural correction - general strengthening of postural musculature    Consulted and Agree with Plan of Care Patient      Patient will benefit from skilled therapeutic intervention in order to improve the following deficits and impairments:  Postural dysfunction, Improper body mechanics, Pain, Increased fascial restricitons, Increased muscle spasms, Decreased range of motion, Decreased mobility, Decreased strength, Impaired UE functional use, Decreased activity tolerance  Visit Diagnosis: Cervicalgia  Chronic left shoulder pain  Other symptoms and signs involving the musculoskeletal system  Weakness  generalized     Problem List Patient Active Problem List   Diagnosis Date Noted  . Left cervical radiculopathy 07/20/2016  . 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HCC) 02/12/2015  . Type 2 diabetes mellitus without complication (HCC) 08/10/2014  . Chronic constipation 02/19/2014  . Rosacea 11/06/2013  . GAD (generalized anxiety disorder) 06/08/2013  . Essential hypertension, benign 05/04/2013  . Rheumatoid arthritis (HCC) 05/04/2013  . Migraine with aura 05/04/2013  .  Palpitations 05/04/2013  . Murmur, heart 05/04/2013  . Hyperlipidemia 05/04/2013  . Hypothyroidism 05/04/2013    Devlin Mcveigh Rober Minion PT, MPH  09/10/2016, 2:38 PM  [facility] [address] [place] [address] [place], [place], [address] Phone: [phone]   Fax:  [phone]  Name: KYLE STANSELL MRN: [id number] Date of Birth: 1941-04-09

## 2016-09-14 ENCOUNTER — Ambulatory Visit (INDEPENDENT_AMBULATORY_CARE_PROVIDER_SITE_OTHER): Payer: Medicare Other | Admitting: Rehabilitative and Restorative Service Providers"

## 2016-09-14 ENCOUNTER — Encounter: Payer: Self-pay | Admitting: Rehabilitative and Restorative Service Providers"

## 2016-09-14 DIAGNOSIS — M542 Cervicalgia: Secondary | ICD-10-CM

## 2016-09-14 DIAGNOSIS — G8929 Other chronic pain: Secondary | ICD-10-CM

## 2016-09-14 DIAGNOSIS — M25512 Pain in left shoulder: Secondary | ICD-10-CM

## 2016-09-14 DIAGNOSIS — R531 Weakness: Secondary | ICD-10-CM

## 2016-09-14 DIAGNOSIS — R29898 Other symptoms and signs involving the musculoskeletal system: Secondary | ICD-10-CM | POA: Diagnosis not present

## 2016-09-14 NOTE — Therapy (Signed)
[facility] [place] [address] [place] [address] [place], [place], [address] Phone: [phone]   Fax:  [phone]  Physical Therapy Treatment  Patient Details  Name: Joann Maddox MRN: [id number] Date of Birth: [birth year] Referring Provider: Dr Benjamin Stain  Encounter Date: 09/14/2016      PT End of Session - 09/14/16 1504    Visit Number 10   Number of Visits 20   Date for PT Re-Evaluation 10/19/16   PT Start Time 1355   PT Stop Time 1454   PT Time Calculation (min) 59 min   Activity Tolerance Patient tolerated treatment well      Past Medical History:  Diagnosis Date  . Anxiety   . Headache(784.0)    migrains  . Heart murmur   . Hypothyroidism   . Rheumatoid arthritis(714.0)     Past Surgical History:  Procedure Laterality Date  . ABDOMINAL HYSTERECTOMY    . APPENDECTOMY    . CHOLECYSTECTOMY    . REPLACEMENT TOTAL KNEE BILATERAL    . TONSILLECTOMY     as  a child  . TOTAL HIP ARTHROPLASTY     right and left    There were no vitals filed for this visit.          [facility] PT Assessment - 09/14/16 0001      Assessment   Medical Diagnosis Lt adhesive capsulitis; cervical dysfunction    Referring Provider Dr Benjamin Stain   Onset Date/Surgical Date 03/11/16   Hand Dominance Right   Prior Therapy none for neck and shoulder      Observation/Other Assessments   Focus on Therapeutic Outcomes (FOTO)  57% limitation      AROM   Right Shoulder Extension 68 Degrees   Right Shoulder Flexion 149 Degrees   Right Shoulder ABduction 133 Degrees   Right Shoulder Internal Rotation 61 Degrees   Right Shoulder External Rotation 83 Degrees   Left Shoulder Extension 48 Degrees   Left Shoulder Flexion 106 Degrees  patterns of substitution noted   Left Shoulder ABduction 95 Degrees   Left Shoulder Internal Rotation 65 Degrees   Left Shoulder External Rotation 66 Degrees   Cervical Flexion 55   Cervical Extension 40   Cervical -  Right Side Bend 32   Cervical - Left Side Bend 30   Cervical - Right Rotation 58   Cervical - Left Rotation 34     Strength   Overall Strength --  improved scapular retraction    Right Shoulder Flexion --  5-/5   Right Shoulder Extension 5/5   Right Shoulder ABduction --  5-/5   Right Shoulder Internal Rotation 4+/5   Right Shoulder External Rotation 4+/5   Left Shoulder Flexion 4-/5   Left Shoulder Extension 4+/5   Left Shoulder ABduction 4-/5   Left Shoulder Internal Rotation 4/5   Left Shoulder External Rotation 4-/5     Palpation   Palpation comment muscular tightness Lt > Rt upper quarter including upper traps; leveator; pecs; ant/lat/posterior cervical musculature; anterior deltoid; biceps                      [facility] Adult PT Treatment/Exercise - 09/14/16 0001      Shoulder Exercises: Standing   Extension Strengthening;Both;20 reps;Theraband   Theraband Level (Shoulder Extension) Level 2 (Red)   Row Strengthening;Both;20 reps;Theraband  upright row band secured at floor    Theraband Level (Shoulder Row) Level 2 (Red)   Retraction Strengthening;Both;20 reps;Theraband   Theraband  Level (Shoulder Retraction) Level 2 (Red)   Other Standing Exercises hip extension x 10 hands on counter; x 10 hands on wall just below chest height to engourage hip and trunk extension    Other Standing Exercises scap squeeze with noodle 10 sec x 10; scap retraction W's x 10     Shoulder Exercises: Pulleys   Flexion --  10 sec hold x 10   ABduction --  10 sec hold x 10 - scaption      Moist Heat Therapy   Number Minutes Moist Heat 20 Minutes   Moist Heat Location Cervical;Shoulder     Electrical Stimulation   Electrical Stimulation Location Lt lateral cervical; Lt shoulder    Electrical Stimulation Action IFC and estim with DN    Electrical Stimulation Parameters to tolerance    Electrical Stimulation Goals Pain;Tone     Manual Therapy   Manual therapy comments pt  sitting    Soft tissue mobilization Lt lateral and posterior cervical into the Lt upper trap area     Neck Exercises: Stretches   Upper Trapezius Stretch 3 reps;10 seconds          Trigger Point Dry Needling - 09/14/16 1614    Consent Given? Yes   Muscles Treated Upper Body --  Lt for all DN - deltoid - palpable decresae in tightness    Scalenes Response Palpable increased muscle length   Upper Trapezius Response Palpable increased muscle length   Oblique Capitus Response Palpable increased muscle length   Pectoralis Major Response Palpable increased muscle length   Longissimus Response Palpable increased muscle length                   PT Long Term Goals - 09/14/16 1558      PT LONG TERM GOAL #1   Title Increase AROM cervical spine by 5-10 degrees in all planes 10/19/16   Time 12   Period Weeks   Status On-going     PT LONG TERM GOAL #2   Title Increase AROM Lt shoudler by 5-15 degrees in all planes 10/19/16   Time 12   Period Weeks   Status On-going     PT LONG TERM GOAL #3   Title Increase strength Lt/Rt shoulders allowing patient to use UE's for more functional activities 10/19/16    Time 12   Period Weeks   Status On-going     PT LONG TERM GOAL #4   Title Decrease frequency, intensity, and/or duration or pain by 25-30% allowing patient to move with greater ease and participate in daily activities 10/19/16   Time 12   Period Weeks   Status On-going     PT LONG TERM GOAL #5   Title Independent in HEP 10/19/16   Time 12   Period Weeks   Status On-going     PT LONG TERM GOAL #6   Title Improve FOTO to </= 41% limitation 10/19/16   Time 12   Period Weeks   Status On-going               Plan - 09/14/16 1559    Clinical Impression Statement Joann Maddox demonstrates some improvement with therapy and HEP. She has less pain today with notable decrease in palpable tightness through the cervical musculature. She demonstrates improvement in strength in  Bilat UE's with some increase in ROM in some planes. She is progressing toward stated goals of therapy and will benefit fro mcontinued PT to achieve improved carry over of  pain control and increase functional activities.    Rehab Potential Fair   Clinical Impairments Affecting Rehab Potential multiple co-morbities    PT Frequency 2x / week   PT Duration 6 weeks   PT Treatment/Interventions Patient/family education;ADLs/Self Care Home Management;Cryotherapy;Electrical Stimulation;Iontophoresis 4mg /ml Dexamethasone;Moist Heat;Ultrasound;Dry needling;Manual techniques;Therapeutic activities;Therapeutic exercise   PT Next Visit Plan DN to Lt shoulder and cervical musculature; manual work; modalities; postural correction - general strengthening of postural musculature    Consulted and Agree with Plan of Care Patient      Patient will benefit from skilled therapeutic intervention in order to improve the following deficits and impairments:  Postural dysfunction, Improper body mechanics, Pain, Increased fascial restricitons, Increased muscle spasms, Decreased range of motion, Decreased mobility, Decreased strength, Impaired UE functional use, Decreased activity tolerance  Visit Diagnosis: Cervicalgia  Chronic left shoulder pain  Other symptoms and signs involving the musculoskeletal system  Weakness generalized     Problem List Patient Active Problem List   Diagnosis Date Noted  . Left cervical radiculopathy 07/20/2016  . 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HCC) 02/12/2015  . Type 2 diabetes mellitus without complication (HCC) 08/10/2014  . Chronic constipation 02/19/2014  . Rosacea 11/06/2013  . GAD (generalized anxiety disorder) 06/08/2013  . Essential hypertension, benign 05/04/2013  . Rheumatoid arthritis (HCC) 05/04/2013  . Migraine with aura 05/04/2013  . Palpitations 05/04/2013  . Murmur, heart 05/04/2013  .  Hyperlipidemia 05/04/2013  . Hypothyroidism 05/04/2013    [name] 07/02/2013 PT, MPH  09/14/2016, 4:16 PM  [facility] [address] [place] [address] [place], [place], [place] Phone: [phone]   Fax:  [phone]  Name: Joann Maddox MRN: Lowry Ram Date of Birth: Jan 13, 1941

## 2016-09-17 ENCOUNTER — Ambulatory Visit (INDEPENDENT_AMBULATORY_CARE_PROVIDER_SITE_OTHER): Payer: Medicare Other | Admitting: Rehabilitative and Restorative Service Providers"

## 2016-09-17 ENCOUNTER — Encounter: Payer: Self-pay | Admitting: Rehabilitative and Restorative Service Providers"

## 2016-09-17 DIAGNOSIS — R531 Weakness: Secondary | ICD-10-CM

## 2016-09-17 DIAGNOSIS — R29898 Other symptoms and signs involving the musculoskeletal system: Secondary | ICD-10-CM

## 2016-09-17 DIAGNOSIS — G8929 Other chronic pain: Secondary | ICD-10-CM | POA: Diagnosis not present

## 2016-09-17 DIAGNOSIS — M25512 Pain in left shoulder: Secondary | ICD-10-CM

## 2016-09-17 DIAGNOSIS — M542 Cervicalgia: Secondary | ICD-10-CM

## 2016-09-17 NOTE — Therapy (Signed)
[facility] [name] [address] [place], [place], [address] Phone: [phone]   Fax:  [phone]  Physical Therapy Treatment  Patient Details  Name: Joann Maddox MRN: [id number] Date of Birth: 1940-05-15 Referring Provider: Dr Benjamin Stain  Encounter Date: 09/17/2016      PT End of Session - 09/17/16 1550    Visit Number 11   Number of Visits 20   Date for PT Re-Evaluation 10/19/16   PT Start Time 1550   PT Stop Time 1638   PT Time Calculation (min) 48 min   Activity Tolerance Patient tolerated treatment well      Past Medical History:  Diagnosis Date  . Anxiety   . Headache(784.0)    migrains  . Heart murmur   . Hypothyroidism   . Rheumatoid arthritis(714.0)     Past Surgical History:  Procedure Laterality Date  . ABDOMINAL HYSTERECTOMY    . APPENDECTOMY    . CHOLECYSTECTOMY    . REPLACEMENT TOTAL KNEE BILATERAL    . TONSILLECTOMY     as  a child  . TOTAL HIP ARTHROPLASTY     right and left    There were no vitals filed for this visit.      Subjective Assessment - 09/17/16 1551    Subjective Neck is painful today. Shoulder feels better - not great yesterday but better today. She is trying to practice her posture and using her noodle when sitting.    Currently in Pain? Yes   Pain Score 7    Pain Location Neck   Pain Orientation Left   Pain Descriptors / Indicators Aching;Dull   Pain Type Chronic pain   Pain Radiating Towards into the Lt shoulder    Pain Onset More than a month ago   Pain Frequency Constant   Pain Score 6   Pain Location Shoulder   Pain Orientation Left   Pain Descriptors / Indicators Aching;Tightness   Pain Type Chronic pain   Pain Onset More than a month ago                         [facility] Adult PT Treatment/Exercise - 09/17/16 0001      Shoulder Exercises: Seated   Other Seated Exercises W's x 10    Other Seated Exercises elbows to back pockets x 10       Moist Heat Therapy   Number Minutes Moist Heat 20 Minutes   Moist Heat Location Cervical;Shoulder     Electrical Stimulation   Electrical Stimulation Location Lt lateral cervical; Lt shoulder    Electrical Stimulation Action IFC and estim with DN   Electrical Stimulation Parameters to tolerance   Electrical Stimulation Goals Pain;Tone     Manual Therapy   Manual therapy comments pt sitting    Soft tissue mobilization Lt lateral and posterior/lateral cervical into the Lt upper trap area     Neck Exercises: Stretches   Upper Trapezius Stretch 3 reps;10 seconds   Other Neck Stretches axial extension 10 sec x 10 in sitting           Trigger Point Dry Needling - 09/17/16 1610    Consent Given? Yes   Muscles Treated Upper Body --  Lt - DN with estim    Scalenes Response Palpable increased muscle length   Upper Trapezius Response Palpable increased muscle length   Oblique Capitus Response Palpable increased muscle length   Pectoralis Major Response Palpable increased muscle length  Longissimus Response Palpable increased muscle length                   PT Long Term Goals - 09/14/16 1558      PT LONG TERM GOAL #1   Title Increase AROM cervical spine by 5-10 degrees in all planes 10/19/16   Time 12   Period Weeks   Status On-going     PT LONG TERM GOAL #2   Title Increase AROM Lt shoudler by 5-15 degrees in all planes 10/19/16   Time 12   Period Weeks   Status On-going     PT LONG TERM GOAL #3   Title Increase strength Lt/Rt shoulders allowing patient to use UE's for more functional activities 10/19/16    Time 12   Period Weeks   Status On-going     PT LONG TERM GOAL #4   Title Decrease frequency, intensity, and/or duration or pain by 25-30% allowing patient to move with greater ease and participate in daily activities 10/19/16   Time 12   Period Weeks   Status On-going     PT LONG TERM GOAL #5   Title Independent in HEP 10/19/16   Time 12   Period Weeks    Status On-going     PT LONG TERM GOAL #6   Title Improve FOTO to </= 41% limitation 10/19/16   Time 12   Period Weeks   Status On-going               Plan - 09/17/16 1610    Clinical Impression Statement Continued pain in the Lt cervical and shoudler area. Note less palpable tightness in the posterior/lateral cervical musculature. She responds well to DN with estim followed by manual work and estim.     Rehab Potential Good   Clinical Impairments Affecting Rehab Potential multiple co-morbities    PT Frequency 2x / week   PT Duration 6 weeks   PT Treatment/Interventions Patient/family education;ADLs/Self Care Home Management;Cryotherapy;Electrical Stimulation;Iontophoresis 4mg /ml Dexamethasone;Moist Heat;Ultrasound;Dry needling;Manual techniques;Therapeutic activities;Therapeutic exercise   PT Next Visit Plan DN to Lt shoulder and cervical musculature; manual work; modalities; postural correction - general strengthening of postural musculature    Consulted and Agree with Plan of Care Patient      Patient will benefit from skilled therapeutic intervention in order to improve the following deficits and impairments:  Postural dysfunction, Improper body mechanics, Pain, Increased fascial restricitons, Increased muscle spasms, Decreased range of motion, Decreased mobility, Decreased strength, Impaired UE functional use, Decreased activity tolerance  Visit Diagnosis: Cervicalgia  Chronic left shoulder pain  Other symptoms and signs involving the musculoskeletal system  Weakness generalized     Problem List Patient Active Problem List   Diagnosis Date Noted  . Left cervical radiculopathy 07/20/2016  . 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HCC) 02/12/2015  . Type 2 diabetes mellitus without complication (HCC) 08/10/2014  . Chronic constipation 02/19/2014  . Rosacea 11/06/2013  . GAD (generalized anxiety  disorder) 06/08/2013  . Essential hypertension, benign 05/04/2013  . Rheumatoid arthritis (HCC) 05/04/2013  . Migraine with aura 05/04/2013  . Palpitations 05/04/2013  . Murmur, heart 05/04/2013  . Hyperlipidemia 05/04/2013  . Hypothyroidism 05/04/2013    [name] 07/02/2013 PT, MPH  09/17/2016, 4:14 PM  [facility] [address] [place] [address] [place], [place], [place] Phone: [phone]   Fax:  [phone]  Name: KINSLEY HOLDERMAN MRN: Lowry Ram Date of Birth:  03/29/1941   

## 2016-09-22 ENCOUNTER — Ambulatory Visit (INDEPENDENT_AMBULATORY_CARE_PROVIDER_SITE_OTHER): Payer: Medicare Other | Admitting: Rehabilitative and Restorative Service Providers"

## 2016-09-22 DIAGNOSIS — M542 Cervicalgia: Secondary | ICD-10-CM | POA: Diagnosis not present

## 2016-09-22 DIAGNOSIS — G8929 Other chronic pain: Secondary | ICD-10-CM | POA: Diagnosis not present

## 2016-09-22 DIAGNOSIS — R29898 Other symptoms and signs involving the musculoskeletal system: Secondary | ICD-10-CM | POA: Diagnosis not present

## 2016-09-22 DIAGNOSIS — M25512 Pain in left shoulder: Secondary | ICD-10-CM | POA: Diagnosis not present

## 2016-09-22 DIAGNOSIS — R531 Weakness: Secondary | ICD-10-CM

## 2016-09-22 NOTE — Therapy (Signed)
[facility] [place] [address] [place] [address] [place], [place], [address] Phone: [phone]   Fax:  [phone]  Physical Therapy Treatment  Patient Details  Name: Joann Maddox MRN: [id number] Date of Birth: [birth year] Referring Provider: Dr Benjamin Stain  Encounter Date: 09/22/2016      PT End of Session - 09/22/16 1107    Visit Number 12   Number of Visits 20   Date for PT Re-Evaluation 10/19/16   PT Start Time 1104   PT Stop Time 1200   PT Time Calculation (min) 56 min   Activity Tolerance Patient tolerated treatment well      Past Medical History:  Diagnosis Date  . Anxiety   . Headache(784.0)    migrains  . Heart murmur   . Hypothyroidism   . Rheumatoid arthritis(714.0)     Past Surgical History:  Procedure Laterality Date  . ABDOMINAL HYSTERECTOMY    . APPENDECTOMY    . CHOLECYSTECTOMY    . REPLACEMENT TOTAL KNEE BILATERAL    . TONSILLECTOMY     as  a child  . TOTAL HIP ARTHROPLASTY     right and left    There were no vitals filed for this visit.      Subjective Assessment - 09/22/16 1108    Subjective Continued pain in the neck and shoulder. Can tell the movement in the shoudler is better but it still hurts. DN is helpful. Trying to workon exercises at home.    Currently in Pain? Yes   Pain Score 8    Pain Location Neck   Pain Orientation Left   Pain Descriptors / Indicators Aching;Dull   Pain Type Chronic pain   Pain Onset More than a month ago   Pain Score 8   Pain Location Shoulder   Pain Orientation Left   Pain Descriptors / Indicators Aching;Tightness   Pain Type Chronic pain   Pain Onset More than a month ago            [facility] PT Assessment - 09/22/16 0001      Assessment   Medical Diagnosis Lt adhesive capsulitis; cervical dysfunction    Referring Provider Dr Benjamin Stain   Onset Date/Surgical Date 03/11/16   Hand Dominance Right   Prior Therapy none for neck and shoulder      AROM   Right Shoulder Extension 70 Degrees   Right Shoulder Flexion 148 Degrees   Right Shoulder ABduction 135 Degrees   Right Shoulder Internal Rotation 60 Degrees   Right Shoulder External Rotation 82 Degrees   Cervical - Right Side Bend 34   Cervical - Left Side Bend 31   Cervical - Left Rotation 36     Palpation   Palpation comment muscular tightness Lt > Rt upper quarter including upper traps; leveator; pecs; ant/lat/posterior cervical musculature; anterior deltoid; biceps                      [facility] Adult PT Treatment/Exercise - 09/22/16 0001      Shoulder Exercises: Standing   Extension Strengthening;Both;20 reps;Theraband   Theraband Level (Shoulder Extension) Level 2 (Red)   Row Strengthening;Both;20 reps;Theraband   Theraband Level (Shoulder Row) Level 2 (Red)   Row Limitations row at 45 deg red TB x 20   Retraction Strengthening;Both;20 reps;Theraband   Theraband Level (Shoulder Retraction) Level 2 (Red)   Other Standing Exercises hip extension x 10 hands on counter; x 10 hands on wall just below chest height to engourage  hip and trunk extension   Other Standing Exercises scap squeeze with noodle 10 sec x 10; scap retraction W's x 10; active row from shallow knee bend x 10      Shoulder Exercises: Pulleys   Flexion --  10 sec x 10    ABduction --  10 sec x 10      Moist Heat Therapy   Number Minutes Moist Heat 20 Minutes   Moist Heat Location Cervical;Shoulder     Electrical Stimulation   Electrical Stimulation Location Lt lateral cervical; Lt shoulder    Electrical Stimulation Action IFC and estim with DN   Electrical Stimulation Parameters to tolerance   Electrical Stimulation Goals Pain;Tone     Manual Therapy   Manual therapy comments pt sitting    Soft tissue mobilization Lt lateral and posterior/lateral cervical into the Lt upper trap area     Neck Exercises: Stretches   Upper Trapezius Stretch 3 reps;10 seconds   Other Neck Stretches axial  extension 10 sec x 10 in sitting           Trigger Point Dry Needling - 09/22/16 1256    Consent Given? Yes   Muscles Treated Upper Body --  all DN on Lt - estim with DN   Scalenes Response Palpable increased muscle length   Upper Trapezius Response Palpable increased muscle length   Oblique Capitus Response Palpable increased muscle length   Pectoralis Major Response Palpable increased muscle length   Longissimus Response Palpable increased muscle length                   PT Long Term Goals - 09/22/16 1107      PT LONG TERM GOAL #1   Title Increase AROM cervical spine by 5-10 degrees in all planes 10/19/16   Time 12   Period Weeks   Status On-going     PT LONG TERM GOAL #2   Title Increase AROM Lt shoudler by 5-15 degrees in all planes 10/19/16   Time 12   Period Weeks   Status On-going     PT LONG TERM GOAL #3   Title Increase strength Lt/Rt shoulders allowing patient to use UE's for more functional activities 10/19/16    Time 12   Period Weeks   Status On-going     PT LONG TERM GOAL #4   Title Decrease frequency, intensity, and/or duration or pain by 25-30% allowing patient to move with greater ease and participate in daily activities 10/19/16   Time 12   Period Weeks   Status On-going     PT LONG TERM GOAL #5   Title Independent in HEP 10/19/16   Time 12   Period Weeks   Status On-going     PT LONG TERM GOAL #6   Title Improve FOTO to </= 41% limitation 10/19/16   Time 12   Period Weeks   Status On-going               Plan - 09/22/16 1257    Clinical Impression Statement Shakeerah demonstrates some improved trunk and hip extension with exercises but continues to demonstrate forward posture and alignment with standing and walking without verbal cues. She demonstrates increased shoudler and cervical ROM but remains limited in end ranges shoudler. shoulder pain at night has subsided and she is using Lt UE for more functional activities. Cervical ROM  remains painful and limited as noted. Tightness to palpation persists in lateral and posterior cervical musculature as well  as into the upper trap/leveator/pecs. Fynley responds well to DN with e-stim and manual work. She is gradually progressing toward goals of therapy.    Rehab Potential Good   Clinical Impairments Affecting Rehab Potential multiple co-morbities    PT Frequency 2x / week   PT Duration 6 weeks   PT Treatment/Interventions Patient/family education;ADLs/Self Care Home Management;Cryotherapy;Electrical Stimulation;Iontophoresis 4mg /ml Dexamethasone;Moist Heat;Ultrasound;Dry needling;Manual techniques;Therapeutic activities;Therapeutic exercise   PT Next Visit Plan DN to Lt shoulder and cervical musculature; manual work; modalities; postural correction - general strengthening of postural musculature    Consulted and Agree with Plan of Care Patient      Patient will benefit from skilled therapeutic intervention in order to improve the following deficits and impairments:  Postural dysfunction, Improper body mechanics, Pain, Increased fascial restricitons, Increased muscle spasms, Decreased range of motion, Decreased mobility, Decreased strength, Impaired UE functional use, Decreased activity tolerance  Visit Diagnosis: Cervicalgia  Chronic left shoulder pain  Other symptoms and signs involving the musculoskeletal system  Weakness generalized     Problem List Patient Active Problem List   Diagnosis Date Noted  . Left cervical radiculopathy 07/20/2016  . 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HCC) 02/12/2015  . Type 2 diabetes mellitus without complication (HCC) 08/10/2014  . Chronic constipation 02/19/2014  . Rosacea 11/06/2013  . GAD (generalized anxiety disorder) 06/08/2013  . Essential hypertension, benign 05/04/2013  . Rheumatoid arthritis (HCC) 05/04/2013  . Migraine with aura 05/04/2013  . Palpitations  05/04/2013  . Murmur, heart 05/04/2013  . Hyperlipidemia 05/04/2013  . Hypothyroidism 05/04/2013    Hayat [place] Rober Minion PT, MPH  09/22/2016, 1:06 PM  [facility] [address] [place], [place], [address] Phone: [phone]   Fax:  [phone]  Name: KADYNN SCHANTZ MRN: [id number] Date of Birth: 06/16/1940

## 2016-09-24 ENCOUNTER — Ambulatory Visit (INDEPENDENT_AMBULATORY_CARE_PROVIDER_SITE_OTHER): Payer: Medicare Other | Admitting: Physical Therapy

## 2016-09-24 DIAGNOSIS — G8929 Other chronic pain: Secondary | ICD-10-CM

## 2016-09-24 DIAGNOSIS — M25512 Pain in left shoulder: Secondary | ICD-10-CM

## 2016-09-24 DIAGNOSIS — R531 Weakness: Secondary | ICD-10-CM | POA: Diagnosis not present

## 2016-09-24 DIAGNOSIS — R29898 Other symptoms and signs involving the musculoskeletal system: Secondary | ICD-10-CM | POA: Diagnosis not present

## 2016-09-24 DIAGNOSIS — M542 Cervicalgia: Secondary | ICD-10-CM | POA: Diagnosis not present

## 2016-09-24 NOTE — Patient Instructions (Signed)
  Decompression Exercise: Basic   Lie on back on firm surface, knees bent, feet flat, arms turned up, out to sides, backs of hands down. Time _3-5__ minutes. Surface: bed, with one thin pillow under head.   Shoulder Press - palms facing up and arms slightly away from body.    Press both shoulders down. Hold _3-5__ seconds. Repeat _10__ times. Press one shoulder down. Surface: bed, with one thin pillow    Head Press With [employer] - start with gentle press.    Tuck chin SLIGHTLY toward chest, keep mouth closed. Feel weight on back of head. Increase weight by pressing head down. Hold _3-5__ seconds. Relax. Repeat __10_ times. Surface: bed, with thin pillow under head.   Leg Lengthener: Full   Straighten one leg. Pull toes AND forefoot toward knee, extend heel. Lengthen leg by pulling pelvis away from ribs. Hold ___ seconds. Relax. Repeat 1 time. Re-bend knee. Do other leg. Each leg ___ times. Surface: floor   Leg Press: Single   Straighten one leg down to floor. Bring toes AND forefoot toward knee, extend heel. Press leg down. DO NOT BEND KNEE. Hold ___ seconds. Relax leg. Repeat exercise 1 time. Relax leg. Re-bend knee. Repeat with other leg. Each leg ___ times.  [name] [facility] [phone]

## 2016-09-24 NOTE — Therapy (Signed)
[facility] [place] [address] [place], [place], [address] Phone: [phone]   Fax:  [phone]  Physical Therapy Treatment  Patient Details  Name: Joann Maddox MRN: [id number] Date of Birth: 1940-08-31 Referring Provider: Dr Benjamin Stain  Encounter Date: 09/24/2016      PT End of Session - 09/24/16 1450    Visit Number 13   Number of Visits 20   Date for PT Re-Evaluation 10/19/16   PT Start Time 1450   PT Stop Time 1554   PT Time Calculation (min) 64 min      Past Medical History:  Diagnosis Date  . Anxiety   . Headache(784.0)    migrains  . Heart murmur   . Hypothyroidism   . Rheumatoid arthritis(714.0)     Past Surgical History:  Procedure Laterality Date  . ABDOMINAL HYSTERECTOMY    . APPENDECTOMY    . CHOLECYSTECTOMY    . REPLACEMENT TOTAL KNEE BILATERAL    . TONSILLECTOMY     as  a child  . TOTAL HIP ARTHROPLASTY     right and left    There were no vitals filed for this visit.      Subjective Assessment - 09/24/16 1451    Subjective Joann Maddox reports she isn't doing to well today, thinks all the hot humid weather increases her pain    Patient Stated Goals eliminate the pain in the neck and loosen the shoulder up    Currently in Pain? Yes   Pain Score 8    Pain Location Neck   Pain Orientation Left   Pain Descriptors / Indicators Aching;Dull   Pain Radiating Towards Lt shoulder    Pain Onset More than a month ago   Pain Frequency Constant   Aggravating Factors  weather   Pain Relieving Factors DN for short term                          [facility] Adult PT Treatment/Exercise - 09/24/16 0001      Shoulder Exercises: Supine   Other Supine Exercises semi reclined on mat ~ 30 degrees, MEEKs postural realignment routine. Thin pillow under head      Shoulder Exercises: Pulleys   Flexion 2 minutes   ABduction 2 minutes     Modalities   Modalities Electrical Stimulation;Moist Heat      Moist Heat Therapy   Number Minutes Moist Heat 20 Minutes   Moist Heat Location Cervical;Shoulder     Electrical Stimulation   Electrical Stimulation Location Lt lateral cervical; Lt shoulder    Electrical Stimulation Action IFC   Electrical Stimulation Parameters to tolerance   Electrical Stimulation Goals Pain;Tone     Manual Therapy   Manual therapy comments pt sitting    Soft tissue mobilization Lt posterior shoulder cervical paraspinals STM with TPR          Trigger Point Dry Needling - 09/24/16 1511    Consent Given? Yes   Education Handout Provided No   Muscles Treated Upper Body Longissimus;Levator scapulae;Upper trapezius  Lt side   Upper Trapezius Response Palpable increased muscle length;Twitch reponse elicited   Levator Scapulae Response Palpable increased muscle length;Twitch response elicited  Lt   Longissimus Response Palpable increased muscle length;Twitch response elicited  with stim, C6-T2              PT Education - 09/24/16 1506    Education provided Yes   Education Details MEEKs realignment routine  Person(s) Educated Patient   Methods Explanation;Handout   Comprehension Returned demonstration;Verbalized understanding             PT Long Term Goals - 09/22/16 1107      PT LONG TERM GOAL #1   Title Increase AROM cervical spine by 5-10 degrees in all planes 10/19/16   Time 12   Period Weeks   Status On-going     PT LONG TERM GOAL #2   Title Increase AROM Lt shoudler by 5-15 degrees in all planes 10/19/16   Time 12   Period Weeks   Status On-going     PT LONG TERM GOAL #3   Title Increase strength Lt/Rt shoulders allowing patient to use UE's for more functional activities 10/19/16    Time 12   Period Weeks   Status On-going     PT LONG TERM GOAL #4   Title Decrease frequency, intensity, and/or duration or pain by 25-30% allowing patient to move with greater ease and participate in daily activities 10/19/16   Time 12   Period  Weeks   Status On-going     PT LONG TERM GOAL #5   Title Independent in HEP 10/19/16   Time 12   Period Weeks   Status On-going     PT LONG TERM GOAL #6   Title Improve FOTO to </= 41% limitation 10/19/16   Time 12   Period Weeks   Status On-going               Plan - 09/24/16 1455    Clinical Impression Statement Joann Maddox tolerated treatment well, she demonstrated improved upright posture in standing after performing semi reclined posture re-alignment routine,  I feel she could have performed them supine with pillow for her head.  She verbalized understanding of the importance of improving her posture so she can have longer lasting relief from her pain and reported she would do her exercise.    Rehab Potential Good   Clinical Impairments Affecting Rehab Potential multiple co-morbities    PT Frequency 2x / week   PT Duration 6 weeks   PT Treatment/Interventions Patient/family education;ADLs/Self Care Home Management;Cryotherapy;Electrical Stimulation;Iontophoresis 4mg /ml Dexamethasone;Moist Heat;Ultrasound;Dry needling;Manual techniques;Therapeutic activities;Therapeutic exercise   PT Next Visit Plan assess response and tolerance of MEEKs exercise and progress.    Consulted and Agree with Plan of Care Patient      Patient will benefit from skilled therapeutic intervention in order to improve the following deficits and impairments:  Postural dysfunction, Improper body mechanics, Pain, Increased fascial restricitons, Increased muscle spasms, Decreased range of motion, Decreased mobility, Decreased strength, Impaired UE functional use, Decreased activity tolerance  Visit Diagnosis: Cervicalgia  Chronic left shoulder pain  Other symptoms and signs involving the musculoskeletal system  Weakness generalized     Problem List Patient Active Problem List   Diagnosis Date Noted  . Left cervical radiculopathy 07/20/2016  . 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HCC) 02/12/2015  . Type 2 diabetes mellitus without complication (HCC) 08/10/2014  . Chronic constipation 02/19/2014  . Rosacea 11/06/2013  . GAD (generalized anxiety disorder) 06/08/2013  . Essential hypertension, benign 05/04/2013  . Rheumatoid arthritis (HCC) 05/04/2013  . Migraine with aura 05/04/2013  . Palpitations 05/04/2013  . Murmur, heart 05/04/2013  . Hyperlipidemia 05/04/2013  . Hypothyroidism 05/04/2013    07/02/2013 PT 09/24/2016, 5:32 PM  [facility] [address] [place], [place], [place]  Phone: [phone]   Fax:  [phone]  Name: Joann Maddox MRN: [id number] Date of Birth: [birth year]

## 2016-09-30 ENCOUNTER — Ambulatory Visit (INDEPENDENT_AMBULATORY_CARE_PROVIDER_SITE_OTHER): Payer: Medicare Other | Admitting: Rehabilitative and Restorative Service Providers"

## 2016-09-30 ENCOUNTER — Encounter: Payer: Self-pay | Admitting: Rehabilitative and Restorative Service Providers"

## 2016-09-30 DIAGNOSIS — M542 Cervicalgia: Secondary | ICD-10-CM | POA: Diagnosis not present

## 2016-09-30 DIAGNOSIS — R531 Weakness: Secondary | ICD-10-CM

## 2016-09-30 DIAGNOSIS — M25512 Pain in left shoulder: Secondary | ICD-10-CM

## 2016-09-30 DIAGNOSIS — G8929 Other chronic pain: Secondary | ICD-10-CM | POA: Diagnosis not present

## 2016-09-30 DIAGNOSIS — R29898 Other symptoms and signs involving the musculoskeletal system: Secondary | ICD-10-CM

## 2016-09-30 NOTE — Therapy (Signed)
[facility] [place] [address] [place], [place], [address] Phone: [phone]   Fax:  [phone]  Physical Therapy Treatment  Patient Details  Name: Joann Maddox MRN: [id number] Date of Birth: 05/02/1940 Referring Provider: Dr Benjamin Stain  Encounter Date: 09/30/2016      PT End of Session - 09/30/16 1437    Visit Number 14   Number of Visits 20   Date for PT Re-Evaluation 10/19/16   PT Start Time 1433   PT Stop Time 1531   PT Time Calculation (min) 58 min   Activity Tolerance Patient tolerated treatment well      Past Medical History:  Diagnosis Date  . Anxiety   . Headache(784.0)    migrains  . Heart murmur   . Hypothyroidism   . Rheumatoid arthritis(714.0)     Past Surgical History:  Procedure Laterality Date  . ABDOMINAL HYSTERECTOMY    . APPENDECTOMY    . CHOLECYSTECTOMY    . REPLACEMENT TOTAL KNEE BILATERAL    . TONSILLECTOMY     as  a child  . TOTAL HIP ARTHROPLASTY     right and left    There were no vitals filed for this visit.      Subjective Assessment - 09/30/16 1445    Subjective [name] reports that she had good relief with treatment. She has decreased pain in the neck and shoulder area for the day of treatment and the following day. Then symptoms gradually return. Has worked on the exercises lying in her bed. Can't tell if they help her stand straighter but she is working on them. husband is reminding her to stand straight and tall as well.    Currently in Pain? Yes   Pain Score 8    Pain Location Neck   Pain Orientation Left   Pain Descriptors / Indicators Aching;Dull   Pain Type Chronic pain   Pain Onset More than a month ago   Pain Score 7   Pain Location Shoulder   Pain Orientation Left   Pain Descriptors / Indicators Aching;Tightness   Pain Type Chronic pain   Pain Onset More than a month ago   Pain Frequency Constant                         [facility] Adult PT  Treatment/Exercise - 09/30/16 0001      Shoulder Exercises: Supine   Other Supine Exercises semi reclined on mat ~ 30 degrees, MEEKs postural realignment routine. Thin pillow under head    Other Supine Exercises thoracic lift 10 sec hold  x 5; yellow TB for horizontal abduction; diagonal to each side x 10 each      Shoulder Exercises: Pulleys   Flexion 2 minutes   ABduction 2 minutes     Moist Heat Therapy   Number Minutes Moist Heat 20 Minutes   Moist Heat Location Cervical;Shoulder     Electrical Stimulation   Electrical Stimulation Location Lt lateral cervical; Lt shoulder    Electrical Stimulation Action IFC and estim with DN    Electrical Stimulation Parameters to tolerance    Electrical Stimulation Goals Pain;Tone     Manual Therapy   Manual therapy comments pt sitting    Soft tissue mobilization Lt posterior shoulder cervical paraspinals STM with TPR          Trigger Point Dry Needling - 09/30/16 1500    Consent Given? Yes   Muscles Treated Upper Body --  estim with DN    Scalenes Response Palpable increased muscle length   Upper Trapezius Response Palpable increased muscle length   Oblique Capitus Response Palpable increased muscle length   Longissimus Response Palpable increased muscle length              PT Education - 09/30/16 1458    Education provided Yes   Education Details HEP    Person(s) Educated Patient   Methods Explanation;Demonstration;Tactile cues;Verbal cues;Handout   Comprehension Verbalized understanding;Returned demonstration;Verbal cues required;Tactile cues required             PT Long Term Goals - 09/30/16 1438      PT LONG TERM GOAL #1   Title Increase AROM cervical spine by 5-10 degrees in all planes 10/19/16   Time 12   Period Weeks   Status On-going     PT LONG TERM GOAL #2   Title Increase AROM Lt shoudler by 5-15 degrees in all planes 10/19/16   Time 12   Period Weeks   Status On-going     PT LONG TERM GOAL #3    Title Increase strength Lt/Rt shoulders allowing patient to use UE's for more functional activities 10/19/16    Time 12   Period Weeks   Status On-going     PT LONG TERM GOAL #4   Title Decrease frequency, intensity, and/or duration or pain by 25-30% allowing patient to move with greater ease and participate in daily activities 10/19/16   Time 12   Period Weeks   Status On-going     PT LONG TERM GOAL #5   Title Independent in HEP 10/19/16   Time 12   Period Weeks   Status On-going     PT LONG TERM GOAL #6   Title Improve FOTO to </= 41% limitation 10/19/16   Time 12   Period Weeks   Status On-going               Plan - 09/30/16 1439    Clinical Impression Statement Good response to treatment - doing new home exercises and continues to work on posture and alignment. Excellent response to DN and manual work. She returns to MD in the next few weeks. She has not made an appointment at this time.    Rehab Potential Good   Clinical Impairments Affecting Rehab Potential multiple co-morbities    PT Frequency 2x / week   PT Duration 6 weeks   PT Treatment/Interventions Patient/family education;ADLs/Self Care Home Management;Cryotherapy;Electrical Stimulation;Iontophoresis 4mg /ml Dexamethasone;Moist Heat;Ultrasound;Dry needling;Manual techniques;Therapeutic activities;Therapeutic exercise   PT Next Visit Plan continue MEEKs exercise; postural correction; DN; manual work    Consulted and Agree with Plan of Care Patient      Patient will benefit from skilled therapeutic intervention in order to improve the following deficits and impairments:  Postural dysfunction, Improper body mechanics, Pain, Increased fascial restricitons, Increased muscle spasms, Decreased range of motion, Decreased mobility, Decreased strength, Impaired UE functional use, Decreased activity tolerance  Visit Diagnosis: Cervicalgia  Chronic left shoulder pain  Other symptoms and signs involving the  musculoskeletal system  Weakness generalized     Problem List Patient Active Problem List   Diagnosis Date Noted  . Left cervical radiculopathy 07/20/2016  . 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HCC) 02/12/2015  . Type 2 diabetes mellitus without complication (HCC) 08/10/2014  . Chronic constipation 02/19/2014  . Rosacea 11/06/2013  . GAD (generalized anxiety disorder) 06/08/2013  .  Essential hypertension, benign 05/04/2013  . Rheumatoid arthritis (HCC) 05/04/2013  . Migraine with aura 05/04/2013  . Palpitations 05/04/2013  . Murmur, heart 05/04/2013  . Hyperlipidemia 05/04/2013  . Hypothyroidism 05/04/2013    [name] Rober Minion PT,MPH  09/30/2016, 3:13 PM  [facility] [address] [place], [place], [address] Phone: [phone]   Fax:  [phone]  Name: Joann Maddox MRN: [id number] Date of Birth: [birth year]

## 2016-09-30 NOTE — Patient Instructions (Addendum)
Thoracic Lift    Press shoulders down. Then lift mid-thoracic spine (area between the shoulder blades). Lift the breastbone slightly. Hold ___ seconds. Relax. Repeat ___ times.   On back bring arms out to side with yellow theraband x 10 spreading arms apart  On back bring arms diagonally to one side x 10 then the other x 10 using yellow theraband

## 2016-10-02 ENCOUNTER — Encounter: Payer: Self-pay | Admitting: Rehabilitative and Restorative Service Providers"

## 2016-10-02 ENCOUNTER — Ambulatory Visit (INDEPENDENT_AMBULATORY_CARE_PROVIDER_SITE_OTHER): Payer: Medicare Other | Admitting: Rehabilitative and Restorative Service Providers"

## 2016-10-02 DIAGNOSIS — R531 Weakness: Secondary | ICD-10-CM

## 2016-10-02 DIAGNOSIS — M542 Cervicalgia: Secondary | ICD-10-CM | POA: Diagnosis not present

## 2016-10-02 DIAGNOSIS — G8929 Other chronic pain: Secondary | ICD-10-CM | POA: Diagnosis not present

## 2016-10-02 DIAGNOSIS — R29898 Other symptoms and signs involving the musculoskeletal system: Secondary | ICD-10-CM | POA: Diagnosis not present

## 2016-10-02 DIAGNOSIS — M25512 Pain in left shoulder: Secondary | ICD-10-CM

## 2016-10-02 NOTE — Therapy (Signed)
[facility] [place] [address] [place], [place], [address] Phone: [phone]   Fax:  [phone]  Physical Therapy Treatment  Patient Details  Name: Joann Maddox MRN: [id number] Date of Birth: [birth year] Referring Provider: Dr Benjamin Stain  Encounter Date: 10/02/2016      PT End of Session - 10/02/16 1450    Visit Number 15   Number of Visits 20   Date for PT Re-Evaluation 10/19/16   PT Start Time 1447   PT Stop Time 1549   PT Time Calculation (min) 62 min   Activity Tolerance Patient tolerated treatment well      Past Medical History:  Diagnosis Date  . Anxiety   . Headache(784.0)    migrains  . Heart murmur   . Hypothyroidism   . Rheumatoid arthritis(714.0)     Past Surgical History:  Procedure Laterality Date  . ABDOMINAL HYSTERECTOMY    . APPENDECTOMY    . CHOLECYSTECTOMY    . REPLACEMENT TOTAL KNEE BILATERAL    . TONSILLECTOMY     as  a child  . TOTAL HIP ARTHROPLASTY     right and left    There were no vitals filed for this visit.      Subjective Assessment - 10/02/16 1459    Subjective Mickaila reports that she did try propping a cervical collar under her chin while she slept in her recliner and that seems to help. Florita continues to get good relief from the DN and manual work. She is trying to get a return appointment with MD.    Currently in Pain? Yes   Pain Score 7    Pain Location Neck   Pain Orientation Left   Pain Descriptors / Indicators Aching;Dull   Pain Type Chronic pain   Pain Radiating Towards Lt shoulder    Pain Onset More than a month ago   Pain Frequency Constant   Pain Score 4   Pain Location Shoulder   Pain Orientation Left   Pain Descriptors / Indicators Aching;Tightness   Pain Type Chronic pain   Pain Onset More than a month ago   Pain Frequency Constant                         [facility] Adult PT Treatment/Exercise - 10/02/16 0001      Shoulder Exercises:  Supine   Other Supine Exercises thoracic lift 10 sec hold  x 5; yellow TB for horizontal abduction; diagonal to each side x 10 each      Shoulder Exercises: Seated   Retraction Strengthening;Both;20 reps;Theraband   Theraband Level (Shoulder Retraction) Level 1 (Yellow)   Row Strengthening;Both;20 reps;Theraband   Theraband Level (Shoulder Row) Level 1 (Yellow)   Other Seated Exercises axial extension 15 sec hold x 5    Other Seated Exercises elbows to back pockets x 10; W's      Shoulder Exercises: Standing   Other Standing Exercises hip extension x 10 hands on wall at chest height; x 10 hands on wall just below chest height to engourage hip and trunk extension   Other Standing Exercises scap squeeze with noodle 10 sec x 10; scap retraction with shoulder abductin x 10/diagonals x 10      Shoulder Exercises: Pulleys   Flexion 2 minutes   ABduction 2 minutes     Moist Heat Therapy   Number Minutes Moist Heat 20 Minutes   Moist Heat Location Cervical;Shoulder     Electrical  Stimulation   Electrical Stimulation Location Lt lateral cervical; Lt shoulder    Electrical Stimulation Action IFC and estim    Electrical Stimulation Parameters to tolerance   Electrical Stimulation Goals Pain;Tone     Manual Therapy   Manual therapy comments pt sitting    Soft tissue mobilization Lt posterior shoulder cervical paraspinals STM with TPR          Trigger Point Dry Needling - 10/02/16 1528    Consent Given? Yes   Muscles Treated Upper Body --  Lt with estim    Scalenes Response Palpable increased muscle length   Upper Trapezius Response Palpable increased muscle length   Pectoralis Major Response Palpable increased muscle length   Levator Scapulae Response Palpable increased muscle length   Longissimus Response Palpable increased muscle length                   PT Long Term Goals - 09/30/16 1438      PT LONG TERM GOAL #1   Title Increase AROM cervical spine by 5-10  degrees in all planes 10/19/16   Time 12   Period Weeks   Status On-going     PT LONG TERM GOAL #2   Title Increase AROM Lt shoudler by 5-15 degrees in all planes 10/19/16   Time 12   Period Weeks   Status On-going     PT LONG TERM GOAL #3   Title Increase strength Lt/Rt shoulders allowing patient to use UE's for more functional activities 10/19/16    Time 12   Period Weeks   Status On-going     PT LONG TERM GOAL #4   Title Decrease frequency, intensity, and/or duration or pain by 25-30% allowing patient to move with greater ease and participate in daily activities 10/19/16   Time 12   Period Weeks   Status On-going     PT LONG TERM GOAL #5   Title Independent in HEP 10/19/16   Time 12   Period Weeks   Status On-going     PT LONG TERM GOAL #6   Title Improve FOTO to </= 41% limitation 10/19/16   Time 12   Period Weeks   Status On-going               Plan - 10/02/16 1451    Clinical Impression Statement Modification in sleeping position - using cervical support to prevent head falling forward and to left when she is sleeping in her recliner. Continues to have good response with DN and manual work. Working on getting an appointment with MD for re-check.   Rehab Potential Good   Clinical Impairments Affecting Rehab Potential multiple co-morbities    PT Frequency 2x / week   PT Duration 6 weeks   PT Treatment/Interventions Patient/family education;ADLs/Self Care Home Management;Cryotherapy;Electrical Stimulation;Iontophoresis 4mg /ml Dexamethasone;Moist Heat;Ultrasound;Dry needling;Manual techniques;Therapeutic activities;Therapeutic exercise   PT Next Visit Plan continue MEEKs exercise; postural correction; DN; manual work    Consulted and Agree with Plan of Care Patient      Patient will benefit from skilled therapeutic intervention in order to improve the following deficits and impairments:  Postural dysfunction, Improper body mechanics, Pain, Increased fascial  restricitons, Increased muscle spasms, Decreased range of motion, Decreased mobility, Decreased strength, Impaired UE functional use, Decreased activity tolerance  Visit Diagnosis: Cervicalgia  Chronic left shoulder pain  Other symptoms and signs involving the musculoskeletal system  Weakness generalized     Problem List Patient Active Problem List   Diagnosis Date  Noted  . Left cervical radiculopathy 07/20/2016  . 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HCC) 02/12/2015  . Type 2 diabetes mellitus without complication (HCC) 08/10/2014  . Chronic constipation 02/19/2014  . Rosacea 11/06/2013  . GAD (generalized anxiety disorder) 06/08/2013  . Essential hypertension, benign 05/04/2013  . Rheumatoid arthritis (HCC) 05/04/2013  . Migraine with aura 05/04/2013  . Palpitations 05/04/2013  . Murmur, heart 05/04/2013  . Hyperlipidemia 05/04/2013  . Hypothyroidism 05/04/2013    Artur Winningham Rober Minion PT, MPH  10/02/2016, 3:29 PM  [facility] [address] [place], [place], [address] Phone: [phone]   Fax:  [phone]  Name: KENDRAH LOVERN MRN: [id number] Date of Birth: 12-Jul-1940

## 2016-10-05 ENCOUNTER — Ambulatory Visit (INDEPENDENT_AMBULATORY_CARE_PROVIDER_SITE_OTHER): Payer: Medicare Other | Admitting: Rehabilitative and Restorative Service Providers"

## 2016-10-05 ENCOUNTER — Encounter: Payer: Self-pay | Admitting: Rehabilitative and Restorative Service Providers"

## 2016-10-05 DIAGNOSIS — M542 Cervicalgia: Secondary | ICD-10-CM | POA: Diagnosis not present

## 2016-10-05 DIAGNOSIS — R29898 Other symptoms and signs involving the musculoskeletal system: Secondary | ICD-10-CM

## 2016-10-05 DIAGNOSIS — M25512 Pain in left shoulder: Secondary | ICD-10-CM | POA: Diagnosis not present

## 2016-10-05 DIAGNOSIS — R531 Weakness: Secondary | ICD-10-CM

## 2016-10-05 DIAGNOSIS — G8929 Other chronic pain: Secondary | ICD-10-CM

## 2016-10-05 NOTE — Therapy (Signed)
[place], [place], [address] Phone: [phone]   Fax:  [phone]  Physical Therapy Treatment  Patient Details  Name: Joann Maddox MRN: [id number] Date of Birth: [birth year] Referring Provider: Dr Dianah Field  Encounter Date: 10/05/2016      PT End of Session - 10/05/16 1137    Visit Number 16   Number of Visits 20   Date for PT Re-Evaluation 10/19/16   PT Start Time 1101   PT Stop Time 1204   PT Time Calculation (min) 63 min   Activity Tolerance Patient tolerated treatment well      Past Medical History:  Diagnosis Date  . Anxiety   . Headache(784.0)    migrains  . Heart murmur   . Hypothyroidism   . Rheumatoid arthritis(714.0)     Past Surgical History:  Procedure Laterality Date  . ABDOMINAL HYSTERECTOMY    . APPENDECTOMY    . CHOLECYSTECTOMY    . REPLACEMENT TOTAL KNEE BILATERAL    . TONSILLECTOMY     as  a child  . TOTAL HIP ARTHROPLASTY     right and left    There were no vitals filed for this visit.      Subjective Assessment - 10/05/16 1138    Subjective Joann Maddox reports that she did try propping a cervical collar under her chin while she slept in her recliner and that seems to help. Joann Maddox continues to get good relief from the DN and manual work. She is trying to get a return appointment with MD.    Currently in Pain? Yes   Pain Score 6    Pain Location Neck   Pain Orientation Left   Pain Descriptors / Indicators Aching;Dull   Pain Type Chronic pain   Pain Radiating Towards into Lt shoulder    Pain Onset More than a month ago   Pain Frequency Constant   Aggravating Factors  weather; end of day   Pain Relieving Factors DN - massage; heat    Multiple Pain Sites Yes   Pain Score 7   Pain Location Shoulder   Pain Orientation Left   Pain Descriptors / Indicators Aching;Tightness   Pain Type Chronic pain   Pain Onset More than a month ago   Pain Frequency Constant    Aggravating Factors  reaching; bending forward for anything; certain movements using Lt UE    Pain Relieving Factors DN; TENS; ice; heat; meds             [facility] PT Assessment - 10/05/16 0001      Assessment   Medical Diagnosis Lt adhesive capsulitis; cervical dysfunction    Referring Provider Dr Dianah Field   Onset Date/Surgical Date 03/11/16   Hand Dominance Right   Prior Therapy none for neck and shoulder      AROM   Right Shoulder Extension 70 Degrees   Right Shoulder Flexion 148 Degrees   Right Shoulder ABduction 135 Degrees   Right Shoulder Internal Rotation 60 Degrees   Right Shoulder External Rotation 82 Degrees   Left Shoulder Extension 53 Degrees   Left Shoulder Flexion 114 Degrees   Left Shoulder ABduction 103 Degrees   Left Shoulder Internal Rotation 60 Degrees   Left Shoulder External Rotation 61 Degrees   Cervical Flexion 62   Cervical Extension 32   Cervical - Right Side Bend 35   Cervical - Left Side Bend 31   Cervical - Right Rotation 61   Cervical -  Left Rotation 37     Strength   Overall Strength --  improved scapular retraction    Right Shoulder Flexion --  5-/5   Right Shoulder Extension 5/5   Right Shoulder ABduction --  5-/5   Right Shoulder Internal Rotation 4+/5   Right Shoulder External Rotation 4+/5   Left Shoulder Flexion 4-/5   Left Shoulder Extension 4+/5   Left Shoulder ABduction 4-/5   Left Shoulder Internal Rotation 4/5   Left Shoulder External Rotation 4-/5     Palpation   Palpation comment muscular tightness Lt > Rt upper quarter including upper traps; leveator; pecs; ant/lat/posterior cervical musculature; anterior deltoid; biceps                      [facility] Adult PT Treatment/Exercise - 10/05/16 0001      Shoulder Exercises: Supine   Other Supine Exercises thoracic lift 10 sec hold  x 5; yellow TB for horizontal abduction; diagonal to each side x 10 each      Shoulder Exercises: Seated   Retraction  Strengthening;Both;20 reps;Theraband   Theraband Level (Shoulder Retraction) Level 1 (Yellow)   Row Strengthening;Both;20 reps;Theraband   Theraband Level (Shoulder Row) Level 1 (Yellow)   Other Seated Exercises axial extension 15 sec hold x 5    Other Seated Exercises elbows to back pockets x 10; W's      Shoulder Exercises: Standing   Other Standing Exercises hip extension x 10 hands on wall at chest height; x 10 hands on wall just below chest height to engourage hip and trunk extension   Other Standing Exercises scap squeeze with noodle 10 sec x 10; scap retraction with shoulder abductin x 10/diagonals x 10      Shoulder Exercises: Pulleys   Flexion 2 minutes   ABduction 2 minutes     Moist Heat Therapy   Number Minutes Moist Heat 20 Minutes   Moist Heat Location Cervical;Shoulder     Electrical Stimulation   Electrical Stimulation Location Lt lateral cervical; Lt shoulder    Electrical Stimulation Action IFC and estim with DN    Electrical Stimulation Parameters to tolerance   Electrical Stimulation Goals Pain;Tone     Manual Therapy   Manual therapy comments pt sitting    Soft tissue mobilization Lt posterior shoulder cervical paraspinals STM with TPR          Trigger Point Dry Needling - 10/05/16 1136    Consent Given? Yes   Muscles Treated Upper Body --  Lt with estim   Scalenes Response Palpable increased muscle length   Upper Trapezius Response Palpable increased muscle length   Pectoralis Major Response Palpable increased muscle length   Levator Scapulae Response Palpable increased muscle length   Longissimus Response Palpable increased muscle length                   PT Long Term Goals - 10/05/16 1137      PT LONG TERM GOAL #1   Title Increase AROM cervical spine by 5-10 degrees in all planes 10/19/16   Time 12   Period Weeks   Status Partially Met     PT LONG TERM GOAL #2   Title Increase AROM Lt shoudler by 5-15 degrees in all planes  10/19/16   Time 12   Period Weeks   Status Partially Met     PT LONG TERM GOAL #3   Title Increase strength Lt/Rt shoulders allowing patient to use UE's for  more functional activities 10/19/16    Time 12   Period Weeks   Status On-going     PT LONG TERM GOAL #4   Title Decrease frequency, intensity, and/or duration or pain by 25-30% allowing patient to move with greater ease and participate in daily activities 10/19/16   Time 12   Period Weeks   Status On-going     PT LONG TERM GOAL #5   Title Independent in HEP 10/19/16   Time 12   Period Weeks   Status On-going     PT LONG TERM GOAL #6   Title Improve FOTO to </= 41% limitation 10/19/16   Time 12   Period Weeks   Status On-going               Plan - 10/05/16 1156    Clinical Impression Statement Modification in sleeping position has helped decrease tightness and pain in the am. She is using cervical support while she sleeps sitting in her recliner. Continues to have postural weakness and poor posture and alignment as well as muscular tightness through upper body; pain limiting functional activity. Joann Maddox responds well to dry needling but improvement is temporary. May benefit from trigger point injections since she responds so well to dry needling and symptoms seem muscular in nature. Joann Maddox is nearing her medicare allowable for Physical Therapy for the calendar year. Scheduled for appt with Dr Dianah Field 10/07/16.    Rehab Potential Good   Clinical Impairments Affecting Rehab Potential multiple co-morbities    PT Frequency 2x / week   PT Duration 6 weeks   PT Treatment/Interventions Patient/family education;ADLs/Self Care Home Management;Cryotherapy;Electrical Stimulation;Iontophoresis 77m/ml Dexamethasone;Moist Heat;Ultrasound;Dry needling;Manual techniques;Therapeutic activities;Therapeutic exercise   PT Next Visit Plan continue Joann Maddox exercise; postural correction; DN; manual work    Consulted and Agree with Plan of Care  Patient      Patient will benefit from skilled therapeutic intervention in order to improve the following deficits and impairments:  Postural dysfunction, Improper body mechanics, Pain, Increased fascial restricitons, Increased muscle spasms, Decreased range of motion, Decreased mobility, Decreased strength, Impaired UE functional use, Decreased activity tolerance  Visit Diagnosis: Cervicalgia  Chronic left shoulder pain  Other symptoms and signs involving the musculoskeletal system  Weakness generalized     Problem List Patient Active Problem List   Diagnosis Date Noted  . Left cervical radiculopathy 07/20/2016  . 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[facility] 02/12/2015  . Type 2 diabetes mellitus without complication ([facility] 073/66/8159 . Chronic constipation 02/19/2014  . Rosacea 11/06/2013  . GAD (generalized anxiety disorder) 06/08/2013  . Essential hypertension, benign 05/04/2013  . Rheumatoid arthritis ([facility] 05/04/2013  . Migraine with aura 05/04/2013  . Palpitations 05/04/2013  . Murmur, heart 05/04/2013  . Hyperlipidemia 05/04/2013  . Hypothyroidism 05/04/2013    Joann Maddox PNilda SimmerPT, MPH  10/05/2016, 1:01 PM  [facility] [place] [address]Phone: [phone]  Fax:  [phone] Name: Joann Maddox: [id number] of Birth: [birth year]

## 2016-10-06 ENCOUNTER — Other Ambulatory Visit: Payer: Self-pay | Admitting: Family Medicine

## 2016-10-07 ENCOUNTER — Ambulatory Visit: Payer: Medicare Other | Admitting: Sports Medicine

## 2016-10-08 ENCOUNTER — Ambulatory Visit (INDEPENDENT_AMBULATORY_CARE_PROVIDER_SITE_OTHER): Payer: Medicare Other | Admitting: Sports Medicine

## 2016-10-08 ENCOUNTER — Encounter: Payer: Medicare Other | Admitting: Rehabilitative and Restorative Service Providers"

## 2016-10-08 ENCOUNTER — Encounter: Payer: Self-pay | Admitting: Family Medicine

## 2016-10-08 ENCOUNTER — Ambulatory Visit (INDEPENDENT_AMBULATORY_CARE_PROVIDER_SITE_OTHER): Payer: Medicare Other | Admitting: Family Medicine

## 2016-10-08 ENCOUNTER — Encounter: Payer: Self-pay | Admitting: Sports Medicine

## 2016-10-08 VITALS — BP 117/56 | HR 84 | Ht 59.74 in | Wt 213.0 lb

## 2016-10-08 DIAGNOSIS — M791 Myalgia, unspecified site: Secondary | ICD-10-CM

## 2016-10-08 DIAGNOSIS — M7502 Adhesive capsulitis of left shoulder: Secondary | ICD-10-CM

## 2016-10-08 DIAGNOSIS — M5412 Radiculopathy, cervical region: Secondary | ICD-10-CM

## 2016-10-08 DIAGNOSIS — K5909 Other constipation: Secondary | ICD-10-CM

## 2016-10-08 NOTE — Progress Notes (Signed)
Subjective:    Patient ID: Joann Maddox, female    DOB: 03-Oct-1940, 76 y.o.   MRN: [id number]  HPI 76 year old female comes in today complaining of constipation. She says for almost 3 weeks she is barely had a bowel movement. She did have a fairly normal one last night and has been passing tiny amounts in between but this is very unlike her. She does have a history of chronic constipation but says she takes a Senokot every night and normally that keeps her moving. She's tried increasing her water and increasing her vegetable intake. But she is just not getting any improvement or relief. She is getting really uncomfortable and starting to feel bad. No vomiting. No specific abdominal pain. No nausea. No blood in the stool. She did try some rectal suppositories but had a hard time getting them in.   Review of Systems   BP (!) 117/56   Pulse 84   Ht 4' 11.74" (1.517 m)   Wt 213 lb (96.6 kg)   SpO2 98%   BMI 41.96 kg/m     Allergies  Allergen Reactions  . Iodine Other (See Comments)    Shook violently and passed out.  . Codeine Other (See Comments)    Hallucinations  . Lipitor [Atorvastatin] Other (See Comments)    Myalgias    Past Medical History:  Diagnosis Date  . Anxiety   . Headache(784.0)    migrains  . Heart murmur   . Hypothyroidism   . Rheumatoid arthritis(714.0)     Past Surgical History:  Procedure Laterality Date  . ABDOMINAL HYSTERECTOMY    . APPENDECTOMY    . CHOLECYSTECTOMY    . REPLACEMENT TOTAL KNEE BILATERAL    . TONSILLECTOMY     as  a child  . TOTAL HIP ARTHROPLASTY     right and left    Social History   Social History  . Marital status: Married    Spouse name: Dorene Sorrow  . Number of children: 2  . Years of education: N/A   Occupational History  . Retired Emergency planning/management officer   Social History Main Topics  . Smoking status: Never Smoker  . Smokeless tobacco: Never Used  . Alcohol use No  . Drug use: Unknown  . Sexual activity:  Not on file   Other Topics Concern  . Not on file   Social History Narrative   No regular exercise. 2 caffeine drinks per day.     Family History  Problem Relation Age of Onset  . Heart disease Father 39  . Diabetes Unknown        aunt   . Stroke Mother 46    Outpatient Encounter Prescriptions as of 10/08/2016  Medication Sig  . azelaic acid (AZELEX) 20 % cream Apply topically 2 (two) times daily. After skin is thoroughly washed and patted dry, gently but thoroughly massage a thin film of azelaic acid cream into the affected area twice daily, in the morning and evening.  . butalbital-acetaminophen-caffeine (FIORICET, ESGIC) 50-325-40 MG tablet TAKE 1 TABLET BY MOUTH TWICE A DAY AS NEEDED  . DULoxetine (CYMBALTA) 60 MG capsule TAKE 1 CAPSULE (60 MG TOTAL) BY MOUTH DAILY.  . folic acid (FOLVITE) 1 MG tablet TAKE 1 TABLET BY MOUTH EVERY DAY  . furosemide (LASIX) 20 MG tablet TAKE 1 TABLET (20 MG TOTAL) BY MOUTH DAILY AS NEEDED.  . hydroxychloroquine (PLAQUENIL) 200 MG tablet Take 200 mg by mouth daily.  Marland Kitchen  KLOR-CON M10 10 MEQ tablet TAKE 1 TABLET (10 MEQ TOTAL) BY MOUTH 2 (TWO) TIMES DAILY.  Marland Kitchen levothyroxine (SYNTHROID, LEVOTHROID) 25 MCG tablet TAKE 1 TABLET (25 MCG TOTAL) BY MOUTH DAILY.  Marland Kitchen lisinopril-hydrochlorothiazide (PRINZIDE,ZESTORETIC) 20-25 MG tablet TAKE 1 TABLET BY MOUTH DAILY.  . meloxicam (MOBIC) 15 MG tablet One tab PO qAM with breakfast for 2 weeks, then daily prn pain.  . metFORMIN (GLUCOPHAGE) 500 MG tablet TAKE 1 TABLET BY MOUTH 2 TIMES DAILY WITH A MEAL.  . methotrexate 50 MG/2ML injection 1 ML WEEKLY INJECTION 30 DAYS  . Multiple Vitamin (MULTIVITAMIN) tablet Take 1 tablet by mouth daily.  Marland Kitchen MYRBETRIQ 25 MG TB24 tablet TAKE 1 TABLET BY MOUTH DAILY.  Marland Kitchen nystatin (NYSTATIN) powder Apply 1 g topically 2 (two) times daily. X 3 weeks.  . rosuvastatin (CRESTOR) 10 MG tablet Take 1 tablet (10 mg total) by mouth daily.  Marland Kitchen senna (SENOKOT) 8.6 MG tablet Take 1 tablet by mouth  as needed.   . traMADol (ULTRAM) 50 MG tablet TAKE 1 TO 2 TABLET BY MOUTH EVERY 12 HOURS AS NEEDED FOR PAIN.  Marland Kitchen vitamin B-12 (CYANOCOBALAMIN) 100 MCG tablet Take 50 mcg by mouth daily.  . Vitamin D, Ergocalciferol, (DRISDOL) 50000 units CAPS capsule TAKE 1 CAPSULE (50,000 UNITS TOTAL) BY MOUTH ONCE A WEEK.   No facility-administered encounter medications on file as of 10/08/2016.           Objective:   Physical Exam  Constitutional: She is oriented to person, place, and time. She appears well-developed and well-nourished.  HENT:  Head: Normocephalic and atraumatic.  Cardiovascular: Normal rate, regular rhythm and normal heart sounds.   Pulmonary/Chest: Effort normal and breath sounds normal.  Abdominal: Soft. She exhibits no distension and no mass. There is no tenderness. There is no rebound and no guarding.  +bloating  Neurological: She is alert and oriented to person, place, and time.  Skin: Skin is warm and dry.  Psychiatric: She has a normal mood and affect. Her behavior is normal.        Assessment & Plan:  Constipation, acute on chronic-recommend a trial of MiraLAX.  Start with 1 capful of MiraLAX twice a day. After the bowel movements become soft or runny been decreased down to once a day. Okay to take the Senokot with this. After the go down to once a day for a few days then decrease down to half a capful for a few more days to try to get the bowels completely cleared out.  If not improving by Monday of next week and please give a call back and we can consider trying magnesium citrate.

## 2016-10-08 NOTE — Assessment & Plan Note (Signed)
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 She is improved more with physical therapy then she has with epidurals in the past. Has not plateaued, continue PT. #3 trigger point injections given today. Return in one month. Because she did not have a good response to a previous epidural by another provider, it is possible that we would need to target her facet joints.

## 2016-10-08 NOTE — Progress Notes (Signed)
  Subjective:    CC: follow-up  HPI: Joann Maddox is a 76 y.o. Female with a history of left cervical radiculits and left adhesive capsulitis who presents for follow-up.  Patient has been doing formal physical therapy for the last month, and says both neck and shoulder pain is continuing to improve. Left arm radiation has essentially resolved. However, patient reports that physical therapy only provides temporary relief a few hours after each session. She does do home rehabilitation exercises as well. Pt has been taking meloxicam and tramadol for pain, which does seem to help. Pt has had epidural injections about a year ago, but did not get much relief from them, and would like to discuss pressure point injections.  Past medical history:  Negative.  See flowsheet/record as well for more information.  Surgical history: Negative.  See flowsheet/record as well for more information.  Family history: Negative.  See flowsheet/record as well for more information.  Social history: Negative.  See flowsheet/record as well for more information.  Allergies, and medications have been entered into the medical record, reviewed, and no changes needed.   Review of Systems: No fevers, chills, night sweats, weight loss, chest pain, or shortness of breath.   Objective:    General: Well Developed, well nourished, and in no acute distress.  Neuro: Alert and oriented x3, extra-ocular muscles intact, sensation grossly intact.  HEENT: Normocephalic, atraumatic, pupils equal round reactive to light, neck supple, no masses, no lymphadenopathy, thyroid nonpalpable.  Skin: Warm and dry, no rashes. Cardiac: Regular rate and rhythm, no murmurs rubs or gallops, no lower extremity edema.  Respiratory: Clear to auscultation bilaterally. Not using accessory muscles, speaking in full sentences. MSK: TTP left paracervical muscles and left trapezius. ROM decreased to 140-150 degrees left shoulder flexion and abduction, right  shoulder to 160 degrees left shoulder flexion and abduction. Shoulder strength 5/5 abduction, 5/5 addcution, 5/5 internal rotation, 4/5 external rotation. Passive left shoulder external rotation diminished to 30 degrees with solid endpoint. Neck ROM: normal neck flexion, reduced neck extension with painful endpoint, painful and reduced ROM left worse than right neck turn, painful and reduced left neck bend, normal right neck bend; Normal sensation in cervical distribution; Biceps, triceps reflexes 1+ bilaterally.   Impression and Recommendations:    Joann Maddox is a 76 y.o. Female with a history of left cervical radiculopathy and adhesive capsulitis of left shoulder who presents for follow-up.  Both problems are continuing to improve with formal physical therapy, home rehabilitation exercises. Pt was advised to continue home rehab if her coverage for formal PT runs out. As she has not plateaued, we will continue as before with formal PT, home rehab, meloxicam, tramadol.  Pt would like to try trigger point injections before proceeding with facet or epidural injections. As she reports that she did not get much improvement with epidurals in the past, facet injection may prove a more effective target. Procedure was done elsewhere.  Follow-up in one month.

## 2016-10-08 NOTE — Assessment & Plan Note (Signed)
Good improvement with injection to a half months ago. Continues to improve but slowly with physical therapy, continue this.

## 2016-10-08 NOTE — Patient Instructions (Signed)
Start with 1 capful of MiraLAX twice a day. After the bowel movements become soft or runny been decreased down to once a day. Okay to take the Senokot with this. After the go down to once a day for a few days then decrease down to half a capful for a few more days to try to get the bowels completely cleared out. If not improving by Monday of next week and please give a call back and we can consider trying magnesium citrate.

## 2016-10-09 ENCOUNTER — Other Ambulatory Visit: Payer: Self-pay | Admitting: Family Medicine

## 2016-10-12 ENCOUNTER — Encounter: Payer: Medicare Other | Admitting: Rehabilitative and Restorative Service Providers"

## 2016-10-14 ENCOUNTER — Ambulatory Visit (INDEPENDENT_AMBULATORY_CARE_PROVIDER_SITE_OTHER): Payer: Medicare Other | Admitting: Rehabilitative and Restorative Service Providers"

## 2016-10-14 ENCOUNTER — Encounter: Payer: Self-pay | Admitting: Rehabilitative and Restorative Service Providers"

## 2016-10-14 DIAGNOSIS — M25512 Pain in left shoulder: Secondary | ICD-10-CM | POA: Diagnosis not present

## 2016-10-14 DIAGNOSIS — G8929 Other chronic pain: Secondary | ICD-10-CM | POA: Diagnosis not present

## 2016-10-14 DIAGNOSIS — M542 Cervicalgia: Secondary | ICD-10-CM

## 2016-10-14 DIAGNOSIS — R531 Weakness: Secondary | ICD-10-CM | POA: Diagnosis not present

## 2016-10-14 DIAGNOSIS — R29898 Other symptoms and signs involving the musculoskeletal system: Secondary | ICD-10-CM

## 2016-10-14 NOTE — Therapy (Signed)
[place], [place], [address] Phone: [phone]   Fax:  [phone]  Physical Therapy Treatment  Patient Details  Name: Joann Maddox MRN: [id number] Date of Birth: Dec 30, 1940 Referring Provider: Dr Dianah Field  Encounter Date: 10/14/2016      PT End of Session - 10/14/16 1420    Visit Number 17   Number of Visits 20   Date for PT Re-Evaluation 11/11/16   PT Start Time 9024   PT Stop Time 1529   PT Time Calculation (min) 61 min   Activity Tolerance Patient tolerated treatment well      Past Medical History:  Diagnosis Date  . Anxiety   . Headache(784.0)    migrains  . Heart murmur   . Hypothyroidism   . Rheumatoid arthritis(714.0)     Past Surgical History:  Procedure Laterality Date  . ABDOMINAL HYSTERECTOMY    . APPENDECTOMY    . CHOLECYSTECTOMY    . REPLACEMENT TOTAL KNEE BILATERAL    . TONSILLECTOMY     as  a child  . TOTAL HIP ARTHROPLASTY     right and left    There were no vitals filed for this visit.      Subjective Assessment - 10/14/16 1431    Subjective Joann Maddox reports that she had a headache the evening she had the trigger point injections when she saw Dr Dianah Field. She has had a headache over the weekend and felt 'terrible". She is feeling better today with less tightness and pain in the neck and shoulder. She does feel she has improved since beginning therapy. She has less pain and is better able to rest and lso to use Lt UE for more functional activities.    Currently in Pain? Yes   Pain Score 5    Pain Location Neck   Pain Orientation Left   Pain Descriptors / Indicators Aching;Dull   Pain Type Chronic pain   Pain Onset More than a month ago   Pain Frequency Constant   Pain Score 5   Pain Location Shoulder   Pain Orientation Left   Pain Descriptors / Indicators Aching;Tightness   Pain Type Chronic pain   Pain Frequency Constant                          [facility] Adult PT Treatment/Exercise - 10/14/16 0001      Shoulder Exercises: Seated   Retraction Strengthening;Both;20 reps;Theraband   Theraband Level (Shoulder Retraction) Level 1 (Yellow)   Row Strengthening;Both;20 reps;Theraband   Theraband Level (Shoulder Row) Level 1 (Yellow)   Other Seated Exercises axial extension 15 sec hold x 5    Other Seated Exercises elbows to back pockets x 10; W's; shoulder rolls posteriorly x 10      Shoulder Exercises: Standing   Other Standing Exercises hip extension x 10 hands on wall at chest height; x 10 hands on wall just below chest height to engourage hip and trunk extension   Other Standing Exercises scap squeeze with noodle 10 sec x 10; scap retraction with shoulder abductin x 10/diagonals x 10      Shoulder Exercises: Pulleys   Flexion 2 minutes   ABduction 2 minutes     Moist Heat Therapy   Number Minutes Moist Heat 20 Minutes   Moist Heat Location Cervical;Shoulder     Electrical Stimulation   Electrical Stimulation Location Lt lateral cervical; Lt shoulder    Electrical Stimulation  Action IFC and estim with DN   Electrical Stimulation Parameters to tolerance   Electrical Stimulation Goals Pain;Tone     Manual Therapy   Manual therapy comments pt sitting    Soft tissue mobilization Lt posterior shoulder cervical paraspinals STM with TPR          Trigger Point Dry Needling - 10/14/16 1509    Consent Given? Yes   Muscles Treated Upper Body --  Lt side with estim with DN    Upper Trapezius Response Palpable increased muscle length   Pectoralis Major Response Palpable increased muscle length   Levator Scapulae Response Palpable increased muscle length   Longissimus Response Palpable increased muscle length  cervical and upper thoracic                    PT Long Term Goals - 10/14/16 1421      PT LONG TERM GOAL #1   Title Increase AROM cervical spine by 5-10 degrees in all  planes 11/11/16   Time 16   Period Weeks   Status Partially Met     PT LONG TERM GOAL #2   Title Increase AROM Lt shoudler by 5-15 degrees in all planes 11/11/16   Time 16   Period Weeks   Status Partially Met     PT LONG TERM GOAL #3   Title Increase strength Lt/Rt shoulders allowing patient to use UE's for more functional activities 12/12/16    Time 16   Period Weeks   Status On-going     PT LONG TERM GOAL #4   Title Decrease frequency, intensity, and/or duration or pain by 25-30% allowing patient to move with greater ease and participate in daily activities 11/11/16   Time 16   Period Weeks   Status On-going     PT LONG TERM GOAL #5   Title Independent in HEP 11/11/16   Time 16   Period Weeks   Status On-going     PT LONG TERM GOAL #6   Title Improve FOTO to </= 41% limitation 11/11/16   Time 16   Period Weeks   Status On-going               Plan - 10/14/16 1439    Clinical Impression Statement Some gradual progress noted with increased shoulder and cervical ROM and some decrease in pain on a temporary basis. Joann Maddox received trigger point injections at MD visit last week with good response - decresaed tightness to palpation and decreased pain reported. She has partially accomplished goals. We will decresae frequency of visits for the last few Medicare visits, allowing tile to assess response to rigger point injections.    Rehab Potential Good   PT Frequency 2x / week   PT Duration 6 weeks   PT Treatment/Interventions Patient/family education;ADLs/Self Care Home Management;Cryotherapy;Electrical Stimulation;Iontophoresis 18m/ml Dexamethasone;Moist Heat;Ultrasound;Dry needling;Manual techniques;Therapeutic activities;Therapeutic exercise   PT Next Visit Plan continue postural correction; DN; manual work    Consulted and Agree with Plan of Care Patient      Patient will benefit from skilled therapeutic intervention in order to improve the following deficits and  impairments:  Postural dysfunction, Improper body mechanics, Pain, Increased fascial restricitons, Increased muscle spasms, Decreased range of motion, Decreased mobility, Decreased strength, Impaired UE functional use, Decreased activity tolerance  Visit Diagnosis: Cervicalgia - Plan: PT plan of care cert/re-cert  Chronic left shoulder pain - Plan: PT plan of care cert/re-cert  Other symptoms and signs involving the musculoskeletal system -  Plan: PT plan of care cert/re-cert  Weakness generalized - Plan: PT plan of care cert/re-cert     Problem List Patient Active Problem List   Diagnosis Date Noted  . Left cervical radiculopathy 07/20/2016  . 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[place]) 02/12/2015  . Type 2 diabetes mellitus without complication ([place]) 80/09/3492  . Chronic constipation 02/19/2014  . Rosacea 11/06/2013  . GAD (generalized anxiety disorder) 06/08/2013  . Essential hypertension, benign 05/04/2013  . Rheumatoid arthritis ([place]) 05/04/2013  . Migraine with aura 05/04/2013  . Palpitations 05/04/2013  . Murmur, heart 05/04/2013  . Hyperlipidemia 05/04/2013  . Hypothyroidism 05/04/2013    Meshach Perry Nilda Simmer PT, MPH  10/14/2016, 3:13 PM  [facility] [place], [place], [address] Phone: [phone]   Fax:  [phone]  Name: Joann Maddox MRN: [id number] Date of Birth: [birth year]

## 2016-10-21 ENCOUNTER — Encounter: Payer: Self-pay | Admitting: Rehabilitative and Restorative Service Providers"

## 2016-10-21 ENCOUNTER — Ambulatory Visit (INDEPENDENT_AMBULATORY_CARE_PROVIDER_SITE_OTHER): Payer: Medicare Other | Admitting: Rehabilitative and Restorative Service Providers"

## 2016-10-21 DIAGNOSIS — R531 Weakness: Secondary | ICD-10-CM | POA: Diagnosis not present

## 2016-10-21 DIAGNOSIS — M25512 Pain in left shoulder: Secondary | ICD-10-CM | POA: Diagnosis not present

## 2016-10-21 DIAGNOSIS — M542 Cervicalgia: Secondary | ICD-10-CM

## 2016-10-21 DIAGNOSIS — R29898 Other symptoms and signs involving the musculoskeletal system: Secondary | ICD-10-CM | POA: Diagnosis not present

## 2016-10-21 DIAGNOSIS — G8929 Other chronic pain: Secondary | ICD-10-CM

## 2016-10-21 NOTE — Therapy (Signed)
[place], [place], [address] Phone: [phone]   Fax:  [phone]  Physical Therapy Treatment  Patient Details  Name: Joann Maddox MRN: [id number] Date of Birth: [birth year] Referring Provider: Dr Dianah Field   Encounter Date: 10/21/2016      PT End of Session - 10/21/16 1452    Visit Number 18   Number of Visits 20   Date for PT Re-Evaluation 11/11/16   PT Start Time 8592   PT Stop Time 1539   PT Time Calculation (min) 56 min   Activity Tolerance Patient tolerated treatment well      Past Medical History:  Diagnosis Date  . Anxiety   . Headache(784.0)    migrains  . Heart murmur   . Hypothyroidism   . Rheumatoid arthritis(714.0)     Past Surgical History:  Procedure Laterality Date  . ABDOMINAL HYSTERECTOMY    . APPENDECTOMY    . CHOLECYSTECTOMY    . REPLACEMENT TOTAL KNEE BILATERAL    . TONSILLECTOMY     as  a child  . TOTAL HIP ARTHROPLASTY     right and left    There were no vitals filed for this visit.          [facility] PT Assessment - 10/21/16 0001      Assessment   Medical Diagnosis Lt adhesive capsulitis; cervical dysfunction    Referring Provider Dr Dianah Field    Onset Date/Surgical Date 03/11/16   Hand Dominance Right   Prior Therapy none for neck and shoulder      AROM   Cervical Flexion 62   Cervical Extension 48   Cervical - Right Side Bend 38   Cervical - Left Side Bend 34   Cervical - Right Rotation 63   Cervical - Left Rotation 40                     [facility] Adult PT Treatment/Exercise - 10/21/16 0001      Shoulder Exercises: Standing   Other Standing Exercises hip extension x 10 hands on wall at chest height; x 10 hands on wall just below chest height to engourage hip and trunk extension   Other Standing Exercises scap squeeze with noodle 10 sec x 10; scap retraction with shoulder abductin x 10/diagonals x 10      Shoulder  Exercises: Pulleys   Flexion 2 minutes   ABduction 2 minutes     Moist Heat Therapy   Number Minutes Moist Heat 20 Minutes   Moist Heat Location Cervical;Shoulder     Electrical Stimulation   Electrical Stimulation Location Lt lateral cervical; Lt shoulder    Electrical Stimulation Action IFC   Electrical Stimulation Parameters to tolerance   Electrical Stimulation Goals Pain;Tone     Manual Therapy   Manual therapy comments pt sitting    Soft tissue mobilization Lt posterior shoulder cervical paraspinals STM with TPR          Trigger Point Dry Needling - 10/21/16 1514    Consent Given? Yes   Muscles Treated Upper Body --  Lt with estim for cervical and upper trap musculature    Upper Trapezius Response Palpable increased muscle length   Levator Scapulae Response Palpable increased muscle length   Longissimus Response Palpable increased muscle length                   PT Long Term Goals - 10/21/16 1512  PT LONG TERM GOAL #1   Title Increase AROM cervical spine by 5-10 degrees in all planes 11/11/16   Time 16   Period Weeks   Status Partially Met     PT LONG TERM GOAL #2   Title Increase AROM Lt shoudler by 5-15 degrees in all planes 11/11/16   Time 16   Period Weeks   Status Partially Met     PT LONG TERM GOAL #3   Title Increase strength Lt/Rt shoulders allowing patient to use UE's for more functional activities 12/12/16    Time 16   Period Weeks   Status On-going     PT LONG TERM GOAL #4   Title Decrease frequency, intensity, and/or duration or pain by 25-30% allowing patient to move with greater ease and participate in daily activities 11/11/16   Time 16   Period Weeks   Status On-going     PT LONG TERM GOAL #5   Title Independent in HEP 11/11/16   Time 16   Period Weeks   Status On-going     PT LONG TERM GOAL #6   Title Improve FOTO to </= 41% limitation 11/11/16   Time 16   Period Weeks   Status On-going               Plan -  10/21/16 1511    Clinical Impression Statement Less palpable tightness noted throuhg the cervical musculature. Increased AROM cervical spine. Good response to trigger point insection and DN. Progressing well toward stated goals of therapy.    Rehab Potential Good   PT Frequency 2x / week   PT Treatment/Interventions Patient/family education;ADLs/Self Care Home Management;Cryotherapy;Electrical Stimulation;Iontophoresis 54m/ml Dexamethasone;Moist Heat;Ultrasound;Dry needling;Manual techniques;Therapeutic activities;Therapeutic exercise   PT Next Visit Plan continue postural correction; DN; manual work    Consulted and Agree with Plan of Care Patient      Patient will benefit from skilled therapeutic intervention in order to improve the following deficits and impairments:  Postural dysfunction, Improper body mechanics, Pain, Increased fascial restricitons, Increased muscle spasms, Decreased range of motion, Decreased mobility, Decreased strength, Impaired UE functional use, Decreased activity tolerance  Visit Diagnosis: Cervicalgia  Chronic left shoulder pain  Other symptoms and signs involving the musculoskeletal system  Weakness generalized     Problem List Patient Active Problem List   Diagnosis Date Noted  . Left cervical radiculopathy 07/20/2016  . 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[place] 02/12/2015  . Type 2 diabetes mellitus without complication ([facility] 036/68/1594 . Chronic constipation 02/19/2014  . Rosacea 11/06/2013  . GAD (generalized anxiety disorder) 06/08/2013  . Essential hypertension, benign 05/04/2013  . Rheumatoid arthritis ([facility] 05/04/2013  . Migraine with aura 05/04/2013  . Palpitations 05/04/2013  . Murmur, heart 05/04/2013  . Hyperlipidemia 05/04/2013  . Hypothyroidism 05/04/2013    Grainne Knights PNilda SimmerPT, MPH  10/21/2016, 3:15 PM  [facility] [address]Phone: [phone]  Fax:  [phone] Name: Joann MARKETMRN: [id number] of Birth: 3Mar 13, 1942

## 2016-10-27 ENCOUNTER — Other Ambulatory Visit: Payer: Self-pay | Admitting: Family Medicine

## 2016-10-29 ENCOUNTER — Ambulatory Visit (INDEPENDENT_AMBULATORY_CARE_PROVIDER_SITE_OTHER): Payer: Medicare Other | Admitting: Rehabilitative and Restorative Service Providers"

## 2016-10-29 DIAGNOSIS — R29898 Other symptoms and signs involving the musculoskeletal system: Secondary | ICD-10-CM

## 2016-10-29 DIAGNOSIS — M542 Cervicalgia: Secondary | ICD-10-CM | POA: Diagnosis not present

## 2016-10-29 DIAGNOSIS — G8929 Other chronic pain: Secondary | ICD-10-CM | POA: Diagnosis not present

## 2016-10-29 DIAGNOSIS — M25512 Pain in left shoulder: Secondary | ICD-10-CM | POA: Diagnosis not present

## 2016-10-29 DIAGNOSIS — R531 Weakness: Secondary | ICD-10-CM

## 2016-10-29 NOTE — Therapy (Signed)
[place], [place], [address] Phone: [phone]   Fax:  [phone]  Physical Therapy Treatment  Patient Details  Name: Joann Maddox MRN: [id number] Date of Birth: August 02, 1940 Referring Provider: Dr Dianah Field  Encounter Date: 10/29/2016      PT End of Session - 10/29/16 1450    Visit Number 19   Number of Visits 20   Date for PT Re-Evaluation 11/11/16   PT Start Time 2683   PT Stop Time 1539   PT Time Calculation (min) 58 min   Activity Tolerance Patient tolerated treatment well      Past Medical History:  Diagnosis Date  . Anxiety   . Headache(784.0)    migrains  . Heart murmur   . Hypothyroidism   . Rheumatoid arthritis(714.0)     Past Surgical History:  Procedure Laterality Date  . ABDOMINAL HYSTERECTOMY    . APPENDECTOMY    . CHOLECYSTECTOMY    . REPLACEMENT TOTAL KNEE BILATERAL    . TONSILLECTOMY     as  a child  . TOTAL HIP ARTHROPLASTY     right and left    There were no vitals filed for this visit.      Subjective Assessment - 10/29/16 1451    Subjective Neck is popping some now. She feels increased pain when she feels the popping. Neck hurts worse today. Shoulder is improving. Can lift arm up higher than she was.    Currently in Pain? Yes   Pain Score 7   with movement no pain when still    Pain Location Neck   Pain Orientation Left   Pain Descriptors / Indicators Aching;Dull   Pain Type Chronic pain   Pain Score 2   Pain Location Shoulder   Pain Orientation Left   Pain Descriptors / Indicators Aching;Tightness   Pain Type Chronic pain   Pain Onset More than a month ago   Pain Frequency Constant            [facility] PT Assessment - 10/29/16 0001      Assessment   Medical Diagnosis Lt adhesive capsulitis; cervical dysfunction    Referring Provider Dr Dianah Field   Onset Date/Surgical Date 03/11/16   Hand Dominance Right   Prior Therapy none for neck  and shoulder      AROM   Cervical Flexion 64   Cervical Extension 48   Cervical - Right Side Bend 38   Cervical - Left Side Bend 35   Cervical - Right Rotation 64   Cervical - Left Rotation 42     Palpation   Palpation comment improvement in muscular tightness Lt > Rt upper quarter including upper traps; leveator; pecs; ant/lat/posterior cervical musculature; anterior deltoid; biceps                      [facility] Adult PT Treatment/Exercise - 10/29/16 0001      Shoulder Exercises: Standing   Retraction Strengthening;Both;20 reps;Theraband   Theraband Level (Shoulder Retraction) Level 2 (Red)   Other Standing Exercises hip extension x 10 hands on wall at chest height; x 10 hands on wall just below chest height to engourage hip and trunk extension   Other Standing Exercises scap squeeze with noodle 10 sec x 10; scap retraction with shoulder abduction x 10/diagonals x 10; W's x 10      Shoulder Exercises: Pulleys   Flexion 2 minutes   ABduction 2 minutes  Moist Heat Therapy   Number Minutes Moist Heat 20 Minutes   Moist Heat Location Cervical;Shoulder     Electrical Stimulation   Electrical Stimulation Location Lt lateral cervical; Lt shoulder    Electrical Stimulation Action IFC   Electrical Stimulation Parameters to tolerance   Electrical Stimulation Goals Pain;Tone     Manual Therapy   Manual therapy comments pt sitting    Soft tissue mobilization Lt posterior shoulder cervical paraspinals STM with TPR          Trigger Point Dry Needling - 10/29/16 1458    Consent Given? Yes   Muscles Treated Upper Body --  Lt estim cervical musculature    Upper Trapezius Response Palpable increased muscle length   Levator Scapulae Response Palpable increased muscle length   Longissimus Response Palpable increased muscle length                   PT Long Term Goals - 10/29/16 1455      PT LONG TERM GOAL #1   Title Increase AROM cervical spine by 5-10  degrees in all planes 11/11/16   Time 16   Period Weeks   Status Partially Met     PT LONG TERM GOAL #2   Title Increase AROM Lt shoudler by 5-15 degrees in all planes 11/11/16   Time 16   Period Weeks   Status Partially Met     PT LONG TERM GOAL #3   Title Increase strength Lt/Rt shoulders allowing patient to use UE's for more functional activities 12/12/16    Time 16   Period Weeks   Status On-going     PT LONG TERM GOAL #4   Title Decrease frequency, intensity, and/or duration or pain by 25-30% allowing patient to move with greater ease and participate in daily activities 11/11/16   Time 16   Period Weeks   Status On-going     PT LONG TERM GOAL #5   Title Independent in HEP 11/11/16   Time 16   Period Weeks   Status On-going     PT LONG TERM GOAL #6   Title Improve FOTO to </= 41% limitation 11/11/16   Time 16   Period Weeks   Status On-going             Patient will benefit from skilled therapeutic intervention in order to improve the following deficits and impairments:     Visit Diagnosis: Cervicalgia  Chronic left shoulder pain  Other symptoms and signs involving the musculoskeletal system  Weakness generalized     Problem List Patient Active Problem List   Diagnosis Date Noted  . Left cervical radiculopathy 07/20/2016  . 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[place]) 02/12/2015  . Type 2 diabetes mellitus without complication ([place]) 03/88/8280  . Chronic constipation 02/19/2014  . Rosacea 11/06/2013  . GAD (generalized anxiety disorder) 06/08/2013  . Essential hypertension, benign 05/04/2013  . Rheumatoid arthritis ([place]) 05/04/2013  . Migraine with aura 05/04/2013  . Palpitations 05/04/2013  . Murmur, heart 05/04/2013  . Hyperlipidemia 05/04/2013  . Hypothyroidism 05/04/2013    Zared Knoth Nilda Simmer PT, MPH  10/29/2016, 3:15 PM  [facility] [place], [place], [address] Phone: [phone]   Fax:  [phone]  Name: KRISHAWNA STIEFEL MRN: [id number] Date of Birth: 1941/04/01

## 2016-11-04 ENCOUNTER — Other Ambulatory Visit: Payer: Self-pay | Admitting: Family Medicine

## 2016-11-04 ENCOUNTER — Ambulatory Visit (INDEPENDENT_AMBULATORY_CARE_PROVIDER_SITE_OTHER): Payer: Medicare Other | Admitting: Rehabilitative and Restorative Service Providers"

## 2016-11-04 ENCOUNTER — Encounter: Payer: Self-pay | Admitting: Rehabilitative and Restorative Service Providers"

## 2016-11-04 DIAGNOSIS — R531 Weakness: Secondary | ICD-10-CM | POA: Diagnosis not present

## 2016-11-04 DIAGNOSIS — R29898 Other symptoms and signs involving the musculoskeletal system: Secondary | ICD-10-CM

## 2016-11-04 DIAGNOSIS — G8929 Other chronic pain: Secondary | ICD-10-CM

## 2016-11-04 DIAGNOSIS — M25512 Pain in left shoulder: Secondary | ICD-10-CM

## 2016-11-04 DIAGNOSIS — M542 Cervicalgia: Secondary | ICD-10-CM | POA: Diagnosis not present

## 2016-11-04 NOTE — Therapy (Signed)
[facility] [place], [place], [address] Phone: [phone]   Fax:  [phone]  Physical Therapy Treatment  Patient Details  Name: Joann Maddox MRN: [id number] Date of Birth: Aug 17, 1940 Referring Provider: Dr Dianah Field  Encounter Date: 11/04/2016      PT End of Session - 11/04/16 0932    Visit Number 20   Number of Visits 20   Date for PT Re-Evaluation 11/11/16   PT Start Time 0930   PT Stop Time 1028   PT Time Calculation (min) 58 min   Activity Tolerance Patient tolerated treatment well      Past Medical History:  Diagnosis Date  . Anxiety   . Headache(784.0)    migrains  . Heart murmur   . Hypothyroidism   . Rheumatoid arthritis(714.0)     Past Surgical History:  Procedure Laterality Date  . ABDOMINAL HYSTERECTOMY    . APPENDECTOMY    . CHOLECYSTECTOMY    . REPLACEMENT TOTAL KNEE BILATERAL    . TONSILLECTOMY     as  a child  . TOTAL HIP ARTHROPLASTY     right and left    There were no vitals filed for this visit.      Subjective Assessment - 11/04/16 0933    Subjective Popping in her neck is better - she still has some popping but not as frequent as it was. Pain associated with the popping is less frequent and less intense. She feels she has made a lot of progress with therapy and home program.    Currently in Pain? Yes   Pain Score 3    Pain Location Neck   Pain Orientation Left   Pain Descriptors / Indicators Aching;Dull   Pain Type Chronic pain   Pain Onset More than a month ago   Pain Frequency Constant   Aggravating Factors  weather changes; end of day is worse    Pain Relieving Factors DN; massage; heat    Pain Score 4   Pain Location Shoulder   Pain Orientation Left   Pain Descriptors / Indicators Aching;Tightness   Pain Type Chronic pain   Pain Onset More than a month ago   Pain Frequency Intermittent   Aggravating Factors  reaching; bending forward; certain  movements using the Lt UE    Pain Relieving Factors DN; TENS; ice; heat; meds            [facility] PT Assessment - 11/04/16 0001      Assessment   Medical Diagnosis Lt adhesive capsulitis; cervical dysfunction    Referring Provider Dr Dianah Field   Onset Date/Surgical Date 03/11/16   Hand Dominance Right     Observation/Other Assessments   Focus on Therapeutic Outcomes (FOTO)  50% limitation      AROM   Overall AROM  --  shoulder ROM assessed w/ pt standing; cervical sitting    Right Shoulder Extension 70 Degrees   Right Shoulder Flexion 148 Degrees   Right Shoulder ABduction 135 Degrees   Right Shoulder Internal Rotation 60 Degrees   Right Shoulder External Rotation 82 Degrees   Left Shoulder Extension 60 Degrees   Left Shoulder Flexion 128 Degrees   Left Shoulder ABduction 110 Degrees   Left Shoulder Internal Rotation 60 Degrees   Left Shoulder External Rotation 61 Degrees   Cervical Flexion 64   Cervical Extension 48   Cervical - Right Side Bend 38   Cervical - Left Side Bend 35   Cervical -  Right Rotation 64   Cervical - Left Rotation 42     Palpation   Palpation comment improvement in muscular tightness Lt > Rt upper quarter including upper traps; leveator; pecs; ant/lat/posterior cervical musculature; anterior deltoid; biceps                      [facility] Adult PT Treatment/Exercise - 11/04/16 0001      Shoulder Exercises: Standing   Extension Strengthening;Both;20 reps;Theraband   Theraband Level (Shoulder Extension) Level 2 (Red)   Row Strengthening;Both;20 reps;Theraband   Theraband Level (Shoulder Row) Level 2 (Red)   Retraction Strengthening;Both;20 reps;Theraband   Theraband Level (Shoulder Retraction) Level 2 (Red)   Other Standing Exercises hip extension x 10 hands on wall at chest height; x 10 hands on wall just below chest height to engourage hip and trunk extension   Other Standing Exercises scap squeeze with noodle 10 sec x 10; scap  retraction with shoulder abduction x 10/diagonals x 10; W's x 10      Shoulder Exercises: Pulleys   Flexion 2 minutes   ABduction 2 minutes     Moist Heat Therapy   Number Minutes Moist Heat 20 Minutes   Moist Heat Location Cervical;Shoulder     Electrical Stimulation   Electrical Stimulation Location Lt lateral cervical; Lt shoulder    Electrical Stimulation Action IFC   Electrical Stimulation Parameters to tolerance   Electrical Stimulation Goals Pain;Tone     Manual Therapy   Manual therapy comments pt sitting    Soft tissue mobilization Lt posterior shoulder cervical paraspinals STM with TPR          Trigger Point Dry Needling - 11/04/16 0959    Consent Given? Yes   Muscles Treated Upper Body --  Lt cervical - estim with DN    Upper Trapezius Response Palpable increased muscle length   Pectoralis Major Response Palpable increased muscle length   Levator Scapulae Response Palpable increased muscle length   Longissimus Response Palpable increased muscle length                   PT Long Term Goals - 11/04/16 1005      PT LONG TERM GOAL #1   Title Increase AROM cervical spine by 5-10 degrees in all planes 11/11/16   Time 16   Period Weeks   Status Partially Met     PT LONG TERM GOAL #2   Title Increase AROM Lt shoudler by 5-15 degrees in all planes 11/11/16   Time 16   Period Weeks   Status Partially Met     PT LONG TERM GOAL #3   Title Increase strength Lt/Rt shoulders allowing patient to use UE's for more functional activities 12/12/16    Time 16   Period Weeks   Status Partially Met     PT LONG TERM GOAL #4   Title Decrease frequency, intensity, and/or duration or pain by 25-30% allowing patient to move with greater ease and participate in daily activities 11/11/16   Time 16   Period Weeks   Status On-going     PT LONG TERM GOAL #5   Title Independent in HEP 11/11/16   Time 16   Period Weeks   Status On-going     PT LONG TERM GOAL #6    Title Improve FOTO to </= 41% limitation 11/11/16   Time 16   Period Weeks   Status On-going  Plan - 11-23-16 1000    Clinical Impression Statement Overall good improvement with trigger point injections as well as dry needling and manual work. Patient reports decreased pain with less frequent and less intense pain. She continues to have constant pain in the neck but now reports intermittent pain in the Lt shoulder. She has decresaed palpable tightness through the Lt cervical and shoulder musculature; incresaed shoudler and cervical ROM; improved functional activity tolerance. She has made changes to positons in sitting and reports compliance with HEP. Joann Maddox needs to continue to work on posture and alignment to improve the cervical position. Joann Maddox has reached part of her goals for therapy and would benefit from continued treatment but she has reached her maximum allowable Medicare visits for the calendar year.    Rehab Potential Good   Clinical Impairments Affecting Rehab Potential multiple co-morbities    PT Frequency 2x / week   PT Duration 6 weeks   PT Treatment/Interventions Patient/family education;ADLs/Self Care Home Management;Cryotherapy;Electrical Stimulation;Iontophoresis 59m/ml Dexamethasone;Moist Heat;Ultrasound;Dry needling;Manual techniques;Therapeutic activities;Therapeutic exercise   PT Next Visit Plan Discharge to independnet HEP    Recommended Other Services Massage therapy on a regular basis; trigger point injections as indicated    Consulted and Agree with Plan of Care Patient      Patient will benefit from skilled therapeutic intervention in order to improve the following deficits and impairments:  Postural dysfunction, Improper body mechanics, Pain, Increased fascial restricitons, Increased muscle spasms, Decreased range of motion, Decreased mobility, Decreased strength, Impaired UE functional use, Decreased activity tolerance  Visit  Diagnosis: Cervicalgia  Chronic left shoulder pain  Other symptoms and signs involving the musculoskeletal system  Weakness generalized       [facility] PT PB G-CODES - [id number]/[id number]    Functional Assessment Tool Used  Re-evaluation; FOTO; clinical assessment    Functional Limitations Changing and maintaining body position   Changing and Maintaining Body Position Current Status ((Z6109 At least 40 percent but less than 60 percent impaired, limited or restricted   Changing and Maintaining Body Position Goal Status ((U0454 At least 20 percent but less than 40 percent impaired, limited or restricted   Changing and Maintaining Body Position Discharge Status ((U9811 At least 20 percent but less than 40 percent impaired, limited or restricted      Problem List Patient Active Problem List   Diagnosis Date Noted  . Left cervical radiculopathy 07/20/2016  . 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[place] 02/12/2015  . Type 2 diabetes mellitus without complication ([place] 091/47/8295 . Chronic constipation 02/19/2014  . Rosacea 11/06/2013  . GAD (generalized anxiety disorder) 06/08/2013  . Essential hypertension, benign 05/04/2013  . Rheumatoid arthritis ([place] 05/04/2013  . Migraine with aura 05/04/2013  . Palpitations 05/04/2013  . Murmur, heart 05/04/2013  . Hyperlipidemia 05/04/2013  . Hypothyroidism 05/04/2013    Aizik Reh PNilda SimmerPT, MPH  707-30-2018 12:59 PM  [facility] [address] [address]Phone: [phone]  Fax:  [phone] Name: Joann HANLINEMRN: [id number] of Birth: 3Feb 01, 1942 PHYSICAL THERAPY DISCHARGE SUMMARY  Visits from Start of Care: 20  Current functional level related to goals / functional outcomes: See progress note for discharge status   Remaining deficits: Continued cervical and shoulder pain    Education /  Equipment: HEP Plan: Patient agrees to discharge.  Patient goals were partially met. Patient is being discharged due to                                                     ?????  Patient has reached maximum allowable visits covered by insurance   Jquan Egelston P. Helene Kelp PT, MPH 11/04/16 1:00 PM

## 2016-11-05 ENCOUNTER — Ambulatory Visit (INDEPENDENT_AMBULATORY_CARE_PROVIDER_SITE_OTHER): Payer: Medicare Other | Admitting: Sports Medicine

## 2016-11-05 ENCOUNTER — Encounter: Payer: Self-pay | Admitting: Sports Medicine

## 2016-11-05 DIAGNOSIS — M47812 Spondylosis without myelopathy or radiculopathy, cervical region: Secondary | ICD-10-CM

## 2016-11-05 NOTE — Progress Notes (Signed)
  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 Moderate, persistent without radiation.  Past medical history:  Negative.  See flowsheet/record as well for more information.  Surgical history: Negative.  See flowsheet/record as well for more information.  Family history: Negative.  See flowsheet/record as well for more information.  Social history: Negative.  See flowsheet/record as well for more information.  Allergies, and medications have been entered into the medical record, reviewed, and no changes needed.   Review of Systems: No fevers, chills, night sweats, weight loss, chest pain, or shortness of breath.   Objective:    General: Well Developed, well nourished, and in no acute distress.  Neuro: Alert and oriented x3, extra-ocular muscles intact, sensation grossly intact.  HEENT: Normocephalic, atraumatic, pupils equal round reactive to light, neck supple, no masses, no lymphadenopathy, thyroid nonpalpable.  Skin: Warm and dry, no rashes. Cardiac: Regular rate and rhythm, no murmurs rubs or gallops, no lower extremity edema.  Respiratory: Clear to auscultation bilaterally. Not using accessory muscles, speaking in full sentences.  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  Impression and Recommendations:    Cervical spondylosis Persistent axial left-sided cervical pain, likely facetogenic. Did not have much of response to previous epidurals. Physical therapy entered a point injections provided some relief but insufficient. 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 After one month if insufficient relief we will proceed with C5-C6, C6-C7, and C7-T1 left-sided facet joint injections.  I spent 25 minutes with this patient, greater than 50% was face-to-face time counseling regarding the above diagnoses

## 2016-11-05 NOTE — Assessment & Plan Note (Signed)
Persistent axial left-sided cervical pain, likely facetogenic. Did not have much of response to previous epidurals. Physical therapy entered a point injections provided some relief but insufficient. 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 After one month if insufficient relief we will proceed with C5-C6, C6-C7, and C7-T1 left-sided facet joint injections.

## 2016-12-03 ENCOUNTER — Other Ambulatory Visit: Payer: Self-pay | Admitting: Sports Medicine

## 2016-12-03 ENCOUNTER — Ambulatory Visit
Admission: RE | Admit: 2016-12-03 | Discharge: 2016-12-03 | Disposition: A | Discharge status: Home / Self Care | Payer: Medicare Other | Source: Ambulatory Visit | Attending: Sports Medicine | Admitting: Sports Medicine

## 2016-12-03 DIAGNOSIS — M47812 Spondylosis without myelopathy or radiculopathy, cervical region: Secondary | ICD-10-CM

## 2016-12-03 MED ORDER — IOPAMIDOL (ISOVUE-M 300) INJECTION 61%
1.0000 mL | Freq: Once | INTRAMUSCULAR | Status: AC | PRN
  Administered 2016-12-03: 1 mL via INTRA_ARTICULAR

## 2016-12-03 MED ORDER — DEXAMETHASONE SODIUM PHOSPHATE 4 MG/ML IJ SOLN
6.0000 mg | Freq: Once | INTRAMUSCULAR | Status: AC
  Administered 2016-12-03: 6 mg via INTRA_ARTICULAR

## 2016-12-03 NOTE — Discharge Instructions (Signed)

## 2016-12-04 ENCOUNTER — Other Ambulatory Visit: Payer: Self-pay | Admitting: Sports Medicine

## 2016-12-04 ENCOUNTER — Other Ambulatory Visit: Payer: Self-pay | Admitting: Family Medicine

## 2016-12-05 ENCOUNTER — Other Ambulatory Visit: Payer: Self-pay | Admitting: Family Medicine

## 2016-12-06 NOTE — Telephone Encounter (Signed)
Call pt: needs vitamin D checked before we refill. It looks like it has been a long time since we checked th elevel.

## 2016-12-14 ENCOUNTER — Other Ambulatory Visit: Payer: Self-pay | Admitting: Family Medicine

## 2016-12-14 DIAGNOSIS — Z1231 Encounter for screening mammogram for malignant neoplasm of breast: Secondary | ICD-10-CM

## 2016-12-15 ENCOUNTER — Ambulatory Visit (INDEPENDENT_AMBULATORY_CARE_PROVIDER_SITE_OTHER): Payer: Medicare Other | Admitting: Family Medicine

## 2016-12-15 ENCOUNTER — Encounter: Payer: Self-pay | Admitting: Family Medicine

## 2016-12-15 ENCOUNTER — Ambulatory Visit (INDEPENDENT_AMBULATORY_CARE_PROVIDER_SITE_OTHER): Payer: Medicare Other

## 2016-12-15 VITALS — BP 133/76 | HR 80 | Wt 207.0 lb

## 2016-12-15 DIAGNOSIS — I10 Essential (primary) hypertension: Secondary | ICD-10-CM

## 2016-12-15 DIAGNOSIS — E119 Type 2 diabetes mellitus without complications: Secondary | ICD-10-CM

## 2016-12-15 DIAGNOSIS — R062 Wheezing: Secondary | ICD-10-CM

## 2016-12-15 DIAGNOSIS — R0602 Shortness of breath: Secondary | ICD-10-CM

## 2016-12-15 DIAGNOSIS — J209 Acute bronchitis, unspecified: Secondary | ICD-10-CM | POA: Diagnosis not present

## 2016-12-15 DIAGNOSIS — E559 Vitamin D deficiency, unspecified: Secondary | ICD-10-CM

## 2016-12-15 DIAGNOSIS — M069 Rheumatoid arthritis, unspecified: Secondary | ICD-10-CM | POA: Diagnosis not present

## 2016-12-15 DIAGNOSIS — E038 Other specified hypothyroidism: Secondary | ICD-10-CM | POA: Diagnosis not present

## 2016-12-15 LAB — POCT GLYCOSYLATED HEMOGLOBIN (HGB A1C): Hemoglobin A1C: 5.6

## 2016-12-15 LAB — POCT UA - MICROALBUMIN
Creatinine, POC: 100 mg/dL
Microalbumin Ur, POC: 80 mg/L

## 2016-12-15 MED ORDER — AZITHROMYCIN 250 MG PO TABS: ORAL_TABLET | ORAL | 0 refills | Status: DC

## 2016-12-15 MED ORDER — ALBUTEROL SULFATE (2.5 MG/3ML) 0.083% IN NEBU
2.5000 mg | INHALATION_SOLUTION | Freq: Once | RESPIRATORY_TRACT | Status: AC
  Administered 2016-12-15: 2.5 mg via RESPIRATORY_TRACT

## 2016-12-15 MED ORDER — PREDNISONE 20 MG PO TABS: 40.0000 mg | ORAL_TABLET | Freq: Every day | ORAL | 0 refills | Status: DC

## 2016-12-15 NOTE — Progress Notes (Addendum)
Subjective:    CC: F/U bronchitis, DM   HPI:  Diabetes - no hypoglycemic events. No wounds or sores that are not healing well. No increased thirst or urination. Checking glucose at home. Taking medications as prescribed without any side effects.  RA - she feels like her rheumatologist isn't really helping her. She wants to come off her plaquenil and  MTX.    See on 8/17 at [facility] for bronchitis. She initially presented with cough, runny nose with no fever. She was put on Augmentin, Proair, and tessalon perlesshe started initially feeling bad about 8 days ago and has been on antibiotics for about 4 days. Unfortunately, she started experiencing some severe diarrhea that started yesterday on the Augmentin. She is really not feeling any better but she's not feeling any worse. No fevers chills or sweats. She says like she still has a lot of mucus in her chest is difficult to move. She did try using the albuterol that I gave her but she had difficulty with the device she was never really used an inhaler before.  Past medical history, Surgical history, Family history not pertinant except as noted below, Social history, Allergies, and medications have been entered into the medical record, reviewed, and corrections made.   Review of Systems: No fevers, chills, night sweats, weight loss, chest pain, or shortness of breath.   Objective:    General: Well Developed, well nourished, and in no acute distress.  Neuro: Alert and oriented x3, extra-ocular muscles intact, sensation grossly intact.  HEENT: Normocephalic, atraumatic  Skin: Warm and dry, no rashes. Cardiac: Regular rate and rhythm, no murmurs rubs or gallops, no lower extremity edema.  Respiratory:diffuse expiratory and inspiratory wheezing.. Not using accessory muscles, speaking in full sentences.   Impression and Recommendations:    DM- Well controlled. Continue current regimen. Follow up in  4 months.  A1 of 5.6 today. E is feeling a  small amount of protein in the urine but is on an ACE inhibitor.  Rheumatoid arthritis-encouraged her to have a conversation with her rheumatologist about the fact that she feels her medications are not working and that she is experiencing a lot more mobility problems and stiffness and pain. It sounds like they have tried multiple things in the past and had difficulty finding something that works well for her. Explained to her and her husband that these medications help reduce the progression of the disease but don't necessarily always fully treat pan and inflammation. He has gotten some benefit from physical therapy settings be helpful modality again in the future.   Vit D def - due to recheck  Hypothyroidism-due to recheck TSH. TSH was a little bit on the low end at 0.4 back in April.  Acute bronchitis-patient with significant wheezing on exam today so given a Bier on nebulizer treatment. Patient looks very tired today. And looks like she doesn't feel well. I did recommend a trial of Mucinex.

## 2016-12-21 ENCOUNTER — Telehealth: Payer: Self-pay | Admitting: *Deleted

## 2016-12-21 MED ORDER — FLUCONAZOLE 150 MG PO TABS: 150.0000 mg | ORAL_TABLET | Freq: Once | ORAL | 0 refills | Status: AC

## 2016-12-21 NOTE — Telephone Encounter (Signed)
Patient called and states she was recently seen and given an adntibiotic. She now has itching and redness in the vaginal area. She most likely has a yeast infection from antibiotic. Will send diflucan. If it does clear up then needs appointment.

## 2016-12-30 ENCOUNTER — Ambulatory Visit: Payer: Medicare Other

## 2016-12-30 ENCOUNTER — Ambulatory Visit (INDEPENDENT_AMBULATORY_CARE_PROVIDER_SITE_OTHER): Payer: Medicare Other

## 2016-12-30 DIAGNOSIS — Z1231 Encounter for screening mammogram for malignant neoplasm of breast: Secondary | ICD-10-CM

## 2016-12-31 ENCOUNTER — Ambulatory Visit: Payer: Medicare Other | Admitting: Sports Medicine

## 2016-12-31 ENCOUNTER — Encounter: Payer: Self-pay | Admitting: Family Medicine

## 2016-12-31 ENCOUNTER — Ambulatory Visit (INDEPENDENT_AMBULATORY_CARE_PROVIDER_SITE_OTHER): Payer: Medicare Other | Admitting: Family Medicine

## 2016-12-31 VITALS — BP 121/64 | HR 89 | Wt 205.0 lb

## 2016-12-31 DIAGNOSIS — J209 Acute bronchitis, unspecified: Secondary | ICD-10-CM | POA: Diagnosis not present
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
## 2017-01-01 ENCOUNTER — Ambulatory Visit (INDEPENDENT_AMBULATORY_CARE_PROVIDER_SITE_OTHER): Payer: Medicare Other | Admitting: Sports Medicine

## 2017-01-01 ENCOUNTER — Encounter: Payer: Self-pay | Admitting: Sports Medicine

## 2017-01-01 ENCOUNTER — Other Ambulatory Visit: Payer: Self-pay | Admitting: Family Medicine

## 2017-01-01 DIAGNOSIS — M47812 Spondylosis without myelopathy or radiculopathy, cervical region: Secondary | ICD-10-CM | POA: Diagnosis not present

## 2017-01-01 NOTE — Assessment & Plan Note (Signed)
Persistent axial left-sided cervical pain, facetogenic. Did not have response to epidurals previously. Physical therapy and trigger point injections only provided minimal relief. We started with a left C2-C3, C3-C4, and a C4-C5 injection on the left, these didn't provide any relief, not even temporary. Because her facet arthritis is throughout the entire cervical spine we are now going to proceed with a left-sided C5-C6, C6-C7, C7-T1 facet joint injections. Return in one month after injections to evaluate response. Some of her pain is coming from her shoulder itself. She is taking tramadol which does provide moderate relief.

## 2017-01-01 NOTE — Progress Notes (Signed)
  Subjective:    CC: Follow-up  HPI: This is a pleasant 76 year old female, she has multilevel cervical facet arthritis, with left-sided axial neck pain. We initially started with upper cervical facet injections which provided no relief, not even temporary. At this point she agrees to proceed with lower level facet joint injections. She also has some pain that she localizes over her deltoid, worse with shoulder movement. She understands that this is likely a separate issue.  Past medical history:  Negative.  See flowsheet/record as well for more information.  Surgical history: Negative.  See flowsheet/record as well for more information.  Family history: Negative.  See flowsheet/record as well for more information.  Social history: Negative.  See flowsheet/record as well for more information.  Allergies, and medications have been entered into the medical record, reviewed, and no changes needed.   Review of Systems: No fevers, chills, night sweats, weight loss, chest pain, or shortness of breath.   Objective:    General: Well Developed, well nourished, and in no acute distress.  Neuro: Alert and oriented x3, extra-ocular muscles intact, sensation grossly intact.  HEENT: Normocephalic, atraumatic, pupils equal round reactive to light, neck supple, no masses, no lymphadenopathy, thyroid nonpalpable.  Skin: Warm and dry, no rashes. Cardiac: Regular rate and rhythm, no murmurs rubs or gallops, no lower extremity edema.  Respiratory: Clear to auscultation bilaterally. Not using accessory muscles, speaking in full sentences. Left Shoulder: Inspection reveals no abnormalities, atrophy or asymmetry. Palpation is normal with no tenderness over AC joint or bicipital groove. External rotation to only about 20. Rotator cuff strength normal throughout. Positive Neer and Hawkin's tests, empty can. Speeds and Yergason's tests normal. No labral pathology noted with negative Obrien's, negative crank,  negative clunk, and good stability. Normal scapular function observed. No painful arc and no drop arm sign. No apprehension sign  Impression and Recommendations:    Cervical spondylosis Persistent axial left-sided cervical pain, facetogenic. Did not have response to epidurals previously. Physical therapy and trigger point injections only provided minimal relief. We started with a left C2-C3, C3-C4, and a C4-C5 injection on the left, these didn't provide any relief, not even temporary. Because her facet arthritis is throughout the entire cervical spine we are now going to proceed with a left-sided C5-C6, C6-C7, C7-T1 facet joint injections. Return in one month after injections to evaluate response. Some of her pain is coming from her shoulder itself. She is taking tramadol which does provide moderate relief.  I spent 25 minutes with this patient, greater than 50% was face-to-face time counseling regarding the above diagnoses ___________________________________________ Ihor Austin. Benjamin Stain, M.D., ABFM., CAQSM. Primary Care and Sports Medicine [facility]  Adjunct Instructor of Family Medicine  [employer]

## 2017-01-12 ENCOUNTER — Other Ambulatory Visit: Payer: Self-pay | Admitting: Family Medicine

## 2017-01-19 ENCOUNTER — Other Ambulatory Visit: Payer: Self-pay | Admitting: Family Medicine

## 2017-01-30 ENCOUNTER — Other Ambulatory Visit: Payer: Self-pay | Admitting: Family Medicine

## 2017-02-05 ENCOUNTER — Ambulatory Visit (INDEPENDENT_AMBULATORY_CARE_PROVIDER_SITE_OTHER): Payer: Medicare Other | Admitting: Family Medicine

## 2017-02-05 ENCOUNTER — Encounter: Payer: Self-pay | Admitting: Family Medicine

## 2017-02-05 VITALS — BP 116/60 | HR 78 | Ht 60.0 in | Wt 210.0 lb

## 2017-02-05 DIAGNOSIS — R21 Rash and other nonspecific skin eruption: Secondary | ICD-10-CM

## 2017-02-05 DIAGNOSIS — M7989 Other specified soft tissue disorders: Secondary | ICD-10-CM | POA: Diagnosis not present

## 2017-02-05 DIAGNOSIS — R011 Cardiac murmur, unspecified: Secondary | ICD-10-CM | POA: Diagnosis not present

## 2017-02-05 DIAGNOSIS — M47812 Spondylosis without myelopathy or radiculopathy, cervical region: Secondary | ICD-10-CM | POA: Diagnosis not present

## 2017-02-05 MED ORDER — HYDROCORTISONE VALERATE 0.2 % EX CREA: 1.0000 "application " | TOPICAL_CREAM | Freq: Every day | CUTANEOUS | 0 refills | Status: DC | PRN

## 2017-02-05 MED ORDER — TIZANIDINE HCL 4 MG PO TABS: 2.0000 mg | ORAL_TABLET | Freq: Three times a day (TID) | ORAL | 0 refills | Status: DC | PRN

## 2017-02-05 NOTE — Patient Instructions (Signed)
Take extra dose of Lasix around noon today. Elevate them for about 15-20 minutes a couple of times during the day. Keep wearing her compression stockings. The swelling in your feet and legs is not better by Monday then please let me know.

## 2017-02-05 NOTE — Progress Notes (Signed)
Subjective:    Patient ID: Joann Maddox, female    DOB: August 06, 1940, 76 y.o.   MRN: [id number]  HPI She started developing a red spotty rash on her facial cheeks x 1 week. No change in soaps, lotions, diet, etc.  Using cetaphil. Not using make-up. No recent med changes.  Not a pools or hot tubs.  Rash is only located on her face and no where else. The little worse on the left facial cheek. She denies any itching. No recent history of hives or allergic reactions.  She notices lower extremity swelling x 4 days.  Feels lthe left if worse. Goes up towards her knee. She took one lasix this morning.  Denies any recent increase in acitivity. Traveling to [place] and they go back on Tuesday, 3 days ago.  Has been wearing her compression stocking but didn't wear them today.  She did wear her compression stockings during her car ride home from [place].  She also complains of persistent left shoulder pain and left trapezius pain. She's been working with our sports medicine provider Dr. Benjamin Stain on this. She does have a special pillow that she's been using some. He had recommended another injection but she really doesn't want to go through this because the first one was very painful and was not effective. She has been using some heat on it.   Review of Systems   BP 116/60   Pulse 78   Ht 5' (1.524 m)   Wt 210 lb (95.3 kg)   SpO2 98%   BMI 41.01 kg/m     Allergies  Allergen Reactions  . Iodine Other (See Comments)    Shook violently and passed out.  . Codeine Other (See Comments)    Hallucinations  . Lipitor [Atorvastatin] Other (See Comments)    Myalgias    Past Medical History:  Diagnosis Date  . Anxiety   . Headache(784.0)    migrains  . Heart murmur   . Hypothyroidism   . Rheumatoid arthritis(714.0)     Past Surgical History:  Procedure Laterality Date  . ABDOMINAL HYSTERECTOMY    . APPENDECTOMY    . CHOLECYSTECTOMY    . REPLACEMENT TOTAL KNEE BILATERAL    .  TONSILLECTOMY     as  a child  . TOTAL HIP ARTHROPLASTY     right and left    Social History   Social History  . Marital status: Married    Spouse name: Dorene Sorrow  . Number of children: 2  . Years of education: N/A   Occupational History  . Retired Emergency planning/management officer   Social History Main Topics  . Smoking status: Never Smoker  . Smokeless tobacco: Never Used  . Alcohol use No  . Drug use: Unknown  . Sexual activity: Not on file   Other Topics Concern  . Not on file   Social History Narrative   No regular exercise. 2 caffeine drinks per day.     Family History  Problem Relation Age of Onset  . Heart disease Father 77  . Diabetes Unknown        aunt   . Stroke Mother 32    Outpatient Encounter Prescriptions as of 02/05/2017  Medication Sig  . AZELEX 20 % cream APPLY TOPICALL 2 TIMES A DAY (MORNING AND EVENING). APPLY AFTER SKIN IS WASHED AND PATTED DRY.  . butalbital-acetaminophen-caffeine (FIORICET, ESGIC) 50-325-40 MG tablet TAKE 1 TABLET BY MOUTH TWICE A DAY AS NEEDED  .  DULoxetine (CYMBALTA) 60 MG capsule Take 1 capsule (60 mg total) by mouth daily.  . folic acid (FOLVITE) 1 MG tablet TAKE 1 TABLET BY MOUTH EVERY DAY  . furosemide (LASIX) 20 MG tablet TAKE 1 TABLET (20 MG TOTAL) BY MOUTH DAILY AS NEEDED.  . hydroxychloroquine (PLAQUENIL) 200 MG tablet Take 200 mg by mouth daily.  Marland Kitchen KLOR-CON M10 10 MEQ tablet TAKE 1 TABLET (10 MEQ TOTAL) BY MOUTH 2 (TWO) TIMES DAILY.  Marland Kitchen levothyroxine (SYNTHROID, LEVOTHROID) 25 MCG tablet TAKE 1 TABLET (25 MCG TOTAL) BY MOUTH DAILY.  Marland Kitchen lisinopril-hydrochlorothiazide (PRINZIDE,ZESTORETIC) 20-25 MG tablet TAKE 1 TABLET BY MOUTH EVERY DAY  . meloxicam (MOBIC) 15 MG tablet TAKE ONE TABLET BY MOUTH EACH AM WITH BREAKFAST FOR 2 WEEKS, THEN TAKE DAILY AS NEEDED  . metFORMIN (GLUCOPHAGE) 500 MG tablet TAKE 1 TABLET BY MOUTH 2 TIMES DAILY WITH A MEAL.  . methotrexate 50 MG/2ML injection 1 ML WEEKLY INJECTION 30 DAYS  . Multiple  Vitamin (MULTIVITAMIN) tablet Take 1 tablet by mouth daily.  Marland Kitchen MYRBETRIQ 25 MG TB24 tablet TAKE 1 TABLET BY MOUTH EVERY DAY  . nystatin (NYSTATIN) powder Apply 1 g topically 2 (two) times daily. X 3 weeks.  Marland Kitchen PROAIR HFA 108 (90 Base) MCG/ACT inhaler Inhale 2 puffs into the lungs every 4 (four) hours as needed.  . rosuvastatin (CRESTOR) 10 MG tablet Take 1 tablet (10 mg total) by mouth daily.  Marland Kitchen senna (SENOKOT) 8.6 MG tablet Take 1 tablet by mouth as needed.   . traMADol (ULTRAM) 50 MG tablet TAKE 1-2 TABS BY MOUTH EVERY 12 HOURS AS NEEDED FOR PAIN  . vitamin B-12 (CYANOCOBALAMIN) 100 MCG tablet Take 50 mcg by mouth daily.  . Vitamin D, Ergocalciferol, (DRISDOL) 50000 units CAPS capsule TAKE 1 CAPSULE (50,000 UNITS TOTAL) BY MOUTH ONCE A WEEK.  . hydrocortisone valerate cream (WESTCORT) 0.2 % Apply 1 application topically daily as needed.  Marland Kitchen tiZANidine (ZANAFLEX) 4 MG tablet Take 0.5-1 tablets (2-4 mg total) by mouth every 8 (eight) hours as needed for muscle spasms.   No facility-administered encounter medications on file as of 02/05/2017.           Objective:   Physical Exam  Constitutional: She is oriented to person, place, and time. She appears well-developed and well-nourished.  HENT:  Head: Normocephalic and atraumatic.  She has had a few erythematous papules on her facial cheeks bilaterally. The ones on the left facial cheek are just a little bit bigger and more pronounced. No vesicles. No excoriations. No open wounds.  Neck:  No carotid bruit on the left.  Cardiovascular: Normal rate and regular rhythm.   Murmur heard. 3/6 SEM  Musculoskeletal: She exhibits edema.  She has 2+ pitting edema in her feet and 1+ pitting edema over the anterior tibia bilaterally up to her knees bilaterally. Overall it looks fairly symmetric.  Neurological: She is alert and oriented to person, place, and time.  Skin: Skin is warm and dry.  Psychiatric: She has a normal mood and affect. Her behavior  is normal.        Assessment & Plan:  Rash on face-unclear etiology. It doesn't seem to be some type of contact dermatitis or allergic reaction. I'm not sure what would have caused her skin to break out. She's really just using a set of fill soap and no other products on her face and she has no rash anywhere else on her body. She did recently travel [place] in the long car ride  from [place] so consider could be some type of photodermatitis going on. We'll try a very low dose steroid to apply just to the lesions. Discussed applying a very thin amount. If not improving over the next week or 2 them please let me know or if it gets worse please let me know.  Cervical radiculopathy with left trapezius pain-she is not wanting to injection at this point we can try a muscle relaxer nightly see if that helps reduce the current flare that she is in with her pain. Also recommend that she follow with Dr. Benjamin Stain to discuss treatment alternatives she really does not want to do the injections.  Lower extremity swelling-I think this purely is just secondary to recent travel and sitting in the car that she did wear her compression stockings. Swelling is significant but it is bilateral. She also feels like her swelling is actually better today than it was yesterday some have her take an extra tab of Lasix around noon. No other symptoms or signs of DVT at this time.

## 2017-02-06 LAB — CBC WITH DIFFERENTIAL/PLATELET
Basophils Absolute: 48 cells/uL (ref 0–200)
Basophils Relative: 1.1 %
Eosinophils Absolute: 198 cells/uL (ref 15–500)
Eosinophils Relative: 4.5 %
HCT: 35.6 % (ref 35.0–45.0)
Hemoglobin: 12.1 g/dL (ref 11.7–15.5)
Lymphs Abs: 1848 cells/uL (ref 850–3900)
MCH: 30.6 pg (ref 27.0–33.0)
MCHC: 34 g/dL (ref 32.0–36.0)
MCV: 89.9 fL (ref 80.0–100.0)
MPV: 9.7 fL (ref 7.5–12.5)
Monocytes Relative: 10.7 %
Neutro Abs: 1835 cells/uL (ref 1500–7800)
Neutrophils Relative %: 41.7 %
Platelets: 238 10*3/uL (ref 140–400)
RBC: 3.96 10*6/uL (ref 3.80–5.10)
RDW: 12.7 % (ref 11.0–15.0)
Total Lymphocyte: 42 %
WBC mixed population: 471 cells/uL (ref 200–950)
WBC: 4.4 10*3/uL (ref 3.8–10.8)

## 2017-02-06 LAB — BASIC METABOLIC PANEL WITH GFR
BUN: 20 mg/dL (ref 7–25)
CO2: 28 mmol/L (ref 20–32)
Calcium: 9.5 mg/dL (ref 8.6–10.4)
Chloride: 106 mmol/L (ref 98–110)
Creat: 0.7 mg/dL (ref 0.60–0.93)
GFR, Est African American: 99 mL/min/{1.73_m2} (ref >=60)
GFR, Est Non African American: 85 mL/min/{1.73_m2} (ref >=60)
Glucose, Bld: 96 mg/dL (ref 65–139)
Potassium: 4.1 mmol/L (ref 3.5–5.3)
Sodium: 141 mmol/L (ref 135–146)

## 2017-02-08 ENCOUNTER — Telehealth: Payer: Self-pay | Admitting: Family Medicine

## 2017-02-08 NOTE — Telephone Encounter (Signed)
Patient called adv that she spoke to [place] regarding her feet she thought she was fine but her feet are swelling and now she wants to know if she needs to come in for an appointment. Thanks

## 2017-02-08 NOTE — Telephone Encounter (Signed)
Have her take an extra dose of Lasix tomorrow around noon area did I think we had her do that last week. See if that was helpful or not.

## 2017-02-08 NOTE — Telephone Encounter (Signed)
Pt informed of recommendations. She stated that this did help however it helps but they swell back up when she moves around. She stated that she has some pain when she gets around. She agreed to taking the additional lasix advised that she may need to f/u .[place]e [place]

## 2017-02-09 ENCOUNTER — Telehealth: Payer: Self-pay | Admitting: Family Medicine

## 2017-02-09 NOTE — Telephone Encounter (Signed)
Spoke with Pt, advised she needs an appointment. Transferred to scheduler.

## 2017-02-09 NOTE — Telephone Encounter (Signed)
Pt called. She took lasix as recommended but legs are still swollen. Two acute appointments available tomorrow at 10:10 & 11:30 Thank you.

## 2017-02-11 ENCOUNTER — Encounter: Payer: Self-pay | Admitting: Family Medicine

## 2017-02-11 ENCOUNTER — Ambulatory Visit (INDEPENDENT_AMBULATORY_CARE_PROVIDER_SITE_OTHER): Payer: Medicare Other | Admitting: Family Medicine

## 2017-02-11 VITALS — BP 138/57 | HR 68 | Ht 60.0 in | Wt 210.0 lb

## 2017-02-11 DIAGNOSIS — Z79899 Other long term (current) drug therapy: Secondary | ICD-10-CM | POA: Diagnosis not present

## 2017-02-11 DIAGNOSIS — R600 Localized edema: Secondary | ICD-10-CM | POA: Diagnosis not present

## 2017-02-11 DIAGNOSIS — R7989 Other specified abnormal findings of blood chemistry: Secondary | ICD-10-CM | POA: Diagnosis not present

## 2017-02-11 DIAGNOSIS — E559 Vitamin D deficiency, unspecified: Secondary | ICD-10-CM | POA: Diagnosis not present

## 2017-02-11 LAB — BASIC METABOLIC PANEL WITH GFR
BUN: 19 mg/dL (ref 7–25)
CO2: 29 mmol/L (ref 20–32)
Calcium: 9.7 mg/dL (ref 8.6–10.4)
Chloride: 106 mmol/L (ref 98–110)
Creat: 0.74 mg/dL (ref 0.60–0.93)
GFR, Est African American: 92 mL/min/{1.73_m2} (ref >=60)
GFR, Est Non African American: 80 mL/min/{1.73_m2} (ref >=60)
Glucose, Bld: 85 mg/dL (ref 65–99)
Potassium: 4.5 mmol/L (ref 3.5–5.3)
Sodium: 142 mmol/L (ref 135–146)

## 2017-02-11 MED ORDER — FUROSEMIDE 10 MG/ML IJ SOLN
20.0000 mg | Freq: Once | INTRAMUSCULAR | Status: AC
  Administered 2017-02-11: 20 mg via INTRAMUSCULAR

## 2017-02-11 MED ORDER — VITAMIN D (ERGOCALCIFEROL) 1.25 MG (50000 UNIT) PO CAPS: ORAL_CAPSULE | ORAL | 2 refills | Status: DC

## 2017-02-11 NOTE — Progress Notes (Addendum)
Subjective:    Patient ID: Joann Maddox, female    DOB: [birth year], 76 y.o.   MRN: [id number]  HPI 76 year old female comes in for bilateral lower extremity edema. This is been short of her recurrent problem the last couple office visits that have seen her. We were able to get control by increasing her Lasix but more recently when she took an extra dose it really did not seem to improve her swelling. When I saw her 4 days ago for the symptoms she had just been traveling home from [place] the long car ride that she did wear her compression stockings. The swelling was actually better the day that she came in so it seem to be resolving on its own to some degree.  She feels the redness is worse at night.   She normally wears compression stockings but says her legs have been so swollen she hasn't even been able to get them on.  Review of Systems  No chest pain or shortness of breath  BP (!) 138/57   Pulse 68   Ht 5' (1.524 m)   Wt 210 lb (95.3 kg)   SpO2 100%   BMI 41.01 kg/m     Allergies  Allergen Reactions  . Iodine Other (See Comments)    Shook violently and passed out.  . Codeine Other (See Comments)    Hallucinations  . Lipitor [Atorvastatin] Other (See Comments)    Myalgias    Past Medical History:  Diagnosis Date  . Anxiety   . Headache(784.0)    migrains  . Heart murmur   . Hypothyroidism   . Rheumatoid arthritis(714.0)     Past Surgical History:  Procedure Laterality Date  . ABDOMINAL HYSTERECTOMY    . APPENDECTOMY    . CHOLECYSTECTOMY    . REPLACEMENT TOTAL KNEE BILATERAL    . TONSILLECTOMY     as  a child  . TOTAL HIP ARTHROPLASTY     right and left    Social History   Social History  . Marital status: Married    Spouse name: Dorene Sorrow  . Number of children: 2  . Years of education: N/A   Occupational History  . Retired Emergency planning/management officer   Social History Main Topics  . Smoking status: Never Smoker  . Smokeless tobacco: Never Used  .  Alcohol use No  . Drug use: Unknown  . Sexual activity: Not on file   Other Topics Concern  . Not on file   Social History Narrative   No regular exercise. 2 caffeine drinks per day.     Family History  Problem Relation Age of Onset  . Heart disease Father 83  . Diabetes Unknown        aunt   . Stroke Mother 71    Outpatient Encounter Prescriptions as of 02/11/2017  Medication Sig  . AZELEX 20 % cream APPLY TOPICALL 2 TIMES A DAY (MORNING AND EVENING). APPLY AFTER SKIN IS WASHED AND PATTED DRY.  . butalbital-acetaminophen-caffeine (FIORICET, ESGIC) 50-325-40 MG tablet TAKE 1 TABLET BY MOUTH TWICE A DAY AS NEEDED  . DULoxetine (CYMBALTA) 60 MG capsule Take 1 capsule (60 mg total) by mouth daily.  . folic acid (FOLVITE) 1 MG tablet TAKE 1 TABLET BY MOUTH EVERY DAY  . furosemide (LASIX) 20 MG tablet TAKE 1 TABLET (20 MG TOTAL) BY MOUTH DAILY AS NEEDED.  . hydrocortisone valerate cream (WESTCORT) 0.2 % Apply 1 application topically daily as needed.  Marland Kitchen  hydroxychloroquine (PLAQUENIL) 200 MG tablet Take 200 mg by mouth daily.  Marland Kitchen KLOR-CON M10 10 MEQ tablet TAKE 1 TABLET (10 MEQ TOTAL) BY MOUTH 2 (TWO) TIMES DAILY.  Marland Kitchen levothyroxine (SYNTHROID, LEVOTHROID) 25 MCG tablet TAKE 1 TABLET (25 MCG TOTAL) BY MOUTH DAILY.  Marland Kitchen lisinopril-hydrochlorothiazide (PRINZIDE,ZESTORETIC) 20-25 MG tablet TAKE 1 TABLET BY MOUTH EVERY DAY  . meloxicam (MOBIC) 15 MG tablet TAKE ONE TABLET BY MOUTH EACH AM WITH BREAKFAST FOR 2 WEEKS, THEN TAKE DAILY AS NEEDED  . metFORMIN (GLUCOPHAGE) 500 MG tablet TAKE 1 TABLET BY MOUTH 2 TIMES DAILY WITH A MEAL.  . methotrexate 50 MG/2ML injection 1 ML WEEKLY INJECTION 30 DAYS  . Multiple Vitamin (MULTIVITAMIN) tablet Take 1 tablet by mouth daily.  Marland Kitchen MYRBETRIQ 25 MG TB24 tablet TAKE 1 TABLET BY MOUTH EVERY DAY  . nystatin (NYSTATIN) powder Apply 1 g topically 2 (two) times daily. X 3 weeks.  Marland Kitchen PROAIR HFA 108 (90 Base) MCG/ACT inhaler Inhale 2 puffs into the lungs every 4 (four)  hours as needed.  . rosuvastatin (CRESTOR) 10 MG tablet Take 1 tablet (10 mg total) by mouth daily.  Marland Kitchen senna (SENOKOT) 8.6 MG tablet Take 1 tablet by mouth as needed.   Marland Kitchen tiZANidine (ZANAFLEX) 4 MG tablet Take 0.5-1 tablets (2-4 mg total) by mouth every 8 (eight) hours as needed for muscle spasms.  . traMADol (ULTRAM) 50 MG tablet TAKE 1-2 TABS BY MOUTH EVERY 12 HOURS AS NEEDED FOR PAIN  . vitamin B-12 (CYANOCOBALAMIN) 100 MCG tablet Take 50 mcg by mouth daily.  . Vitamin D, Ergocalciferol, (DRISDOL) 50000 units CAPS capsule TAKE 1 CAPSULE (50,000 UNITS TOTAL) BY MOUTH ONCE A WEEK.   No facility-administered encounter medications on file as of 02/11/2017.          Objective:   Physical Exam  Constitutional: She is oriented to person, place, and time. She appears well-developed and well-nourished.  HENT:  Head: Normocephalic and atraumatic.  Cardiovascular: Normal rate and regular rhythm.   Murmur heard. 2/6 SEM  Pulmonary/Chest: Effort normal and breath sounds normal.  Musculoskeletal:  Trace pitting edema of the right lower tibia and 1+ over the top of the right foot. The edema is much higher on the left lower leg and foot. No significant erythema at this time. No breaks in the scanner signs of cellulitis.  Neurological: She is alert and oriented to person, place, and time.  Skin: Skin is warm and dry.  Psychiatric: She has a normal mood and affect. Her behavior is normal.        Assessment & Plan:  Bilateral lower extremity edema-at this point I think further workup is needed. Will evaluate further to rule out DVT, heart failure, thyroid disorder etc. Recent BMP was normal ruling out renal failure. We'll also check liver enzymes.We will also give her 40 mg of IM Lasix here in the office to bypass the gut and then have her increase her home furosemide dose to 40 mg daily in the morning.  Vit d def -s he would like refill. Due to recheck level

## 2017-02-11 NOTE — Patient Instructions (Signed)
Increase furosemide/Lasix to 40 mg in the morning. You can take 2 of the 20 mg tabs to have at home.

## 2017-02-12 ENCOUNTER — Ambulatory Visit (HOSPITAL_BASED_OUTPATIENT_CLINIC_OR_DEPARTMENT_OTHER): Admission: RE | Admit: 2017-02-12 | Payer: Medicare Other | Source: Ambulatory Visit

## 2017-02-12 ENCOUNTER — Ambulatory Visit (HOSPITAL_BASED_OUTPATIENT_CLINIC_OR_DEPARTMENT_OTHER)
Admission: RE | Admit: 2017-02-12 | Discharge: 2017-02-12 | Disposition: A | Discharge status: Home / Self Care | Payer: Medicare Other | Source: Ambulatory Visit | Attending: Family Medicine | Admitting: Family Medicine

## 2017-02-12 DIAGNOSIS — R7989 Other specified abnormal findings of blood chemistry: Secondary | ICD-10-CM | POA: Insufficient documentation

## 2017-02-12 DIAGNOSIS — R600 Localized edema: Secondary | ICD-10-CM | POA: Diagnosis not present

## 2017-02-12 LAB — HEPATIC FUNCTION PANEL
AG Ratio: 1.8 (calc) (ref 1.0–2.5)
ALT: 17 U/L (ref 6–29)
AST: 22 U/L (ref 10–35)
Albumin: 4 g/dL (ref 3.6–5.1)
Alkaline phosphatase (APISO): 69 U/L (ref 33–130)
Bilirubin, Direct: 0.1 mg/dL (ref 0.0–0.2)
Globulin: 2.2 g/dL (calc) (ref 1.9–3.7)
Indirect Bilirubin: 0.5 mg/dL (calc) (ref 0.2–1.2)
Total Bilirubin: 0.6 mg/dL (ref 0.2–1.2)
Total Protein: 6.2 g/dL (ref 6.1–8.1)

## 2017-02-12 LAB — D-DIMER, QUANTITATIVE: D-Dimer, Quant: 2 mcg/mL FEU — ABNORMAL HIGH (ref <0.50)

## 2017-02-12 LAB — BRAIN NATRIURETIC PEPTIDE: Brain Natriuretic Peptide: 34 pg/mL (ref <100)

## 2017-02-12 LAB — VITAMIN D 25 HYDROXY (VIT D DEFICIENCY, FRACTURES): Vit D, 25-Hydroxy: 53 ng/mL (ref 30–100)

## 2017-02-12 LAB — TSH: TSH: 0.6 mIU/L (ref 0.40–4.50)

## 2017-02-12 NOTE — Addendum Note (Signed)
Addended by: Nani Gasser D on: 02/12/2017 08:35 AM   Modules accepted: Orders

## 2017-02-15 ENCOUNTER — Other Ambulatory Visit: Payer: Self-pay | Admitting: Family Medicine

## 2017-02-15 ENCOUNTER — Other Ambulatory Visit: Payer: Self-pay | Admitting: *Deleted

## 2017-02-15 DIAGNOSIS — M7989 Other specified soft tissue disorders: Secondary | ICD-10-CM

## 2017-02-15 DIAGNOSIS — R899 Unspecified abnormal finding in specimens from other organs, systems and tissues: Secondary | ICD-10-CM

## 2017-02-16 ENCOUNTER — Other Ambulatory Visit: Payer: Self-pay

## 2017-02-16 DIAGNOSIS — R609 Edema, unspecified: Secondary | ICD-10-CM

## 2017-02-17 LAB — BASIC METABOLIC PANEL
BUN/Creatinine Ratio: 35 (calc) — ABNORMAL HIGH (ref 6–22)
BUN: 28 mg/dL — ABNORMAL HIGH (ref 7–25)
CO2: 30 mmol/L (ref 20–32)
Calcium: 10.1 mg/dL (ref 8.6–10.4)
Chloride: 101 mmol/L (ref 98–110)
Creat: 0.81 mg/dL (ref 0.60–0.93)
Glucose, Bld: 115 mg/dL — ABNORMAL HIGH (ref 65–99)
Potassium: 4.1 mmol/L (ref 3.5–5.3)
Sodium: 140 mmol/L (ref 135–146)

## 2017-02-17 NOTE — Progress Notes (Signed)
All labs are normal. 

## 2017-02-18 ENCOUNTER — Ambulatory Visit (INDEPENDENT_AMBULATORY_CARE_PROVIDER_SITE_OTHER): Payer: Medicare Other | Admitting: Family Medicine

## 2017-02-18 VITALS — BP 136/61 | HR 88 | Ht 60.0 in | Wt 204.0 lb

## 2017-02-18 DIAGNOSIS — B372 Candidiasis of skin and nail: Secondary | ICD-10-CM | POA: Diagnosis not present

## 2017-02-18 DIAGNOSIS — R600 Localized edema: Secondary | ICD-10-CM | POA: Diagnosis not present

## 2017-02-18 DIAGNOSIS — Z5181 Encounter for therapeutic drug level monitoring: Secondary | ICD-10-CM

## 2017-02-18 MED ORDER — FUROSEMIDE 40 MG PO TABS: 40.0000 mg | ORAL_TABLET | Freq: Every day | ORAL | 1 refills | Status: DC | PRN

## 2017-02-18 MED ORDER — NYSTATIN 100000 UNIT/GM EX POWD: 1.0000 g | Freq: Two times a day (BID) | CUTANEOUS | 6 refills | Status: DC

## 2017-02-18 NOTE — Patient Instructions (Signed)
Recheck potassium in 4 weeks.

## 2017-02-18 NOTE — Progress Notes (Signed)
   Subjective:    Patient ID: Joann Maddox, female    DOB: [birth year], 76 y.o.   MRN: [id number]  HPI Bilateral lower extremity edema-Joann Maddox is here today for 7-day follow-up.  When I last saw her we decided to put her on furosemide 40 mg daily instead of just taking an extra tab as needed and have her do that for a week and see how she is doing.  We also decided to refer her to vascular surgery after we ruled out a DVT as her d-dimer was mildly elevated.  Labs were normal ruling out thyroid disorder, heart failure, liver dysfunction etc. She says her swelling is a little better. Says her right leg is still painful.     We also checked her vitamin D level last saw her for approximately a week ago because of history of vitamin D deficiency.  Her vitamin D levels are back into the normal range which is fantastic.  Getting a little bit of recurrence of rash underneath the abdominal creases.  She says the nystatin powder has worked fantastic in the past and would like a refill on that.  Review of Systems     Objective:   Physical Exam  Constitutional: She is oriented to person, place, and time. She appears well-developed and well-nourished.  HENT:  Head: Normocephalic and atraumatic.  Eyes: Conjunctivae and EOM are normal.  Cardiovascular: Normal rate.   Pulmonary/Chest: Effort normal.  Musculoskeletal:  1+ pitting edema on the top of the feet bilaterally.  Legs looks better than before. No sore or ulcers. Some mild erythema.   Neurological: She is alert and oriented to person, place, and time.  Skin: Skin is dry. No pallor.  Psychiatric: She has a normal mood and affect. Her behavior is normal.  Vitals reviewed.         Assessment & Plan:  Bilateral lower extremity edema- she is down 6 lbs of fluid.  Much improved on the Lasix 40 mg daily.  New prescription sent to the pharmacy for the 40 mg tab.  Continue with 10 mg once a potassium daily and plan to recheck BMP in 1 month just to  make sure renal function and potassium look good.  He does have an appoint with vein and vascular coming up to take a look at the edema as well as her large varicosities.  Skin yeast infection-refilled nystatin powder.

## 2017-02-25 ENCOUNTER — Other Ambulatory Visit: Payer: Self-pay | Admitting: *Deleted

## 2017-02-25 MED ORDER — DULOXETINE HCL 60 MG PO CPEP: 60.0000 mg | ORAL_CAPSULE | Freq: Every day | ORAL | 2 refills | Status: DC

## 2017-02-25 MED ORDER — POTASSIUM CHLORIDE CRYS ER 10 MEQ PO TBCR: EXTENDED_RELEASE_TABLET | ORAL | 3 refills | Status: DC

## 2017-02-26 ENCOUNTER — Other Ambulatory Visit: Payer: Self-pay

## 2017-02-26 MED ORDER — MELOXICAM 15 MG PO TABS: ORAL_TABLET | ORAL | 0 refills | Status: DC

## 2017-03-17 ENCOUNTER — Ambulatory Visit (INDEPENDENT_AMBULATORY_CARE_PROVIDER_SITE_OTHER): Payer: Medicare Other | Admitting: Vascular Surgery

## 2017-03-17 ENCOUNTER — Ambulatory Visit (HOSPITAL_COMMUNITY)
Admission: RE | Admit: 2017-03-17 | Discharge: 2017-03-17 | Disposition: A | Discharge status: Home / Self Care | Payer: Medicare Other | Source: Ambulatory Visit | Attending: Vascular Surgery | Admitting: Vascular Surgery

## 2017-03-17 ENCOUNTER — Encounter: Payer: Self-pay | Admitting: Vascular Surgery

## 2017-03-17 VITALS — BP 136/66 | HR 77 | Temp 97.3°F | Resp 20 | Ht 60.0 in | Wt 204.0 lb

## 2017-03-17 DIAGNOSIS — I8393 Asymptomatic varicose veins of bilateral lower extremities: Secondary | ICD-10-CM | POA: Insufficient documentation

## 2017-03-17 DIAGNOSIS — R609 Edema, unspecified: Secondary | ICD-10-CM | POA: Insufficient documentation

## 2017-03-17 DIAGNOSIS — M7989 Other specified soft tissue disorders: Secondary | ICD-10-CM | POA: Diagnosis not present

## 2017-03-17 NOTE — Progress Notes (Signed)
Patient name: Joann Maddox MRN: [id number] DOB: 1940/12/17 Sex: female   REASON FOR CONSULT:    Bilateral lower extremity swelling.  The consult is requested by Dr. Linford Arnold.   HPI:   Joann Maddox is a pleasant 76 y.o. female, who was referred for evaluation of bilateral lower extremity swelling.  The patient has had some chronic bilateral lower extremity swelling, however about 6 weeks ago the swelling got significantly worse.  This was treated with diuretics.  In addition she elevated her legs and this helped significantly.  The swelling now is much better.  She does have a history of a DVT back in the late 80s but has had no further history of DVT.  She denies any significant pain associated with her leg swelling.  She denies any history of claudication, rest pain, or nonhealing ulcers.  Past Medical History:  Diagnosis Date  . Anxiety   . Headache(784.0)    migrains  . Heart murmur   . Hypothyroidism   . Rheumatoid arthritis(714.0)     Family History  Problem Relation Age of Onset  . Heart disease Father 27  . Diabetes Unknown        aunt   . Stroke Mother 11    SOCIAL HISTORY: Social History   Socioeconomic History  . Marital status: Married    Spouse name: Dorene Sorrow  . Number of children: 2  . Years of education: Not on file  . Highest education level: Not on file  Social Needs  . Financial resource strain: Not on file  . Food insecurity - worry: Not on file  . Food insecurity - inability: Not on file  . Transportation needs - medical: Not on file  . Transportation needs - non-medical: Not on file  Occupational History  . Occupation: Retired Runner, broadcasting/film/video    Comment: Corporate treasurer  Tobacco Use  . Smoking status: Never Smoker  . Smokeless tobacco: Never Used  Substance and Sexual Activity  . Alcohol use: No  . Drug use: Not on file  . Sexual activity: Not on file  Other Topics Concern  . Not on file  Social History Narrative   No regular exercise. 2  caffeine drinks per day.     Allergies  Allergen Reactions  . Iodine Other (See Comments)    Shook violently and passed out.  . Codeine Other (See Comments)    Hallucinations  . Lipitor [Atorvastatin] Other (See Comments)    Myalgias    Current Outpatient Medications  Medication Sig Dispense Refill  . AZELEX 20 % cream APPLY TOPICALL 2 TIMES A DAY (MORNING AND EVENING). APPLY AFTER SKIN IS WASHED AND PATTED DRY. 30 g 1  . butalbital-acetaminophen-caffeine (FIORICET, ESGIC) 50-325-40 MG tablet TAKE 1 TABLET BY MOUTH TWICE A DAY AS NEEDED 14 tablet 0  . DULoxetine (CYMBALTA) 60 MG capsule Take 1 capsule (60 mg total) by mouth daily. 90 capsule 2  . folic acid (FOLVITE) 1 MG tablet TAKE 1 TABLET BY MOUTH EVERY DAY 90 tablet 2  . furosemide (LASIX) 40 MG tablet Take 1 tablet (40 mg total) by mouth daily as needed. 90 tablet 1  . hydroxychloroquine (PLAQUENIL) 200 MG tablet Take 200 mg by mouth daily.    Marland Kitchen levothyroxine (SYNTHROID, LEVOTHROID) 25 MCG tablet TAKE 1 TABLET (25 MCG TOTAL) BY MOUTH DAILY. 90 tablet 3  . lisinopril-hydrochlorothiazide (PRINZIDE,ZESTORETIC) 20-25 MG tablet TAKE 1 TABLET BY MOUTH EVERY DAY 90 tablet 1  . meloxicam (MOBIC) 15 MG  tablet TAKE ONE TABLET BY MOUTH EACH AM WITH BREAKFAST AS NEEDED FOR PAIN 90 tablet 0  . metFORMIN (GLUCOPHAGE) 500 MG tablet TAKE 1 TABLET BY MOUTH 2 TIMES DAILY WITH A MEAL. 180 tablet 1  . methotrexate 50 MG/2ML injection 1 ML WEEKLY INJECTION 30 DAYS  3  . Multiple Vitamin (MULTIVITAMIN) tablet Take 1 tablet by mouth daily.    Marland Kitchen MYRBETRIQ 25 MG TB24 tablet TAKE 1 TABLET BY MOUTH EVERY DAY 30 tablet 3  . nystatin (NYSTATIN) powder Apply 1 g topically 2 (two) times daily. X 3 weeks. 60 g 6  . potassium chloride (KLOR-CON M10) 10 MEQ tablet TAKE 1 TABLET (10 MEQ TOTAL) BY MOUTH 2 (TWO) TIMES DAILY. 180 tablet 3  . PROAIR HFA 108 (90 Base) MCG/ACT inhaler Inhale 2 puffs into the lungs every 4 (four) hours as needed.    . rosuvastatin  (CRESTOR) 10 MG tablet Take 1 tablet (10 mg total) by mouth daily. 30 tablet 11  . senna (SENOKOT) 8.6 MG tablet Take 1 tablet by mouth as needed.     Marland Kitchen tiZANidine (ZANAFLEX) 4 MG tablet Take 0.5-1 tablets (2-4 mg total) by mouth every 8 (eight) hours as needed for muscle spasms. 30 tablet 0  . traMADol (ULTRAM) 50 MG tablet TAKE 1-2 TABS BY MOUTH EVERY 12 HOURS AS NEEDED FOR PAIN 120 tablet 1  . vitamin B-12 (CYANOCOBALAMIN) 100 MCG tablet Take 50 mcg by mouth daily.    . Vitamin D, Ergocalciferol, (DRISDOL) 50000 units CAPS capsule TAKE 1 CAPSULE (50,000 UNITS TOTAL) BY MOUTH ONCE A WEEK. 12 capsule 2  . hydrocortisone valerate cream (WESTCORT) 0.2 % Apply 1 application topically daily as needed. 45 g 0   No current facility-administered medications for this visit.     REVIEW OF SYSTEMS:  [X]  denotes positive finding, [ ]  denotes negative finding Cardiac  Comments:  Chest pain or chest pressure:    Shortness of breath upon exertion:    Short of breath when lying flat:    Irregular heart rhythm:        Vascular    Pain in calf, thigh, or hip brought on by ambulation:    Pain in feet at night that wakes you up from your sleep:     Blood clot in your veins:    Leg swelling:  X       Pulmonary    Oxygen at home:    Productive cough:     Wheezing:         Neurologic    Sudden weakness in arms or legs:     Sudden numbness in arms or legs:     Sudden onset of difficulty speaking or slurred speech:    Temporary loss of vision in one eye:     Problems with dizziness:         Gastrointestinal    Blood in stool:     Vomited blood:         Genitourinary    Burning when urinating:     Blood in urine:        Psychiatric    Major depression:         Hematologic    Bleeding problems:    Problems with blood clotting too easily:        Skin    Rashes or ulcers:        Constitutional    Fever or chills:     PHYSICAL EXAM:   Vitals:  03/17/17 1415  BP: 136/66  Pulse: 77    Resp: 20  Temp: (!) 97.3 F (36.3 C)  TempSrc: Oral  SpO2: 100%  Weight: 204 lb (92.5 kg)  Height: 5' (1.524 m)    GENERAL: The patient is a well-nourished female, in no acute distress. The vital signs are documented above. CARDIAC: There is a regular rate and rhythm.  VASCULAR: I do not detect carotid bruits. She has palpable dorsalis pedis pulses bilaterally. She has mild bilateral lower extremity swelling. She has reticular veins and telangiectasias in both lower extremities. She has mild hyperpigmentation. PULMONARY: There is good air exchange bilaterally without wheezing or rales. ABDOMEN: Soft and non-tender with normal pitched bowel sounds.  MUSCULOSKELETAL: There are no major deformities or cyanosis. NEUROLOGIC: No focal weakness or paresthesias are detected. SKIN: There are no ulcers or rashes noted. PSYCHIATRIC: The patient has a normal affect.  DATA:    VENOUS DUPLEX: I have been dependently interpreted her bilateral lower extremity venous duplex scan.  On the right side there is no evidence of DVT or superficial thrombophlebitis.  There is deep venous reflux involving the common femoral vein.  There is superficial venous reflux involving the great saphenous vein.  The right great saphenous vein is not especially dilated.  On the left side there is no evidence of DVT or superficial thrombophlebitis.  There is deep venous reflux involving the common femoral vein and popliteal vein.  There is superficial venous reflux involving the left great saphenous vein and small saphenous vein.  The vein is not especially dilated.  MEDICAL ISSUES:   CHRONIC VENOUS INSUFFICIENCY: Based on her physical exam and duplex study, she does have evidence of chronic venous insufficiency.  We have discussed the importance of intermittent leg elevation and the proper positioning for this.  In addition I have given her a prescription for knee-high compression stockings with a gradient of 15-20  mmHg.  I am encouraged her to avoid prolonged sitting and standing.  I have encouraged her to exercise also.  I also feel that water aerobics is very helpful for patients with venous disease.  I be happy to see her back in the future if her symptoms progress.  However she understands that this is a chronic problem and that the mainstay of treatment is elevation and compression.  Waverly Ferrari [facility] [phone]

## 2017-03-22 ENCOUNTER — Ambulatory Visit: Payer: Medicare Other | Admitting: Family Medicine

## 2017-03-31 ENCOUNTER — Other Ambulatory Visit: Payer: Self-pay | Admitting: *Deleted

## 2017-03-31 DIAGNOSIS — E038 Other specified hypothyroidism: Secondary | ICD-10-CM

## 2017-03-31 MED ORDER — LEVOTHYROXINE SODIUM 25 MCG PO TABS: 25.0000 ug | ORAL_TABLET | Freq: Every day | ORAL | 3 refills | Status: DC

## 2017-04-19 ENCOUNTER — Other Ambulatory Visit: Payer: Self-pay | Admitting: Family Medicine

## 2017-04-24 ENCOUNTER — Other Ambulatory Visit: Payer: Self-pay | Admitting: Family Medicine

## 2017-04-26 NOTE — Telephone Encounter (Signed)
Call pt: she needs to get her plaquenil from her rheumatologist.

## 2017-04-29 ENCOUNTER — Encounter: Payer: Self-pay | Admitting: Family Medicine

## 2017-04-29 ENCOUNTER — Ambulatory Visit (INDEPENDENT_AMBULATORY_CARE_PROVIDER_SITE_OTHER): Payer: Medicare Other | Admitting: Family Medicine

## 2017-04-29 VITALS — BP 138/52 | HR 80 | Ht 60.0 in | Wt 206.0 lb

## 2017-04-29 DIAGNOSIS — R400 Somnolence: Secondary | ICD-10-CM | POA: Diagnosis not present

## 2017-04-29 DIAGNOSIS — E119 Type 2 diabetes mellitus without complications: Secondary | ICD-10-CM | POA: Diagnosis not present

## 2017-04-29 DIAGNOSIS — I872 Venous insufficiency (chronic) (peripheral): Secondary | ICD-10-CM | POA: Diagnosis not present

## 2017-04-29 DIAGNOSIS — F5101 Primary insomnia: Secondary | ICD-10-CM | POA: Diagnosis not present

## 2017-04-29 DIAGNOSIS — Z6841 Body Mass Index (BMI) 40.0 and over, adult: Secondary | ICD-10-CM

## 2017-04-29 LAB — POCT GLYCOSYLATED HEMOGLOBIN (HGB A1C): Hemoglobin A1C: 5.7

## 2017-04-29 MED ORDER — AMBULATORY NON FORMULARY MEDICATION: 0 refills | Status: DC

## 2017-04-29 NOTE — Progress Notes (Signed)
Subjective:    CC: DM  HPI: Diabetes - no hypoglycemic events. No wounds or sores that are not healing well. No increased thirst or urination. Checking glucose at home. Taking medications as prescribed without any side effects.  She did follow-up with Dr. Durwin Nora, vascular surgeon for her chronic lower extremity edema and he did confirm a diagnosis of chronic venous insufficiency.  He recommended treatment with elevation, compression stockings, avoiding sitting or standing for long periods of time, and regular exercise to improve blood flow.  She is concerned because she says the swelling never goes away even when she wears the stockings elevates her legs etc.  There is always some chronic swelling there though it does get a little worse by the end of the day.  Wants to discuss sleep issues.  Her husband says she has not been sleeping well for quite some time.  In fact most nights she does not fall asleep for about 2 or 3 AM and then typically will wake up between 8 and 9 AM but sometimes will even sleep until about noon.  If she does wake up around 8 or 9 AM then a lot of nap may be an hour or 2 later because she wakes up still feeling just groggy.  She and her husband deny any excessive snoring.  She does wake up with headaches frequently.  rheumatoid arthritis she is been following with Dr. Dierdre Forth.  Unfortunately they have tried several medications and really have not found anything significant to provide significant relief for her.  She think she is going to discontinue the Plaquenil.  She has been working with a Land on her neck and her shoulder recently.  Also felt a little bit more down and sad recently.  She has a brother who is critically ill and likely to pass away soon and some other personal issues that are currently stressing her.  Past medical history, Surgical history, Family history not pertinant except as noted below, Social history, Allergies, and medications have been entered  into the medical record, reviewed, and corrections made.   Review of Systems: No fevers, chills, night sweats, weight loss, chest pain, or shortness of breath.   Objective:    General: Well Developed, well nourished, and in no acute distress.  Neuro: Alert and oriented x3, extra-ocular muscles intact, sensation grossly intact.  HEENT: Normocephalic, atraumatic  Skin: Warm and dry, no rashes. Cardiac: Regular rate and rhythm, no murmurs rubs or gallops, no lower extremity edema.  Respiratory: Clear to auscultation bilaterally. Not using accessory muscles, speaking in full sentences.   Impression and Recommendations:    DM- A1C of 5.7.  Well controlled. Continue current regimen. Follow up in  3 months.   Chronic venous insufficiency-we discussed different options.  She may have a combination of some lymphedema on top of chronic venous insufficiency since she has some chronic swelling that never goes away but it does get slightly worse as the day progresses.  We discussed continuing to wear the compression stockings consistently.  We also discussed working on sleep quality etc.  Certainly an option would be to refer her to the lymphedema clinic as well for treatments but this will be intensive in regards to frequency of visits etc.  She wants to hold off on that.  Sleep issues-we discussed slowly moving back her sleep cycle.  She can start by just making sure that she is getting to bed around 2 AM consistently for at least 7-10 days and then moving  that back to 1:45 for 7-10 days and so on and so on until she can get her bedtime closer to midnight which is ultimately what her goal is.  We also discussed trying to minimize daytime naps and just trying to get up and move and be active.  I am concerned that she could also be expensing some hypoxemia.  I do think overall she is high risk for this.  She is waking up with headaches and she is obese.  Will order overnight pulse oximetry testing for further  evaluation.  Positive depression screen-she has several family issues that are going on right now that are worrying her and stressing her.  She is to continue her Cymbalta for now.  RA - she thinks she is going to discontinue the Plaquenil.  May even discontinue her methotrexate as well.

## 2017-04-30 ENCOUNTER — Other Ambulatory Visit: Payer: Self-pay | Admitting: Family Medicine

## 2017-04-30 ENCOUNTER — Other Ambulatory Visit: Payer: Self-pay | Admitting: *Deleted

## 2017-04-30 LAB — BASIC METABOLIC PANEL WITH GFR
BUN/Creatinine Ratio: 33 (calc) — ABNORMAL HIGH (ref 6–22)
BUN: 26 mg/dL — ABNORMAL HIGH (ref 7–25)
CO2: 32 mmol/L (ref 20–32)
Calcium: 10.2 mg/dL (ref 8.6–10.4)
Chloride: 104 mmol/L (ref 98–110)
Creat: 0.8 mg/dL (ref 0.60–0.93)
GFR, Est African American: 84 mL/min/{1.73_m2} (ref >=60)
GFR, Est Non African American: 73 mL/min/{1.73_m2} (ref >=60)
Glucose, Bld: 109 mg/dL — ABNORMAL HIGH (ref 65–99)
Potassium: 4.4 mmol/L (ref 3.5–5.3)
Sodium: 144 mmol/L (ref 135–146)

## 2017-04-30 NOTE — Progress Notes (Signed)
All labs are normal. 

## 2017-05-12 ENCOUNTER — Other Ambulatory Visit: Payer: Self-pay | Admitting: Family Medicine

## 2017-05-21 ENCOUNTER — Other Ambulatory Visit: Payer: Self-pay | Admitting: Family Medicine

## 2017-05-27 ENCOUNTER — Telehealth: Payer: Self-pay | Admitting: Family Medicine

## 2017-05-27 DIAGNOSIS — M542 Cervicalgia: Secondary | ICD-10-CM

## 2017-05-27 DIAGNOSIS — M47812 Spondylosis without myelopathy or radiculopathy, cervical region: Secondary | ICD-10-CM

## 2017-05-27 DIAGNOSIS — M25512 Pain in left shoulder: Secondary | ICD-10-CM

## 2017-05-27 NOTE — Telephone Encounter (Signed)
Patient called advised she spoke with you in prevs appointment and she is requesting to get an order for physical therapy and her cell phone is the best way to reach her. Thanks

## 2017-05-28 NOTE — Telephone Encounter (Signed)
Referral placed Pt advised 

## 2017-05-28 NOTE — Telephone Encounter (Signed)
Ok for referral for PT.

## 2017-05-30 ENCOUNTER — Other Ambulatory Visit: Payer: Self-pay | Admitting: Family Medicine

## 2017-05-30 ENCOUNTER — Other Ambulatory Visit: Payer: Self-pay | Admitting: Sports Medicine

## 2017-06-02 ENCOUNTER — Ambulatory Visit: Payer: Medicare Other | Admitting: Rehabilitative and Restorative Service Providers"

## 2017-06-02 ENCOUNTER — Encounter: Payer: Self-pay | Admitting: Rehabilitative and Restorative Service Providers"

## 2017-06-02 DIAGNOSIS — R531 Weakness: Secondary | ICD-10-CM | POA: Diagnosis not present

## 2017-06-02 DIAGNOSIS — M25512 Pain in left shoulder: Secondary | ICD-10-CM

## 2017-06-02 DIAGNOSIS — M542 Cervicalgia: Secondary | ICD-10-CM | POA: Diagnosis not present

## 2017-06-02 DIAGNOSIS — R29898 Other symptoms and signs involving the musculoskeletal system: Secondary | ICD-10-CM

## 2017-06-02 DIAGNOSIS — G8929 Other chronic pain: Secondary | ICD-10-CM | POA: Diagnosis not present

## 2017-06-02 NOTE — Therapy (Addendum)
[facility] [place] [address] [place], [place], [address] Phone: [phone]   Fax:  [phone]  Physical Therapy Evaluation  Patient Details  Name: Joann Maddox MRN: [id number] Date of Birth: 1940/07/30 Referring Provider: Dr Linford Arnold   Encounter Date: 06/02/2017  PT End of Session - 06/02/17 1056    Visit Number  1    Number of Visits  18    Date for PT Re-Evaluation  08/25/17    PT Start Time  1057    PT Stop Time  1204    PT Time Calculation (min)  67 min    Activity Tolerance  Patient tolerated treatment well       Past Medical History:  Diagnosis Date  . Anxiety   . Headache(784.0)    migrains  . Heart murmur   . Hypothyroidism   . Rheumatoid arthritis(714.0)     Past Surgical History:  Procedure Laterality Date  . ABDOMINAL HYSTERECTOMY    . APPENDECTOMY    . CHOLECYSTECTOMY    . REPLACEMENT TOTAL KNEE BILATERAL    . TONSILLECTOMY     as  a child  . TOTAL HIP ARTHROPLASTY     right and left    There were no vitals filed for this visit.   Subjective Assessment - 06/02/17 1057    Subjective  Patient reports increased pain in the neck and Lt shoulder in the past few months. She has difficulty with turning head side to side; lifting Lt UE;reaching; lifting; using Lt UE; sleeping(sleeps sitting in a chair). She has been having chiropractic care with limited change. Symptos continue to increase.      Pertinent History  multiple joint arthritis; bilat hip replacements and bilat knee replacements in the past 12 years - most recent joint replacement ~ 4-5 years ago; LBP; DDD cervical spine     How long can you sit comfortably?  recliner 1-2 hours with pillow at neck; straight chair ~45 -60 min     How long can you stand comfortably?  3-5 min     How long can you walk comfortably?  not at all     Diagnostic tests  xrays     Patient Stated Goals  eliminate the pain in the neck and loosen the shoulder up      Currently in Pain?  Yes    Pain Score  7     Pain Location  Neck    Pain Orientation  Left    Pain Descriptors / Indicators  Sharp;Aching;Dull occasional sharp pains     Pain Type  Chronic pain    Pain Radiating Towards  into the Lt shoulder     Pain Onset  More than a month ago    Pain Frequency  Constant    Aggravating Factors   turning head; weather changes; ; worse in the morning and end of the day is worse    Pain Relieving Factors  DN: massage; heat     Multiple Pain Sites  Yes    Pain Score  6    Pain Location  Shoulder    Pain Orientation  Left    Pain Descriptors / Indicators  Aching;Tightness;Sharp certain movements cause sharp pain     Pain Type  Chronic pain    Pain Onset  More than a month ago    Pain Frequency  Intermittent    Aggravating Factors   raising Lt arm; reaching; any lifting     Pain  Relieving Factors  DN; TENS; ice; heat; meds          [facility] PT Assessment - 06/02/17 0001      Assessment   Medical Diagnosis  cervical dysfunction; Lt shoulder pain     Referring Provider  Dr Linford Arnold    Onset Date/Surgical Date  12/26/16 symptoms chronic in nature for several years     Hand Dominance  Right    Next MD Visit  07/06/17    Prior Therapy  yes 4/18 to 7/18 here      Precautions   Precautions  None      Balance Screen   Has the patient fallen in the past 6 months  Yes    How many times?  3    Has the patient had a decrease in activity level because of a fear of falling?   No    Is the patient reluctant to leave their home because of a fear of falling?   No      Home Environment   Additional Comments  single level home - level entry       Prior Function   Level of Independence  Independent with basic ADLs    Vocation  Retired    Press photographer - retired  1994     Leisure  sedentary - some light cooking; Pharmacologist; TV; computer       Observation/Other Assessments   Focus on Therapeutic Outcomes (FOTO)   61% limitation       Sensation    Additional Comments  WFL's per pt report       Posture/Postural Control   Posture Comments  head forward; shoudlers rounded and elevated; scaopulae abducted and rotated along the thoracic wall; head of the humerus anterior in orientation      AROM   Overall AROM   -- shoulder ROM assessed w/ pt standing; cervical sitting     Right Shoulder Extension  67 Degrees    Right Shoulder Flexion  121 Degrees    Right Shoulder ABduction  111 Degrees    Right Shoulder Internal Rotation  53 Degrees    Right Shoulder External Rotation  57 Degrees    Left Shoulder Extension  53 Degrees    Left Shoulder Flexion  51 Degrees    Left Shoulder ABduction  53 Degrees    Left Shoulder Internal Rotation  43 Degrees    Left Shoulder External Rotation  22 Degrees    Cervical Flexion  52    Cervical Extension  13    Cervical - Right Side Bend  35    Cervical - Left Side Bend  27    Cervical - Right Rotation  47    Cervical - Left Rotation  39      Strength   Overall Strength  -- pain Lt shoulder with attempt to test resistively - NT     Right Shoulder Flexion  4+/5    Right Shoulder Extension  4+/5    Right Shoulder ABduction  4/5    Right Shoulder Internal Rotation  4/5    Right Shoulder External Rotation  4/5      Palpation   Palpation comment  muscular tightness Lt > Rt upper quarter including upper traps; leveator; pecs; ant/lat/posterior cervical musculature; anterior deltoid; biceps              Objective measurements completed on examination: See above findings.      [facility] Adult PT Treatment/Exercise -  06/02/17 0001      Neck Exercises: Seated   Neck Retraction  5 reps;10 secs      Moist Heat Therapy   Number Minutes Moist Heat  17 Minutes    Moist Heat Location  Cervical;Shoulder      Electrical Stimulation   Electrical Stimulation Location  Lt lateral cervical; Lt shoulder     Electrical Stimulation Action  IFC    Electrical Stimulation Parameters  to tolerance     Electrical Stimulation Goals  Pain;Tone      Manual Therapy   Manual therapy comments  pt sitting     Soft tissue mobilization  Lt lateral ad posterior cervical musculature; upper trap; leveator; posterior deltoid;        Trigger Point Dry Needling - 06/02/17 1200    Consent Given?  Yes    Education Handout Provided  Yes    Muscles Treated Upper Body  -- Lt cervical and posterior deltoid w/ estim     Upper Trapezius Response  Palpable increased muscle length    Levator Scapulae Response  Palpable increased muscle length    Longissimus Response  Palpable increased muscle length cervical            PT Education - 06/02/17 1201    Education provided  Yes    Education Details  HEP DN     Person(s) Educated  Patient    Methods  Explanation;Demonstration;Tactile cues;Verbal cues;Handout    Comprehension  Verbalized understanding;Returned demonstration;Verbal cues required;Tactile cues required       See addendum for short term goals.    PT Long Term Goals - 06/02/17 1202      PT LONG TERM GOAL #1   Title  Increase AROM cervical spine by 5-10 degrees in all planes 08/25/17    Time  12    Period  Weeks    Status  New      PT LONG TERM GOAL #2   Title  Increase AROM Lt shoudler by 5-15 degrees in flexion and abduction 08/25/17    Time  12    Period  Weeks    Status  New      PT LONG TERM GOAL #3   Title  Increase strength Lt/Rt shoulders allowing patient to use UE's for more functional activities including combing hair, washing face, etc 08/25/17     Time  12      PT LONG TERM GOAL #4   Title  patient reports performing some exercise for neck and shoulders at least once daily 08/25/17    Time  12    Period  Weeks    Status  New      PT LONG TERM GOAL #5   Title  Independent in HEP 08/25/17    Time  12    Period  Weeks    Status  New      PT LONG TERM GOAL #6   Title  Improve FOTO to </= 50% limitation 08/25/17    Time  12    Period  Weeks    Status  New              Plan - 06/02/17 1225    Clinical Impression Statement  Joann Maddox presents to clinic with increased pain in the Lt cervical spine and Lt shoulder girdle. Symptoms have increased in the past 5 months with patient reporting difficulty with all functional activities as well as sleeping. She has poor posture and alignment with forward posture  flexed at trunk and hips; limited cervical and shoulder ROM/mobility; decreased functional strength Lt UE; muscular tightness through the cervical spine and Lt shoulder girdle; pain on a daily basis. Patient will benefit from PT to address problems identified.     Clinical Presentation  Evolving    Clinical Decision Making  Low    Rehab Potential  Good    PT Frequency  2x / week    PT Duration  12 weeks    PT Treatment/Interventions  Patient/family education;ADLs/Self Care Home Management;Cryotherapy;Electrical Stimulation;Iontophoresis 4mg /ml Dexamethasone;Moist Heat;Ultrasound;Dry needling;Manual techniques;Therapeutic activities;Therapeutic exercise    PT Next Visit Plan  postural exercises; gentle stretching; pulleys for UE ROM; posterior shoulder girdle strengthening; manual therapy; DN; modalities as indicated     Consulted and Agree with Plan of Care  Patient       Patient will benefit from skilled therapeutic intervention in order to improve the following deficits and impairments:  Postural dysfunction, Improper body mechanics, Pain, Increased fascial restricitons, Increased muscle spasms, Decreased range of motion, Decreased mobility, Decreased strength, Impaired UE functional use, Decreased activity tolerance  Visit Diagnosis: Cervicalgia - Plan: PT plan of care cert/re-cert  Chronic left shoulder pain - Plan: PT plan of care cert/re-cert  Other symptoms and signs involving the musculoskeletal system - Plan: PT plan of care cert/re-cert  Weakness generalized - Plan: PT plan of care cert/re-cert     Problem List Patient Active  Problem List   Diagnosis Date Noted  . Chronic venous insufficiency 04/29/2017  . Cervical spondylosis 07/20/2016  . 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HCC) 02/12/2015  . Type 2 diabetes mellitus without complication (HCC) 08/10/2014  . Chronic constipation 02/19/2014  . Rosacea 11/06/2013  . GAD (generalized anxiety disorder) 06/08/2013  . Essential hypertension, benign 05/04/2013  . Rheumatoid arthritis (HCC) 05/04/2013  . Migraine with aura 05/04/2013  . Palpitations 05/04/2013  . Murmur, heart 05/04/2013  . Hyperlipidemia 05/04/2013  . Hypothyroidism 05/04/2013    [name] 07/02/2013 PT, MPH  06/02/2017, 12:31 PM  [facility] [address] [place], [place], [place] Phone: [phone]   Fax:  [phone]  Name: Joann Maddox MRN: Lowry Ram Date of Birth: 09-07-1940

## 2017-06-02 NOTE — Patient Instructions (Signed)
Axial Extension (Chin Tuck)    Pull chin in and lengthen back of neck. Hold __10__ seconds while counting out loud. Repeat __5__ times. Do _several ___ sessions per day.   Trigger Point Dry Needling  . What is Trigger Point Dry Needling (DN)? o DN is a physical therapy technique used to treat muscle pain and dysfunction. Specifically, DN helps deactivate muscle trigger points (muscle knots).  o A thin filiform needle is used to penetrate the skin and stimulate the underlying trigger point. The goal is for a local twitch response (LTR) to occur and for the trigger point to relax. No medication of any kind is injected during the procedure.   . What Does Trigger Point Dry Needling Feel Like?  o The procedure feels different for each individual patient. Some patients report that they do not actually feel the needle enter the skin and overall the process is not painful. Very mild bleeding may occur. However, many patients feel a deep cramping in the muscle in which the needle was inserted. This is the local twitch response.   Marland Kitchen How Will I feel after the treatment? o Soreness is normal, and the onset of soreness may not occur for a few hours. Typically this soreness does not last longer than two days.  o Bruising is uncommon, however; ice can be used to decrease any possible bruising.  o In rare cases feeling tired or nauseous after the treatment is normal. In addition, your symptoms may get worse before they get better, this period will typically not last longer than 24 hours.   . What Can I do After My Treatment? o Increase your hydration by drinking more water for the next 24 hours. o You may place ice or heat on the areas treated that have become sore, however, do not use heat on inflamed or bruised areas. Heat often brings more relief post needling. o You can continue your regular activities, but vigorous activity is not recommended initially after the treatment for 24 hours. o DN is best  combined with other physical therapy such as strengthening, stretching, and other therapies.

## 2017-06-05 ENCOUNTER — Other Ambulatory Visit: Payer: Self-pay | Admitting: Family Medicine

## 2017-06-07 ENCOUNTER — Ambulatory Visit: Payer: Medicare Other | Admitting: Rehabilitative and Restorative Service Providers"

## 2017-06-07 DIAGNOSIS — G8929 Other chronic pain: Secondary | ICD-10-CM | POA: Diagnosis not present

## 2017-06-07 DIAGNOSIS — M25512 Pain in left shoulder: Secondary | ICD-10-CM

## 2017-06-07 DIAGNOSIS — R531 Weakness: Secondary | ICD-10-CM | POA: Diagnosis not present

## 2017-06-07 DIAGNOSIS — R29898 Other symptoms and signs involving the musculoskeletal system: Secondary | ICD-10-CM

## 2017-06-07 DIAGNOSIS — M542 Cervicalgia: Secondary | ICD-10-CM

## 2017-06-07 NOTE — Therapy (Signed)
[facility] [place] [address] [place], [place], [address] Phone: [phone]   Fax:  [phone]  Physical Therapy Treatment  Patient Details  Name: Joann Maddox MRN: [id number] Date of Birth: [birth year] Referring Provider: Dr Linford Arnold   Encounter Date: 06/07/2017  PT End of Session - 06/07/17 1701    Visit Number  2    Number of Visits  18    Date for PT Re-Evaluation  08/25/17    PT Start Time  1518    PT Stop Time  1616    PT Time Calculation (min)  58 min    Activity Tolerance  Patient tolerated treatment well       Past Medical History:  Diagnosis Date  . Anxiety   . Headache(784.0)    migrains  . Heart murmur   . Hypothyroidism   . Rheumatoid arthritis(714.0)     Past Surgical History:  Procedure Laterality Date  . ABDOMINAL HYSTERECTOMY    . APPENDECTOMY    . CHOLECYSTECTOMY    . REPLACEMENT TOTAL KNEE BILATERAL    . TONSILLECTOMY     as  a child  . TOTAL HIP ARTHROPLASTY     right and left    There were no vitals filed for this visit.  Subjective Assessment - 06/07/17 1527    Subjective  Patient reports that she has had some increase in pain in the neck and shoulder with the rainy gloomy weather. She felt good after the last visit for several days with less pain .     Currently in Pain?  Yes    Pain Score  7     Pain Location  Neck    Pain Orientation  Left    Pain Descriptors / Indicators  Aching;Dull;Sharp    Pain Score  8    Pain Location  Shoulder    Pain Orientation  Left    Pain Descriptors / Indicators  Aching;Dull;Tightness                      [facility] Adult PT Treatment/Exercise - 06/07/17 0001      Neck Exercises: Seated   Neck Retraction  5 reps;10 secs      Shoulder Exercises: Seated   Extension  Strengthening;Both;20 reps;Theraband    Theraband Level (Shoulder Extension)  Level 1 (Yellow)    Retraction  Strengthening;Both;20 reps;Theraband    Theraband Level  (Shoulder Retraction)  Level 1 (Yellow)    Row  Strengthening;Both;20 reps;Theraband    Theraband Level (Shoulder Row)  Level 1 (Yellow)    Other Seated Exercises  axial extension 15 sec hold x 5       Shoulder Exercises: Pulleys   Flexion  -- 10 sec hold x 10 reps     ABduction  -- 10 sec hold x 10 reps       Shoulder Exercises: Stretch   Other Shoulder Stretches  standing back to wall - pressing out with hips with spine straight 10 sec x 10 reps       Moist Heat Therapy   Number Minutes Moist Heat  20 Minutes    Moist Heat Location  Cervical;Shoulder      Electrical Stimulation   Electrical Stimulation Location  Lt lateral cervical; Lt shoulder     Electrical Stimulation Action  IFC    Electrical Stimulation Parameters  to tolerance    Electrical Stimulation Goals  Pain;Tone      Manual Therapy  Manual therapy comments  pt sitting     Soft tissue mobilization  Lt lateral and posterior cervical musculature; upper trap; leveator; posterior deltoid;        Trigger Point Dry Needling - 06/07/17 1658    Consent Given?  Yes    Muscles Treated Upper Body  -- Lt cervical and posterior deltoid     Upper Trapezius Response  Palpable increased muscle length    Levator Scapulae Response  Palpable increased muscle length    Longissimus Response  Palpable increased muscle length cervical              PT Short Term Goals - 06/07/17 1526      PT SHORT TERM GOAL #1   Title  Education re-sleeping position with patient verbalizing understanding of importance of head support when in recliner 06/29/17    Time  4    Period  Weeks    Status  On-going      PT SHORT TERM GOAL #2   Title  Initiate UE ROM and strengthening exercises with patient to report at least 15 min of exercise each day 06/29/17    Time  4    Period  Weeks    Status  On-going        PT Long Term Goals - 06/07/17 1525      PT LONG TERM GOAL #1   Title  Increase AROM cervical spine by 5-10 degrees in all planes  08/25/17    Time  12    Period  Weeks    Status  On-going      PT LONG TERM GOAL #2   Title  Increase AROM Lt shoudler by 5-15 degrees in flexion and abduction 08/25/17    Time  12    Period  Weeks    Status  On-going      PT LONG TERM GOAL #3   Title  Increase strength Lt/Rt shoulders allowing patient to use UE's for more functional activities including combing hair, washing face, etc 08/25/17     Time  12    Period  Weeks    Status  On-going      PT LONG TERM GOAL #4   Title  patient reports performing some exercise for neck and shoulders at least once daily 08/25/17    Time  12    Period  Weeks    Status  On-going      PT LONG TERM GOAL #5   Title  Independent in HEP 08/25/17    Time  12    Period  Weeks    Status  On-going      PT LONG TERM GOAL #6   Title  Improve FOTO to </= 50% limitation 08/25/17    Time  12    Period  Weeks    Status  On-going            Plan - 06/07/17 1533    Clinical Impression Statement  Persistent pain which is weather related per pt report. patient added pulleys and postural strengthening with yellow TB today. She will be out of town for the next two weeks. She states that she will continue with HEP while away.     Rehab Potential  Good    Clinical Impairments Affecting Rehab Potential  multiple co-morbities     PT Frequency  2x / week    PT Duration  12 weeks    PT Treatment/Interventions  Patient/family education;ADLs/Self Care Home Management;Cryotherapy;Electrical Stimulation;Iontophoresis  4mg /ml Dexamethasone;Moist Heat;Ultrasound;Dry needling;Manual techniques;Therapeutic activities;Therapeutic exercise    PT Next Visit Plan  postural exercises; gentle stretching; pulleys for UE ROM; posterior shoulder girdle strengthening; manual therapy; DN; modalities as indicated     Consulted and Agree with Plan of Care  Patient       Patient will benefit from skilled therapeutic intervention in order to improve the following deficits and  impairments:  Postural dysfunction, Improper body mechanics, Pain, Increased fascial restricitons, Increased muscle spasms, Decreased range of motion, Decreased mobility, Decreased strength, Impaired UE functional use, Decreased activity tolerance  Visit Diagnosis: Cervicalgia  Chronic left shoulder pain  Other symptoms and signs involving the musculoskeletal system  Weakness generalized     Problem List Patient Active Problem List   Diagnosis Date Noted  . Chronic venous insufficiency 04/29/2017  . Cervical spondylosis 07/20/2016  . 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HCC) 02/12/2015  . Type 2 diabetes mellitus without complication (HCC) 08/10/2014  . Chronic constipation 02/19/2014  . Rosacea 11/06/2013  . GAD (generalized anxiety disorder) 06/08/2013  . Essential hypertension, benign 05/04/2013  . Rheumatoid arthritis (HCC) 05/04/2013  . Migraine with aura 05/04/2013  . Palpitations 05/04/2013  . Murmur, heart 05/04/2013  . Hyperlipidemia 05/04/2013  . Hypothyroidism 05/04/2013    Syndey Jaskolski 07/02/2013 PT,MPH  06/07/2017, 5:11 PM  [facility] [address] [place], [place], [place] Phone: [phone]   Fax:  [phone]  Name: Joann Maddox MRN: Lowry Ram Date of Birth: 01-29-1941

## 2017-07-01 ENCOUNTER — Ambulatory Visit (INDEPENDENT_AMBULATORY_CARE_PROVIDER_SITE_OTHER): Payer: Medicare Other | Admitting: Rehabilitative and Restorative Service Providers"

## 2017-07-01 ENCOUNTER — Encounter: Payer: Self-pay | Admitting: Rehabilitative and Restorative Service Providers"

## 2017-07-01 ENCOUNTER — Other Ambulatory Visit: Payer: Self-pay | Admitting: Family Medicine

## 2017-07-01 DIAGNOSIS — R29898 Other symptoms and signs involving the musculoskeletal system: Secondary | ICD-10-CM

## 2017-07-01 DIAGNOSIS — M542 Cervicalgia: Secondary | ICD-10-CM | POA: Diagnosis not present

## 2017-07-01 DIAGNOSIS — R531 Weakness: Secondary | ICD-10-CM | POA: Diagnosis not present

## 2017-07-01 DIAGNOSIS — M25512 Pain in left shoulder: Secondary | ICD-10-CM

## 2017-07-01 DIAGNOSIS — G8929 Other chronic pain: Secondary | ICD-10-CM | POA: Diagnosis not present

## 2017-07-01 NOTE — Therapy (Signed)
[facility] [place] [address] [place], [place], [address] Phone: [phone]   Fax:  [phone]  Physical Therapy Treatment  Patient Details  Name: Joann Maddox MRN: [id number] Date of Birth: 1940/08/22 Referring Provider: Dr Linford Arnold   Encounter Date: 07/01/2017  PT End of Session - 07/01/17 1537    Visit Number  3    Number of Visits  18    Date for PT Re-Evaluation  08/25/17    PT Start Time  1538    PT Stop Time  1631    PT Time Calculation (min)  53 min    Equipment Utilized During Treatment  Gait belt    Activity Tolerance  Patient tolerated treatment well       Past Medical History:  Diagnosis Date  . Anxiety   . Headache(784.0)    migrains  . Heart murmur   . Hypothyroidism   . Rheumatoid arthritis(714.0)     Past Surgical History:  Procedure Laterality Date  . ABDOMINAL HYSTERECTOMY    . APPENDECTOMY    . CHOLECYSTECTOMY    . REPLACEMENT TOTAL KNEE BILATERAL    . TONSILLECTOMY     as  a child  . TOTAL HIP ARTHROPLASTY     right and left    There were no vitals filed for this visit.  Subjective Assessment - 07/01/17 1541    Subjective  Out of town for the past few weeks visiting her son. Has had increased pain with travel through airports and in planes for hours at a time.     Currently in Pain?  Yes    Pain Score  7     Pain Location  Neck    Pain Orientation  Left    Pain Descriptors / Indicators  Aching;Dull;Sharp    Pain Type  Chronic pain    Pain Onset  More than a month ago    Pain Frequency  Constant    Pain Score  7    Pain Location  Shoulder    Pain Orientation  Left    Pain Descriptors / Indicators  Aching;Dull;Tightness    Pain Type  Chronic pain         [facility] PT Assessment - 07/01/17 0001      Assessment   Medical Diagnosis  cervical dysfunction; Lt shoulder pain     Referring Provider  Dr Linford Arnold    Onset Date/Surgical Date  12/26/16 symptoms chronic in nature for  several years     Hand Dominance  Right    Next MD Visit  07/06/17    Prior Therapy  yes 4/18 to 7/18 here      AROM   Right Shoulder Extension  66 Degrees    Right Shoulder Flexion  117 Degrees    Right Shoulder ABduction  110 Degrees    Right Shoulder Internal Rotation  50 Degrees    Right Shoulder External Rotation  55 Degrees    Left Shoulder Extension  50 Degrees    Left Shoulder Flexion  50 Degrees    Left Shoulder ABduction  51 Degrees    Left Shoulder Internal Rotation  44 Degrees    Left Shoulder External Rotation  22 Degrees    Cervical Flexion  46    Cervical Extension  42    Cervical - Right Side Bend  27    Cervical - Left Side Bend  37    Cervical - Right Rotation  50  Cervical - Left Rotation  42                  [facility] Adult PT Treatment/Exercise - 07/01/17 0001      Shoulder Exercises: Seated   Other Seated Exercises  axial extension 15 sec hold x 5     Other Seated Exercises  shoulder rolls x 10 backwards       Moist Heat Therapy   Number Minutes Moist Heat  20 Minutes    Moist Heat Location  Cervical;Shoulder      Electrical Stimulation   Electrical Stimulation Location  Lt lateral cervical; Lt shoulder     Electrical Stimulation Action  IFC    Electrical Stimulation Parameters  to tolerance    Electrical Stimulation Goals  Pain;Tone      Manual Therapy   Manual therapy comments  pt sitting     Soft tissue mobilization  Lt lateral and posterior cervical musculature; upper trap; leveator; posterior deltoid;       Neck Exercises: Stretches   Neck Stretch  -- lateral cervical flexioin and rotation to each side x 2 each    Other Neck Stretches  axial extension 10 sec x 10 in sitting        Trigger Point Dry Needling - 07/01/17 1559    Consent Given?  Yes    Muscles Treated Upper Body  -- Lt with estim     Upper Trapezius Response  Palpable increased muscle length    Levator Scapulae Response  Palpable increased muscle length     Longissimus Response  Palpable increased muscle length cervical              PT Short Term Goals - 07/01/17 1600      PT SHORT TERM GOAL #1   Title  Education re-sleeping position with patient verbalizing understanding of importance of head support when in recliner 06/29/17    Time  4    Period  Weeks    Status  On-going      PT SHORT TERM GOAL #2   Title  Initiate UE ROM and strengthening exercises with patient to report at least 15 min of exercise each day 06/29/17    Time  4    Period  Weeks    Status  On-going        PT Long Term Goals - 07/01/17 1559      PT LONG TERM GOAL #1   Title  Increase AROM cervical spine by 5-10 degrees in all planes 08/25/17    Time  12    Period  Weeks    Status  On-going      PT LONG TERM GOAL #2   Title  Increase AROM Lt shoudler by 5-15 degrees in flexion and abduction 08/25/17    Time  12    Period  Weeks    Status  On-going      PT LONG TERM GOAL #3   Time  12    Period  Weeks    Status  On-going      PT LONG TERM GOAL #4   Title  patient reports performing some exercise for neck and shoulders at least once daily 08/25/17    Time  12    Period  Weeks    Status  On-going      PT LONG TERM GOAL #5   Title  Independent in HEP 08/25/17    Time  12    Period  Weeks  Status  On-going      PT LONG TERM GOAL #6   Title  Improve FOTO to </= 50% limitation 08/25/17    Time  12    Period  Weeks    Status  On-going            Plan - 07/01/17 1600    Clinical Impression Statement  Continued pain and limitations in ROM and strength UE/neck. Patient has palpable tightness throughout Lt upper quarter. REsponds well to DN and manual work. Patient needs to be consistent with HEP to realize gains.     Rehab Potential  Good    Clinical Impairments Affecting Rehab Potential  multiple co-morbities     PT Frequency  2x / week    PT Duration  12 weeks    PT Treatment/Interventions  Patient/family education;ADLs/Self Care Home  Management;Cryotherapy;Electrical Stimulation;Iontophoresis 4mg /ml Dexamethasone;Moist Heat;Ultrasound;Dry needling;Manual techniques;Therapeutic activities;Therapeutic exercise    PT Next Visit Plan  postural exercises; gentle stretching; pulleys for UE ROM; posterior shoulder girdle strengthening; manual therapy; DN; modalities as indicated     Consulted and Agree with Plan of Care  Patient       Patient will benefit from skilled therapeutic intervention in order to improve the following deficits and impairments:  Postural dysfunction, Improper body mechanics, Pain, Increased fascial restricitons, Increased muscle spasms, Decreased range of motion, Decreased mobility, Decreased strength, Impaired UE functional use, Decreased activity tolerance  Visit Diagnosis: Cervicalgia  Chronic left shoulder pain  Other symptoms and signs involving the musculoskeletal system  Weakness generalized     Problem List Patient Active Problem List   Diagnosis Date Noted  . Chronic venous insufficiency 04/29/2017  . Cervical spondylosis 07/20/2016  . 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HCC) 02/12/2015  . Type 2 diabetes mellitus without complication (HCC) 08/10/2014  . Chronic constipation 02/19/2014  . Rosacea 11/06/2013  . GAD (generalized anxiety disorder) 06/08/2013  . Essential hypertension, benign 05/04/2013  . Rheumatoid arthritis (HCC) 05/04/2013  . Migraine with aura 05/04/2013  . Palpitations 05/04/2013  . Murmur, heart 05/04/2013  . Hyperlipidemia 05/04/2013  . Hypothyroidism 05/04/2013    Kandice Schmelter 07/02/2013 PT, MPH  07/01/2017, 4:03 PM  [facility] [address] [place], [place], [place] Phone: [phone]   Fax:  [phone]  Name: Joann Maddox MRN: Lowry Ram Date of Birth: [birth year]

## 2017-07-05 ENCOUNTER — Ambulatory Visit (INDEPENDENT_AMBULATORY_CARE_PROVIDER_SITE_OTHER): Payer: Medicare Other | Admitting: Rehabilitative and Restorative Service Providers"

## 2017-07-05 ENCOUNTER — Encounter: Payer: Self-pay | Admitting: Rehabilitative and Restorative Service Providers"

## 2017-07-05 DIAGNOSIS — M25512 Pain in left shoulder: Secondary | ICD-10-CM | POA: Diagnosis not present

## 2017-07-05 DIAGNOSIS — R531 Weakness: Secondary | ICD-10-CM | POA: Diagnosis not present

## 2017-07-05 DIAGNOSIS — M542 Cervicalgia: Secondary | ICD-10-CM | POA: Diagnosis not present

## 2017-07-05 DIAGNOSIS — G8929 Other chronic pain: Secondary | ICD-10-CM | POA: Diagnosis not present

## 2017-07-05 DIAGNOSIS — R29898 Other symptoms and signs involving the musculoskeletal system: Secondary | ICD-10-CM

## 2017-07-05 NOTE — Therapy (Signed)
[facility] [place] [address] [place], [place], [address] Phone: [phone]   Fax:  [phone]  Physical Therapy Treatment  Patient Details  Name: Joann Maddox MRN: [id number] Date of Birth: January 05, 1941 Referring Provider: Dr Linford Arnold   Encounter Date: 07/05/2017  PT End of Session - 07/05/17 1402    Visit Number  4    Number of Visits  18    Date for PT Re-Evaluation  08/25/17    PT Start Time  1400    PT Stop Time  1452    PT Time Calculation (min)  52 min    Activity Tolerance  Patient tolerated treatment well       Past Medical History:  Diagnosis Date  . Anxiety   . Headache(784.0)    migrains  . Heart murmur   . Hypothyroidism   . Rheumatoid arthritis(714.0)     Past Surgical History:  Procedure Laterality Date  . ABDOMINAL HYSTERECTOMY    . APPENDECTOMY    . CHOLECYSTECTOMY    . REPLACEMENT TOTAL KNEE BILATERAL    . TONSILLECTOMY     as  a child  . TOTAL HIP ARTHROPLASTY     right and left    There were no vitals filed for this visit.  Subjective Assessment - 07/05/17 1403    Subjective  Last treatment helped with pain. She has tried to do some exercises but she has not done what she should. Some increased pain in the Lt shoulder area.     Currently in Pain?  Yes    Pain Score  6     Pain Location  Neck    Pain Orientation  Left    Pain Descriptors / Indicators  Aching;Dull    Pain Type  Chronic pain    Pain Score  6    Pain Location  Shoulder    Pain Orientation  Left    Pain Descriptors / Indicators  Aching;Dull;Tightness    Pain Type  Chronic pain                      [facility] Adult PT Treatment/Exercise - 07/05/17 0001      Neck Exercises: Seated   Neck Retraction  5 reps;10 secs      Shoulder Exercises: Seated   Other Seated Exercises  axial extension 15 sec hold x 5     Other Seated Exercises  shoulder rolls x 10 backwards       Shoulder Exercises: Pulleys   Flexion  2 minutes    ABduction  2 minutes      Moist Heat Therapy   Number Minutes Moist Heat  20 Minutes    Moist Heat Location  Cervical;Shoulder      Electrical Stimulation   Electrical Stimulation Location  Lt lateral cervical; Lt shoulder     Electrical Stimulation Action  IFC    Electrical Stimulation Parameters  to tolerance    Electrical Stimulation Goals  Pain;Tone      Manual Therapy   Manual therapy comments  pt sitting     Soft tissue mobilization  Lt lateral and posterior cervical musculature; upper trap; leveator; posterior deltoid;        Trigger Point Dry Needling - 07/05/17 1437    Consent Given?  Yes    Muscles Treated Upper Body  -- Lt estim  Deltoid     Upper Trapezius Response  Palpable increased muscle length    Levator Scapulae  Response  Palpable increased muscle length             PT Short Term Goals - 07/01/17 1600      PT SHORT TERM GOAL #1   Title  Education re-sleeping position with patient verbalizing understanding of importance of head support when in recliner 06/29/17    Time  4    Period  Weeks    Status  On-going      PT SHORT TERM GOAL #2   Title  Initiate UE ROM and strengthening exercises with patient to report at least 15 min of exercise each day 06/29/17    Time  4    Period  Weeks    Status  On-going        PT Long Term Goals - 07/01/17 1559      PT LONG TERM GOAL #1   Title  Increase AROM cervical spine by 5-10 degrees in all planes 08/25/17    Time  12    Period  Weeks    Status  On-going      PT LONG TERM GOAL #2   Title  Increase AROM Lt shoudler by 5-15 degrees in flexion and abduction 08/25/17    Time  12    Period  Weeks    Status  On-going      PT LONG TERM GOAL #3   Time  12    Period  Weeks    Status  On-going      PT LONG TERM GOAL #4   Title  patient reports performing some exercise for neck and shoulders at least once daily 08/25/17    Time  12    Period  Weeks    Status  On-going      PT LONG TERM  GOAL #5   Title  Independent in HEP 08/25/17    Time  12    Period  Weeks    Status  On-going      PT LONG TERM GOAL #6   Title  Improve FOTO to </= 50% limitation 08/25/17    Time  12    Period  Weeks    Status  On-going            Plan - 07/05/17 1409    Clinical Impression Statement  Patient is inconsistent with HEP which is important for progress and sustained improvement. Encouraged patient to purchase a pullet for home. She continues to have limited ROM/mobility through the cervical spine and Lt shoulder. Respondes well to DN and manual work.     Rehab Potential  Good    Clinical Impairments Affecting Rehab Potential  multiple co-morbities     PT Frequency  2x / week    PT Duration  12 weeks    PT Treatment/Interventions  Patient/family education;ADLs/Self Care Home Management;Cryotherapy;Electrical Stimulation;Iontophoresis 4mg /ml Dexamethasone;Moist Heat;Ultrasound;Dry needling;Manual techniques;Therapeutic activities;Therapeutic exercise    PT Next Visit Plan  postural exercises; gentle stretching; pulleys for UE ROM; posterior shoulder girdle strengthening; manual therapy; DN; modalities as indicated     Consulted and Agree with Plan of Care  Patient       Patient will benefit from skilled therapeutic intervention in order to improve the following deficits and impairments:  Postural dysfunction, Improper body mechanics, Pain, Increased fascial restricitons, Increased muscle spasms, Decreased range of motion, Decreased mobility, Decreased strength, Impaired UE functional use, Decreased activity tolerance  Visit Diagnosis: Cervicalgia  Chronic left shoulder pain  Other symptoms and signs involving the musculoskeletal system  Weakness generalized     Problem List Patient Active Problem List   Diagnosis Date Noted  . Chronic venous insufficiency 04/29/2017  . Cervical spondylosis 07/20/2016  . 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 Microalbuminuria due  to type 2 diabetes mellitus (HCC) 02/12/2015  . Type 2 diabetes mellitus without complication (HCC) 08/10/2014  . Chronic constipation 02/19/2014  . Rosacea 11/06/2013  . GAD (generalized anxiety disorder) 06/08/2013  . Essential hypertension, benign 05/04/2013  . Rheumatoid arthritis (HCC) 05/04/2013  . Migraine with aura 05/04/2013  . Palpitations 05/04/2013  . Murmur, heart 05/04/2013  . Hyperlipidemia 05/04/2013  . Hypothyroidism 05/04/2013    Yi Falletta Rober Minion PT, MPH  07/05/2017, 2:39 PM  [facility] [address] [place], [place], [address] Phone: [phone]   Fax:  [phone]  Name: Joann Maddox MRN: [id number] Date of Birth: 1941/04/02

## 2017-07-08 ENCOUNTER — Ambulatory Visit: Payer: Medicare Other | Admitting: Rehabilitative and Restorative Service Providers"

## 2017-07-08 DIAGNOSIS — M25512 Pain in left shoulder: Secondary | ICD-10-CM

## 2017-07-08 DIAGNOSIS — M542 Cervicalgia: Secondary | ICD-10-CM | POA: Diagnosis not present

## 2017-07-08 DIAGNOSIS — R531 Weakness: Secondary | ICD-10-CM

## 2017-07-08 DIAGNOSIS — G8929 Other chronic pain: Secondary | ICD-10-CM | POA: Diagnosis not present

## 2017-07-08 DIAGNOSIS — R29898 Other symptoms and signs involving the musculoskeletal system: Secondary | ICD-10-CM

## 2017-07-08 NOTE — Therapy (Signed)
[facility] [place] [address] [place] [address] [place], [place], [address] Phone: [phone]   Fax:  [phone]  Physical Therapy Treatment  Patient Details  Name: Joann Maddox MRN: [id number] Date of Birth: 1940-06-30 Referring Provider: Dr Linford Arnold   Encounter Date: 07/08/2017  PT End of Session - 07/08/17 1430    Visit Number  5    Number of Visits  18    Date for PT Re-Evaluation  08/25/17    PT Start Time  1410 pt late for appt     PT Stop Time  1500    PT Time Calculation (min)  50 min    Activity Tolerance  Patient tolerated treatment well       Past Medical History:  Diagnosis Date  . Anxiety   . Headache(784.0)    migrains  . Heart murmur   . Hypothyroidism   . Rheumatoid arthritis(714.0)     Past Surgical History:  Procedure Laterality Date  . ABDOMINAL HYSTERECTOMY    . APPENDECTOMY    . CHOLECYSTECTOMY    . REPLACEMENT TOTAL KNEE BILATERAL    . TONSILLECTOMY     as  a child  . TOTAL HIP ARTHROPLASTY     right and left    There were no vitals filed for this visit.  Subjective Assessment - 07/08/17 1415    Subjective  Better than she was Tuesday. Less pain. Has her pulley now and will put it up tonight. Plans to work on pulley and exercises. Has been using her arms for more functional activities.     Currently in Pain?  Yes    Pain Score  6     Pain Location  Neck    Pain Orientation  Left    Pain Descriptors / Indicators  Aching;Dull    Pain Type  Chronic pain    Pain Onset  More than a month ago         [facility] PT Assessment - 07/08/17 0001      Assessment   Medical Diagnosis  cervical dysfunction; Lt shoulder pain     Referring Provider  Dr Linford Arnold    Onset Date/Surgical Date  12/26/16 symptoms chronic in nature for several years     Hand Dominance  Right    Next MD Visit  07/06/17    Prior Therapy  yes 4/18 to 7/18 here      Palpation   Palpation comment  muscular tightness Lt > Rt upper  quarter including upper traps; leveator; pecs; ant/lat/posterior cervical musculature; anterior deltoid; biceps                   [facility] Adult PT Treatment/Exercise - 07/08/17 0001      Neck Exercises: Seated   Neck Retraction  5 reps;10 secs    Shoulder Rolls  Backwards;10 reps      Shoulder Exercises: Seated   Row  Strengthening;Both;20 reps;Theraband    Theraband Level (Shoulder Row)  Level 1 (Yellow)    Other Seated Exercises  axial extension 15 sec hold x 5     Other Seated Exercises  shoulder rolls x 10 backwards       Shoulder Exercises: Pulleys   Flexion  2 minutes    ABduction  2 minutes      Moist Heat Therapy   Number Minutes Moist Heat  20 Minutes    Moist Heat Location  Cervical;Shoulder      Electrical Stimulation   Electrical Stimulation Location  Lt lateral cervical; Lt shoulder     Electrical Stimulation Action  IFC    Electrical Stimulation Parameters  to tolerance    Electrical Stimulation Goals  Pain;Tone      Manual Therapy   Manual therapy comments  pt sitting     Soft tissue mobilization  Lt lateral and posterior cervical musculature; upper trap; leveator; posterior deltoid;        Trigger Point Dry Needling - 07/08/17 1434    Consent Given?  Yes    Muscles Treated Upper Body  -- Lt biceps - Lt upper traps/lateral cervical with estim     Upper Trapezius Response  Palpable increased muscle length    Levator Scapulae Response  Palpable increased muscle length             PT Short Term Goals - 07/01/17 1600      PT SHORT TERM GOAL #1   Title  Education re-sleeping position with patient verbalizing understanding of importance of head support when in recliner 06/29/17    Time  4    Period  Weeks    Status  On-going      PT SHORT TERM GOAL #2   Title  Initiate UE ROM and strengthening exercises with patient to report at least 15 min of exercise each day 06/29/17    Time  4    Period  Weeks    Status  On-going        PT Long Term  Goals - 07/08/17 1413      PT LONG TERM GOAL #1   Title  Increase AROM cervical spine by 5-10 degrees in all planes 08/25/17    Time  12    Period  Weeks    Status  On-going      PT LONG TERM GOAL #2   Title  Increase AROM Lt shoudler by 5-15 degrees in flexion and abduction 08/25/17    Time  12    Period  Weeks    Status  On-going      PT LONG TERM GOAL #3   Title  Increase strength Lt/Rt shoulders allowing patient to use UE's for more functional activities including combing hair, washing face, etc 08/25/17     Time  12    Period  Weeks    Status  On-going      PT LONG TERM GOAL #4   Title  patient reports performing some exercise for neck and shoulders at least once daily 08/25/17    Time  12    Period  Weeks    Status  On-going      PT LONG TERM GOAL #5   Title  Independent in HEP 08/25/17    Time  12    Period  Weeks    Status  On-going      PT LONG TERM GOAL #6   Title  Improve FOTO to </= 50% limitation 08/25/17    Time  12    Period  Weeks    Status  On-going            Plan - 07/08/17 1431    Clinical Impression Statement  Patient reports good response to last treatment and now has pulley to use for HEP. She continues to respond well to manual work and DN with less palpable tightness. Bless needs to work consistently on [employer] and improving posture and alignment.     Rehab Potential  Good    Clinical Impairments Affecting Rehab Potential  multiple co-morbities     PT Frequency  2x / week    PT Duration  12 weeks    PT Treatment/Interventions  Patient/family education;ADLs/Self Care Home Management;Cryotherapy;Electrical Stimulation;Iontophoresis 4mg /ml Dexamethasone;Moist Heat;Ultrasound;Dry needling;Manual techniques;Therapeutic activities;Therapeutic exercise    PT Next Visit Plan  postural exercises; gentle stretching; pulleys for UE ROM; posterior shoulder girdle strengthening; manual therapy; DN; modalities as indicated     Consulted and Agree with Plan of Care   Patient       Patient will benefit from skilled therapeutic intervention in order to improve the following deficits and impairments:  Postural dysfunction, Improper body mechanics, Pain, Increased fascial restricitons, Increased muscle spasms, Decreased range of motion, Decreased mobility, Decreased strength, Impaired UE functional use, Decreased activity tolerance  Visit Diagnosis: Cervicalgia  Chronic left shoulder pain  Other symptoms and signs involving the musculoskeletal system  Weakness generalized     Problem List Patient Active Problem List   Diagnosis Date Noted  . Chronic venous insufficiency 04/29/2017  . Cervical spondylosis 07/20/2016  . 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HCC) 02/12/2015  . Type 2 diabetes mellitus without complication (HCC) 08/10/2014  . Chronic constipation 02/19/2014  . Rosacea 11/06/2013  . GAD (generalized anxiety disorder) 06/08/2013  . Essential hypertension, benign 05/04/2013  . Rheumatoid arthritis (HCC) 05/04/2013  . Migraine with aura 05/04/2013  . Palpitations 05/04/2013  . Murmur, heart 05/04/2013  . Hyperlipidemia 05/04/2013  . Hypothyroidism 05/04/2013    Maralyn Witherell 07/02/2013 PT, MPH  07/08/2017, 2:45 PM  [facility] [address] [place] [address] [place], [place], [place] Phone: [phone]   Fax:  [phone]  Name: Joann Maddox MRN: Lowry Ram Date of Birth: 03-14-1941

## 2017-07-12 ENCOUNTER — Encounter: Payer: Self-pay | Admitting: Rehabilitative and Restorative Service Providers"

## 2017-07-12 ENCOUNTER — Ambulatory Visit (INDEPENDENT_AMBULATORY_CARE_PROVIDER_SITE_OTHER): Payer: Medicare Other | Admitting: Rehabilitative and Restorative Service Providers"

## 2017-07-12 DIAGNOSIS — R29898 Other symptoms and signs involving the musculoskeletal system: Secondary | ICD-10-CM

## 2017-07-12 DIAGNOSIS — M542 Cervicalgia: Secondary | ICD-10-CM

## 2017-07-12 DIAGNOSIS — R531 Weakness: Secondary | ICD-10-CM

## 2017-07-12 DIAGNOSIS — G8929 Other chronic pain: Secondary | ICD-10-CM

## 2017-07-12 DIAGNOSIS — M25512 Pain in left shoulder: Secondary | ICD-10-CM

## 2017-07-12 NOTE — Therapy (Signed)
[facility] [place] [address] [place] [address] [place], [place], [address] Phone: [phone]   Fax:  [phone]  Physical Therapy Treatment  Patient Details  Name: Joann Maddox MRN: [id number] Date of Birth: [birth year] Referring Provider: Dr Linford Arnold   Encounter Date: 07/12/2017  PT End of Session - 07/12/17 1421    Visit Number  6    Number of Visits  18    Date for PT Re-Evaluation  08/25/17    PT Start Time  1409 pt 9 min late for appt     PT Stop Time  1458    PT Time Calculation (min)  49 min    Activity Tolerance  Patient tolerated treatment well       Past Medical History:  Diagnosis Date  . Anxiety   . Headache(784.0)    migrains  . Heart murmur   . Hypothyroidism   . Rheumatoid arthritis(714.0)     Past Surgical History:  Procedure Laterality Date  . ABDOMINAL HYSTERECTOMY    . APPENDECTOMY    . CHOLECYSTECTOMY    . REPLACEMENT TOTAL KNEE BILATERAL    . TONSILLECTOMY     as  a child  . TOTAL HIP ARTHROPLASTY     right and left    There were no vitals filed for this visit.  Subjective Assessment - 07/12/17 1423    Subjective  Shoulder is less painful today. Neck is hurting more. Not sleeping well because of her new puppy at home this weekend. Husband has pulley and will put the pulley up today. Understands the importance of HEP     Currently in Pain?  Yes    Pain Score  7     Pain Location  Neck    Pain Orientation  Left    Pain Descriptors / Indicators  Aching;Dull    Pain Type  Chronic pain    Pain Onset  More than a month ago                      [facility] Adult PT Treatment/Exercise - 07/12/17 0001      Neck Exercises: Seated   Neck Retraction  5 reps;10 secs    Shoulder Rolls  Backwards;10 reps      Shoulder Exercises: Seated   Other Seated Exercises  axial extension 15 sec hold x 5     Other Seated Exercises  shoulder rolls x 10 backwards       Moist Heat Therapy   Number Minutes  Moist Heat  20 Minutes    Moist Heat Location  Cervical;Shoulder      Electrical Stimulation   Electrical Stimulation Location  Lt lateral cervical; Lt shoulder     Electrical Stimulation Action  IFC    Electrical Stimulation Parameters  to tolerance    Electrical Stimulation Goals  Pain;Tone      Manual Therapy   Manual therapy comments  pt sitting     Soft tissue mobilization  Lt lateral and posterior cervical musculature; upper trap; leveator; posterior deltoid;        Trigger Point Dry Needling - 07/12/17 1425    Consent Given?  Yes    Muscles Treated Upper Body  -- Lt with estim     Upper Trapezius Response  Palpable increased muscle length    Levator Scapulae Response  Palpable increased muscle length             PT Short Term Goals - 07/01/17 1600  PT SHORT TERM GOAL #1   Title  Education re-sleeping position with patient verbalizing understanding of importance of head support when in recliner 06/29/17    Time  4    Period  Weeks    Status  On-going      PT SHORT TERM GOAL #2   Title  Initiate UE ROM and strengthening exercises with patient to report at least 15 min of exercise each day 06/29/17    Time  4    Period  Weeks    Status  On-going        PT Long Term Goals - 07/08/17 1413      PT LONG TERM GOAL #1   Title  Increase AROM cervical spine by 5-10 degrees in all planes 08/25/17    Time  12    Period  Weeks    Status  On-going      PT LONG TERM GOAL #2   Title  Increase AROM Lt shoudler by 5-15 degrees in flexion and abduction 08/25/17    Time  12    Period  Weeks    Status  On-going      PT LONG TERM GOAL #3   Title  Increase strength Lt/Rt shoulders allowing patient to use UE's for more functional activities including combing hair, washing face, etc 08/25/17     Time  12    Period  Weeks    Status  On-going      PT LONG TERM GOAL #4   Title  patient reports performing some exercise for neck and shoulders at least once daily 08/25/17    Time   12    Period  Weeks    Status  On-going      PT LONG TERM GOAL #5   Title  Independent in HEP 08/25/17    Time  12    Period  Weeks    Status  On-going      PT LONG TERM GOAL #6   Title  Improve FOTO to </= 50% limitation 08/25/17    Time  12    Period  Weeks    Status  On-going            Plan - 07/12/17 1422    Clinical Impression Statement  Patient to begin use of pulley today. Needs to exercise at home oon a regular basis. Good response to DN with decreased muscular tightness to palpatioin following manual work and DN with report of decreased pain.     Rehab Potential  Good    Clinical Impairments Affecting Rehab Potential  multiple co-morbities     PT Frequency  2x / week    PT Duration  12 weeks    PT Treatment/Interventions  Patient/family education;ADLs/Self Care Home Management;Cryotherapy;Electrical Stimulation;Iontophoresis 4mg /ml Dexamethasone;Moist Heat;Ultrasound;Dry needling;Manual techniques;Therapeutic activities;Therapeutic exercise    PT Next Visit Plan  postural exercises; gentle stretching; pulleys for UE ROM; posterior shoulder girdle strengthening; manual therapy; DN; modalities as indicated     Consulted and Agree with Plan of Care  Patient       Patient will benefit from skilled therapeutic intervention in order to improve the following deficits and impairments:  Postural dysfunction, Improper body mechanics, Pain, Increased fascial restricitons, Increased muscle spasms, Decreased range of motion, Decreased mobility, Decreased strength, Impaired UE functional use, Decreased activity tolerance  Visit Diagnosis: Cervicalgia  Chronic left shoulder pain  Other symptoms and signs involving the musculoskeletal system  Weakness generalized     Problem List  Patient Active Problem List   Diagnosis Date Noted  . Chronic venous insufficiency 04/29/2017  . Cervical spondylosis 07/20/2016  . 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HCC) 02/12/2015  . Type 2 diabetes mellitus without complication (HCC) 08/10/2014  . Chronic constipation 02/19/2014  . Rosacea 11/06/2013  . GAD (generalized anxiety disorder) 06/08/2013  . Essential hypertension, benign 05/04/2013  . Rheumatoid arthritis (HCC) 05/04/2013  . Migraine with aura 05/04/2013  . Palpitations 05/04/2013  . Murmur, heart 05/04/2013  . Hyperlipidemia 05/04/2013  . Hypothyroidism 05/04/2013    Lorenia Hoston Rober Minion PT,MPH  07/12/2017, 2:35 PM  [facility] [address] [place] [address] [place], [place], [address] Phone: [phone]   Fax:  [phone]  Name: Joann Maddox MRN: [id number] Date of Birth: [birth year]

## 2017-07-15 ENCOUNTER — Ambulatory Visit: Payer: Medicare Other | Admitting: Rehabilitative and Restorative Service Providers"

## 2017-07-15 DIAGNOSIS — R29898 Other symptoms and signs involving the musculoskeletal system: Secondary | ICD-10-CM

## 2017-07-15 DIAGNOSIS — M542 Cervicalgia: Secondary | ICD-10-CM

## 2017-07-15 DIAGNOSIS — M25512 Pain in left shoulder: Secondary | ICD-10-CM | POA: Diagnosis not present

## 2017-07-15 DIAGNOSIS — R531 Weakness: Secondary | ICD-10-CM | POA: Diagnosis not present

## 2017-07-15 DIAGNOSIS — G8929 Other chronic pain: Secondary | ICD-10-CM | POA: Diagnosis not present

## 2017-07-15 NOTE — Patient Instructions (Signed)
Thera cane

## 2017-07-15 NOTE — Therapy (Signed)
[facility] [place] [address] [place] [address] [place], [place], [address] Phone: [phone]   Fax:  [phone]  Physical Therapy Treatment  Patient Details  Name: Joann Maddox MRN: [id number] Date of Birth: Oct 08, 1940 Referring Provider: Dr Linford Arnold   Encounter Date: 07/15/2017  PT End of Session - 07/15/17 1445    Visit Number  7    Number of Visits  18    Date for PT Re-Evaluation  08/25/17    PT Start Time  1445    PT Stop Time  1543    PT Time Calculation (min)  58 min    Activity Tolerance  Patient tolerated treatment well       Past Medical History:  Diagnosis Date  . Anxiety   . Headache(784.0)    migrains  . Heart murmur   . Hypothyroidism   . Rheumatoid arthritis(714.0)     Past Surgical History:  Procedure Laterality Date  . ABDOMINAL HYSTERECTOMY    . APPENDECTOMY    . CHOLECYSTECTOMY    . REPLACEMENT TOTAL KNEE BILATERAL    . TONSILLECTOMY     as  a child  . TOTAL HIP ARTHROPLASTY     right and left    There were no vitals filed for this visit.  Subjective Assessment - 07/15/17 1508    Subjective  Has her pulley up at home and has been using it daily. Thinks it is helping. Having a pretty good day today. Neck is not at tight as it has been. Tight in the shoulder area.    Currently in Pain?  Yes    Pain Score  5     Pain Location  Neck    Pain Orientation  Left    Pain Descriptors / Indicators  Aching;Dull    Pain Type  Chronic pain    Pain Score  5    Pain Location  Shoulder    Pain Orientation  Left    Pain Descriptors / Indicators  Aching;Dull;Tightness    Pain Type  Chronic pain         [facility] PT Assessment - 07/15/17 0001      Assessment   Medical Diagnosis  cervical dysfunction; Lt shoulder pain     Referring Provider  Dr Linford Arnold    Onset Date/Surgical Date  12/26/16 symptoms chronic in nature for several years     Hand Dominance  Right    Next MD Visit  07/06/17      AROM   Cervical  Flexion  54    Cervical Extension  40    Cervical - Right Side Bend  28    Cervical - Left Side Bend  35    Cervical - Right Rotation  51    Cervical - Left Rotation  47      Palpation   Palpation comment  muscular tightness Lt > Rt upper quarter including upper traps; leveator; pecs; ant/lat/posterior cervical musculature; anterior deltoid; biceps                   [facility] Adult PT Treatment/Exercise - 07/15/17 0001      Shoulder Exercises: Standing   Extension  Strengthening;Right;Left;20 reps;Theraband    Theraband Level (Shoulder Extension)  Level 2 (Red)    Row  Strengthening;Right;Left;20 reps;Theraband    Theraband Level (Shoulder Row)  Level 2 (Red)    Retraction  Strengthening;Right;Left;20 reps;Theraband    Theraband Level (Shoulder Retraction)  Level 2 (Red) holding Lt UE still  moving Rt       Shoulder Exercises: Pulleys   Flexion  2 minutes    ABduction  2 minutes      Moist Heat Therapy   Number Minutes Moist Heat  20 Minutes    Moist Heat Location  Cervical;Shoulder      Electrical Stimulation   Electrical Stimulation Location  Lt lateral cervical; Lt shoulder     Electrical Stimulation Action  IFC    Electrical Stimulation Parameters  to tolerance    Electrical Stimulation Goals  Pain;Tone      Manual Therapy   Manual therapy comments  pt sitting     Soft tissue mobilization  Lt lateral and posterior cervical and upper thoracic musculature; upper trap; leveator; posterior deltoid;        Trigger Point Dry Needling - 07/15/17 1507    Consent Given?  Yes    Muscles Treated Upper Body  -- Lt with estim     Upper Trapezius Response  Palpable increased muscle length    Levator Scapulae Response  Palpable increased muscle length    Longissimus Response  Palpable increased muscle length cervical and thoracic           PT Education - 07/15/17 1524    Education provided  Yes    Education Details  Location manager) Educated  Patient     Methods  Explanation    Comprehension  Verbalized understanding       PT Short Term Goals - 07/15/17 1447      PT SHORT TERM GOAL #1   Title  Education re-sleeping position with patient verbalizing understanding of importance of head support when in recliner 06/29/17    Time  4    Period  Weeks    Status  On-going      PT SHORT TERM GOAL #2   Title  Initiate UE ROM and strengthening exercises with patient to report at least 15 min of exercise each day 06/29/17    Time  4    Period  Weeks    Status  On-going        PT Long Term Goals - 07/15/17 1446      PT LONG TERM GOAL #1   Title  Increase AROM cervical spine by 5-10 degrees in all planes 08/25/17    Time  12    Period  Weeks    Status  On-going      PT LONG TERM GOAL #2   Title  Increase AROM Lt shoudler by 5-15 degrees in flexion and abduction 08/25/17    Time  12    Period  Weeks    Status  On-going      PT LONG TERM GOAL #3   Title  Increase strength Lt/Rt shoulders allowing patient to use UE's for more functional activities including combing hair, washing face, etc 08/25/17     Time  12    Period  Weeks    Status  On-going      PT LONG TERM GOAL #4   Title  patient reports performing some exercise for neck and shoulders at least once daily 08/25/17    Time  12    Period  Weeks    Status  On-going      PT LONG TERM GOAL #5   Title  Independent in HEP 08/25/17    Time  12    Period  Weeks    Status  On-going  PT LONG TERM GOAL #6   Title  Improve FOTO to </= 50% limitation 08/25/17    Time  12    Period  Weeks    Status  On-going            Plan - 07/15/17 1524    Clinical Impression Statement  Improved pain level today. Less palpable tightness noted through the Lt cervical and shoulder area. Some increases in AROM of cervical spine. Patient now working with pulley at home. Encouraged to add theraband exercises 1x/day.     Rehab Potential  Good    PT Frequency  2x / week    PT Duration  12 weeks    PT  Treatment/Interventions  Patient/family education;ADLs/Self Care Home Management;Cryotherapy;Electrical Stimulation;Iontophoresis 4mg /ml Dexamethasone;Moist Heat;Ultrasound;Dry needling;Manual techniques;Therapeutic activities;Therapeutic exercise    PT Next Visit Plan  postural exercises; gentle stretching; pulleys for UE ROM; posterior shoulder girdle strengthening; manual therapy; DN; modalities as indicated     Consulted and Agree with Plan of Care  Patient       Patient will benefit from skilled therapeutic intervention in order to improve the following deficits and impairments:  Postural dysfunction, Improper body mechanics, Pain, Increased fascial restricitons, Increased muscle spasms, Decreased range of motion, Decreased mobility, Decreased strength, Impaired UE functional use, Decreased activity tolerance  Visit Diagnosis: Cervicalgia  Chronic left shoulder pain  Other symptoms and signs involving the musculoskeletal system  Weakness generalized     Problem List Patient Active Problem List   Diagnosis Date Noted  . Chronic venous insufficiency 04/29/2017  . Cervical spondylosis 07/20/2016  . 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HCC) 02/12/2015  . Type 2 diabetes mellitus without complication (HCC) 08/10/2014  . Chronic constipation 02/19/2014  . Rosacea 11/06/2013  . GAD (generalized anxiety disorder) 06/08/2013  . Essential hypertension, benign 05/04/2013  . Rheumatoid arthritis (HCC) 05/04/2013  . Migraine with aura 05/04/2013  . Palpitations 05/04/2013  . Murmur, heart 05/04/2013  . Hyperlipidemia 05/04/2013  . Hypothyroidism 05/04/2013    Lasha Echeverria 07/02/2013 PT, MPH  07/15/2017, 3:30 PM  [facility] [address] [place] [address] [place], [place], [place] Phone: [phone]   Fax:  [phone]  Name: Joann Maddox MRN: Lowry Ram Date of Birth:  1940-07-22

## 2017-07-21 ENCOUNTER — Encounter: Payer: Medicare Other | Admitting: Rehabilitative and Restorative Service Providers"

## 2017-07-26 ENCOUNTER — Other Ambulatory Visit: Payer: Self-pay | Admitting: Family Medicine

## 2017-07-28 ENCOUNTER — Ambulatory Visit: Payer: Medicare Other | Admitting: Rehabilitative and Restorative Service Providers"

## 2017-07-28 ENCOUNTER — Encounter: Payer: Self-pay | Admitting: Rehabilitative and Restorative Service Providers"

## 2017-07-28 DIAGNOSIS — M542 Cervicalgia: Secondary | ICD-10-CM

## 2017-07-28 DIAGNOSIS — G8929 Other chronic pain: Secondary | ICD-10-CM | POA: Diagnosis not present

## 2017-07-28 DIAGNOSIS — R531 Weakness: Secondary | ICD-10-CM

## 2017-07-28 DIAGNOSIS — M25512 Pain in left shoulder: Secondary | ICD-10-CM | POA: Diagnosis not present

## 2017-07-28 DIAGNOSIS — R29898 Other symptoms and signs involving the musculoskeletal system: Secondary | ICD-10-CM

## 2017-07-28 NOTE — Therapy (Signed)
[facility] [place] [address] [place], [place], [address] Phone: [phone]   Fax:  [phone]  Physical Therapy Treatment  Patient Details  Name: Joann Maddox MRN: [id number] Date of Birth: 1940/10/29 Referring Provider: Dr Linford Arnold   Encounter Date: 07/28/2017  PT End of Session - 07/28/17 1115    Visit Number  8    Number of Visits  18    Date for PT Re-Evaluation  08/25/17    PT Start Time  1107    PT Stop Time  1202    PT Time Calculation (min)  55 min    Activity Tolerance  Patient tolerated treatment well       Past Medical History:  Diagnosis Date  . Anxiety   . Headache(784.0)    migrains  . Heart murmur   . Hypothyroidism   . Rheumatoid arthritis(714.0)     Past Surgical History:  Procedure Laterality Date  . ABDOMINAL HYSTERECTOMY    . APPENDECTOMY    . CHOLECYSTECTOMY    . REPLACEMENT TOTAL KNEE BILATERAL    . TONSILLECTOMY     as  a child  . TOTAL HIP ARTHROPLASTY     right and left    There were no vitals filed for this visit.      [facility] PT Assessment - 07/28/17 0001      Assessment   Medical Diagnosis  cervical dysfunction; Lt shoulder pain     Referring Provider  Dr Linford Arnold    Onset Date/Surgical Date  12/26/16 symptoms chronic in nature for several years     Hand Dominance  Right    Next MD Visit  07/06/17      Posture/Postural Control   Posture Comments  head forward; shoulders rounded and elevated; scaopulae abducted and rotated along the thoracic wall; head of the humerus anterior in orientation      AROM   Right Shoulder Extension  66 Degrees    Right Shoulder Flexion  117 Degrees    Right Shoulder ABduction  110 Degrees    Right Shoulder Internal Rotation  50 Degrees    Right Shoulder External Rotation  55 Degrees    Left Shoulder Extension  52 Degrees    Left Shoulder Flexion  92 Degrees Painful w/ patterns of substitution/poor scapular control     Left Shoulder  ABduction  70 Degrees Painful w/ patterns of substitution/poor scapular control     Cervical Flexion  54    Cervical Extension  36    Cervical - Right Side Bend  39    Cervical - Left Side Bend  28    Cervical - Right Rotation  41    Cervical - Left Rotation  42      Strength   Overall Strength  -- Rt shoulder 4//5 to 4+/5; Lt shoulder 4-/5 to 4/5       Palpation   Palpation comment  muscular tightness Lt > Rt upper quarter including upper traps; leveator; pecs; ant/lat/posterior cervical musculature; anterior deltoid; biceps                    [facility] Adult PT Treatment/Exercise - 07/28/17 0001      Neck Exercises: Seated   Neck Retraction  5 reps;10 secs    Shoulder Rolls  Backwards;10 reps      Shoulder Exercises: Seated   Other Seated Exercises  axial extension 15 sec hold x 5     Other Seated Exercises  shoulder rolls x 10 backwards       Shoulder Exercises: Standing   Extension  Strengthening;Right;Left;20 reps;Theraband    Theraband Level (Shoulder Extension)  Level 2 (Red)    Row  Strengthening;Right;Left;20 reps;Theraband    Theraband Level (Shoulder Row)  Level 2 (Red)    Retraction  Strengthening;Right;Left;20 reps;Theraband    Theraband Level (Shoulder Retraction)  Level 2 (Red) holding Lt UE still moving Rt       Shoulder Exercises: Pulleys   Flexion  2 minutes    ABduction  2 minutes      Moist Heat Therapy   Number Minutes Moist Heat  20 Minutes    Moist Heat Location  Cervical;Shoulder      Electrical Stimulation   Electrical Stimulation Location  Lt lateral cervical; Lt shoulder     Electrical Stimulation Action  IFC    Electrical Stimulation Parameters  to tolerance    Electrical Stimulation Goals  Pain;Tone      Manual Therapy   Manual therapy comments  pt sitting     Soft tissue mobilization  Lt lateral and posterior cervical and upper thoracic musculature; upper trap; leveator; posterior deltoid;        Trigger Point Dry Needling -  07/28/17 1158    Consent Given?  Yes    Muscles Treated Upper Body  -- Lt lateral cervical/upper thoracic & upper trap with estim     Upper Trapezius Response  Palpable increased muscle length    Levator Scapulae Response  Palpable increased muscle length    Longissimus Response  Palpable increased muscle length             PT Short Term Goals - 07/15/17 1447      PT SHORT TERM GOAL #1   Title  Education re-sleeping position with patient verbalizing understanding of importance of head support when in recliner 06/29/17    Time  4    Period  Weeks    Status  On-going      PT SHORT TERM GOAL #2   Title  Initiate UE ROM and strengthening exercises with patient to report at least 15 min of exercise each day 06/29/17    Time  4    Period  Weeks    Status  On-going        PT Long Term Goals - 07/28/17 1115      PT LONG TERM GOAL #1   Title  Increase AROM cervical spine by 5-10 degrees in all planes 08/25/17    Time  12    Period  Weeks    Status  On-going      PT LONG TERM GOAL #2   Title  Increase AROM Lt shoudler by 5-15 degrees in flexion and abduction 08/25/17    Time  12    Period  Weeks    Status  On-going      PT LONG TERM GOAL #3   Title  Increase strength Lt/Rt shoulders allowing patient to use UE's for more functional activities including combing hair, washing face, etc 08/25/17     Time  12    Period  Weeks    Status  On-going      PT LONG TERM GOAL #4   Title  patient reports performing some exercise for neck and shoulders at least once daily 08/25/17    Time  12    Period  Weeks    Status  On-going      PT LONG TERM  GOAL #5   Title  Independent in HEP 08/25/17    Time  12    Period  Weeks    Status  On-going      PT LONG TERM GOAL #6   Title  Improve FOTO to </= 50% limitation 08/25/17    Time  12    Period  Weeks    Status  On-going            Plan - 07/28/17 1159    Clinical Impression Statement  Patient has not felt well for the past week. She  has had generalized body aches and pains. She has not felt lik doing her HEP. Joann Maddox does now have a pulley for home and has used that some. She has therabands for strengthening in sitting and standing. Joann Maddox responds well to DN/manual work and modalities. Cervical and shoulder ROM have improved some since initial evaluation but remain limited and painful. She has continued muscular tightness to palpation in Lt upper quadrant. Joann Maddox will benefit from continued therapy to progress with rehab goals.     Rehab Potential  Good    Clinical Impairments Affecting Rehab Potential  multiple co-morbities     PT Frequency  2x / week    PT Duration  12 weeks    PT Treatment/Interventions  Patient/family education;ADLs/Self Care Home Management;Cryotherapy;Electrical Stimulation;Iontophoresis 4mg /ml Dexamethasone;Moist Heat;Ultrasound;Dry needling;Manual techniques;Therapeutic activities;Therapeutic exercise    PT Next Visit Plan  postural exercises; gentle stretching; pulleys for UE ROM; posterior shoulder girdle strengthening; manual therapy; DN; modalities as indicated     Consulted and Agree with Plan of Care  Patient       Patient will benefit from skilled therapeutic intervention in order to improve the following deficits and impairments:  Postural dysfunction, Improper body mechanics, Pain, Increased fascial restricitons, Increased muscle spasms, Decreased range of motion, Decreased mobility, Decreased strength, Impaired UE functional use, Decreased activity tolerance  Visit Diagnosis: Cervicalgia  Chronic left shoulder pain  Other symptoms and signs involving the musculoskeletal system  Weakness generalized     Problem List Patient Active Problem List   Diagnosis Date Noted  . Chronic venous insufficiency 04/29/2017  . Cervical spondylosis 07/20/2016  . 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HCC) 02/12/2015  . Type 2 diabetes  mellitus without complication (HCC) 08/10/2014  . Chronic constipation 02/19/2014  . Rosacea 11/06/2013  . GAD (generalized anxiety disorder) 06/08/2013  . Essential hypertension, benign 05/04/2013  . Rheumatoid arthritis (HCC) 05/04/2013  . Migraine with aura 05/04/2013  . Palpitations 05/04/2013  . Murmur, heart 05/04/2013  . Hyperlipidemia 05/04/2013  . Hypothyroidism 05/04/2013    Michala Deblanc Rober Minion PT, MPH  07/28/2017, 12:04 PM  [facility] [address] [employer] [address] [place], [place], [address] Phone: [phone]   Fax:  [phone]  Name: ANQUANETTE BIONDO MRN: [id number] Date of Birth: 1941/04/02

## 2017-07-29 ENCOUNTER — Encounter: Payer: Self-pay | Admitting: Family Medicine

## 2017-07-29 ENCOUNTER — Ambulatory Visit (INDEPENDENT_AMBULATORY_CARE_PROVIDER_SITE_OTHER): Payer: Medicare Other | Admitting: Family Medicine

## 2017-07-29 ENCOUNTER — Other Ambulatory Visit: Payer: Self-pay | Admitting: *Deleted

## 2017-07-29 ENCOUNTER — Other Ambulatory Visit: Payer: Self-pay | Admitting: Family Medicine

## 2017-07-29 VITALS — BP 117/61 | HR 77 | Ht 60.0 in | Wt 205.0 lb

## 2017-07-29 DIAGNOSIS — F4321 Adjustment disorder with depressed mood: Secondary | ICD-10-CM | POA: Diagnosis not present

## 2017-07-29 DIAGNOSIS — E119 Type 2 diabetes mellitus without complications: Secondary | ICD-10-CM

## 2017-07-29 DIAGNOSIS — M25512 Pain in left shoulder: Secondary | ICD-10-CM | POA: Diagnosis not present

## 2017-07-29 DIAGNOSIS — M21612 Bunion of left foot: Secondary | ICD-10-CM

## 2017-07-29 LAB — POCT GLYCOSYLATED HEMOGLOBIN (HGB A1C): Hemoglobin A1C: 5.7

## 2017-07-29 MED ORDER — DULOXETINE HCL 60 MG PO CPEP: 60.0000 mg | ORAL_CAPSULE | Freq: Every day | ORAL | 2 refills | Status: DC

## 2017-07-29 NOTE — Progress Notes (Signed)
Subjective:    CC: DM   HPI:  Diabetes - no hypoglycemic events. No wounds or sores that are not healing well. No increased thirst or urination. Checking glucose at home. Taking medications as prescribed without any side effects.  He also complains of a bunion on her left foot.  She says is been getting more painful and more tender.  She really only has 1 pair of shoes that she is able to wear without causing significant discomfort but she does not want to have any surgical treatment.  She was able to get the name of a biotech company here in [place] that might be able to do a shoe modification for her.  Her brother that she was close to died about 2 months ago.  She find herself crying spontaneously. She did go up [place] for his funeral.    She says her arms have been very painful, esp her left shoulder.  She has been going to PT and they are going to request additional visits.  She is able to life her shoulder to 90 degrees.  She is not using any rub. She has been iscing it.    Past medical history, Surgical history, Family history not pertinant except as noted below, Social history, Allergies, and medications have been entered into the medical record, reviewed, and corrections made.   Review of Systems: No fevers, chills, night sweats, weight loss, chest pain, or shortness of breath.   Objective:    General: Well Developed, well nourished, and in no acute distress.  Neuro: Alert and oriented x3, extra-ocular muscles intact, sensation grossly intact.  HEENT: Normocephalic, atraumatic  Skin: Warm and dry, no rashes. Cardiac: Regular rate and rhythm, no murmurs rubs or gallops, no lower extremity edema.  Respiratory: Clear to auscultation bilaterally. Not using accessory muscles, speaking in full sentences.   Impression and Recommendations:    DM -really well controlled.  Hemoglobin A1c of 5.7 which looks fantastic.  Left bunion - will be happy to refer to [employer] for assistance with  getting a shoe that will fit well.   Grief - I can refer to counseling if needed.    Left shoulder pain - continue PT. She really needs surgery but doesn't want to do that.  Recommend she try her Biofreeze on it.

## 2017-08-17 ENCOUNTER — Other Ambulatory Visit: Payer: Self-pay | Admitting: Family Medicine

## 2017-08-19 ENCOUNTER — Encounter: Payer: Self-pay | Admitting: Family Medicine

## 2017-08-19 ENCOUNTER — Other Ambulatory Visit: Payer: Self-pay | Admitting: Family Medicine

## 2017-08-24 ENCOUNTER — Telehealth: Payer: Self-pay | Admitting: Family Medicine

## 2017-08-24 NOTE — Telephone Encounter (Signed)
Pt advised of results. Pt guesses that her last sleep study was about 4 years ago in a lab in [place], but she is not positive.  States she has an appt with Dr Linford Arnold on Thursday this week and is ok to discuss a sleep study at that time.

## 2017-08-24 NOTE — Telephone Encounter (Signed)
Call patient: Overnight pulse oximetry did show that she does drop her oxygen level below 89% for significant amount of time overnight.  I do believe that this could be contributing to a lot of the daytime fatigue that she is been experiencing.  Remember if she is ever had a sleep study for sleep apnea but I would like to consider doing that as well if she has not had that done or if it has been a very long time since she is had it done.  At a minimum she would qualify for overnight oxygen but also her make sure that she does not need CPAP for sleep apnea which is more pressurized oxygen.  Nani Gasser, MD

## 2017-08-25 ENCOUNTER — Ambulatory Visit: Payer: Medicare Other | Admitting: Rehabilitative and Restorative Service Providers"

## 2017-08-25 DIAGNOSIS — R531 Weakness: Secondary | ICD-10-CM

## 2017-08-25 DIAGNOSIS — M25512 Pain in left shoulder: Secondary | ICD-10-CM | POA: Diagnosis not present

## 2017-08-25 DIAGNOSIS — R29898 Other symptoms and signs involving the musculoskeletal system: Secondary | ICD-10-CM

## 2017-08-25 DIAGNOSIS — G8929 Other chronic pain: Secondary | ICD-10-CM | POA: Diagnosis not present

## 2017-08-25 DIAGNOSIS — M542 Cervicalgia: Secondary | ICD-10-CM | POA: Diagnosis not present

## 2017-08-25 NOTE — Therapy (Signed)
[facility] [place] [address] [place], [place], [address] Phone: [phone]   Fax:  [phone]  Physical Therapy Treatment  Patient Details  Name: MICAL BRUN MRN: [id number] Date of Birth: February 19, 1941 Referring Provider: Dr Linford Arnold   Encounter Date: 08/25/2017  PT End of Session - 08/25/17 1437    Visit Number  9    Number of Visits  18    Date for PT Re-Evaluation  08/25/17    PT Start Time  1435       Past Medical History:  Diagnosis Date  . Anxiety   . Headache(784.0)    migrains  . Heart murmur   . Hypothyroidism   . Rheumatoid arthritis(714.0)     Past Surgical History:  Procedure Laterality Date  . ABDOMINAL HYSTERECTOMY    . APPENDECTOMY    . CHOLECYSTECTOMY    . REPLACEMENT TOTAL KNEE BILATERAL    . TONSILLECTOMY     as  a child  . TOTAL HIP ARTHROPLASTY     right and left    There were no vitals filed for this visit.  Subjective Assessment - 08/25/17 1439    Subjective  Patient reports that she has experienced significant increase in pain in her arms in the past two weeks with more noticable weakness in her shouders as well as neck pain. She has been unable to do anyhing with her arms. She was travelling 3 weeks ago - and has been home for just over a week. Shoulders and neck continue to hurt.     Currently in Pain?  Yes    Pain Score  8     Pain Location  Neck    Pain Orientation  Left    Pain Descriptors / Indicators  Aching;Dull    Pain Type  Chronic pain    Pain Onset  More than a month ago    Pain Frequency  Constant    Pain Score  8    Pain Location  Shoulder    Pain Orientation  Left    Pain Descriptors / Indicators  Aching;Dull;Tightness    Pain Type  Chronic pain    Pain Frequency  Intermittent    Aggravating Factors   any activity with arms; turning head; looking up     Pain Relieving Factors  DN; TENS; ice; heat; meds          [facility] PT Assessment - 08/25/17 0001       Assessment   Medical Diagnosis  cervical dysfunction; Lt shoulder pain     Referring Provider  Dr Linford Arnold    Onset Date/Surgical Date  12/26/16 symptoms chronic in nature for several years     Hand Dominance  Right    Next MD Visit  08/26/17      Posture/Postural Control   Posture Comments  head forward; shoulders rounded and elevated; scaopulae abducted and rotated along the thoracic wall; head of the humerus anterior in orientation      AROM   Right Shoulder Extension  58 Degrees    Right Shoulder Flexion  110 Degrees    Right Shoulder ABduction  68 Degrees    Right Shoulder Internal Rotation  47 Degrees    Right Shoulder External Rotation  57 Degrees    Left Shoulder Extension  46 Degrees    Left Shoulder Flexion  68 Degrees Painful w/ patterns of substitution/poor scapular control     Left Shoulder ABduction  60 Degrees Painful w/ patterns  of substitution/poor scapular control     Cervical Flexion  46    Cervical Extension  23    Cervical - Right Side Bend  25    Cervical - Left Side Bend  18    Cervical - Right Rotation  36    Cervical - Left Rotation  37      Palpation   Palpation comment  muscular tightness Lt > Rt upper quarter including upper traps; leveator; pecs; ant/lat/posterior cervical musculature; anterior deltoid; biceps                    [facility] Adult PT Treatment/Exercise - 08/25/17 0001      Neck Exercises: Seated   Neck Retraction  5 reps;10 secs    Shoulder Rolls  Backwards;10 reps      Shoulder Exercises: Seated   Other Seated Exercises  axial extension 15 sec hold x 5     Other Seated Exercises  shoulder rolls x 10 backwards       Shoulder Exercises: Pulleys   Flexion  2 minutes    ABduction  2 minutes      Moist Heat Therapy   Number Minutes Moist Heat  20 Minutes    Moist Heat Location  Cervical;Shoulder      Electrical Stimulation   Electrical Stimulation Location  Lt lateral cervical; Lt shoulder     Electrical Stimulation Action   IFC    Electrical Stimulation Parameters  to tolerance    Electrical Stimulation Goals  Pain;Tone      Manual Therapy   Manual therapy comments  pt sitting     Soft tissue mobilization  Lt lateral and posterior cervical and upper thoracic musculature; upper trap; leveator; posterior deltoid;        Trigger Point Dry Needling - 08/25/17 1656    Consent Given?  Yes    Muscles Treated Upper Body  -- Lt lateral cervical - scaleni with estim     Upper Trapezius Response  Palpable increased muscle length    Levator Scapulae Response  Palpable increased muscle length    Longissimus Response  Palpable increased muscle length           PT Education - 08/25/17 1658    Education provided  Yes    Education Details  encouraged patient to use pulley for home; resume exercises     Person(s) Educated  Patient    Methods  Explanation    Comprehension  Verbalized understanding       PT Short Term Goals - 07/15/17 1447      PT SHORT TERM GOAL #1   Title  Education re-sleeping position with patient verbalizing understanding of importance of head support when in recliner 06/29/17    Time  4    Period  Weeks    Status  On-going      PT SHORT TERM GOAL #2   Title  Initiate UE ROM and strengthening exercises with patient to report at least 15 min of exercise each day 06/29/17    Time  4    Period  Weeks    Status  On-going        PT Long Term Goals - 07/28/17 1115      PT LONG TERM GOAL #1   Title  Increase AROM cervical spine by 5-10 degrees in all planes 08/25/17    Time  12    Period  Weeks    Status  On-going  PT LONG TERM GOAL #2   Title  Increase AROM Lt shoudler by 5-15 degrees in flexion and abduction 08/25/17    Time  12    Period  Weeks    Status  On-going      PT LONG TERM GOAL #3   Title  Increase strength Lt/Rt shoulders allowing patient to use UE's for more functional activities including combing hair, washing face, etc 08/25/17     Time  12    Period  Weeks     Status  On-going      PT LONG TERM GOAL #4   Title  patient reports performing some exercise for neck and shoulders at least once daily 08/25/17    Time  12    Period  Weeks    Status  On-going      PT LONG TERM GOAL #5   Title  Independent in HEP 08/25/17    Time  12    Period  Weeks    Status  On-going      PT LONG TERM GOAL #6   Title  Improve FOTO to </= 50% limitation 08/25/17    Time  12    Period  Weeks    Status  On-going              Patient will benefit from skilled therapeutic intervention in order to improve the following deficits and impairments:     Visit Diagnosis: Cervicalgia - Plan: PT plan of care cert/re-cert  Chronic left shoulder pain - Plan: PT plan of care cert/re-cert  Other symptoms and signs involving the musculoskeletal system - Plan: PT plan of care cert/re-cert  Weakness generalized - Plan: PT plan of care cert/re-cert     Problem List Patient Active Problem List   Diagnosis Date Noted  . Chronic venous insufficiency 04/29/2017  . Cervical spondylosis 07/20/2016  . 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HCC) 02/12/2015  . Type 2 diabetes mellitus without complication (HCC) 08/10/2014  . Chronic constipation 02/19/2014  . Rosacea 11/06/2013  . GAD (generalized anxiety disorder) 06/08/2013  . Essential hypertension, benign 05/04/2013  . Rheumatoid arthritis (HCC) 05/04/2013  . Migraine with aura 05/04/2013  . Palpitations 05/04/2013  . Murmur, heart 05/04/2013  . Hyperlipidemia 05/04/2013  . Hypothyroidism 05/04/2013  . Fatigue 02/16/2012  . Obesity, morbid (HCC) 02/16/2012  . Urge incontinence 02/16/2012  . Hyperglycemia 01/12/2012    Garik Diamant Rober Minion PT, MPH  08/25/2017, 5:03 PM  [facility] [address] [place], [place], [address] Phone: [phone]   Fax:  [phone]  Name: NAAIRAH CROWE MRN:  [id number] Date of Birth: [birth year]

## 2017-08-26 ENCOUNTER — Ambulatory Visit (INDEPENDENT_AMBULATORY_CARE_PROVIDER_SITE_OTHER): Payer: Medicare Other | Admitting: Family Medicine

## 2017-08-26 ENCOUNTER — Encounter: Payer: Self-pay | Admitting: Family Medicine

## 2017-08-26 VITALS — BP 106/40 | HR 66 | Ht 60.0 in | Wt 206.0 lb

## 2017-08-26 DIAGNOSIS — M4722 Other spondylosis with radiculopathy, cervical region: Secondary | ICD-10-CM

## 2017-08-26 DIAGNOSIS — M069 Rheumatoid arthritis, unspecified: Secondary | ICD-10-CM | POA: Diagnosis not present

## 2017-08-26 DIAGNOSIS — G4734 Idiopathic sleep related nonobstructive alveolar hypoventilation: Secondary | ICD-10-CM

## 2017-08-26 DIAGNOSIS — E559 Vitamin D deficiency, unspecified: Secondary | ICD-10-CM | POA: Diagnosis not present

## 2017-08-26 MED ORDER — AMBULATORY NON FORMULARY MEDICATION: 1 refills | Status: DC

## 2017-08-26 NOTE — Progress Notes (Signed)
Subjective:    Patient ID: Joann Maddox, female    DOB: [birth year], 77 y.o.   MRN: [id number]  HPI She also wants to follow-up because she still having persistent neck and shoulder pain.  She had an MRI back in 2017 of the cervical spine by Dr. Alben Deeds her rheumatologist.  MRI revealed multilevel cervical spondylosis and upper thoracic degenerative changes particularly worse at C5-6 and C6-7 on the left side.  She has been having some pain and numbness tingling going down that left arm recently.   Follow-up overnight hypoxemia-we did an overnight oxygen study and it was significant for dropping her oxygen below 88% for a significant amount of time.  She has been tested for sleep apnea in the past and actually tested positive but really could not tolerate the CPAP and so has not been using it.  Follow-up rheumatoid arthritis-she has not seen Dr. Dierdre Forth in almost a year and was previously on methotrexate injections.  She is continued to have a lot of pain and swelling and stiffness.  She wonders if she might benefit from a consultation at the [facility].  Review of Systems  BP (!) 106/40   Pulse 66   Ht 5' (1.524 m)   Wt 206 lb (93.4 kg)   SpO2 98%   BMI 40.23 kg/m     Allergies  Allergen Reactions  . Iodine Other (See Comments)    Shook violently and passed out.  . Codeine Other (See Comments)    Hallucinations  . Lipitor [Atorvastatin] Other (See Comments)    Myalgias    Past Medical History:  Diagnosis Date  . Anxiety   . Headache(784.0)    migrains  . Heart murmur   . Hypothyroidism   . Rheumatoid arthritis(714.0)     Past Surgical History:  Procedure Laterality Date  . ABDOMINAL HYSTERECTOMY    . APPENDECTOMY    . CHOLECYSTECTOMY    . REPLACEMENT TOTAL KNEE BILATERAL    . TONSILLECTOMY     as  a child  . TOTAL HIP ARTHROPLASTY     right and left    Social History   Socioeconomic History  . Marital status: Married    Spouse name: Dorene Sorrow  . Number  of children: 2  . Years of education: Not on file  . Highest education level: Not on file  Occupational History  . Occupation: Retired Runner, broadcasting/film/video    Comment: Corporate treasurer  Social Needs  . Financial resource strain: Not on file  . Food insecurity:    Worry: Not on file    Inability: Not on file  . Transportation needs:    Medical: Not on file    Non-medical: Not on file  Tobacco Use  . Smoking status: Never Smoker  . Smokeless tobacco: Never Used  Substance and Sexual Activity  . Alcohol use: No  . Drug use: Not on file  . Sexual activity: Not on file  Lifestyle  . Physical activity:    Days per week: Not on file    Minutes per session: Not on file  . Stress: Not on file  Relationships  . Social connections:    Talks on phone: Not on file    Gets together: Not on file    Attends religious service: Not on file    Active member of club or organization: Not on file    Attends meetings of clubs or organizations: Not on file    Relationship status: Not on  file  . Intimate partner violence:    Fear of current or ex partner: Not on file    Emotionally abused: Not on file    Physically abused: Not on file    Forced sexual activity: Not on file  Other Topics Concern  . Not on file  Social History Narrative   No regular exercise. 2 caffeine drinks per day.     Family History  Problem Relation Age of Onset  . Heart disease Father 51  . Diabetes Unknown        aunt   . Stroke Mother 72    Outpatient Encounter Medications as of 08/26/2017  Medication Sig  . AZELEX 20 % cream APPLY TOPICALL 2 TIMES A DAY (MORNING AND EVENING). APPLY AFTER SKIN IS WASHED AND PATTED DRY.  . butalbital-acetaminophen-caffeine (FIORICET, ESGIC) 50-325-40 MG tablet TAKE 1 TABLET BY MOUTH TWICE A DAY AS NEEDED  . DULoxetine (CYMBALTA) 60 MG capsule Take 1 capsule (60 mg total) by mouth daily.  . folic acid (FOLVITE) 1 MG tablet TAKE 1 TABLET BY MOUTH EVERY DAY  . furosemide (LASIX) 20 MG tablet  TAKE 1 TABLET BY MOUTH EVERY DAY AS NEEDED  . hydrocortisone valerate cream (WESTCORT) 0.2 % Apply 1 application topically daily as needed.  . hydroxychloroquine (PLAQUENIL) 200 MG tablet 2 TABLET WITH FOOD OR MILK ONCE A DAY ORALLY  . KLOR-CON 10 10 MEQ tablet TAKE 1 TABLET (10 MEQ TOTAL) BY MOUTH 2 (TWO) TIMES DAILY.  Marland Kitchen levothyroxine (SYNTHROID, LEVOTHROID) 25 MCG tablet Take 1 tablet (25 mcg total) by mouth daily.  Marland Kitchen lisinopril-hydrochlorothiazide (PRINZIDE,ZESTORETIC) 20-25 MG tablet TAKE 1 TABLET BY MOUTH EVERY DAY  . meloxicam (MOBIC) 15 MG tablet TAKE ONE TABLET BY MOUTH EACH AM WITH BREAKFAST AS NEEDED FOR PAIN  . metFORMIN (GLUCOPHAGE) 500 MG tablet TAKE 1 TABLET BY MOUTH 2 TIMES DAILY WITH A MEAL.  . methotrexate 50 MG/2ML injection 1 ML WEEKLY INJECTION 30 DAYS  . Multiple Vitamin (MULTIVITAMIN) tablet Take 1 tablet by mouth daily.  Marland Kitchen MYRBETRIQ 25 MG TB24 tablet TAKE 1 TABLET BY MOUTH EVERY DAY  . nystatin (NYSTATIN) powder Apply 1 g topically 2 (two) times daily. X 3 weeks.  Marland Kitchen PROAIR HFA 108 (90 Base) MCG/ACT inhaler Inhale 2 puffs into the lungs every 4 (four) hours as needed.  . rosuvastatin (CRESTOR) 10 MG tablet TAKE 1 TABLET (10 MG TOTAL) BY MOUTH DAILY.  Marland Kitchen senna (SENOKOT) 8.6 MG tablet Take 1 tablet by mouth as needed.   Marland Kitchen tiZANidine (ZANAFLEX) 4 MG tablet Take 0.5-1 tablets (2-4 mg total) by mouth every 8 (eight) hours as needed for muscle spasms.  . traMADol (ULTRAM) 50 MG tablet TAKE 1 TO 2 TABLETS BY MOUTH EVERY 12 HOURS AS NEEDED FOR PAIN  . vitamin B-12 (CYANOCOBALAMIN) 100 MCG tablet Take 50 mcg by mouth daily.  . Vitamin D, Ergocalciferol, (DRISDOL) 50000 units CAPS capsule TAKE 1 CAPSULE (50,000 UNITS TOTAL) BY MOUTH ONCE A WEEK.  . AMBULATORY NON FORMULARY MEDICATION Medication Name: Overnight oxygen set to 2 liters.  Dx: nocturnal Hypoxemia. Fax to [employer].  . [DISCONTINUED] AMBULATORY NON FORMULARY MEDICATION Medication Name: Overnight pulse oximetry.  Symptoms  include daytime somnolence, waking with headaches, poor sleep quality, obesity.  . [DISCONTINUED] AMBULATORY NON FORMULARY MEDICATION Med Name: Natures Plus Garcinia [place] 1000 Mg  . [DISCONTINUED] potassium chloride (KLOR-CON M10) 10 MEQ tablet TAKE 1 TABLET (10 MEQ TOTAL) BY MOUTH 2 (TWO) TIMES DAILY.   No facility-administered encounter medications on file  as of 08/26/2017.          Objective:   Physical Exam  Constitutional: She is oriented to person, place, and time. She appears well-developed and well-nourished.  HENT:  Head: Normocephalic and atraumatic.  Cardiovascular: Normal rate, regular rhythm and normal heart sounds.  Pulmonary/Chest: Effort normal and breath sounds normal.  Neurological: She is alert and oriented to person, place, and time.  Skin: Skin is warm and dry.  Psychiatric: She has a normal mood and affect. Her behavior is normal.        Assessment & Plan:  RA -she has not been on her methotrexate for almost a year and has not seen Dr. Dierdre Forth since then.  She said at that point in time he was not sure what else he could really offer her because he had tried 70 things.  We discussed today that there are several new Biologics on the market.  I am not sure whether or not she would be a candidate or not but I would encourage her to get back in with Dr. Kathyrn Lass and decide what to do.  Hypoxemia-discussed options.  At this point in time she is hesitant to retry the CPAP but is at least willing to wear the oxygen which would help somewhat with the hypoxemia but will not completely correct her sleep apnea and we have discussed that today.  She tolerates the oxygen well then consider retrial of CPAP in the future.  Degenerative cervical spine with radiculopathy-at this point I think she would probably benefit from a consultation with neurosurgery.  She is had multiple injections as well as physical therapy at this point in time her pain is been progressing and is now  radiating down her arms.  She wants to get back in with Dr. Dierdre Forth first and discuss things with him.  But I will be happy to place referral if she would like.  Continue with PT for now as she has gotten some improvement with range of motion but not real significant improvement in her pain levels.

## 2017-08-27 ENCOUNTER — Encounter: Payer: Self-pay | Admitting: Rehabilitative and Restorative Service Providers"

## 2017-08-27 ENCOUNTER — Ambulatory Visit: Payer: Medicare Other | Admitting: Rehabilitative and Restorative Service Providers"

## 2017-08-27 ENCOUNTER — Telehealth: Payer: Self-pay

## 2017-08-27 DIAGNOSIS — M25512 Pain in left shoulder: Secondary | ICD-10-CM

## 2017-08-27 DIAGNOSIS — R531 Weakness: Secondary | ICD-10-CM

## 2017-08-27 DIAGNOSIS — R29898 Other symptoms and signs involving the musculoskeletal system: Secondary | ICD-10-CM | POA: Diagnosis not present

## 2017-08-27 DIAGNOSIS — M542 Cervicalgia: Secondary | ICD-10-CM

## 2017-08-27 DIAGNOSIS — G8929 Other chronic pain: Secondary | ICD-10-CM

## 2017-08-27 NOTE — Telephone Encounter (Signed)
Fax confirmation received. 

## 2017-08-27 NOTE — Telephone Encounter (Signed)
Fax sent to [facility] including RX for overnight oxygen, last OV, demographics, and ins card.  I was not able to attach a copy of recent overnight oxygen study due to it being sent to be scanned to chart.

## 2017-08-27 NOTE — Therapy (Addendum)
[facility] [address] [place], [place], [address] Phone: [phone]   Fax:  [phone]  Physical Therapy Treatment  Patient Details  Name: Joann Maddox MRN: [id number] Date of Birth: 10/15/1940 Referring Provider: Dr Linford Arnold    Encounter Date: 08/27/2017  PT End of Session - 08/27/17 1404    Visit Number  10    Number of Visits  18    Date for PT Re-Evaluation  10/08/17    PT Start Time  1401    PT Stop Time  1500    PT Time Calculation (min)  59 min    Activity Tolerance  Patient tolerated treatment well       Past Medical History:  Diagnosis Date  . Anxiety   . Headache(784.0)    migrains  . Heart murmur   . Hypothyroidism   . Rheumatoid arthritis(714.0)     Past Surgical History:  Procedure Laterality Date  . ABDOMINAL HYSTERECTOMY    . APPENDECTOMY    . CHOLECYSTECTOMY    . REPLACEMENT TOTAL KNEE BILATERAL    . TONSILLECTOMY     as  a child  . TOTAL HIP ARTHROPLASTY     right and left    There were no vitals filed for this visit.  Subjective Assessment - 08/27/17 1410    Subjective  Feeling better than Wednesday but not as good as yesterday. The DN helped Wednesday. Seem by Dr Linford Arnold yesterday and will be referred to rheumatologist for re-evaluation for new medication options. She will also start use of O2 for night due to drop in oxygen level at night.     Currently in Pain?  Yes    Pain Score  7     Pain Location  Neck    Pain Orientation  Left    Pain Descriptors / Indicators  Aching;Dull    Pain Type  Chronic pain    Multiple Pain Sites  Yes    Pain Score  7    Pain Location  Shoulder    Pain Orientation  Left    Pain Descriptors / Indicators  Aching;Dull;Tightness    Pain Type  Chronic pain         [facility] PT Assessment - 08/27/17 0001      Assessment   Medical Diagnosis  cervical dysfunction; Lt shoulder pain     Referring Provider  Dr Linford Arnold     Onset Date/Surgical Date   12/26/16 symptoms chronic in nature for several years     Hand Dominance  Right    Next MD Visit  8/19      Observation/Other Assessments   Focus on Therapeutic Outcomes (FOTO)   61% limitation       Posture/Postural Control   Posture Comments  head forward; shoulders rounded and elevated; scaopulae abducted and rotated along the thoracic wall; head of the humerus anterior in orientation      AROM   Right Shoulder Extension  58 Degrees    Right Shoulder Flexion  110 Degrees    Right Shoulder ABduction  68 Degrees    Right Shoulder Internal Rotation  47 Degrees    Right Shoulder External Rotation  57 Degrees    Left Shoulder Extension  46 Degrees    Left Shoulder Flexion  68 Degrees Painful w/ patterns of substitution/poor scapular control     Left Shoulder ABduction  60 Degrees Painful w/ patterns of substitution/poor scapular control     Cervical Flexion  46    Cervical Extension  23    Cervical - Right Side Bend  25    Cervical - Left Side Bend  18    Cervical - Right Rotation  36    Cervical - Left Rotation  37      Palpation   Palpation comment  muscular tightness Lt > Rt upper quarter including upper traps; leveator; pecs; ant/lat/posterior cervical musculature; anterior deltoid; biceps                    [facility] Adult PT Treatment/Exercise - 08/27/17 0001      Neck Exercises: Seated   Neck Retraction  5 reps;10 secs    Shoulder Rolls  Backwards;10 reps      Shoulder Exercises: Seated   Other Seated Exercises  axial extension 15 sec hold x 5     Other Seated Exercises  shoulder rolls x 10 backwards       Shoulder Exercises: Standing   Other Standing Exercises  scap squeeze with noodle 10 sec x 10; scap retraction with shoulder abduction x 10/diagonals x 10; W's x 10       Shoulder Exercises: Pulleys   Flexion  2 minutes    ABduction  2 minutes      Moist Heat Therapy   Number Minutes Moist Heat  20 Minutes    Moist Heat Location  Cervical;Shoulder       Electrical Stimulation   Electrical Stimulation Location  Lt lateral cervical; Lt shoulder     Electrical Stimulation Action  IFC    Electrical Stimulation Parameters  to tolerance    Electrical Stimulation Goals  Pain;Tone      Manual Therapy   Manual therapy comments  pt sitting     Soft tissue mobilization  Lt lateral and posterior cervical and upper thoracic musculature; upper trap; leveator; posterior deltoid;        Trigger Point Dry Needling - 08/27/17 1430    Consent Given?  Yes    Muscles Treated Upper Body  -- Lt with estim - scaleni    Upper Trapezius Response  Palpable increased muscle length    Levator Scapulae Response  Palpable increased muscle length    Longissimus Response  Palpable increased muscle length             PT Short Term Goals - 08/27/17 1408      PT SHORT TERM GOAL #1   Title  Education re-sleeping position with patient verbalizing understanding of importance of head support when in recliner 06/29/17    Time  4    Period  Weeks    Status  Achieved      PT SHORT TERM GOAL #2   Title  Initiate UE ROM and strengthening exercises with patient to report at least 15 min of exercise each day 10/08/17    Time  18    Period  Weeks    Status  Revised        PT Long Term Goals - 08/27/17 1406      PT LONG TERM GOAL #1   Title  Increase AROM cervical spine by 5-10 degrees in all planes 10/08/17    Time  18    Period  Weeks    Status  Revised      PT LONG TERM GOAL #2   Title  Increase AROM Lt shoudler by 5-15 degrees in flexion and abduction 10/08/17    Time  18  Period  Weeks    Status  Revised      PT LONG TERM GOAL #3   Title  Increase strength Lt/Rt shoulders allowing patient to use UE's for more functional activities including combing hair, washing face, etc 10/08/17     Time  18    Period  Weeks    Status  Revised      PT LONG TERM GOAL #4   Title  patient reports performing some exercise for neck and shoulders at least once daily  10/08/17    Time  18    Period  Weeks    Status  Revised      PT LONG TERM GOAL #5   Title  Independent in HEP 10/08/17    Time  18    Period  Weeks    Status  Revised      PT LONG TERM GOAL #6   Title  Improve FOTO to </= 50% limitation 10/12/17    Time  18    Period  Weeks    Status  Revised            Plan - 08/27/17 1443    Clinical Impression Statement  Teyla returns today with some improvement in cervical and Lt shoulder pain. She was seen by MD yesterday and will return to rheumatologist for follow up for medical management of arthritis. She will also start use of oxygen at night for decresed oxygen levels at night. Patient has not accomplished goals of therapy due to flare up of symptoms and travel with family illness and death and for vacatioin. patient has difficulty with posture and alignment as well as positioning for car/plane travel and when sleeping at night. Patient will benefit from trial of consistent therapy to work toward long range goals of therapy. She continues to achieve temporary improvement in symptoms with treatment. Florella will benefit from continuedtreatment to accomplish goals.      Rehab Potential  Good    Clinical Impairments Affecting Rehab Potential  multiple co-morbities     PT Frequency  2x / week    PT Duration  Other (comment) 18 weeks     PT Treatment/Interventions  Patient/family education;ADLs/Self Care Home Management;Cryotherapy;Electrical Stimulation;Iontophoresis 4mg /ml Dexamethasone;Moist Heat;Ultrasound;Dry needling;Manual techniques;Therapeutic activities;Therapeutic exercise    PT Next Visit Plan  postural exercises; gentle stretching; pulleys for UE ROM; posterior shoulder girdle strengthening; manual therapy; DN; modalities as indicated     Recommended Other Services  Massage therapy on a regular basis; trigger point injections     Consulted and Agree with Plan of Care  Patient       Patient will benefit from skilled therapeutic  intervention in order to improve the following deficits and impairments:  Postural dysfunction, Improper body mechanics, Pain, Increased fascial restricitons, Increased muscle spasms, Decreased range of motion, Decreased mobility, Decreased strength, Impaired UE functional use, Decreased activity tolerance  Visit Diagnosis: Cervicalgia - Plan: PT plan of care cert/re-cert  Chronic left shoulder pain - Plan: PT plan of care cert/re-cert  Other symptoms and signs involving the musculoskeletal system - Plan: PT plan of care cert/re-cert  Weakness generalized - Plan: PT plan of care cert/re-cert     Problem List Patient Active Problem List   Diagnosis Date Noted  . Chronic venous insufficiency 04/29/2017  . Cervical spondylosis 07/20/2016  . 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HCC) 02/12/2015  . Type 2 diabetes mellitus without complication (HCC) 08/10/2014  .  Chronic constipation 02/19/2014  . Rosacea 11/06/2013  . GAD (generalized anxiety disorder) 06/08/2013  . Essential hypertension, benign 05/04/2013  . Rheumatoid arthritis (HCC) 05/04/2013  . Migraine with aura 05/04/2013  . Palpitations 05/04/2013  . Murmur, heart 05/04/2013  . Hyperlipidemia 05/04/2013  . Hypothyroidism 05/04/2013  . Fatigue 02/16/2012  . Obesity, morbid (HCC) 02/16/2012  . Urge incontinence 02/16/2012  . Hyperglycemia 01/12/2012    [name] Rober Minion PT, MPH  08/27/2017, 3:01 PM  [facility] [address] [place] [address] [place], [place], [address] Phone: [phone]   Fax:  [phone]  Name: DESHAUNA CAYSON MRN: [id number] Date of Birth: [birth year]

## 2017-08-28 LAB — VITAMIN D 25 HYDROXY (VIT D DEFICIENCY, FRACTURES): Vit D, 25-Hydroxy: 67 ng/mL (ref 30–100)

## 2017-09-02 ENCOUNTER — Telehealth: Payer: Self-pay | Admitting: *Deleted

## 2017-09-02 NOTE — Telephone Encounter (Signed)
Problem is is that she does not actually use her CPAP.  She really was not able to tolerate it.  So I thought if we can at least supplement her oxygen if she is not to wear the CPAP than that would at least be helpful even that may not completely treat her symptoms.  Please call patient and see what she would prefer to do.

## 2017-09-02 NOTE — Telephone Encounter (Signed)
James from [employer] called regarding the overnight O2 order you placed.  Since pt is Medicare and has been dx with OSA in the past, she must have a new in lab sleep study done.  If she desats during the study then they will bleed in O2 through CPAP.  If she still desats with that, then she will qualify for O2 therapy through the CPAP.

## 2017-09-03 NOTE — Telephone Encounter (Signed)
Left VM for Pt to return clinic call.  

## 2017-09-03 NOTE — Telephone Encounter (Signed)
OK, lets have her do that. It may be good ideas to test her anyway. thak you angela. Good thinking.

## 2017-09-03 NOTE — Telephone Encounter (Signed)
Patient could come in for a walk test and maybe she will qualify for o2.

## 2017-09-09 ENCOUNTER — Ambulatory Visit: Payer: Medicare Other | Admitting: Rehabilitative and Restorative Service Providers"

## 2017-09-09 DIAGNOSIS — R29898 Other symptoms and signs involving the musculoskeletal system: Secondary | ICD-10-CM

## 2017-09-09 DIAGNOSIS — M25512 Pain in left shoulder: Secondary | ICD-10-CM

## 2017-09-09 DIAGNOSIS — R531 Weakness: Secondary | ICD-10-CM

## 2017-09-09 DIAGNOSIS — G8929 Other chronic pain: Secondary | ICD-10-CM

## 2017-09-09 DIAGNOSIS — M542 Cervicalgia: Secondary | ICD-10-CM

## 2017-09-09 NOTE — Therapy (Signed)
[facility] [place] [address] [place], [place], [address] Phone: [phone]   Fax:  [phone]  Physical Therapy Treatment  Patient Details  Name: Joann Maddox MRN: [id number] Date of Birth: March 14, 1941 Referring Provider: Dr Linford Arnold    Encounter Date: 09/09/2017  PT End of Session - 09/09/17 1452    Visit Number  11    Number of Visits  18    Date for PT Re-Evaluation  10/08/17    PT Start Time  1451    PT Stop Time  1545    PT Time Calculation (min)  54 min    Activity Tolerance  Patient tolerated treatment well       Past Medical History:  Diagnosis Date  . Anxiety   . Headache(784.0)    migrains  . Heart murmur   . Hypothyroidism   . Rheumatoid arthritis(714.0)     Past Surgical History:  Procedure Laterality Date  . ABDOMINAL HYSTERECTOMY    . APPENDECTOMY    . CHOLECYSTECTOMY    . REPLACEMENT TOTAL KNEE BILATERAL    . TONSILLECTOMY     as  a child  . TOTAL HIP ARTHROPLASTY     right and left    There were no vitals filed for this visit.  Subjective Assessment - 09/09/17 1458    Subjective  Travelling over the last 2 weeks. Doing some exercises in the car and at home as she could. Will work more consistently now that she is at home.     Currently in Pain?  Yes    Pain Score  6     Pain Location  Neck    Pain Orientation  Left    Pain Descriptors / Indicators  Aching;Dull    Pain Type  Chronic pain    Pain Radiating Towards  into the Lt shoulder     Pain Score  6    Pain Location  Shoulder    Pain Orientation  Left    Pain Descriptors / Indicators  Aching;Dull;Tightness    Pain Type  Chronic pain         [facility] PT Assessment - 09/09/17 0001      Assessment   Medical Diagnosis  cervical dysfunction; Lt shoulder pain     Referring Provider  Dr Linford Arnold     Onset Date/Surgical Date  12/26/16 symptoms chronic in nature for several years     Hand Dominance  Right    Next MD Visit  8/19       AROM   Cervical Flexion  54    Cervical Extension  35    Cervical - Right Side Bend  32    Cervical - Left Side Bend  26    Cervical - Right Rotation  48    Cervical - Left Rotation  37      Palpation   Palpation comment  muscular tightness Lt > Rt upper quarter including upper traps; leveator; pecs; ant/lat/posterior cervical musculature; anterior deltoid; biceps                    [facility] Adult PT Treatment/Exercise - 09/09/17 0001      Neck Exercises: Seated   Neck Retraction  5 reps;10 secs    Cervical Rotation  Right;Left;5 reps    Lateral Flexion  Right;Left;5 reps    Shoulder Rolls  Backwards;10 reps      Shoulder Exercises: Seated   Other Seated Exercises  axial extension 15  sec hold x 5     Other Seated Exercises  shoulder rolls x 10 backwards       Shoulder Exercises: Standing   Other Standing Exercises  scap squeeze with noodle 10 sec x 10; scap retraction with shoulder abduction x 10/diagonals x 10; W's x 10       Shoulder Exercises: Pulleys   Flexion  2 minutes    ABduction  2 minutes      Moist Heat Therapy   Number Minutes Moist Heat  20 Minutes    Moist Heat Location  Cervical;Shoulder      Electrical Stimulation   Electrical Stimulation Location  Lt lateral cervical; Lt shoulder     Electrical Stimulation Action  IFC    Electrical Stimulation Parameters  to tolerance    Electrical Stimulation Goals  Pain;Tone      Manual Therapy   Manual therapy comments  pt sitting     Soft tissue mobilization  Lt lateral and posterior cervical and upper thoracic musculature; upper trap; leveator; posterior deltoid;        Trigger Point Dry Needling - 09/09/17 1516    Consent Given?  Yes    Muscles Treated Upper Body  -- Lt cervical - with estim     Upper Trapezius Response  Palpable increased muscle length    Levator Scapulae Response  Palpable increased muscle length    Longissimus Response  Palpable increased muscle length Lt lateral cervical               PT Short Term Goals - 08/27/17 1408      PT SHORT TERM GOAL #1   Title  Education re-sleeping position with patient verbalizing understanding of importance of head support when in recliner 06/29/17    Time  4    Period  Weeks    Status  Achieved      PT SHORT TERM GOAL #2   Title  Initiate UE ROM and strengthening exercises with patient to report at least 15 min of exercise each day 10/08/17    Time  18    Period  Weeks    Status  Revised        PT Long Term Goals - 09/09/17 1622      PT LONG TERM GOAL #1   Title  Increase AROM cervical spine by 5-10 degrees in all planes 10/08/17    Time  18    Period  Weeks    Status  On-going      PT LONG TERM GOAL #2   Title  Increase AROM Lt shoudler by 5-15 degrees in flexion and abduction 10/08/17    Time  18    Period  Weeks    Status  On-going      PT LONG TERM GOAL #3   Title  Increase strength Lt/Rt shoulders allowing patient to use UE's for more functional activities including combing hair, washing face, etc 10/08/17     Time  18    Period  Weeks    Status  On-going      PT LONG TERM GOAL #4   Title  patient reports performing some exercise for neck and shoulders at least once daily 10/08/17    Time  18    Period  Weeks    Status  On-going      PT LONG TERM GOAL #5   Title  Independent in HEP 10/08/17    Time  18    Period  Weeks    Status  On-going      PT LONG TERM GOAL #6   Title  Improve FOTO to </= 50% limitation 10/12/17    Time  18    Period  Weeks    Status  On-going            Plan - 09/09/17 1452    Clinical Impression Statement  Joann Maddox continues to report Lt neck and shoudler pain with variable intensity. She has limited cervical and bilat UE ROM and mbility. She has decreased strength Lt > Rt UE. She has difficulty sleeping and is still sleeping sitting in her chair. She has been travelling and has had less time for exercise. She is doing some exercises in car and at home. Good response to  DN and manual work. Chronic condition creates difficulty in progressing with functional goals.     Rehab Potential  Good    PT Frequency  2x / week    PT Duration  Other (comment) 18 weeks     PT Treatment/Interventions  Patient/family education;ADLs/Self Care Home Management;Cryotherapy;Electrical Stimulation;Iontophoresis 4mg /ml Dexamethasone;Moist Heat;Ultrasound;Dry needling;Manual techniques;Therapeutic activities;Therapeutic exercise    PT Next Visit Plan  postural exercises; gentle stretching; pulleys for UE ROM; posterior shoulder girdle strengthening; manual therapy; DN; modalities as indicated     Consulted and Agree with Plan of Care  Patient       Patient will benefit from skilled therapeutic intervention in order to improve the following deficits and impairments:  Postural dysfunction, Improper body mechanics, Pain, Increased fascial restricitons, Increased muscle spasms, Decreased range of motion, Decreased mobility, Decreased strength, Impaired UE functional use, Decreased activity tolerance  Visit Diagnosis: Cervicalgia  Chronic left shoulder pain  Other symptoms and signs involving the musculoskeletal system  Weakness generalized     Problem List Patient Active Problem List   Diagnosis Date Noted  . Chronic venous insufficiency 04/29/2017  . Cervical spondylosis 07/20/2016  . 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HCC) 02/12/2015  . Type 2 diabetes mellitus without complication (HCC) 08/10/2014  . Chronic constipation 02/19/2014  . Rosacea 11/06/2013  . GAD (generalized anxiety disorder) 06/08/2013  . Essential hypertension, benign 05/04/2013  . Rheumatoid arthritis (HCC) 05/04/2013  . Migraine with aura 05/04/2013  . Palpitations 05/04/2013  . Murmur, heart 05/04/2013  . Hyperlipidemia 05/04/2013  . Hypothyroidism 05/04/2013  . Fatigue 02/16/2012  . Obesity, morbid (HCC) 02/16/2012  . Urge  incontinence 02/16/2012  . Hyperglycemia 01/12/2012    Adithya Difrancesco Rober Minion PT, MPH  09/09/2017, 4:23 PM  [facility] [address] [place], [place], [address] Phone: [phone]   Fax:  [phone]  Name: Joann Maddox MRN: [id number] Date of Birth: 17-Sep-1940

## 2017-09-09 NOTE — Telephone Encounter (Signed)
Pt called to discuss O2 options. I informed her that she would need to come in to have a walk test done and that this could be done as a nurse visit. I transferred her to scheduling.Joann Maddox

## 2017-09-15 ENCOUNTER — Ambulatory Visit (INDEPENDENT_AMBULATORY_CARE_PROVIDER_SITE_OTHER): Payer: Medicare Other | Admitting: Osteopathic Medicine

## 2017-09-15 VITALS — BP 141/54 | HR 92

## 2017-09-15 DIAGNOSIS — G4734 Idiopathic sleep related nonobstructive alveolar hypoventilation: Secondary | ICD-10-CM

## 2017-09-15 DIAGNOSIS — I10 Essential (primary) hypertension: Secondary | ICD-10-CM | POA: Diagnosis not present

## 2017-09-15 MED ORDER — LISINOPRIL-HYDROCHLOROTHIAZIDE 10-12.5 MG PO TABS: 1.0000 | ORAL_TABLET | Freq: Every day | ORAL | 1 refills | Status: DC

## 2017-09-15 NOTE — Progress Notes (Signed)
Pt came into clinic today for walk test. Pt states PCP wanted this completed for possible overnight oxygen. Pt states she feels short of breath sometimes.   Resting O2 on room air: 98%. Walking O2: 96%, pulse 114 Resting O2 on 2L oxygen: 99% Walking O2 on 2L oxygen: 97%, pulse 107  Pt's diastolic pressure was low today. Consulted with Provider in office, BP Rx was decreased. Pt advised to follow up with PCP in 1 week. Verbalized understanding. No further questions.

## 2017-09-16 NOTE — Progress Notes (Signed)
Was asked to see patient briefly to discuss low BP. Pt diastolic has been low past several measurements, she does report occasional dizziness, no fall or LOC. I decreased Lisinopril-HCT by half dose, sent new Rx to pharmacy and advised f/u w/ PCP

## 2017-09-23 ENCOUNTER — Encounter: Payer: Medicare Other | Admitting: Rehabilitative and Restorative Service Providers"

## 2017-09-24 ENCOUNTER — Other Ambulatory Visit: Payer: Self-pay | Admitting: Family Medicine

## 2017-09-27 ENCOUNTER — Ambulatory Visit (INDEPENDENT_AMBULATORY_CARE_PROVIDER_SITE_OTHER): Payer: Medicare Other | Admitting: Family Medicine

## 2017-09-27 ENCOUNTER — Encounter: Payer: Self-pay | Admitting: Family Medicine

## 2017-09-27 VITALS — BP 132/45 | HR 84 | Ht 60.0 in | Wt 210.0 lb

## 2017-09-27 DIAGNOSIS — G4734 Idiopathic sleep related nonobstructive alveolar hypoventilation: Secondary | ICD-10-CM | POA: Diagnosis not present

## 2017-09-27 DIAGNOSIS — G4733 Obstructive sleep apnea (adult) (pediatric): Secondary | ICD-10-CM

## 2017-09-27 DIAGNOSIS — I10 Essential (primary) hypertension: Secondary | ICD-10-CM

## 2017-09-27 DIAGNOSIS — M5412 Radiculopathy, cervical region: Secondary | ICD-10-CM | POA: Diagnosis not present

## 2017-09-27 MED ORDER — LISINOPRIL 10 MG PO TABS: 10.0000 mg | ORAL_TABLET | Freq: Every day | ORAL | 1 refills | Status: DC

## 2017-09-27 MED ORDER — AMBULATORY NON FORMULARY MEDICATION: 0 refills | Status: DC

## 2017-09-27 NOTE — Progress Notes (Signed)
Subjective:    Patient ID: Joann Maddox, female    DOB: [birth year], 77 y.o.   MRN: [id number]  HPI Hypertension-  low diastolic pressure.  Pt denies chest pain, SOB, dizziness, or heart palpitations.  Taking meds as directed w/o problems.  Denies medication side effects.    Been trying to get overnight oxygen for her but had difficulty doing so so I had her come in to do a walk test to see if she was dropping with ambulation.  She actually had a normal walk test.  He actually has a diagnosis of sleep apnea but cannot tolerate CPAP so I want her to at least have overnight oxygen.  She continues to have neck pain that radiates down to her left arm.  She had cervical facet injections done about a year ago and continues to have pain.  She has discussed it with her rheumatologist Dr. Dierdre Forth.  He is recommended a referral to possible neurosurgeon but she does not want to have surgery.  She is currently undergoing PT and has started massage therapy again.  Review of Systems     BP (!) 132/45   Pulse 84   Ht 5' (1.524 m)   Wt 210 lb (95.3 kg)   SpO2 98%   BMI 41.01 kg/m     Allergies  Allergen Reactions  . Iodine Other (See Comments)    Shook violently and passed out.  . Codeine Other (See Comments)    Hallucinations  . Lipitor [Atorvastatin] Other (See Comments)    Myalgias    Past Medical History:  Diagnosis Date  . Anxiety   . Headache(784.0)    migrains  . Heart murmur   . Hypothyroidism   . Rheumatoid arthritis(714.0)     Past Surgical History:  Procedure Laterality Date  . ABDOMINAL HYSTERECTOMY    . APPENDECTOMY    . CHOLECYSTECTOMY    . REPLACEMENT TOTAL KNEE BILATERAL    . TONSILLECTOMY     as  a child  . TOTAL HIP ARTHROPLASTY     right and left    Social History   Socioeconomic History  . Marital status: Married    Spouse name: Dorene Sorrow  . Number of children: 2  . Years of education: Not on file  . Highest education level: Not on file  Occupational  History  . Occupation: Retired Runner, broadcasting/film/video    Comment: Corporate treasurer  Social Needs  . Financial resource strain: Not on file  . Food insecurity:    Worry: Not on file    Inability: Not on file  . Transportation needs:    Medical: Not on file    Non-medical: Not on file  Tobacco Use  . Smoking status: Never Smoker  . Smokeless tobacco: Never Used  Substance and Sexual Activity  . Alcohol use: No  . Drug use: Not on file  . Sexual activity: Not on file  Lifestyle  . Physical activity:    Days per week: Not on file    Minutes per session: Not on file  . Stress: Not on file  Relationships  . Social connections:    Talks on phone: Not on file    Gets together: Not on file    Attends religious service: Not on file    Active member of club or organization: Not on file    Attends meetings of clubs or organizations: Not on file    Relationship status: Not on file  . Intimate partner violence:  Fear of current or ex partner: Not on file    Emotionally abused: Not on file    Physically abused: Not on file    Forced sexual activity: Not on file  Other Topics Concern  . Not on file  Social History Narrative   No regular exercise. 2 caffeine drinks per day.     Family History  Problem Relation Age of Onset  . Heart disease Father 45  . Diabetes Unknown        aunt   . Stroke Mother 42    Outpatient Encounter Medications as of 09/27/2017  Medication Sig  . AZELEX 20 % cream APPLY TOPICALL 2 TIMES A DAY (MORNING AND EVENING). APPLY AFTER SKIN IS WASHED AND PATTED DRY.  . butalbital-acetaminophen-caffeine (FIORICET, ESGIC) 50-325-40 MG tablet TAKE 1 TABLET BY MOUTH TWICE A DAY AS NEEDED  . DULoxetine (CYMBALTA) 60 MG capsule Take 1 capsule (60 mg total) by mouth daily.  . folic acid (FOLVITE) 1 MG tablet TAKE 1 TABLET BY MOUTH EVERY DAY  . furosemide (LASIX) 20 MG tablet TAKE 1 TABLET BY MOUTH EVERY DAY AS NEEDED  . hydroxychloroquine (PLAQUENIL) 200 MG tablet 2 TABLET WITH  FOOD OR MILK ONCE A DAY ORALLY  . KLOR-CON 10 10 MEQ tablet TAKE 1 TABLET (10 MEQ TOTAL) BY MOUTH 2 (TWO) TIMES DAILY.  Marland Kitchen levothyroxine (SYNTHROID, LEVOTHROID) 25 MCG tablet Take 1 tablet (25 mcg total) by mouth daily.  Marland Kitchen lisinopril (PRINIVIL,ZESTRIL) 10 MG tablet Take 1 tablet (10 mg total) by mouth daily.  . meloxicam (MOBIC) 15 MG tablet TAKE ONE TABLET BY MOUTH EACH AM WITH BREAKFAST AS NEEDED FOR PAIN  . metFORMIN (GLUCOPHAGE) 500 MG tablet TAKE 1 TABLET BY MOUTH 2 TIMES DAILY WITH A MEAL.  . methotrexate 50 MG/2ML injection 1 ML WEEKLY INJECTION 30 DAYS  . Multiple Vitamin (MULTIVITAMIN) tablet Take 1 tablet by mouth daily.  Marland Kitchen MYRBETRIQ 25 MG TB24 tablet TAKE 1 TABLET BY MOUTH EVERY DAY  . nystatin (NYSTATIN) powder Apply 1 g topically 2 (two) times daily. X 3 weeks.  . rosuvastatin (CRESTOR) 10 MG tablet TAKE 1 TABLET (10 MG TOTAL) BY MOUTH DAILY.  Marland Kitchen senna (SENOKOT) 8.6 MG tablet Take 1 tablet by mouth as needed.   Marland Kitchen tiZANidine (ZANAFLEX) 4 MG tablet Take 0.5-1 tablets (2-4 mg total) by mouth every 8 (eight) hours as needed for muscle spasms.  . traMADol (ULTRAM) 50 MG tablet TAKE 1 TO 2 TABLETS BY MOUTH EVERY 12 HOURS AS NEEDED FOR PAIN  . vitamin B-12 (CYANOCOBALAMIN) 100 MCG tablet Take 50 mcg by mouth daily.  . Vitamin D, Ergocalciferol, (DRISDOL) 50000 units CAPS capsule TAKE 1 CAPSULE (50,000 UNITS TOTAL) BY MOUTH ONCE A WEEK.  . [DISCONTINUED] AMBULATORY NON FORMULARY MEDICATION Medication Name: Overnight oxygen set to 2 liters.  Dx: nocturnal Hypoxemia. Fax to [facility].  . [DISCONTINUED] AMBULATORY NON FORMULARY MEDICATION Medication Name: Overnight oxygen study.  . [DISCONTINUED] lisinopril-hydrochlorothiazide (PRINZIDE,ZESTORETIC) 10-12.5 MG tablet Take 1 tablet by mouth daily.   No facility-administered encounter medications on file as of 09/27/2017.       Objective:   Physical Exam  Constitutional: She is oriented to person, place, and time. She appears well-developed and  well-nourished.  HENT:  Head: Normocephalic and atraumatic.  Cardiovascular: Normal rate, regular rhythm and normal heart sounds.  Pulmonary/Chest: Effort normal and breath sounds normal.  Neurological: She is alert and oriented to person, place, and time.  Skin: Skin is warm and dry.  Psychiatric: She has  a normal mood and affect. Her behavior is normal.          Assessment & Plan:  HTN  -blood pressure overall looks okay today but her diastolic is still low.  Since she does take her Lasix almost daily we will discontinue the HCTZ component of her blood pressure medication and follow-up in 1 month.  Nocturnal hypoxemia-we will try to get a overnight pulse oximetry scheduled though this will be difficult since she does have a baseline diagnosis of sleep apnea.  Patient call back and let us know that she is willing to retry CPAP.  She tried it initially and had such difficult time that she just gave it back.  But she is willing to try it again.  Certainly I am hopeful that we can find a mask that fits well and may be even adjust her pressure to make it more comfortable.  Cervical pain with radiculopathy down the left arm-we will refer her to [facility] orthopedics, Dr. Annell Greening.

## 2017-09-27 NOTE — Patient Instructions (Addendum)
Finish your lisinopril hct and then when run out start the plain lisinopril.

## 2017-09-28 ENCOUNTER — Telehealth: Payer: Self-pay

## 2017-09-28 ENCOUNTER — Telehealth: Payer: Self-pay | Admitting: Family Medicine

## 2017-09-28 NOTE — Telephone Encounter (Signed)
See other note. Closing encounter.  

## 2017-09-28 NOTE — Telephone Encounter (Signed)
Spoke with Fayrene Fearing at [facility] and he stated that the patient does not qualify for the overnight oxygen per medicare guidelines. Called the patient and advised her that insurance was not going to pay for the overnight oxygen and she voices understanding. I advised her that there are different types of masks for CPAP per Tandy Gaw and Dr. Linford Arnold and she will discuss this with her husband and call back with a decision.

## 2017-09-28 NOTE — Telephone Encounter (Signed)
Opened in error

## 2017-09-28 NOTE — Addendum Note (Signed)
Addended by: Arvilla Market on: 09/28/2017 01:50 PM   Modules accepted: Orders

## 2017-09-28 NOTE — Telephone Encounter (Signed)
OOK then lets fax over new order and records for CPAP.

## 2017-09-28 NOTE — Telephone Encounter (Signed)
Okay, let us just refax paperwork in order for CPAP to [facility] and see if they would be willing to deliver a new machine and set her up.  Can start and try some different masks.

## 2017-09-28 NOTE — Telephone Encounter (Signed)
Patient is agreeable to try the CPAP machine again and see if there is a different mask that would better suit her to help her sleep better at night. Please advise.

## 2017-09-28 NOTE — Telephone Encounter (Signed)
Please print the order and sign.

## 2017-09-28 NOTE — Telephone Encounter (Signed)
Patient called and she does not have a machine. She would need a machine ordered. Please advise.

## 2017-09-29 ENCOUNTER — Ambulatory Visit: Payer: Medicare Other | Admitting: Rehabilitative and Restorative Service Providers"

## 2017-09-29 ENCOUNTER — Telehealth: Payer: Self-pay | Admitting: *Deleted

## 2017-09-29 ENCOUNTER — Encounter: Payer: Self-pay | Admitting: Rehabilitative and Restorative Service Providers"

## 2017-09-29 ENCOUNTER — Encounter: Payer: Self-pay | Admitting: Family Medicine

## 2017-09-29 DIAGNOSIS — R29898 Other symptoms and signs involving the musculoskeletal system: Secondary | ICD-10-CM

## 2017-09-29 DIAGNOSIS — R531 Weakness: Secondary | ICD-10-CM

## 2017-09-29 DIAGNOSIS — M542 Cervicalgia: Secondary | ICD-10-CM

## 2017-09-29 DIAGNOSIS — G8929 Other chronic pain: Secondary | ICD-10-CM | POA: Diagnosis not present

## 2017-09-29 DIAGNOSIS — M25512 Pain in left shoulder: Secondary | ICD-10-CM

## 2017-09-29 MED ORDER — AMBULATORY NON FORMULARY MEDICATION: 0 refills | Status: DC

## 2017-09-29 NOTE — Telephone Encounter (Signed)
Order for CPAP faxed to [facility].  Confirmation received.Laureen Ochs, Viann Shove, CMA

## 2017-09-29 NOTE — Therapy (Signed)
[facility] [place], [place], [address] Phone: [phone]   Fax:  [phone]  Physical Therapy Treatment  Patient Details  Name: Joann Maddox MRN: [id number] Date of Birth: 1940/05/15 Referring Provider: Dr Madilyn Fireman   Encounter Date: 09/29/2017  PT End of Session - 09/29/17 1439    Visit Number  12    Number of Visits  18    Date for PT Re-Evaluation  10/08/17    PT Start Time  6767    PT Stop Time  1522    PT Time Calculation (min)  57 min    Activity Tolerance  Patient tolerated treatment well       Past Medical History:  Diagnosis Date  . Anxiety   . Headache(784.0)    migrains  . Heart murmur   . Hypothyroidism   . Rheumatoid arthritis(714.0)     Past Surgical History:  Procedure Laterality Date  . ABDOMINAL HYSTERECTOMY    . APPENDECTOMY    . CHOLECYSTECTOMY    . REPLACEMENT TOTAL KNEE BILATERAL    . TONSILLECTOMY     as  a child  . TOTAL HIP ARTHROPLASTY     right and left    There were no vitals filed for this visit.  Subjective Assessment - 09/29/17 1443    Subjective  Now having a massage some weeks which she thinks helps with the tightness and pain in the neck and shoulder. She is doing some exercises at home. She is trying to do them more consistently.     Currently in Pain?  Yes    Pain Score  5     Pain Location  Neck    Pain Orientation  Left    Pain Descriptors / Indicators  Aching;Dull    Pain Type  Chronic pain    Pain Onset  More than a month ago    Pain Frequency  Constant    Pain Score  6    Pain Location  Shoulder    Pain Orientation  Left    Pain Descriptors / Indicators  Aching;Dull;Tightness    Pain Type  Chronic pain    Pain Onset  More than a month ago    Pain Frequency  Intermittent         [facility] PT Assessment - 09/29/17 0001      Assessment   Medical Diagnosis  cervical dysfunction; Lt shoulder pain     Referring Provider  Dr Madilyn Fireman    Onset  Date/Surgical Date  12/26/16 symptoms chronic in nature for several years     Hand Dominance  Right    Next MD Visit  8/19      Observation/Other Assessments   Focus on Therapeutic Outcomes (FOTO)   62% limitation       AROM   Cervical Flexion  60    Cervical Extension  34    Cervical - Right Side Bend  32    Cervical - Left Side Bend  28    Cervical - Right Rotation  50    Cervical - Left Rotation  41      Strength   Right Shoulder Flexion  4+/5    Right Shoulder Extension  4+/5    Right Shoulder ABduction  4/5    Right Shoulder Internal Rotation  4/5    Right Shoulder External Rotation  4/5    Left Shoulder Flexion  4/5    Left Shoulder Extension  -- 5-/5  Left Shoulder ABduction  4-/5    Left Shoulder Internal Rotation  4/5    Left Shoulder External Rotation  4-/5      Palpation   Palpation comment  muscular tightness Lt > Rt upper quarter including upper traps; leveator; pecs; ant/lat/posterior cervical musculature; anterior deltoid; biceps                    [facility] Adult PT Treatment/Exercise - 09/29/17 0001      Shoulder Exercises: Seated   Row  Strengthening;Both;20 reps;Theraband    Theraband Level (Shoulder Row)  Level 1 (Yellow)    Other Seated Exercises  axial extension 15 sec hold x 5     Other Seated Exercises  shoulder rolls x 10 backwards       Shoulder Exercises: Standing   Other Standing Exercises  scap squeeze with noodle 10 sec x 10; scap retraction with shoulder abduction x 10/diagonals x 10; W's x 10       Shoulder Exercises: Pulleys   Flexion  2 minutes    ABduction  2 minutes      Moist Heat Therapy   Number Minutes Moist Heat  20 Minutes    Moist Heat Location  Cervical;Shoulder      Electrical Stimulation   Electrical Stimulation Location  Lt lateral cervical; Lt shoulder     Electrical Stimulation Action  IFC    Electrical Stimulation Parameters  to tolerance    Electrical Stimulation Goals  Pain;Tone      Manual Therapy    Manual therapy comments  pt sitting     Soft tissue mobilization  Lt lateral and posterior cervical and upper thoracic musculature; upper trap; leveator; posterior deltoid;        Trigger Point Dry Needling - 09/29/17 1502    Consent Given?  Yes    Muscles Treated Upper Body  -- Lt with estim     Levator Scapulae Response  Palpable increased muscle length    Longissimus Response  Palpable increased muscle length Lt posterior laterral cervical              PT Short Term Goals - 09/29/17 1446      PT SHORT TERM GOAL #1   Title  Education re-sleeping position with patient verbalizing understanding of importance of head support when in recliner 06/29/17    Time  4    Period  Weeks    Status  Achieved      PT SHORT TERM GOAL #2   Title  Initiate UE ROM and strengthening exercises with patient to report at least 15 min of exercise each day 10/08/17    Time  18    Period  Weeks    Status  On-going        PT Long Term Goals - 09/29/17 1445      PT LONG TERM GOAL #1   Title  Increase AROM cervical spine by 5-10 degrees in all planes 10/08/17    Time  18    Period  Weeks    Status  Partially Met      PT LONG TERM GOAL #2   Title  Increase AROM Lt shoudler by 5-15 degrees in flexion and abduction 10/08/17    Time  18    Period  Weeks    Status  On-going      PT LONG TERM GOAL #3   Title  Increase strength Lt/Rt shoulders allowing patient to use UE's for more functional  activities including combing hair, washing face, etc 10/08/17     Time  18    Period  Weeks    Status  On-going      PT LONG TERM GOAL #4   Title  patient reports performing some exercise for neck and shoulders at least once daily 10/08/17    Time  18    Period  Weeks    Status  On-going      PT LONG TERM GOAL #5   Title  Independent in HEP 10/08/17    Time  18    Period  Weeks    Status  On-going      PT LONG TERM GOAL #6   Title  Improve FOTO to </= 50% limitation 10/12/17    Time  18    Period   Weeks    Status  On-going            Plan - 09/29/17 1504    Clinical Impression Statement  Patient demonstrates some increase in cervical ROM/mobility. She has decreased palpable tightness through the cervical and upper trap musculature. Patient is now working on exercise at [facility] more consistently. She is gradually progressing toward stated goals of therapy.     Rehab Potential  Good    PT Duration  Other (comment) 18 weeks     PT Treatment/Interventions  Patient/family education;ADLs/Self Care Home Management;Cryotherapy;Electrical Stimulation;Iontophoresis 68m/ml Dexamethasone;Moist Heat;Ultrasound;Dry needling;Manual techniques;Therapeutic activities;Therapeutic exercise    PT Next Visit Plan  postural exercises; gentle stretching; pulleys for UE ROM; posterior shoulder girdle strengthening; manual therapy; DN; modalities as indicated     Consulted and Agree with Plan of Care  Patient       Patient will benefit from skilled therapeutic intervention in order to improve the following deficits and impairments:  Postural dysfunction, Improper body mechanics, Pain, Increased fascial restricitons, Increased muscle spasms, Decreased range of motion, Decreased mobility, Decreased strength, Impaired UE functional use, Decreased activity tolerance  Visit Diagnosis: Cervicalgia  Chronic left shoulder pain  Other symptoms and signs involving the musculoskeletal system  Weakness generalized     Problem List Patient Active Problem List   Diagnosis Date Noted  . Chronic venous insufficiency 04/29/2017  . Cervical spondylosis 07/20/2016  . 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[place] 02/12/2015  . Type 2 diabetes mellitus without complication ([place] 032/95/1884 . Chronic constipation 02/19/2014  . Rosacea 11/06/2013  . GAD (generalized anxiety disorder) 06/08/2013  . Essential hypertension, benign 05/04/2013  . Rheumatoid  arthritis ([place] 05/04/2013  . Migraine with aura 05/04/2013  . Palpitations 05/04/2013  . Murmur, heart 05/04/2013  . Hyperlipidemia 05/04/2013  . Hypothyroidism 05/04/2013  . Fatigue 02/16/2012  . Obesity, morbid ([place] 02/16/2012  . Urge incontinence 02/16/2012  . Hyperglycemia 01/12/2012    Alyene Predmore PNilda SimmerPT, MPH  09/29/2017, 3:15 PM  [facility] [address]Phone: [phone]  Fax:  [phone] Name: Joann BOBSTMRN: [id number] of Birth: 307-10-1940

## 2017-09-29 NOTE — Addendum Note (Signed)
Addended by: Arvilla Market on: 09/29/2017 08:07 AM   Modules accepted: Orders

## 2017-09-30 NOTE — Telephone Encounter (Signed)
No additional encounter note found. W[facility], CCMA

## 2017-10-08 NOTE — Telephone Encounter (Signed)
Per [facility] patient will need an updated sleep study that shows her AHI is greater than 5 to get approved for a CPAP. Please advise next steps.

## 2017-10-11 NOTE — Telephone Encounter (Signed)
I am not going to repeat her sleep study, since just had one.  Will work on weight loss instead. Please call pt to let her know.

## 2017-10-11 NOTE — Telephone Encounter (Signed)
Left detailed message for patient with PCP recommendations. Patient was asked to call back with any questions.

## 2017-10-13 ENCOUNTER — Encounter: Payer: Medicare Other | Admitting: Rehabilitative and Restorative Service Providers"

## 2017-10-13 ENCOUNTER — Other Ambulatory Visit: Payer: Self-pay | Admitting: Osteopathic Medicine

## 2017-10-18 ENCOUNTER — Other Ambulatory Visit: Payer: Self-pay | Admitting: Family Medicine

## 2017-10-20 ENCOUNTER — Ambulatory Visit (INDEPENDENT_AMBULATORY_CARE_PROVIDER_SITE_OTHER): Payer: Self-pay

## 2017-10-20 ENCOUNTER — Encounter (INDEPENDENT_AMBULATORY_CARE_PROVIDER_SITE_OTHER): Payer: Self-pay | Admitting: Orthopaedic Surgery

## 2017-10-20 ENCOUNTER — Ambulatory Visit (INDEPENDENT_AMBULATORY_CARE_PROVIDER_SITE_OTHER): Payer: Medicare Other | Admitting: Orthopaedic Surgery

## 2017-10-20 VITALS — BP 129/59 | HR 62 | Ht 60.0 in | Wt 207.0 lb

## 2017-10-20 DIAGNOSIS — G8929 Other chronic pain: Secondary | ICD-10-CM | POA: Insufficient documentation

## 2017-10-20 DIAGNOSIS — M25512 Pain in left shoulder: Secondary | ICD-10-CM | POA: Diagnosis not present

## 2017-10-20 DIAGNOSIS — M542 Cervicalgia: Secondary | ICD-10-CM | POA: Insufficient documentation

## 2017-10-20 DIAGNOSIS — M069 Rheumatoid arthritis, unspecified: Secondary | ICD-10-CM | POA: Diagnosis not present

## 2017-10-20 MED ORDER — BUPIVACAINE HCL 0.25 % IJ SOLN
4.0000 mL | INTRAMUSCULAR | Status: AC | PRN
  Administered 2017-10-20: 4 mL via INTRA_ARTICULAR

## 2017-10-20 MED ORDER — LIDOCAINE HCL 1 % IJ SOLN
0.5000 mL | INTRAMUSCULAR | Status: AC | PRN
  Administered 2017-10-20: .5 mL

## 2017-10-20 MED ORDER — METHYLPREDNISOLONE ACETATE 40 MG/ML IJ SUSP
40.0000 mg | INTRAMUSCULAR | Status: AC | PRN
  Administered 2017-10-20: 40 mg via INTRA_ARTICULAR

## 2017-10-20 NOTE — Progress Notes (Signed)
Office Visit Note   Patient: Joann Maddox           Date of Birth: Jun 21, 1940           MRN: [id number] Visit Date: 10/20/2017              Requested by: Agapito Games, MD [address] [place], [place] [address] PCP: Agapito Games, MD   Assessment & Plan: Visit Diagnoses:  1. Neck pain   2. Rheumatoid arthritis involving left shoulder, unspecified rheumatoid factor presence (HCC)     Plan: I injected her left shoulder from posterior approach today with improvement in her pain symptoms we will see how she does with this and recheck her in about 4 weeks.  She does not get improvement in her pain we can repeat her cervical MRI scan.  A significant portion of her pain is probably coming from her rheumatoid arthritis with bone-on-bone changes in her shoulder on the left.  She may require a new MRI scan cervical spine and may need surgery on both her neck as well as total shoulder arthroplasty on the left to get relief of her current pain problem.  Thank you the opportunity to share in her care  Follow-Up Instructions: Return in about 1 month (around 11/17/2017).   Orders:  Orders Placed This Encounter  Procedures  . Large Joint Inj: L glenohumeral  . XR Cervical Spine 2 or 3 views   No orders of the defined types were placed in this encounter.     Procedures: Large Joint Inj: L glenohumeral on 10/20/2017 11:36 AM Indications: pain Details: 22 G 1.5 in needle  Arthrogram: No  Medications: 4 mL bupivacaine 0.25 %; 40 mg methylPREDNISolone acetate 40 MG/ML; 0.5 mL lidocaine 1 % Outcome: tolerated well, no immediate complications Procedure, treatment alternatives, risks and benefits explained, specific risks discussed. Consent was given by the patient. Immediately prior to procedure a time out was called to verify the correct patient, procedure, equipment, support staff and site/side marked as required. Patient was prepped and draped in the usual  sterile fashion.       Clinical Data: No additional findings.   Subjective: Chief Complaint  Patient presents with  . Neck - Pain    HPI 77 year old female with rheumatoid arthritis with neck pain with previous epidural injection last year with minimal relief.  She was referred by Dr. Dierdre Forth for ongoing pain in her neck primarily left greater than right shoulder.  She has some pain with rotation of her neck she has minimal usage of her left arm and can only flex at 30 degrees.  She is on methotrexate injections weekly uses tramadol for pain.  She has rheumatoid arthritis changes in her fingers and sometimes has some numbness in her hands but not consistent.  X-rays left shoulder March 2018 showed severe osteoarthritis 1 to 2 cm in and superior osteophytes milder acromioclavicular changes negative for acute fracture.  Previous chest x-ray 1 to 2 years prior showed smaller minimal inferior osteophytes of the shoulder.  Cervical MRI May 2017 showed prominent spondylitic changes primarily on the left at C5-6 and C6-7.  Review of Systems positive for previous knee and hip arthroplasties.  Previous facet injections cervical, 12/03/2016.  Hyperlipidemia hypothyroidism, anxiety, painful left shoulder.  Obesity hyperglycemia and urge incontinence.  14 point review of systems otherwise negative as it pertains to HPI.   Objective: Vital Signs: BP (!) 129/59   Pulse 62  Ht 5' (1.524 m)   Wt 207 lb (93.9 kg)   BMI 40.43 kg/m   Physical Exam  Constitutional: She is oriented to person, place, and time. She appears well-developed.  HENT:  Head: Normocephalic.  Right Ear: External ear normal.  Left Ear: External ear normal.  Eyes: Pupils are equal, round, and reactive to light.  Neck: No tracheal deviation present. No thyromegaly present.  Cardiovascular: Normal rate.  Pulmonary/Chest: Effort normal.  Abdominal: Soft.  Neurological: She is alert and oriented to person, place, and time.  Skin:  Skin is warm and dry.  Psychiatric: She has a normal mood and affect. Her behavior is normal.    Ortho Exam patient has some brachial plexus tenderness worse on the left than right moderate Spurling on the left mild on the right.  Upper extremity reflexes are 1+ and symmetrical.  She has some enlargement of PIP joints with minimal ulnar drift.  Flexion left shoulder 30 degrees severe pain with internal and external rotation.  With her elbow at her side internal and external rotation of the shoulder takes resistive testing.  She has crepitus with shoulder range of motion.  She has some pain with right shoulder range of motion but moves it significantly better than the left.  Specialty Comments:  No specialty comments available.  Imaging: Xr Cervical Spine 2 Or 3 Views  Result Date: 10/20/2017 AP lateral C-spine x-rays obtained and reviewed.  This shows disc space narrowing and spurring at C5-6 C6-7.  Facet joint degenerative changes noted on AP x-ray.  No spondylolisthesis. Impression: Mid cervical disc degeneration.  Facet degenerative changes.  Negative for acute fracture.    PMFS History: Patient Active Problem List   Diagnosis Date Noted  . Neck pain 10/20/2017  . Chronic venous insufficiency 04/29/2017  . Cervical spondylosis 07/20/2016  . 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HCC) 02/12/2015  . Type 2 diabetes mellitus without complication (HCC) 08/10/2014  . Chronic constipation 02/19/2014  . Rosacea 11/06/2013  . GAD (generalized anxiety disorder) 06/08/2013  . Essential hypertension, benign 05/04/2013  . Rheumatoid arthritis (HCC) 05/04/2013  . Migraine with aura 05/04/2013  . Palpitations 05/04/2013  . Murmur, heart 05/04/2013  . Hyperlipidemia 05/04/2013  . Hypothyroidism 05/04/2013  . Fatigue 02/16/2012  . Obesity, morbid (HCC) 02/16/2012  . Urge incontinence 02/16/2012  . Hyperglycemia 01/12/2012   Past  Medical History:  Diagnosis Date  . Anxiety   . Headache(784.0)    migrains  . Heart murmur   . Hypothyroidism   . Rheumatoid arthritis(714.0)     Family History  Problem Relation Age of Onset  . Heart disease Father 32  . Diabetes Unknown        aunt   . Stroke Mother 24    Past Surgical History:  Procedure Laterality Date  . ABDOMINAL HYSTERECTOMY    . APPENDECTOMY    . CHOLECYSTECTOMY    . REPLACEMENT TOTAL KNEE BILATERAL    . TONSILLECTOMY     as  a child  . TOTAL HIP ARTHROPLASTY     right and left   Social History   Occupational History  . Occupation: Retired Runner, broadcasting/film/video    Comment: Corporate treasurer  Tobacco Use  . Smoking status: Never Smoker  . Smokeless tobacco: Never Used  Substance and Sexual Activity  . Alcohol use: No  . Drug use: Not on file  . Sexual activity: Not on file

## 2017-10-21 ENCOUNTER — Encounter: Payer: Medicare Other | Admitting: Rehabilitative and Restorative Service Providers"

## 2017-10-22 ENCOUNTER — Other Ambulatory Visit: Payer: Self-pay | Admitting: Family Medicine

## 2017-10-25 ENCOUNTER — Ambulatory Visit: Payer: Medicare Other

## 2017-10-26 ENCOUNTER — Other Ambulatory Visit: Payer: Self-pay | Admitting: Family Medicine

## 2017-10-29 ENCOUNTER — Encounter: Payer: Medicare Other | Admitting: Rehabilitative and Restorative Service Providers"

## 2017-10-29 ENCOUNTER — Other Ambulatory Visit: Payer: Self-pay | Admitting: Family Medicine

## 2017-11-01 ENCOUNTER — Other Ambulatory Visit: Payer: Self-pay | Admitting: Sports Medicine

## 2017-11-01 ENCOUNTER — Ambulatory Visit: Payer: Medicare Other | Admitting: Rehabilitative and Restorative Service Providers"

## 2017-11-01 ENCOUNTER — Encounter: Payer: Self-pay | Admitting: Rehabilitative and Restorative Service Providers"

## 2017-11-01 DIAGNOSIS — R29898 Other symptoms and signs involving the musculoskeletal system: Secondary | ICD-10-CM | POA: Diagnosis not present

## 2017-11-01 DIAGNOSIS — R531 Weakness: Secondary | ICD-10-CM

## 2017-11-01 DIAGNOSIS — M25512 Pain in left shoulder: Secondary | ICD-10-CM | POA: Diagnosis not present

## 2017-11-01 DIAGNOSIS — G8929 Other chronic pain: Secondary | ICD-10-CM

## 2017-11-01 DIAGNOSIS — M542 Cervicalgia: Secondary | ICD-10-CM | POA: Diagnosis not present

## 2017-11-01 NOTE — Therapy (Signed)
[facility] [place] [place], [place], [address] Phone: [phone]   Fax:  [phone]  Physical Therapy Treatment  Patient Details  Name: Joann Maddox MRN: [id number] Date of Birth: [birth year] Referring Provider: Dr Suzi Roots    Encounter Date: 11/01/2017  PT End of Session - 11/01/17 1408    Visit Number  13    Number of Visits  18    Date for PT Re-Evaluation  12/13/17    PT Start Time  7793    PT Stop Time  1502    PT Time Calculation (min)  57 min    Activity Tolerance  Patient tolerated treatment well       Past Medical History:  Diagnosis Date  . Anxiety   . Headache(784.0)    migrains  . Heart murmur   . Hypothyroidism   . Rheumatoid arthritis(714.0)     Past Surgical History:  Procedure Laterality Date  . ABDOMINAL HYSTERECTOMY    . APPENDECTOMY    . CHOLECYSTECTOMY    . REPLACEMENT TOTAL KNEE BILATERAL    . TONSILLECTOMY     as  a child  . TOTAL HIP ARTHROPLASTY     right and left    There were no vitals filed for this visit.  Subjective Assessment - 11/01/17 1410    Subjective  Patient reports that she saw Dr Lorin Mercy 10/20/17 and received an injection in the Lt shoulder.  She received an injection in her neck &/24/19. She continues to have pain in the Lt shoulder and limited function with the shoulder.     Currently in Pain?  Yes    Pain Score  6     Pain Location  Neck    Pain Orientation  Left    Pain Descriptors / Indicators  Aching;Dull    Pain Type  Chronic pain    Pain Onset  More than a month ago    Pain Frequency  Constant    Pain Score  6    Pain Location  Shoulder    Pain Orientation  Left    Pain Descriptors / Indicators  Aching;Dull;Tightness    Pain Type  Chronic pain    Pain Onset  More than a month ago    Pain Frequency  Intermittent         [facility] PT Assessment - 11/01/17 0001      Assessment   Medical Diagnosis  cervical dysfunction; Lt shoulder pain      Referring Provider  Dr Suzi Roots     Onset Date/Surgical Date  12/26/16 symptoms chronic in nature for several years     Hand Dominance  Right    Next MD Visit  8/19    Prior Therapy  yes       Observation/Other Assessments   Focus on Therapeutic Outcomes (FOTO)   62% limitation       AROM   Left Shoulder Extension  47 Degrees pain     Left Shoulder Flexion  68 Degrees pain    Left Shoulder ABduction  60 Degrees pain     Left Shoulder Internal Rotation  44 Degrees    Left Shoulder External Rotation  22 Degrees    Cervical Flexion  64    Cervical Extension  34    Cervical - Right Side Bend  28    Cervical - Left Side Bend  31    Cervical - Right Rotation  50    Cervical - Left  Rotation  37      Strength   Right Shoulder Flexion  4+/5    Right Shoulder Extension  4+/5    Right Shoulder ABduction  4/5    Right Shoulder Internal Rotation  4/5    Right Shoulder External Rotation  4/5    Left Shoulder Flexion  4/5    Left Shoulder Extension  -- 5-/5    Left Shoulder ABduction  4-/5    Left Shoulder Internal Rotation  4/5    Left Shoulder External Rotation  4-/5      Palpation   Palpation comment  muscular tightness Lt > Rt upper quarter including upper traps; leveator; pecs; ant/lat/posterior cervical musculature; anterior deltoid; biceps                    [facility] Adult PT Treatment/Exercise - 11/01/17 0001      Shoulder Exercises: Seated   Retraction  Strengthening;Both;10 reps;Theraband    Theraband Level (Shoulder Retraction)  Level 1 (Yellow)    Row  Strengthening;Both;20 reps;Theraband    Theraband Level (Shoulder Row)  Level 1 (Yellow)    Other Seated Exercises  axial extension 15 sec hold x 5     Other Seated Exercises  shoulder rolls x 10 backwards       Shoulder Exercises: Pulleys   Flexion  2 minutes    ABduction  2 minutes      Moist Heat Therapy   Number Minutes Moist Heat  20 Minutes    Moist Heat Location  Cervical;Shoulder      Electrical  Stimulation   Electrical Stimulation Location  Lt lateral cervical; Lt shoulder     Electrical Stimulation Action  IFC    Electrical Stimulation Parameters  to tolerance    Electrical Stimulation Goals  Pain;Tone      Manual Therapy   Manual therapy comments  pt sitting     Soft tissue mobilization  Lt lateral and posterior cervical and upper thoracic musculature; upper trap; leveator; posterior deltoid;       Neck Exercises: Stretches   Neck Stretch  -- lat cerv flexion;cerv rotation 10 sec x 2 each direction     Other Neck Stretches  axial extension 2 sec x 10; posterior shoulder rolls x 10; cervical rotation x 5 each direction  - all exercises in sitting        Trigger Point Dry Needling - 11/01/17 1507    Consent Given?  Yes    Muscles Treated Upper Body  -- Lt with estim     Upper Trapezius Response  Palpable increased muscle length    Oblique Capitus Response  Palpable increased muscle length    Levator Scapulae Response  Palpable increased muscle length    Longissimus Response  Palpable increased muscle length           PT Education - 11/01/17 1511    Education provided  Yes    Education Details  reinforced and reviewed HEP - including pulley and theraband    Person(s) Educated  Patient    Methods  Explanation    Comprehension  Verbalized understanding       PT Short Term Goals - 11/01/17 1511      PT SHORT TERM GOAL #1   Title  Education re-sleeping position with patient verbalizing understanding of importance of head support when in recliner 06/29/17    Time  4    Period  Weeks    Status  Achieved  PT SHORT TERM GOAL #2   Title  Initiate UE ROM and strengthening exercises with patient to report at least 15 min of exercise each day 10/08/17    Time  18    Period  Weeks    Status  On-going        PT Long Term Goals - 11/01/17 1512      PT LONG TERM GOAL #1   Title  Increase AROM cervical spine by 5-10 degrees in all planes 10/08/17    Time  18     Period  Weeks    Status  Partially Met      PT LONG TERM GOAL #2   Title  Increase AROM Lt shoudler by 5-15 degrees in flexion and abduction 10/08/17    Time  18    Period  Weeks    Status  Partially Met      PT LONG TERM GOAL #3   Title  Increase strength Lt/Rt shoulders allowing patient to use UE's for more functional activities including combing hair, washing face, etc 10/08/17     Time  18    Period  Weeks    Status  On-going      PT LONG TERM GOAL #4   Title  patient reports performing some exercise for neck and shoulders at least once daily 10/08/17    Time  18    Period  Weeks    Status  Partially Met      PT LONG TERM GOAL #5   Title  Independent in HEP 10/08/17    Time  18    Period  Weeks    Status  Partially Met      PT LONG TERM GOAL #6   Title  Improve FOTO to </= 50% limitation 10/12/17    Time  18    Period  Weeks    Status  On-going            Plan - 11/01/17 1507    Clinical Impression Statement  Yevonne reports some improvement in Lt shoulder pain and movement. She will return to MD in ~ 1 month and will discuss the possibility of a TSA. Patient demonstrates slight improvement in cervical mobilty and ROM. She has persistent pain and limitations. Smith responds well to DN and manual work with decreased palpable tightness and improved moblity. Patient wil be discharged pending decision on TSA. We will continue treatment as directed.     Rehab Potential  Good    PT Frequency  2x / week    PT Duration  Other (comment) 18 weeks     PT Treatment/Interventions  Patient/family education;ADLs/Self Care Home Management;Cryotherapy;Electrical Stimulation;Iontophoresis 52m/ml Dexamethasone;Moist Heat;Ultrasound;Dry needling;Manual techniques;Therapeutic activities;Therapeutic exercise    PT Next Visit Plan  D/C PT - patient encouraged to continue with independent HEP. She has a pullley and theraband for home use.     Consulted and Agree with Plan of Care  Patient        Patient will benefit from skilled therapeutic intervention in order to improve the following deficits and impairments:  Postural dysfunction, Improper body mechanics, Pain, Increased fascial restricitons, Increased muscle spasms, Decreased range of motion, Decreased mobility, Decreased strength, Impaired UE functional use, Decreased activity tolerance  Visit Diagnosis: Cervicalgia  Chronic left shoulder pain  Other symptoms and signs involving the musculoskeletal system  Weakness generalized     Problem List Patient Active Problem List   Diagnosis Date Noted  . Neck pain 10/20/2017  .  Chronic venous insufficiency 04/29/2017  . Cervical spondylosis 07/20/2016  . 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[place]) 02/12/2015  . Type 2 diabetes mellitus without complication ([place]) 49/44/7395  . Chronic constipation 02/19/2014  . Rosacea 11/06/2013  . GAD (generalized anxiety disorder) 06/08/2013  . Essential hypertension, benign 05/04/2013  . Rheumatoid arthritis ([place]) 05/04/2013  . Migraine with aura 05/04/2013  . Palpitations 05/04/2013  . Murmur, heart 05/04/2013  . Hyperlipidemia 05/04/2013  . Hypothyroidism 05/04/2013  . Fatigue 02/16/2012  . Obesity, morbid ([place]) 02/16/2012  . Urge incontinence 02/16/2012  . Hyperglycemia 01/12/2012    Carrie Usery Nilda Simmer PT, MPH  11/01/2017, 3:20 PM  [facility] [place], [place], [address] Phone: [phone]   Fax:  [phone]  Name: ALEXANDERIA GORBY MRN: [id number] Date of Birth: 1941-01-12  PHYSICAL THERAPY DISCHARGE SUMMARY  Visits from Start of Care: 13  Current functional level related to goals / functional outcomes: See progress note for discharge status   Remaining deficits: Continued pain and limitations    Education / Equipment: HEP Plan: Patient agrees to discharge.  Patient goals were  partially met. Patient is being discharged due to                                                     ?????     Awaiting decision on TSA following MD visit.  Ocia Simek P. Helene Kelp PT, MPH 11/01/17 3:22 PM

## 2017-11-08 ENCOUNTER — Other Ambulatory Visit: Payer: Self-pay | Admitting: Family Medicine

## 2017-11-08 NOTE — Telephone Encounter (Signed)
[facility] requesting RF on Tramadol    Last RX sent 06-07-17 fpr #120 with 1 RF   RX pended, please send if appropriate  Thanks!

## 2017-11-17 ENCOUNTER — Encounter (INDEPENDENT_AMBULATORY_CARE_PROVIDER_SITE_OTHER): Payer: Self-pay | Admitting: Orthopaedic Surgery

## 2017-11-17 ENCOUNTER — Ambulatory Visit (INDEPENDENT_AMBULATORY_CARE_PROVIDER_SITE_OTHER): Payer: Medicare Other | Admitting: Orthopaedic Surgery

## 2017-11-17 VITALS — BP 122/70 | HR 68 | Ht 60.0 in | Wt 207.0 lb

## 2017-11-17 DIAGNOSIS — M069 Rheumatoid arthritis, unspecified: Secondary | ICD-10-CM

## 2017-11-17 DIAGNOSIS — M542 Cervicalgia: Secondary | ICD-10-CM

## 2017-11-17 DIAGNOSIS — M4722 Other spondylosis with radiculopathy, cervical region: Secondary | ICD-10-CM | POA: Diagnosis not present

## 2017-11-17 NOTE — Progress Notes (Signed)
Office Visit Note   Patient: Joann Maddox           Date of Birth: 1941/04/07           MRN: [id number] Visit Date: 11/17/2017              Requested by: Agapito Games, MD [address] [place], [place] [address] PCP: Agapito Games, MD   Assessment & Plan: Visit Diagnoses:  1. Neck pain   2. Rheumatoid arthritis involving multiple sites, unspecified rheumatoid factor presence (HCC)   3. Other spondylosis with radiculopathy, cervical region     Plan: We will proceed with cervical MRI scan for evaluation of cervical spondylosis and left foraminal stenosis likely C5-6 C6-7 consistent with her plain radiographs.  Office follow-up after MRI scan.  Follow-Up Instructions: After cervical MRI scan  Orders:  Orders Placed This Encounter  Procedures  . MR Cervical Spine w/o contrast   No orders of the defined types were placed in this encounter.     Procedures: No procedures performed   Clinical Data: No additional findings.   Subjective: Chief Complaint  Patient presents with  . Neck - Pain, Follow-up  . Left Shoulder - Pain, Follow-up    HPI 77 year old female with rheumatoid arthritis returns post left shoulder injection states she was better for possibly 1 day but really the injection did not help her pain.  She is having significant more neck pain particularly with rotation.  She is had a history of cervical epidurals multiple facet injections in the past with usually 3 facets injected at a time done elsewhere which gave her some improvement.  She is noticed progressive arm weakness worse on the left than right.  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  She is had great difficulty rotating her neck to the left and states that this is the most  severe pain that she has.  Shoulder x-rays show bone-on-bone changes as well as milder acromioclavicular changes.  Previous cervical spine x-ray showed no C1-C2 instability but significant spurring and narrowing with facet arthropathy and foraminal narrowing at C5-6 and C6-7 worse on the left than right.  Denies fever chills no bowel bladder symptoms.  No myelopathic symptoms.  Review of Systems 14 point review of systems updated unchanged from 10/20/2017 office visit other than as mentioned in HPI.  Previous bilateral knee and bilateral hip arthroplasties.  Positive for obesity hyperlipidemia hypothyroidism and urge incontinence.   Objective: Vital Signs: BP 122/70   Pulse 68   Ht 5' (1.524 m)   Wt 207 lb (93.9 kg)   BMI 40.43 kg/m   Physical Exam  Constitutional: She is oriented to person, place, and time. She appears well-developed.  HENT:  Head: Normocephalic.  Right Ear: External ear normal.  Left Ear: External ear normal.  Eyes: Pupils are equal, round, and reactive to light.  Neck: No tracheal deviation present. No thyromegaly present.  Cardiovascular: Normal rate.  Pulmonary/Chest: Effort normal.  Abdominal: Soft.  Neurological: She is alert and oriented to person, place, and time.  Skin: Skin is warm and dry.  Psychiatric: She has a normal mood and affect. Her behavior is normal.    Ortho Exam reproduction of her left shoulder pain with cervical compression some relief with distraction.  Upper extremity reflexes are 1+ and symmetrical.  Significant enlargement of PIP joints minimal ulnar drift.  Left shoulder flexion 30 degrees with pain pain with internal/external rotation 50% of normal.  Elbows reach full extension.  She has crepitus with shoulder range of motion.  Biceps triceps wrist flexion-extension is symmetrical right and left.  Specialty Comments:  No specialty comments available.  Imaging: No results found.   PMFS History: Patient Active Problem List    Diagnosis Date Noted  . Neck pain 10/20/2017  . Chronic venous insufficiency 04/29/2017  . Cervical spondylosis 07/20/2016  . 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HCC) 02/12/2015  . Type 2 diabetes mellitus without complication (HCC) 08/10/2014  . Chronic constipation 02/19/2014  . Rosacea 11/06/2013  . GAD (generalized anxiety disorder) 06/08/2013  . Essential hypertension, benign 05/04/2013  . Rheumatoid arthritis (HCC) 05/04/2013  . Migraine with aura 05/04/2013  . Palpitations 05/04/2013  . Murmur, heart 05/04/2013  . Hyperlipidemia 05/04/2013  . Hypothyroidism 05/04/2013  . Fatigue 02/16/2012  . Obesity, morbid (HCC) 02/16/2012  . Urge incontinence 02/16/2012  . Hyperglycemia 01/12/2012   Past Medical History:  Diagnosis Date  . Anxiety   . Headache(784.0)    migrains  . Heart murmur   . Hypothyroidism   . Rheumatoid arthritis(714.0)     Family History  Problem Relation Age of Onset  . Heart disease Father 80  . Diabetes Unknown        aunt   . Stroke Mother 35    Past Surgical History:  Procedure Laterality Date  . ABDOMINAL HYSTERECTOMY    . APPENDECTOMY    . CHOLECYSTECTOMY    . REPLACEMENT TOTAL KNEE BILATERAL    . TONSILLECTOMY     as  a child  . TOTAL HIP ARTHROPLASTY     right and left   Social History   Occupational History  . Occupation: Retired Runner, broadcasting/film/video    Comment: Corporate treasurer  Tobacco Use  . Smoking status: Never Smoker  . Smokeless tobacco: Never Used  Substance and Sexual Activity  . Alcohol use: No  . Drug use: Not on file  . Sexual activity: Not on file

## 2017-11-25 ENCOUNTER — Ambulatory Visit
Admission: RE | Admit: 2017-11-25 | Discharge: 2017-11-25 | Disposition: A | Discharge status: Home / Self Care | Payer: Medicare Other | Source: Ambulatory Visit | Attending: Orthopaedic Surgery | Admitting: Orthopaedic Surgery

## 2017-11-25 DIAGNOSIS — M542 Cervicalgia: Secondary | ICD-10-CM

## 2017-11-29 ENCOUNTER — Ambulatory Visit (INDEPENDENT_AMBULATORY_CARE_PROVIDER_SITE_OTHER): Payer: Medicare Other | Admitting: Family Medicine

## 2017-11-29 ENCOUNTER — Encounter: Payer: Self-pay | Admitting: Family Medicine

## 2017-11-29 VITALS — BP 136/61 | HR 73 | Ht 60.0 in | Wt 204.0 lb

## 2017-11-29 DIAGNOSIS — K5909 Other constipation: Secondary | ICD-10-CM | POA: Diagnosis not present

## 2017-11-29 DIAGNOSIS — Z23 Encounter for immunization: Secondary | ICD-10-CM

## 2017-11-29 DIAGNOSIS — I10 Essential (primary) hypertension: Secondary | ICD-10-CM | POA: Diagnosis not present

## 2017-11-29 DIAGNOSIS — E119 Type 2 diabetes mellitus without complications: Secondary | ICD-10-CM | POA: Diagnosis not present

## 2017-11-29 DIAGNOSIS — M5412 Radiculopathy, cervical region: Secondary | ICD-10-CM

## 2017-11-29 LAB — POCT GLYCOSYLATED HEMOGLOBIN (HGB A1C): Hemoglobin A1C: 5.7 % — AB (ref 4.0–5.6)

## 2017-11-29 MED ORDER — VITAMIN D (ERGOCALCIFEROL) 1.25 MG (50000 UNIT) PO CAPS: ORAL_CAPSULE | ORAL | 2 refills | Status: DC

## 2017-11-29 MED ORDER — BUTALBITAL-APAP-CAFFEINE 50-325-40 MG PO TABS: 1.0000 | ORAL_TABLET | Freq: Two times a day (BID) | ORAL | 5 refills | Status: DC | PRN

## 2017-11-29 NOTE — Progress Notes (Signed)
Subjective:    CC:   HPI:  Diabetes - no hypoglycemic events. No wounds or sores that are not healing well. No increased thirst or urination. Checking glucose at home. Taking medications as prescribed without any side effects.  Hypertension- Pt denies chest pain, SOB, dizziness, or heart palpitations.  Taking meds as directed w/o problems.  Denies medication side effects.  We d/c'd her hCTZ in her med since taking Lasix daily.  She also complains of chronic constipation.  This is been going on for quite some time but just feels like she is really struggling with it.  She takes a Senokot as every single night and says that she will usually have a bowel movement every 3 to sometimes 4 days.  She has been trying to change up her diet and increase the fiber and get more vegetables and that has helped a little but she still struggles significantly with it.  She also wanted to let me know that she is been having problems with her neck.  She is had pain particularly going down her left arm and weakness in both arms.  She also recently had an injection with Dr. Ophelia Charter in her left shoulder which has helped her shoulder but not her neck.  They did order an MRI which was done last Thursday but he does not have any follow-up appointments until August 20 to review the results.  Past medical history, Surgical history, Family history not pertinant except as noted below, Social history, Allergies, and medications have been entered into the medical record, reviewed, and corrections made.   Review of Systems: No fevers, chills, night sweats, weight loss, chest pain, or shortness of breath.   Objective:    General: Well Developed, well nourished, and in no acute distress.  Neuro: Alert and oriented x3, extra-ocular muscles intact, sensation grossly intact.  HEENT: Normocephalic, atraumatic  Skin: Warm and dry, no rashes. Cardiac: Regular rate and rhythm, 2 out of 6 systolic ejection murmur.  No rubs or gallops,  no lower extremity edema.  Respiratory: Clear to auscultation bilaterally. Not using accessory muscles, speaking in full sentences.   Impression and Recommendations:    DM - Well controlled. Continue current regimen. Follow up in  23months. Due for CMP and lipids.  She does go to eye care center for her vision so we will call to get updated report.  HTN - Well controlled. Continue current regimen. Follow up in  4 months.   Chronic constipation-recommend trial of magnesium citrate to start cleaning out her colon and adding a capful of MiraLAX daily to her Senokot-S.  Persistent thoracic pain with numbness and tingling and weakness going down both arms-she has a follow-up coming up and hopefully she will be able to get in sooner with the orthopedist to go over the results.  It does look like she probably has some compression of the spinal cord.  Prevnar 12 given today.

## 2017-11-29 NOTE — Patient Instructions (Signed)
Try using magnesium citrate to help clean out your colon.  Follow the instructions on the back.  If you need to repeat it then okay to do so.  After that then start MiraLAX, 1 capful at bedtime in addition to your Senokot-S at bedtime.  Continue for 3 weeks and see if this helps better regulate your bowels.

## 2017-11-30 ENCOUNTER — Encounter: Payer: Self-pay | Admitting: Family Medicine

## 2017-12-02 ENCOUNTER — Telehealth: Payer: Self-pay | Admitting: Family Medicine

## 2017-12-02 NOTE — Telephone Encounter (Signed)
Pt's daughter in law wants to be established.  Is it ok to put her on the schedule. (New to [facility])

## 2017-12-07 LAB — COMPLETE METABOLIC PANEL WITH GFR
AG Ratio: 2 (calc) (ref 1.0–2.5)
ALT: 19 U/L (ref 6–29)
AST: 23 U/L (ref 10–35)
Albumin: 4.1 g/dL (ref 3.6–5.1)
Alkaline phosphatase (APISO): 74 U/L (ref 33–130)
BUN: 21 mg/dL (ref 7–25)
CO2: 28 mmol/L (ref 20–32)
Calcium: 9.7 mg/dL (ref 8.6–10.4)
Chloride: 109 mmol/L (ref 98–110)
Creat: 0.62 mg/dL (ref 0.60–0.93)
GFR, Est African American: 102 mL/min/{1.73_m2} (ref >=60)
GFR, Est Non African American: 88 mL/min/{1.73_m2} (ref >=60)
Globulin: 2.1 g/dL (calc) (ref 1.9–3.7)
Glucose, Bld: 106 mg/dL — ABNORMAL HIGH (ref 65–99)
Potassium: 4.5 mmol/L (ref 3.5–5.3)
Sodium: 145 mmol/L (ref 135–146)
Total Bilirubin: 0.4 mg/dL (ref 0.2–1.2)
Total Protein: 6.2 g/dL (ref 6.1–8.1)

## 2017-12-07 LAB — CBC
HCT: 34.5 % — ABNORMAL LOW (ref 35.0–45.0)
Hemoglobin: 11.5 g/dL — ABNORMAL LOW (ref 11.7–15.5)
MCH: 30.5 pg (ref 27.0–33.0)
MCHC: 33.3 g/dL (ref 32.0–36.0)
MCV: 91.5 fL (ref 80.0–100.0)
MPV: 9.5 fL (ref 7.5–12.5)
Platelets: 235 10*3/uL (ref 140–400)
RBC: 3.77 10*6/uL — ABNORMAL LOW (ref 3.80–5.10)
RDW: 14.5 % (ref 11.0–15.0)
WBC: 4.1 10*3/uL (ref 3.8–10.8)

## 2017-12-07 LAB — FOLATE: Folate: 21.8 ng/mL

## 2017-12-07 LAB — LIPID PANEL
Cholesterol: 128 mg/dL (ref <200)
HDL: 77 mg/dL (ref >50)
LDL Cholesterol (Calc): 35 mg/dL (calc)
Non-HDL Cholesterol (Calc): 51 mg/dL (calc) (ref <130)
Total CHOL/HDL Ratio: 1.7 (calc) (ref <5.0)
Triglycerides: 75 mg/dL (ref <150)

## 2017-12-07 LAB — VITAMIN B12: Vitamin B-12: 566 pg/mL (ref 200–1100)

## 2017-12-07 LAB — FERRITIN: Ferritin: 54 ng/mL (ref 16–288)

## 2017-12-08 ENCOUNTER — Other Ambulatory Visit: Payer: Self-pay | Admitting: *Deleted

## 2017-12-08 DIAGNOSIS — D649 Anemia, unspecified: Secondary | ICD-10-CM

## 2017-12-14 ENCOUNTER — Ambulatory Visit (INDEPENDENT_AMBULATORY_CARE_PROVIDER_SITE_OTHER): Payer: Medicare Other | Admitting: Orthopaedic Surgery

## 2017-12-14 ENCOUNTER — Encounter (INDEPENDENT_AMBULATORY_CARE_PROVIDER_SITE_OTHER): Payer: Self-pay | Admitting: Orthopaedic Surgery

## 2017-12-14 VITALS — BP 132/69 | HR 69 | Ht 60.0 in | Wt 204.0 lb

## 2017-12-14 DIAGNOSIS — M4722 Other spondylosis with radiculopathy, cervical region: Secondary | ICD-10-CM

## 2017-12-14 DIAGNOSIS — M9981 Other biomechanical lesions of cervical region: Secondary | ICD-10-CM

## 2017-12-14 DIAGNOSIS — M4802 Spinal stenosis, cervical region: Secondary | ICD-10-CM

## 2017-12-14 DIAGNOSIS — M069 Rheumatoid arthritis, unspecified: Secondary | ICD-10-CM | POA: Diagnosis not present

## 2017-12-14 NOTE — Progress Notes (Signed)
Office Visit Note   Patient: Joann Maddox           Date of Birth: 18-May-1940           MRN: [id number] Visit Date: 12/14/2017              Requested by: Agapito Games, MD [address] [place], [place] [address] PCP: Agapito Games, MD   Assessment & Plan: Visit Diagnoses:  1. Foraminal stenosis of cervical region   2. Rheumatoid arthritis involving multiple sites, unspecified rheumatoid factor presence (HCC)   3. Osteoarthritis of spine with radiculopathy, cervical region     Plan: MRI scan was reviewed which shows a left side C5-6 disc osteophyte complex with moderate foraminal encroachment.  She has prominent left C6-7 spurring with moderate to severe foraminal stenosis and mild central stenosis.  There is progression of left C1-C2 arthropathy.  She has an upcoming visit with a rheumatologist within 2 weeks.  Her principal problem is been radicular symptoms left upper extremity consistent 1 C5-6 C6-7 spondylosis and foraminal stenosis on the left side.  Surgical option would be C5-6 C6-7 ACDF.  She can discuss with her rheumatologist treatment of her RA concerning the progression of C1-C2 left side arthropathy.  Cervical procedure discussed overnight stay in the hospital use of postop collar.  Risk of pseudoarthrosis discussed.  She would require intubation with a glide scope due to her C1-C2 changes by anesthesia service.  Questions were elicited and answered.  Patient states she would like to proceed due to the increased left upper extremity symptoms she is been having.  Follow-Up Instructions: No follow-ups on file.   Orders:  No orders of the defined types were placed in this encounter.  No orders of the defined types were placed in this encounter.     Procedures: No procedures performed   Clinical Data: No additional findings.   Subjective: Chief Complaint  Patient presents with  . Neck - Pain, Follow-up    MRI review    HPI  77 year old female with rheumatoid arthritis seen with ongoing neck pain and radicular left arm pain.  She states her pain is severe she is had previous cervical epidural injections as well as facet injections.  She is been through therapy anti-inflammatories.  She takes methotrexate for rheumatoid arthritis.  She states she previously had infusion treatment for rheumatoid arthritis but had problems with that treatment.  She has pain with rotation of her neck pain that radiates down the radial side of her left hand with numbness and notices weakness in her left upper extremity.  She denies weakness on the right side.  Patient had subacromial injection in her shoulder and got slight improvement in her pain for a day or so.  She continues to have some decreased range of motion left shoulder but motion of her shoulder does not reproduce her arm pain.  Cervical MRI scan has been obtained and is available for review today with patient and her husband.  Review of Systems 14 point review of systems positive for rheumatoid arthritis, bilateral hip arthroplasties bilateral total knee arthroplasties.  Hyperlipidemia hypothyroidism and urge incontinence obesity, left shoulder arthritic changes with loss of joint space.  Cervical spondylosis with left-sided foraminal encroachment C5-6 C6-7.  Otherwise negative as it pertains HPI.   Objective: Vital Signs: BP 132/69   Pulse 69   Ht 5' (1.524 m)   Wt 204 lb (92.5 kg)   BMI 39.84 kg/m  Physical Exam  Constitutional: She is oriented to person, place, and time. She appears well-developed.  HENT:  Head: Normocephalic.  Right Ear: External ear normal.  Left Ear: External ear normal.  Eyes: Pupils are equal, round, and reactive to light.  Neck: No tracheal deviation present. No thyromegaly present.  Cardiovascular: Normal rate.  Pulmonary/Chest: Effort normal.  Abdominal: Soft.  Neurological: She is alert and oriented to person, place, and time.  Skin: Skin is  warm and dry.  Psychiatric: She has a normal mood and affect. Her behavior is normal.    Ortho Exam patient has 50% forward flexion and extension of her neck.  Rotation of the right 50% without pain.  She rotates 30 degrees of the left with increased pain.  Positive brachial plexus tenderness on the left negative on the right.  Biceps triceps brachioradialis reflexes are 1+.  She has enlargement of PIP joints with minimal ulnar drift.  40 degrees of flexion left shoulder with pain internal/external rotation 50% with pain.  Long head of the biceps is stable.  Decreased sensation C6 distribution in the left hand normal on the right.  Median nerve at the wrist ulnar nerve at the elbow and median nerve at the forearm is nontender.  Well-healed total knee arthroplasty and bilateral total hip arthroplasty incisions without instability and all incisions are well-healed.  Specialty Comments:  No specialty comments available.  Imaging: CLINICAL DATA:  Neck pain bilateral shoulder pain  EXAM: MRI CERVICAL SPINE WITHOUT CONTRAST  TECHNIQUE: Multiplanar, multisequence MR imaging of the cervical spine was performed. No intravenous contrast was administered.  COMPARISON:  MRI cervical spine 08/31/2015  FINDINGS: Alignment: Normal alignment. Straightening of the cervical lordosis with slight kyphosis.  Vertebrae: Normal bone marrow.  Negative for fracture or mass  Cord: Normal spinal cord signal without focal cord lesion.  Posterior Fossa, vertebral arteries, paraspinal tissues: Negative  Disc levels:  C1-2: Advanced asymmetric arthropathy on the left with marked bony overgrowth which has progressed. This is causing marked left foraminal encroachment at C1-2.  C2-3: Mild facet degeneration on the left with mild left foraminal narrowing  C3-4: Moderate left facet hypertrophy with mild left foraminal narrowing  C4-5: Small central disc protrusion and spurring unchanged.  Mild facet degeneration on the right. No significant stenosis  C5-6: Central left-sided osteophyte. Cord flattening on the left with moderate left foraminal encroachment unchanged from the prior study.  C6-7: Prominent left-sided uncinate spur causing moderate to severe left foraminal encroachment unchanged from the prior study. Cord flattening on the left with mild spinal stenosis  C7-T1: Mild right foraminal narrowing due to facet degeneration.  IMPRESSION: Advanced degenerative changes C1-2 on the left with marked bony overgrowth and left foraminal encroachment. This has progressed since 2017  Mild left foraminal narrowing C2-3 and C3-4 due to facet degeneration and spurring  Left-sided osteophyte at C5-6 with moderate left foraminal encroachment unchanged.  Prominent left-sided uncinate spur C6-7 with moderate to severe left foraminal encroachment and mild spinal stenosis unchanged from the prior MRI.   Electronically Signed   By: Marlan Palau M.D.   On: 11/25/2017 16:38   PMFS History: Patient Active Problem List   Diagnosis Date Noted  . Neck pain 10/20/2017  . Chronic venous insufficiency 04/29/2017  . Cervical spondylosis 07/20/2016  . 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HCC) 02/12/2015  . Type 2 diabetes mellitus without complication (HCC) 08/10/2014  . Chronic constipation 02/19/2014  . Rosacea  11/06/2013  . GAD (generalized anxiety disorder) 06/08/2013  . Essential hypertension, benign 05/04/2013  . Rheumatoid arthritis (HCC) 05/04/2013  . Migraine with aura 05/04/2013  . Palpitations 05/04/2013  . Murmur, heart 05/04/2013  . Hyperlipidemia 05/04/2013  . Hypothyroidism 05/04/2013  . Fatigue 02/16/2012  . Obesity, morbid (HCC) 02/16/2012  . Urge incontinence 02/16/2012  . Hyperglycemia 01/12/2012   Past Medical History:  Diagnosis Date  . Anxiety   . Headache(784.0)     migrains  . Heart murmur   . Hypothyroidism   . Rheumatoid arthritis(714.0)     Family History  Problem Relation Age of Onset  . Heart disease Father 65  . Diabetes Unknown        aunt   . Stroke Mother 66    Past Surgical History:  Procedure Laterality Date  . ABDOMINAL HYSTERECTOMY    . APPENDECTOMY    . CHOLECYSTECTOMY    . REPLACEMENT TOTAL KNEE BILATERAL    . TONSILLECTOMY     as  a child  . TOTAL HIP ARTHROPLASTY     right and left   Social History   Occupational History  . Occupation: Retired Runner, broadcasting/film/video    Comment: Corporate treasurer  Tobacco Use  . Smoking status: Never Smoker  . Smokeless tobacco: Never Used  Substance and Sexual Activity  . Alcohol use: No  . Drug use: Not on file  . Sexual activity: Not on file

## 2017-12-14 NOTE — H&P (View-Only) (Signed)
Office Visit Note   Patient: Joann Maddox           Date of Birth: 18-May-1940           MRN: [id number] Visit Date: 12/14/2017              Requested by: Agapito Games, MD [address] [place], [place] [address] PCP: Agapito Games, MD   Assessment & Plan: Visit Diagnoses:  1. Foraminal stenosis of cervical region   2. Rheumatoid arthritis involving multiple sites, unspecified rheumatoid factor presence (HCC)   3. Osteoarthritis of spine with radiculopathy, cervical region     Plan: MRI scan was reviewed which shows a left side C5-6 disc osteophyte complex with moderate foraminal encroachment.  She has prominent left C6-7 spurring with moderate to severe foraminal stenosis and mild central stenosis.  There is progression of left C1-C2 arthropathy.  She has an upcoming visit with a rheumatologist within 2 weeks.  Her principal problem is been radicular symptoms left upper extremity consistent 1 C5-6 C6-7 spondylosis and foraminal stenosis on the left side.  Surgical option would be C5-6 C6-7 ACDF.  She can discuss with her rheumatologist treatment of her RA concerning the progression of C1-C2 left side arthropathy.  Cervical procedure discussed overnight stay in the hospital use of postop collar.  Risk of pseudoarthrosis discussed.  She would require intubation with a glide scope due to her C1-C2 changes by anesthesia service.  Questions were elicited and answered.  Patient states she would like to proceed due to the increased left upper extremity symptoms she is been having.  Follow-Up Instructions: No follow-ups on file.   Orders:  No orders of the defined types were placed in this encounter.  No orders of the defined types were placed in this encounter.     Procedures: No procedures performed   Clinical Data: No additional findings.   Subjective: Chief Complaint  Patient presents with  . Neck - Pain, Follow-up    MRI review    HPI  77 year old female with rheumatoid arthritis seen with ongoing neck pain and radicular left arm pain.  She states her pain is severe she is had previous cervical epidural injections as well as facet injections.  She is been through therapy anti-inflammatories.  She takes methotrexate for rheumatoid arthritis.  She states she previously had infusion treatment for rheumatoid arthritis but had problems with that treatment.  She has pain with rotation of her neck pain that radiates down the radial side of her left hand with numbness and notices weakness in her left upper extremity.  She denies weakness on the right side.  Patient had subacromial injection in her shoulder and got slight improvement in her pain for a day or so.  She continues to have some decreased range of motion left shoulder but motion of her shoulder does not reproduce her arm pain.  Cervical MRI scan has been obtained and is available for review today with patient and her husband.  Review of Systems 14 point review of systems positive for rheumatoid arthritis, bilateral hip arthroplasties bilateral total knee arthroplasties.  Hyperlipidemia hypothyroidism and urge incontinence obesity, left shoulder arthritic changes with loss of joint space.  Cervical spondylosis with left-sided foraminal encroachment C5-6 C6-7.  Otherwise negative as it pertains HPI.   Objective: Vital Signs: BP 132/69   Pulse 69   Ht 5' (1.524 m)   Wt 204 lb (92.5 kg)   BMI 39.84 kg/m  Physical Exam  Constitutional: She is oriented to person, place, and time. She appears well-developed.  HENT:  Head: Normocephalic.  Right Ear: External ear normal.  Left Ear: External ear normal.  Eyes: Pupils are equal, round, and reactive to light.  Neck: No tracheal deviation present. No thyromegaly present.  Cardiovascular: Normal rate.  Pulmonary/Chest: Effort normal.  Abdominal: Soft.  Neurological: She is alert and oriented to person, place, and time.  Skin: Skin is  warm and dry.  Psychiatric: She has a normal mood and affect. Her behavior is normal.    Ortho Exam patient has 50% forward flexion and extension of her neck.  Rotation of the right 50% without pain.  She rotates 30 degrees of the left with increased pain.  Positive brachial plexus tenderness on the left negative on the right.  Biceps triceps brachioradialis reflexes are 1+.  She has enlargement of PIP joints with minimal ulnar drift.  40 degrees of flexion left shoulder with pain internal/external rotation 50% with pain.  Long head of the biceps is stable.  Decreased sensation C6 distribution in the left hand normal on the right.  Median nerve at the wrist ulnar nerve at the elbow and median nerve at the forearm is nontender.  Well-healed total knee arthroplasty and bilateral total hip arthroplasty incisions without instability and all incisions are well-healed.  Specialty Comments:  No specialty comments available.  Imaging: CLINICAL DATA:  Neck pain bilateral shoulder pain  EXAM: MRI CERVICAL SPINE WITHOUT CONTRAST  TECHNIQUE: Multiplanar, multisequence MR imaging of the cervical spine was performed. No intravenous contrast was administered.  COMPARISON:  MRI cervical spine 08/31/2015  FINDINGS: Alignment: Normal alignment. Straightening of the cervical lordosis with slight kyphosis.  Vertebrae: Normal bone marrow.  Negative for fracture or mass  Cord: Normal spinal cord signal without focal cord lesion.  Posterior Fossa, vertebral arteries, paraspinal tissues: Negative  Disc levels:  C1-2: Advanced asymmetric arthropathy on the left with marked bony overgrowth which has progressed. This is causing marked left foraminal encroachment at C1-2.  C2-3: Mild facet degeneration on the left with mild left foraminal narrowing  C3-4: Moderate left facet hypertrophy with mild left foraminal narrowing  C4-5: Small central disc protrusion and spurring unchanged.  Mild facet degeneration on the right. No significant stenosis  C5-6: Central left-sided osteophyte. Cord flattening on the left with moderate left foraminal encroachment unchanged from the prior study.  C6-7: Prominent left-sided uncinate spur causing moderate to severe left foraminal encroachment unchanged from the prior study. Cord flattening on the left with mild spinal stenosis  C7-T1: Mild right foraminal narrowing due to facet degeneration.  IMPRESSION: Advanced degenerative changes C1-2 on the left with marked bony overgrowth and left foraminal encroachment. This has progressed since 2017  Mild left foraminal narrowing C2-3 and C3-4 due to facet degeneration and spurring  Left-sided osteophyte at C5-6 with moderate left foraminal encroachment unchanged.  Prominent left-sided uncinate spur C6-7 with moderate to severe left foraminal encroachment and mild spinal stenosis unchanged from the prior MRI.   Electronically Signed   By: Charles  Clark M.D.   On: 11/25/2017 16:38   PMFS History: Patient Active Problem List   Diagnosis Date Noted  . Neck pain 10/20/2017  . Chronic venous insufficiency 04/29/2017  . Cervical spondylosis 07/20/2016  . 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HCC) 02/12/2015  . Type 2 diabetes mellitus without complication (HCC) 08/10/2014  . Chronic constipation 02/19/2014  . Rosacea   11/06/2013  . GAD (generalized anxiety disorder) 06/08/2013  . Essential hypertension, benign 05/04/2013  . Rheumatoid arthritis (HCC) 05/04/2013  . Migraine with aura 05/04/2013  . Palpitations 05/04/2013  . Murmur, heart 05/04/2013  . Hyperlipidemia 05/04/2013  . Hypothyroidism 05/04/2013  . Fatigue 02/16/2012  . Obesity, morbid (HCC) 02/16/2012  . Urge incontinence 02/16/2012  . Hyperglycemia 01/12/2012   Past Medical History:  Diagnosis Date  . Anxiety   . Headache(784.0)     migrains  . Heart murmur   . Hypothyroidism   . Rheumatoid arthritis(714.0)     Family History  Problem Relation Age of Onset  . Heart disease Father 65  . Diabetes Unknown        aunt   . Stroke Mother 66    Past Surgical History:  Procedure Laterality Date  . ABDOMINAL HYSTERECTOMY    . APPENDECTOMY    . CHOLECYSTECTOMY    . REPLACEMENT TOTAL KNEE BILATERAL    . TONSILLECTOMY     as  a child  . TOTAL HIP ARTHROPLASTY     right and left   Social History   Occupational History  . Occupation: Retired Runner, broadcasting/film/video    Comment: Corporate treasurer  Tobacco Use  . Smoking status: Never Smoker  . Smokeless tobacco: Never Used  Substance and Sexual Activity  . Alcohol use: No  . Drug use: Not on file  . Sexual activity: Not on file

## 2017-12-15 ENCOUNTER — Encounter (INDEPENDENT_AMBULATORY_CARE_PROVIDER_SITE_OTHER): Payer: Self-pay | Admitting: Orthopaedic Surgery

## 2017-12-15 DIAGNOSIS — M4802 Spinal stenosis, cervical region: Secondary | ICD-10-CM | POA: Insufficient documentation

## 2017-12-20 ENCOUNTER — Encounter: Payer: Self-pay | Admitting: Orthopaedic Surgery

## 2017-12-20 ENCOUNTER — Telehealth: Payer: Self-pay

## 2017-12-20 NOTE — Telephone Encounter (Signed)
Note under letters.  I routed it to [employer].

## 2017-12-20 NOTE — Telephone Encounter (Signed)
PT needs a letter releasing her to have surgery on 12/24/17. Please fax the letter to [phone] or email [email or web address]

## 2017-12-21 NOTE — Telephone Encounter (Signed)
Letter faxed.

## 2017-12-21 NOTE — Telephone Encounter (Signed)
Form for surgical clearance completed, faxed, confirmation received, and scanned into chart.Joann Maddox [place]

## 2017-12-22 ENCOUNTER — Encounter (INDEPENDENT_AMBULATORY_CARE_PROVIDER_SITE_OTHER): Payer: Self-pay | Admitting: Surgery

## 2017-12-22 ENCOUNTER — Encounter (HOSPITAL_COMMUNITY): Payer: Self-pay

## 2017-12-22 ENCOUNTER — Ambulatory Visit (INDEPENDENT_AMBULATORY_CARE_PROVIDER_SITE_OTHER): Payer: Medicare Other | Admitting: Surgery

## 2017-12-22 VITALS — BP 118/78 | Ht 59.0 in | Wt 205.0 lb

## 2017-12-22 DIAGNOSIS — M4722 Other spondylosis with radiculopathy, cervical region: Secondary | ICD-10-CM

## 2017-12-22 NOTE — Pre-Procedure Instructions (Signed)
Joann Maddox  12/22/2017      [id number] [place], [place] - [address] [place] [address] Phone: [phone] Fax: [phone]    Your procedure is scheduled on   Friday 12/24/17  Report to [facility] Admitting at 530 A.M.  Call this number if you have problems the morning of surgery:  [phone]   Remember:  Do not eat or drink after midnight.      Take these medicines the morning of surgery with A SIP OF WATER -  DULOXETINE (CYMBALTA), LEVOTHYROXINE,  TRAMADOL IF NEEDED  7 days prior to surgery STOP taking any Aspirin(unless otherwise instructed by your surgeon), Aleve, Naproxen, MELOXICAM/MOBIC,  Ibuprofen, Motrin, Advil, Goody's, BC's, all herbal medications, fish oil, and all vitamins    How to Manage Your Diabetes Before and After Surgery  Why is it important to control my blood sugar before and after surgery? . Improving blood sugar levels before and after surgery helps healing and can limit problems. . A way of improving blood sugar control is eating a healthy diet by: o  Eating less sugar and carbohydrates o  Increasing activity/exercise o  Talking with your doctor about reaching your blood sugar goals . High blood sugars (greater than 180 mg/dL) can raise your risk of infections and slow your recovery, so you will need to focus on controlling your diabetes during the weeks before surgery. . Make sure that the doctor who takes care of your diabetes knows about your planned surgery including the date and location.  How do I manage my blood sugar before surgery? . Check your blood sugar at least 4 times a day, starting 2 days before surgery, to make sure that the level is not too high or low. o Check your blood sugar the morning of your surgery when you wake up and every 2 hours until you get to the Short Stay unit. . If your blood sugar is less than 70 mg/dL, you will need to treat for low blood sugar: o Do  not take insulin. o Treat a low blood sugar (less than 70 mg/dL) with  cup of clear juice (cranberry or apple), 4 glucose tablets, OR glucose gel. Recheck blood sugar in 15 minutes after treatment (to make sure it is greater than 70 mg/dL). If your blood sugar is not greater than 70 mg/dL on recheck, call [phone] o  for further instructions. . Report your blood sugar to the short stay nurse when you get to Short Stay.  . If you are admitted to the hospital after surgery: o Your blood sugar will be checked by the staff and you will probably be given insulin after surgery (instead of oral diabetes medicines) to make sure you have good blood sugar levels. o The goal for blood sugar control after surgery is 80-180 mg/dL.        WHAT DO I DO ABOUT MY DIABETES MEDICATION?   Marland Kitchen Do not take oral diabetes medicines (pills) the morning of surgery.    .     Do not wear jewelry, make-up or nail polish.  Do not wear lotions, powders, or perfumes, or deodorant.  Do not shave 48 hours prior to surgery.  Men may shave face and neck.  Do not bring valuables to the hospital.  [facility] is not responsible for any belongings or valuables.  Contacts, dentures or bridgework may not be worn into surgery.  Leave your suitcase in the car.  After surgery it may be brought to your room.  For patients admitted to the hospital, discharge time will be determined by your treatment team.  Patients discharged the day of surgery will not be allowed to drive home.   Name and phone number of your driver:    Special instructions:  [facility] - Preparing for Surgery  Before surgery, you can play an important role.  Because skin is not sterile, your skin needs to be as free of germs as possible.  You can reduce the number of germs on you skin by washing with CHG (chlorahexidine gluconate) soap before surgery.  CHG is an antiseptic cleaner which kills germs and bonds with the skin to continue killing germs  even after washing.  Oral Hygiene is also important in reducing the risk of infection.  Remember to brush your teeth with your regular toothpaste the morning of surgery.  Please DO NOT use if you have an allergy to CHG or antibacterial soaps.  If your skin becomes reddened/irritated stop using the CHG and inform your nurse when you arrive at Short Stay.  Do not shave (including legs and underarms) for at least 48 hours prior to the first CHG shower.  You may shave your face.  Please follow these instructions carefully:   1.  Shower with CHG Soap the night before surgery and the morning of Surgery.  2.  If you choose to wash your hair, wash your hair first as usual with your normal shampoo.  3.  After you shampoo, rinse your hair and body thoroughly to remove the shampoo. 4.  Use CHG as you would any other liquid soap.  You can apply chg directly to the skin and wash gently with a      scrungie or washcloth.           5.  Apply the CHG Soap to your body ONLY FROM THE NECK DOWN.   Do not use on open wounds or open sores. Avoid contact with your eyes, ears, mouth and genitals (private parts).  Wash genitals (private parts) with your normal soap.  6.  Wash thoroughly, paying special attention to the area where your surgery will be performed.  7.  Thoroughly rinse your body with warm water from the neck down.  8.  DO NOT shower/wash with your normal soap after using and rinsing off the CHG Soap.  9.  Pat yourself dry with a clean towel.            10.  Wear clean pajamas.            11.  Place clean sheets on your bed the night of your first shower and do not sleep with pets.  Day of Surgery  Do not apply any lotions/deoderants the morning of surgery.   Please wear clean clothes to the hospital/surgery center. Remember to brush your teeth with toothpaste.     Please read over the following fact sheets that you were given. Pain Booklet, MRSA Information and Surgical Site Infection  Prevention

## 2017-12-22 NOTE — Progress Notes (Signed)
Patient with history of C5-6 and C6-7 stenosis/HNP comes in for preop evaluation.  Today full history and physical performed.

## 2017-12-23 ENCOUNTER — Other Ambulatory Visit: Payer: Self-pay

## 2017-12-23 ENCOUNTER — Encounter (HOSPITAL_COMMUNITY): Payer: Self-pay

## 2017-12-23 ENCOUNTER — Ambulatory Visit (HOSPITAL_COMMUNITY)
Admission: RE | Admit: 2017-12-23 | Discharge: 2017-12-23 | Disposition: A | Discharge status: Home / Self Care | Payer: Medicare Other | Source: Ambulatory Visit | Attending: Surgery | Admitting: Surgery

## 2017-12-23 ENCOUNTER — Encounter (HOSPITAL_COMMUNITY)
Admission: RE | Admit: 2017-12-23 | Discharge: 2017-12-23 | Disposition: A | Discharge status: Home / Self Care | Payer: Medicare Other | Source: Ambulatory Visit | Attending: Orthopaedic Surgery | Admitting: Orthopaedic Surgery

## 2017-12-23 DIAGNOSIS — Z79890 Hormone replacement therapy (postmenopausal): Secondary | ICD-10-CM | POA: Insufficient documentation

## 2017-12-23 DIAGNOSIS — Z79899 Other long term (current) drug therapy: Secondary | ICD-10-CM | POA: Insufficient documentation

## 2017-12-23 DIAGNOSIS — F419 Anxiety disorder, unspecified: Secondary | ICD-10-CM | POA: Insufficient documentation

## 2017-12-23 DIAGNOSIS — F329 Major depressive disorder, single episode, unspecified: Secondary | ICD-10-CM | POA: Diagnosis not present

## 2017-12-23 DIAGNOSIS — Z01812 Encounter for preprocedural laboratory examination: Secondary | ICD-10-CM | POA: Diagnosis present

## 2017-12-23 DIAGNOSIS — Z7984 Long term (current) use of oral hypoglycemic drugs: Secondary | ICD-10-CM | POA: Diagnosis not present

## 2017-12-23 DIAGNOSIS — R7303 Prediabetes: Secondary | ICD-10-CM | POA: Insufficient documentation

## 2017-12-23 DIAGNOSIS — E039 Hypothyroidism, unspecified: Secondary | ICD-10-CM | POA: Insufficient documentation

## 2017-12-23 DIAGNOSIS — Z96643 Presence of artificial hip joint, bilateral: Secondary | ICD-10-CM | POA: Insufficient documentation

## 2017-12-23 DIAGNOSIS — M069 Rheumatoid arthritis, unspecified: Secondary | ICD-10-CM | POA: Insufficient documentation

## 2017-12-23 DIAGNOSIS — R011 Cardiac murmur, unspecified: Secondary | ICD-10-CM | POA: Insufficient documentation

## 2017-12-23 DIAGNOSIS — Z01810 Encounter for preprocedural cardiovascular examination: Secondary | ICD-10-CM | POA: Diagnosis present

## 2017-12-23 DIAGNOSIS — Z6841 Body Mass Index (BMI) 40.0 and over, adult: Secondary | ICD-10-CM | POA: Diagnosis not present

## 2017-12-23 DIAGNOSIS — Z01818 Encounter for other preprocedural examination: Secondary | ICD-10-CM

## 2017-12-23 HISTORY — DX: Major depressive disorder, single episode, unspecified: F32.9

## 2017-12-23 HISTORY — DX: Unspecified osteoarthritis, unspecified site: M19.90

## 2017-12-23 HISTORY — DX: Depression, unspecified: F32.A

## 2017-12-23 HISTORY — DX: Prediabetes: R73.03

## 2017-12-23 LAB — COMPREHENSIVE METABOLIC PANEL
ALT: 20 U/L (ref 0–44)
AST: 25 U/L (ref 15–41)
Albumin: 3.7 g/dL (ref 3.5–5.0)
Alkaline Phosphatase: 66 U/L (ref 38–126)
Anion gap: 7 (ref 5–15)
BUN: 17 mg/dL (ref 8–23)
CO2: 29 mmol/L (ref 22–32)
Calcium: 9.5 mg/dL (ref 8.9–10.3)
Chloride: 106 mmol/L (ref 98–111)
Creatinine, Ser: 0.7 mg/dL (ref 0.44–1.00)
GFR calc Af Amer: >60 mL/min (ref >60)
GFR calc non Af Amer: >60 mL/min (ref >60)
Glucose, Bld: 86 mg/dL (ref 70–99)
Potassium: 3.9 mmol/L (ref 3.5–5.1)
Sodium: 142 mmol/L (ref 135–145)
Total Bilirubin: 0.7 mg/dL (ref 0.3–1.2)
Total Protein: 6.3 g/dL — ABNORMAL LOW (ref 6.5–8.1)

## 2017-12-23 LAB — URINALYSIS, ROUTINE W REFLEX MICROSCOPIC
Bilirubin Urine: NEGATIVE
Glucose, UA: NEGATIVE mg/dL
Hgb urine dipstick: NEGATIVE
Ketones, ur: NEGATIVE mg/dL
Leukocytes, UA: NEGATIVE
Nitrite: NEGATIVE
Protein, ur: NEGATIVE mg/dL
Specific Gravity, Urine: >1.03 — ABNORMAL HIGH (ref 1.005–1.030)
pH: 6.5 (ref 5.0–8.0)

## 2017-12-23 LAB — CBC
HCT: 36.2 % (ref 36.0–46.0)
Hemoglobin: 11.1 g/dL — ABNORMAL LOW (ref 12.0–15.0)
MCH: 30.3 pg (ref 26.0–34.0)
MCHC: 30.7 g/dL (ref 30.0–36.0)
MCV: 98.9 fL (ref 78.0–100.0)
Platelets: 215 10*3/uL (ref 150–400)
RBC: 3.66 MIL/uL — ABNORMAL LOW (ref 3.87–5.11)
RDW: 14.7 % (ref 11.5–15.5)
WBC: 5.6 10*3/uL (ref 4.0–10.5)

## 2017-12-23 LAB — SURGICAL PCR SCREEN
MRSA, PCR: NEGATIVE
Staphylococcus aureus: NEGATIVE

## 2017-12-23 NOTE — Progress Notes (Signed)
Anesthesia Chart Review:  Case:  [id number] Date/Time:  12/24/17 0715   Procedure:  C5-6, C6-7 ANTERIOR CERVICAL DECOMPRESSION/DISCECTOMY, ALLOGRAFT, PLATE (N/A )   Anesthesia type:  General   Pre-op diagnosis:  C5-6, C6-7 SPONDYLOSIS, LEFT FORAMINAL STENOSIS   Location:  [facility] ROOM 05 / [facility]   Surgeon:  Eldred Manges, MD    SPECIAL NEEDS: "Gildescope intubation"  DISCUSSION: Patient is a 77 year old female scheduled for the above procedure. Case was scheduled for 12/24/17, but was cancelled shortly after her PAT visit due to insurance.   History includes never smoker, murmur, hypothyroidism, RA, pre-diabetes. Superficial thrombophlebitis right calf 10/15/12.  BMI is consistent with morbid obesity.   Although surgery is currently not scheduled, PAT test results brought for anesthesia review in anticipation for surgery being rescheduled in the future. Preoperative EKG showed inferior infarct (although no Q wave in II). She also has a history of an echo in 2013 for evaluation of a murmur (Dr. Linford Arnold described as II/VI SEM). I contacted [facility] (where it appears echo was done, although unclear who ordered) and [facility] for old records. [facility] did not have a prior EKG. So far, I have not received any records from [facility]. Patient's current PCP is Agapito Games, MD with [facility]. I do not see a prior EKG or echo in the [facility]. I reviewed above with anesthesiologist Marcene Duos, MD. Recommend that prior to case being rescheduled, we obtain additional input from Dr. Luther Hearing her to review EKG and further characterize murmur history. He thinks preoperative echocardiogram may need to be considered, particularly if 2013 echo results unknown. I have sent a staff message to Dr. Linford Arnold and can re-address with anesthesiologists if needed once additional input received. I also left a voice message with Eunice Blase at Dr. Ophelia Charter' office with  update.     VS: BP (!) 129/52   Pulse 70   Temp 36.6 C   Resp 20   Ht 4\' 11"  (1.499 m)   Wt 94 kg   SpO2 100%   BMI 41.85 kg/m   PROVIDERS: , MD is PCP. Last visit 11/29/17.  She composed a letter of clearance (see Letters tab) stating, "She is cleared for surgery and has no concerning issues that would prevent her from undergoing the procedure." - 01/29/18, MD is rheumatologist.   LABS: Labs reviewed: Acceptable for surgery. (all labs ordered are listed, but only abnormal results are displayed)  Labs Reviewed  CBC - Abnormal; Notable for the following components:      Result Value   RBC 3.66 (*)    Hemoglobin 11.1 (*)    All other components within normal limits  COMPREHENSIVE METABOLIC PANEL - Abnormal; Notable for the following components:   Total Protein 6.3 (*)    All other components within normal limits  URINALYSIS, ROUTINE W REFLEX MICROSCOPIC - Abnormal; Notable for the following components:   APPearance CLOUDY (*)    Specific Gravity, Urine >1.030 (*)    All other components within normal limits  SURGICAL PCR SCREEN     IMAGES: CXR 12/23/17:  IMPRESSION: No acute abnormalities.   EKG: 12/23/17: NSR, minimal voltage criteria for LVH, any be normal variant. 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 There is no comparison EKG in Epic or Muse. She had prior joint replacement surgery at [facility], but they do not have an old EKG in their system either.   CV: Echo 05/01/11 ([facility]): Requested, but unclear if we will receive as it appears echo was ordered by a PCP whom she is no longer seeing.    Old records indicate that she had a preoperative Cardiolite study per cardiologist Dr. Shelva Majestic before 04/2001 right THA. These records are unavailable.   Past Medical History:  Diagnosis Date  . Anxiety   . Arthritis   . Depression   . Headache(784.0)    migrains  . Heart murmur   . Hypothyroidism   . Pre-diabetes   .  Rheumatoid arthritis(714.0)     Past Surgical History:  Procedure Laterality Date  . ABDOMINAL HYSTERECTOMY    . APPENDECTOMY    . CHOLECYSTECTOMY    . REPLACEMENT TOTAL KNEE BILATERAL    . TONSILLECTOMY     as  a child  . TOTAL HIP ARTHROPLASTY     right and left    MEDICATIONS: . AMBULATORY NON FORMULARY MEDICATION  . AZELEX 20 % cream  . butalbital-acetaminophen-caffeine (FIORICET, ESGIC) 50-325-40 MG tablet  . DULoxetine (CYMBALTA) 60 MG capsule  . folic acid (FOLVITE) 1 MG tablet  . furosemide (LASIX) 20 MG tablet  . hydroxychloroquine (PLAQUENIL) 200 MG tablet  . KLOR-CON 10 10 MEQ tablet  . levothyroxine (SYNTHROID, LEVOTHROID) 25 MCG tablet  . lisinopril (PRINIVIL,ZESTRIL) 10 MG tablet  . lisinopril-hydrochlorothiazide (PRINZIDE,ZESTORETIC) 20-25 MG tablet  . meloxicam (MOBIC) 15 MG tablet  . metFORMIN (GLUCOPHAGE) 500 MG tablet  . methotrexate 50 MG/2ML injection  . Multiple Vitamin (MULTIVITAMIN) tablet  . MYRBETRIQ 25 MG TB24 tablet  . nystatin (NYSTATIN) powder  . Omega-3 Fatty Acids (FISH OIL) 1200 MG CAPS  . rosuvastatin (CRESTOR) 10 MG tablet  . senna (SENOKOT) 8.6 MG tablet  . traMADol (ULTRAM) 50 MG tablet  . TURMERIC PO  . vitamin B-12 (CYANOCOBALAMIN) 100 MCG tablet  . Vitamin D, Ergocalciferol, (DRISDOL) 50000 units CAPS capsule   No current facility-administered medications for this encounter.     Velna Ochs [facility] Short Stay Center/Anesthesiology Phone [phone] 12/24/2017 12:52 PM

## 2017-12-23 NOTE — Pre-Procedure Instructions (Signed)
MALORY SPURR  12/23/2017      [id number] Lorenza Evangelist, [place] - [address] [place] [address] Phone: [phone] Fax: [phone]    Your procedure is scheduled on   Friday 12/24/17  Report to [facility] Admitting at 530 A.M.  Call this number if you have problems the morning of surgery:  [phone]   Remember:  Do not eat or drink after midnight.      Take these medicines the morning of surgery with A SIP OF WATER -  DULOXETINE (CYMBALTA), LEVOTHYROXINE,  TRAMADOL IF NEEDED  7 days prior to surgery STOP taking any Aspirin(unless otherwise instructed by your surgeon), Aleve, Naproxen, MELOXICAM/MOBIC,  Ibuprofen, Motrin, Advil, Goody's, BC's, all herbal medications, fish oil, and all vitamins    How to Manage Your Diabetes Before and After Surgery  Why is it important to control my blood sugar before and after surgery? . Improving blood sugar levels before and after surgery helps healing and can limit problems. . A way of improving blood sugar control is eating a healthy diet by: o  Eating less sugar and carbohydrates o  Increasing activity/exercise o  Talking with your doctor about reaching your blood sugar goals . High blood sugars (greater than 180 mg/dL) can raise your risk of infections and slow your recovery, so you will need to focus on controlling your diabetes during the weeks before surgery. . Make sure that the doctor who takes care of your diabetes knows about your planned surgery including the date and location.  How do I manage my blood sugar before surgery? . Check your blood sugar at least 4 times a day, starting 2 days before surgery, to make sure that the level is not too high or low. o Check your blood sugar the morning of your surgery when you wake up and every 2 hours until you get to the Short Stay unit. . If your blood sugar is less than 70 mg/dL, you will need to treat for low blood sugar: o Do  not take insulin. o Treat a low blood sugar (less than 70 mg/dL) with  cup of clear juice (cranberry or apple), 4 glucose tablets, OR glucose gel. Recheck blood sugar in 15 minutes after treatment (to make sure it is greater than 70 mg/dL). If your blood sugar is not greater than 70 mg/dL on recheck, call [phone] o  for further instructions. . Report your blood sugar to the short stay nurse when you get to Short Stay.  . If you are admitted to the hospital after surgery: o Your blood sugar will be checked by the staff and you will probably be given insulin after surgery (instead of oral diabetes medicines) to make sure you have good blood sugar levels. o The goal for blood sugar control after surgery is 80-180 mg/dL.        WHAT DO I DO ABOUT MY DIABETES MEDICATION?   Marland Kitchen Do not take oral diabetes medicines (pills) the morning of surgery.       Metformin   .     Do not wear jewelry, make-up or nail polish.  Do not wear lotions, powders, or perfumes, or deodorant.  Do not shave 48 hours prior to surgery.  Men may shave face and neck.  Do not bring valuables to the hospital.  [facility] is not responsible for any belongings or valuables.  Contacts, dentures or bridgework may not be worn into surgery.  Leave your suitcase in the car.  After surgery it may be brought to your room.  For patients admitted to the hospital, discharge time will be determined by your treatment team.  Patients discharged the day of surgery will not be allowed to drive home.   Name and phone number of your driver:    Special instructions:  [place] - Preparing for Surgery  Before surgery, you can play an important role.  Because skin is not sterile, your skin needs to be as free of germs as possible.  You can reduce the number of germs on you skin by washing with CHG (chlorahexidine gluconate) soap before surgery.  CHG is an antiseptic cleaner which kills germs and bonds with the skin to continue  killing germs even after washing.  Oral Hygiene is also important in reducing the risk of infection.  Remember to brush your teeth with your regular toothpaste the morning of surgery.  Please DO NOT use if you have an allergy to CHG or antibacterial soaps.  If your skin becomes reddened/irritated stop using the CHG and inform your nurse when you arrive at Short Stay.  Do not shave (including legs and underarms) for at least 48 hours prior to the first CHG shower.  You may shave your face.  Please follow these instructions carefully:   1.  Shower with CHG Soap the night before surgery and the morning of Surgery.  2.  If you choose to wash your hair, wash your hair first as usual with your normal shampoo.  3.  After you shampoo, rinse your hair and body thoroughly to remove the shampoo. 4.  Use CHG as you would any other liquid soap.  You can apply chg directly to the skin and wash gently with a      scrungie or washcloth.           5.  Apply the CHG Soap to your body ONLY FROM THE NECK DOWN.   Do not use on open wounds or open sores. Avoid contact with your eyes, ears, mouth and genitals (private parts).  Wash genitals (private parts) with your normal soap.  6.  Wash thoroughly, paying special attention to the area where your surgery will be performed.  7.  Thoroughly rinse your body with warm water from the neck down.  8.  DO NOT shower/wash with your normal soap after using and rinsing off the CHG Soap.  9.  Pat yourself dry with a clean towel.            10.  Wear clean pajamas.            11.  Place clean sheets on your bed the night of your first shower and do not sleep with pets.  Day of Surgery  Do not apply any lotions/deoderants the morning of surgery.   Please wear clean clothes to the hospital/surgery center. Remember to brush your teeth with toothpaste.     Please read over the following fact sheets that you were given. Pain Booklet, MRSA Information and Surgical Site  Infection Prevention

## 2017-12-24 ENCOUNTER — Encounter (HOSPITAL_COMMUNITY): Admission: RE | Payer: Self-pay | Source: Ambulatory Visit

## 2017-12-24 ENCOUNTER — Telehealth: Payer: Self-pay | Admitting: Family Medicine

## 2017-12-24 ENCOUNTER — Inpatient Hospital Stay (HOSPITAL_COMMUNITY): Admission: RE | Admit: 2017-12-24 | Payer: Medicare Other | Source: Ambulatory Visit | Admitting: Orthopaedic Surgery

## 2017-12-24 DIAGNOSIS — R011 Cardiac murmur, unspecified: Secondary | ICD-10-CM

## 2017-12-24 SURGERY — ANTERIOR CERVICAL DECOMPRESSION/DISCECTOMY FUSION 2 LEVELS
Anesthesia: General

## 2017-12-24 NOTE — Telephone Encounter (Signed)
Please call patient and let her know that we need to get an echocardiogram.  I think they were concerned about her EKG when she went for the preop.  We will get this ordered as soon as possible so it does not delay surgery.

## 2017-12-26 ENCOUNTER — Other Ambulatory Visit: Payer: Self-pay | Admitting: Family Medicine

## 2017-12-30 ENCOUNTER — Ambulatory Visit (HOSPITAL_BASED_OUTPATIENT_CLINIC_OR_DEPARTMENT_OTHER)
Admission: RE | Admit: 2017-12-30 | Discharge: 2017-12-30 | Disposition: A | Discharge status: Home / Self Care | Payer: Medicare Other | Source: Ambulatory Visit | Attending: Family Medicine | Admitting: Family Medicine

## 2017-12-30 DIAGNOSIS — R011 Cardiac murmur, unspecified: Secondary | ICD-10-CM | POA: Diagnosis present

## 2017-12-30 DIAGNOSIS — I080 Rheumatic disorders of both mitral and aortic valves: Secondary | ICD-10-CM | POA: Insufficient documentation

## 2017-12-30 DIAGNOSIS — E785 Hyperlipidemia, unspecified: Secondary | ICD-10-CM | POA: Insufficient documentation

## 2017-12-30 DIAGNOSIS — E119 Type 2 diabetes mellitus without complications: Secondary | ICD-10-CM | POA: Diagnosis not present

## 2017-12-30 DIAGNOSIS — I509 Heart failure, unspecified: Secondary | ICD-10-CM | POA: Insufficient documentation

## 2017-12-30 DIAGNOSIS — I110 Hypertensive heart disease with heart failure: Secondary | ICD-10-CM | POA: Insufficient documentation

## 2017-12-30 NOTE — Progress Notes (Signed)
  Echocardiogram 2D Echocardiogram has been performed.  Aydin Cavalieri T Makalya Nave 12/30/2017, 9:12 AM

## 2017-12-30 NOTE — Progress Notes (Addendum)
Anesthesia follow-up: See my PAT note from 12/23/17 encounter. Patient was scheduled for C5-6, C6-7 ACDF by Annell Greening, MD on 12/24/17, but surgery was delayed due to insurance reasons. In the interim, patient's PCP Agapito Games, MD asked to review EKG 12/23/17 (possible inferior infarct with no available comparison) and consider preoperative echo given patient's history of murmur. She did order an echo asap so not to delay patient's surgery which was rescheduled for 01/03/18. Echo today showed normal LVF, PA peak pressure 33, and only mild valvular regurgitation (see below).  Echo 12/30/17: Study Conclusions - Left ventricle: The cavity size was normal. Wall thickness was   normal. Systolic function was normal. The estimated ejection   fraction was in the range of 60% to 65%. Wall motion was normal;   there were no regional wall motion abnormalities. Doppler   parameters are consistent with abnormal left ventricular   relaxation (grade 1 diastolic dysfunction). - Aortic valve: There was mild regurgitation. Valve area (VTI):   1.86 cm^2. Valve area (Vmax): 1.87 cm^2. Valve area (Vmean): 1.85   cm^2. - Mitral valve: There was mild regurgitation. - Pulmonary arteries: PA peak pressure: 33 mm Hg (S). Impressions: - 1. Left ventricular systolic function is preserved visually   estimated ejection fraction is 60 to 65%. Impaired left   ventricular relaxation.   2. Mild mitral regurgitation   3. Mild aortic regurgitation. (Comparison echo 05/01/11 received from [facility]: LVEF 60-65%, mild concentric LVH, RVSP 30-40 mmHg, consistent with mild pulmonary hypertension, mild 1+ MR.)  Velna Ochs [facility] Short Stay Center/Anesthesiology Phone [phone] 12/30/2017 5:23 PM

## 2017-12-31 ENCOUNTER — Inpatient Hospital Stay (INDEPENDENT_AMBULATORY_CARE_PROVIDER_SITE_OTHER): Payer: Medicare Other | Admitting: Orthopaedic Surgery

## 2018-01-03 ENCOUNTER — Ambulatory Visit (HOSPITAL_COMMUNITY)
Admission: RE | Admit: 2018-01-03 | Discharge: 2018-01-04 | Disposition: A | Discharge status: Home / Self Care | Payer: Medicare Other | Source: Ambulatory Visit | Attending: Orthopaedic Surgery | Admitting: Orthopaedic Surgery

## 2018-01-03 ENCOUNTER — Encounter (HOSPITAL_COMMUNITY)
Admission: RE | Disposition: A | Discharge status: Home / Self Care | Payer: Self-pay | Source: Ambulatory Visit | Attending: Orthopaedic Surgery

## 2018-01-03 ENCOUNTER — Ambulatory Visit (HOSPITAL_COMMUNITY): Payer: Medicare Other

## 2018-01-03 ENCOUNTER — Ambulatory Visit (HOSPITAL_COMMUNITY): Payer: Medicare Other | Admitting: Vascular Surgery

## 2018-01-03 ENCOUNTER — Other Ambulatory Visit: Payer: Self-pay

## 2018-01-03 ENCOUNTER — Encounter (HOSPITAL_COMMUNITY): Payer: Self-pay | Admitting: Surgery

## 2018-01-03 DIAGNOSIS — M4802 Spinal stenosis, cervical region: Secondary | ICD-10-CM | POA: Diagnosis not present

## 2018-01-03 DIAGNOSIS — I10 Essential (primary) hypertension: Secondary | ICD-10-CM | POA: Diagnosis not present

## 2018-01-03 DIAGNOSIS — Z6841 Body Mass Index (BMI) 40.0 and over, adult: Secondary | ICD-10-CM | POA: Diagnosis not present

## 2018-01-03 DIAGNOSIS — Z79890 Hormone replacement therapy (postmenopausal): Secondary | ICD-10-CM | POA: Diagnosis not present

## 2018-01-03 DIAGNOSIS — Z7984 Long term (current) use of oral hypoglycemic drugs: Secondary | ICD-10-CM | POA: Insufficient documentation

## 2018-01-03 DIAGNOSIS — M4722 Other spondylosis with radiculopathy, cervical region: Secondary | ICD-10-CM | POA: Diagnosis not present

## 2018-01-03 DIAGNOSIS — Z96653 Presence of artificial knee joint, bilateral: Secondary | ICD-10-CM | POA: Diagnosis not present

## 2018-01-03 DIAGNOSIS — Z79899 Other long term (current) drug therapy: Secondary | ICD-10-CM | POA: Insufficient documentation

## 2018-01-03 DIAGNOSIS — M069 Rheumatoid arthritis, unspecified: Secondary | ICD-10-CM | POA: Diagnosis not present

## 2018-01-03 DIAGNOSIS — Z96643 Presence of artificial hip joint, bilateral: Secondary | ICD-10-CM | POA: Diagnosis not present

## 2018-01-03 DIAGNOSIS — F419 Anxiety disorder, unspecified: Secondary | ICD-10-CM | POA: Diagnosis not present

## 2018-01-03 DIAGNOSIS — E039 Hypothyroidism, unspecified: Secondary | ICD-10-CM | POA: Diagnosis not present

## 2018-01-03 DIAGNOSIS — E1151 Type 2 diabetes mellitus with diabetic peripheral angiopathy without gangrene: Secondary | ICD-10-CM | POA: Insufficient documentation

## 2018-01-03 DIAGNOSIS — Z79891 Long term (current) use of opiate analgesic: Secondary | ICD-10-CM | POA: Insufficient documentation

## 2018-01-03 DIAGNOSIS — F329 Major depressive disorder, single episode, unspecified: Secondary | ICD-10-CM | POA: Diagnosis not present

## 2018-01-03 DIAGNOSIS — Z419 Encounter for procedure for purposes other than remedying health state, unspecified: Secondary | ICD-10-CM

## 2018-01-03 HISTORY — PX: ANTERIOR CERVICAL DECOMP/DISCECTOMY FUSION: SHX1161

## 2018-01-03 LAB — GLUCOSE, CAPILLARY: Glucose-Capillary: 184 mg/dL — ABNORMAL HIGH (ref 70–99)

## 2018-01-03 SURGERY — ANTERIOR CERVICAL DECOMPRESSION/DISCECTOMY FUSION 2 LEVELS
Anesthesia: General

## 2018-01-03 MED ORDER — SODIUM CHLORIDE 0.9% FLUSH
3.0000 mL | Freq: Two times a day (BID) | INTRAVENOUS | Status: DC
  Administered 2018-01-03: 3 mL via INTRAVENOUS

## 2018-01-03 MED ORDER — ROCURONIUM BROMIDE 50 MG/5ML IV SOSY
PREFILLED_SYRINGE | INTRAVENOUS | Status: AC
  Filled 2018-01-03: qty 5

## 2018-01-03 MED ORDER — SODIUM CHLORIDE 0.9 % IV SOLN
INTRAVENOUS | Status: DC | PRN
  Administered 2018-01-03: 25 ug/min via INTRAVENOUS

## 2018-01-03 MED ORDER — CHLORHEXIDINE GLUCONATE 4 % EX LIQD: 60.0000 mL | Freq: Once | CUTANEOUS | Status: DC

## 2018-01-03 MED ORDER — FUROSEMIDE 20 MG PO TABS: 20.0000 mg | ORAL_TABLET | Freq: Every day | ORAL | Status: DC | PRN

## 2018-01-03 MED ORDER — FENTANYL CITRATE (PF) 250 MCG/5ML IJ SOLN
INTRAMUSCULAR | Status: DC | PRN
  Administered 2018-01-03 (×3): 50 ug via INTRAVENOUS
  Administered 2018-01-03: 100 ug via INTRAVENOUS

## 2018-01-03 MED ORDER — METFORMIN HCL 500 MG PO TABS
500.0000 mg | ORAL_TABLET | Freq: Every day | ORAL | Status: DC
  Administered 2018-01-04: 500 mg via ORAL
  Filled 2018-01-03: qty 1

## 2018-01-03 MED ORDER — HEMOSTATIC AGENTS (NO CHARGE) OPTIME
TOPICAL | Status: DC | PRN
  Administered 2018-01-03 (×2): 1 via TOPICAL

## 2018-01-03 MED ORDER — ONDANSETRON HCL 4 MG/2ML IJ SOLN
INTRAMUSCULAR | Status: AC
  Filled 2018-01-03: qty 2

## 2018-01-03 MED ORDER — BUPIVACAINE-EPINEPHRINE (PF) 0.25% -1:200000 IJ SOLN
INTRAMUSCULAR | Status: AC
  Filled 2018-01-03: qty 30

## 2018-01-03 MED ORDER — LACTATED RINGERS IV SOLN
INTRAVENOUS | Status: DC | PRN
  Administered 2018-01-03 (×2): via INTRAVENOUS

## 2018-01-03 MED ORDER — METHOTREXATE SODIUM CHEMO INJECTION 50 MG/2ML: 25.0000 mg | INTRAMUSCULAR | Status: DC

## 2018-01-03 MED ORDER — ONDANSETRON HCL 4 MG PO TABS: 4.0000 mg | ORAL_TABLET | Freq: Four times a day (QID) | ORAL | Status: DC | PRN

## 2018-01-03 MED ORDER — ACETAMINOPHEN 325 MG PO TABS: 650.0000 mg | ORAL_TABLET | ORAL | Status: DC | PRN

## 2018-01-03 MED ORDER — HYDROMORPHONE HCL 1 MG/ML IJ SOLN: 0.5000 mg | INTRAMUSCULAR | Status: DC | PRN

## 2018-01-03 MED ORDER — FENTANYL CITRATE (PF) 250 MCG/5ML IJ SOLN
INTRAMUSCULAR | Status: AC
  Filled 2018-01-03: qty 5

## 2018-01-03 MED ORDER — ONDANSETRON HCL 4 MG/2ML IJ SOLN
INTRAMUSCULAR | Status: DC | PRN
  Administered 2018-01-03: 4 mg via INTRAVENOUS

## 2018-01-03 MED ORDER — INSULIN ASPART 100 UNIT/ML ~~LOC~~ SOLN: 0.0000 [IU] | Freq: Three times a day (TID) | SUBCUTANEOUS | Status: DC

## 2018-01-03 MED ORDER — AZELAIC ACID 20 % EX CREA: 1.0000 "application " | TOPICAL_CREAM | Freq: Two times a day (BID) | CUTANEOUS | Status: DC | PRN

## 2018-01-03 MED ORDER — OXYCODONE HCL 5 MG PO TABS
ORAL_TABLET | ORAL | Status: AC
  Filled 2018-01-03: qty 1

## 2018-01-03 MED ORDER — SODIUM CHLORIDE 0.9 % IV SOLN: INTRAVENOUS | Status: DC

## 2018-01-03 MED ORDER — POLYETHYLENE GLYCOL 3350 17 G PO PACK
17.0000 g | PACK | Freq: Every day | ORAL | Status: DC
  Administered 2018-01-04: 17 g via ORAL
  Filled 2018-01-03: qty 1

## 2018-01-03 MED ORDER — CEFAZOLIN SODIUM-DEXTROSE 2-4 GM/100ML-% IV SOLN
INTRAVENOUS | Status: AC
  Filled 2018-01-03: qty 100

## 2018-01-03 MED ORDER — ACETAMINOPHEN 650 MG RE SUPP: 650.0000 mg | RECTAL | Status: DC | PRN

## 2018-01-03 MED ORDER — OXYCODONE HCL 5 MG PO TABS
5.0000 mg | ORAL_TABLET | ORAL | Status: DC | PRN
  Administered 2018-01-03 – 2018-01-04 (×4): 5 mg via ORAL
  Filled 2018-01-03 (×3): qty 1

## 2018-01-03 MED ORDER — ONDANSETRON HCL 4 MG/2ML IJ SOLN: 4.0000 mg | Freq: Four times a day (QID) | INTRAMUSCULAR | Status: DC | PRN

## 2018-01-03 MED ORDER — PROPOFOL 10 MG/ML IV BOLUS
INTRAVENOUS | Status: AC
  Filled 2018-01-03: qty 20

## 2018-01-03 MED ORDER — DOCUSATE SODIUM 100 MG PO CAPS
100.0000 mg | ORAL_CAPSULE | Freq: Two times a day (BID) | ORAL | Status: DC
  Administered 2018-01-03 – 2018-01-04 (×2): 100 mg via ORAL
  Filled 2018-01-03 (×2): qty 1

## 2018-01-03 MED ORDER — HYDROXYCHLOROQUINE SULFATE 200 MG PO TABS
400.0000 mg | ORAL_TABLET | Freq: Every day | ORAL | Status: DC
  Administered 2018-01-04: 400 mg via ORAL
  Filled 2018-01-03: qty 2

## 2018-01-03 MED ORDER — FOLIC ACID 1 MG PO TABS
1.0000 mg | ORAL_TABLET | Freq: Every day | ORAL | Status: DC
  Administered 2018-01-03 – 2018-01-04 (×2): 1 mg via ORAL
  Filled 2018-01-03 (×2): qty 1

## 2018-01-03 MED ORDER — LISINOPRIL 20 MG PO TABS
20.0000 mg | ORAL_TABLET | Freq: Every day | ORAL | Status: DC
  Administered 2018-01-03 – 2018-01-04 (×2): 20 mg via ORAL
  Filled 2018-01-03 (×2): qty 1

## 2018-01-03 MED ORDER — DULOXETINE HCL 30 MG PO CPEP
60.0000 mg | ORAL_CAPSULE | Freq: Every day | ORAL | Status: DC
  Administered 2018-01-04: 60 mg via ORAL
  Filled 2018-01-03: qty 2

## 2018-01-03 MED ORDER — LIDOCAINE 2% (20 MG/ML) 5 ML SYRINGE
INTRAMUSCULAR | Status: DC | PRN
  Administered 2018-01-03: 60 mg via INTRAVENOUS

## 2018-01-03 MED ORDER — BUPIVACAINE-EPINEPHRINE 0.25% -1:200000 IJ SOLN
INTRAMUSCULAR | Status: DC | PRN
  Administered 2018-01-03: 10 mL

## 2018-01-03 MED ORDER — PROPOFOL 10 MG/ML IV BOLUS
INTRAVENOUS | Status: DC | PRN
  Administered 2018-01-03: 150 mg via INTRAVENOUS

## 2018-01-03 MED ORDER — 0.9 % SODIUM CHLORIDE (POUR BTL) OPTIME
TOPICAL | Status: DC | PRN
  Administered 2018-01-03: 1000 mL

## 2018-01-03 MED ORDER — CEFAZOLIN SODIUM-DEXTROSE 2-4 GM/100ML-% IV SOLN
2.0000 g | INTRAVENOUS | Status: AC
  Administered 2018-01-03: 2 g via INTRAVENOUS

## 2018-01-03 MED ORDER — LISINOPRIL-HYDROCHLOROTHIAZIDE 20-25 MG PO TABS: 1.0000 | ORAL_TABLET | Freq: Every day | ORAL | Status: DC

## 2018-01-03 MED ORDER — POTASSIUM CHLORIDE ER 10 MEQ PO TBCR
10.0000 meq | EXTENDED_RELEASE_TABLET | Freq: Two times a day (BID) | ORAL | Status: DC
  Administered 2018-01-03 – 2018-01-04 (×2): 10 meq via ORAL
  Filled 2018-01-03 (×4): qty 1

## 2018-01-03 MED ORDER — SODIUM CHLORIDE 0.9% FLUSH: 3.0000 mL | INTRAVENOUS | Status: DC | PRN

## 2018-01-03 MED ORDER — LACTATED RINGERS IV SOLN: INTRAVENOUS | Status: DC

## 2018-01-03 MED ORDER — PHENOL 1.4 % MT LIQD
1.0000 | OROMUCOSAL | Status: DC | PRN
  Administered 2018-01-04: 1 via OROMUCOSAL
  Filled 2018-01-03: qty 177

## 2018-01-03 MED ORDER — SUGAMMADEX SODIUM 200 MG/2ML IV SOLN
INTRAVENOUS | Status: DC | PRN
  Administered 2018-01-03: 200 mg via INTRAVENOUS

## 2018-01-03 MED ORDER — DEXAMETHASONE SODIUM PHOSPHATE 10 MG/ML IJ SOLN
INTRAMUSCULAR | Status: DC | PRN
  Administered 2018-01-03: 10 mg via INTRAVENOUS

## 2018-01-03 MED ORDER — ROCURONIUM BROMIDE 10 MG/ML (PF) SYRINGE
PREFILLED_SYRINGE | INTRAVENOUS | Status: DC | PRN
  Administered 2018-01-03: 50 mg via INTRAVENOUS
  Administered 2018-01-03 (×2): 10 mg via INTRAVENOUS

## 2018-01-03 MED ORDER — LEVOTHYROXINE SODIUM 25 MCG PO TABS
25.0000 ug | ORAL_TABLET | Freq: Every day | ORAL | Status: DC
  Administered 2018-01-04: 25 ug via ORAL
  Filled 2018-01-03: qty 1

## 2018-01-03 MED ORDER — FENTANYL CITRATE (PF) 100 MCG/2ML IJ SOLN
INTRAMUSCULAR | Status: AC
  Filled 2018-01-03: qty 2

## 2018-01-03 MED ORDER — HYDROCHLOROTHIAZIDE 25 MG PO TABS
25.0000 mg | ORAL_TABLET | Freq: Every day | ORAL | Status: DC
  Administered 2018-01-03 – 2018-01-04 (×2): 25 mg via ORAL
  Filled 2018-01-03 (×2): qty 1

## 2018-01-03 MED ORDER — LIDOCAINE 2% (20 MG/ML) 5 ML SYRINGE
INTRAMUSCULAR | Status: AC
  Filled 2018-01-03: qty 5

## 2018-01-03 MED ORDER — CEFAZOLIN SODIUM-DEXTROSE 1-4 GM/50ML-% IV SOLN
1.0000 g | Freq: Three times a day (TID) | INTRAVENOUS | Status: AC
  Administered 2018-01-03 – 2018-01-04 (×2): 1 g via INTRAVENOUS
  Filled 2018-01-03 (×2): qty 50

## 2018-01-03 MED ORDER — DEXAMETHASONE SODIUM PHOSPHATE 10 MG/ML IJ SOLN
INTRAMUSCULAR | Status: AC
  Filled 2018-01-03: qty 1

## 2018-01-03 MED ORDER — MENTHOL 3 MG MT LOZG: 1.0000 | LOZENGE | OROMUCOSAL | Status: DC | PRN

## 2018-01-03 MED ORDER — ONDANSETRON HCL 4 MG/2ML IJ SOLN: 4.0000 mg | Freq: Once | INTRAMUSCULAR | Status: DC | PRN

## 2018-01-03 MED ORDER — FENTANYL CITRATE (PF) 100 MCG/2ML IJ SOLN
25.0000 ug | INTRAMUSCULAR | Status: DC | PRN
  Administered 2018-01-03 (×2): 25 ug via INTRAVENOUS

## 2018-01-03 SURGICAL SUPPLY — 57 items
AGENT HMST KT MTR STRL THRMB (HEMOSTASIS) ×1
APL SKNCLS STERI-STRIP NONHPOA (GAUZE/BANDAGES/DRESSINGS) ×1
BENZOIN TINCTURE PRP APPL 2/3 (GAUZE/BANDAGES/DRESSINGS) ×2 IMPLANT
BIT DRILL SKYLINE 12MM (BIT) IMPLANT
BLADE CLIPPER SURG (BLADE) IMPLANT
BONE CERV LORDOTIC 14.5X12X6 (Bone Implant) ×2 IMPLANT
BONE CERV LORDOTIC 14.5X12X8 (Bone Implant) ×2 IMPLANT
BUR ROUND FLUTED 4 SOFT TCH (BURR) IMPLANT
CLSR STERI-STRIP ANTIMIC 1/2X4 (GAUZE/BANDAGES/DRESSINGS) ×1 IMPLANT
COLLAR CERV LO CONTOUR FIRM DE (SOFTGOODS) IMPLANT
CORD BIPOLAR FORCEPS 12FT (ELECTRODE) ×2 IMPLANT
COVER SURGICAL LIGHT HANDLE (MISCELLANEOUS) ×2 IMPLANT
CRADLE DONUT ADULT HEAD (MISCELLANEOUS) ×2 IMPLANT
DRAPE C-ARM 42X72 X-RAY (DRAPES) ×2 IMPLANT
DRAPE HALF SHEET 40X57 (DRAPES) ×2 IMPLANT
DRAPE MICROSCOPE LEICA (MISCELLANEOUS) ×2 IMPLANT
DRILL BIT SKYLINE 12MM (BIT) ×2
DURAPREP 6ML APPLICATOR 50/CS (WOUND CARE) ×2 IMPLANT
ELECT COATED BLADE 2.86 ST (ELECTRODE) ×2 IMPLANT
ELECT REM PT RETURN 9FT ADLT (ELECTROSURGICAL) ×2
ELECTRODE REM PT RTRN 9FT ADLT (ELECTROSURGICAL) ×1 IMPLANT
EVACUATOR 1/8 PVC DRAIN (DRAIN) ×2 IMPLANT
GAUZE SPONGE 4X4 12PLY STRL (GAUZE/BANDAGES/DRESSINGS) ×2 IMPLANT
GAUZE SPONGE 4X4 12PLY STRL LF (GAUZE/BANDAGES/DRESSINGS) ×1 IMPLANT
GLOVE BIOGEL PI IND STRL 8 (GLOVE) ×2 IMPLANT
GLOVE BIOGEL PI INDICATOR 8 (GLOVE) ×2
GLOVE ORTHO TXT STRL SZ7.5 (GLOVE) ×4 IMPLANT
GOWN STRL REUS W/ TWL LRG LVL3 (GOWN DISPOSABLE) ×1 IMPLANT
GOWN STRL REUS W/ TWL XL LVL3 (GOWN DISPOSABLE) ×1 IMPLANT
GOWN STRL REUS W/TWL 2XL LVL3 (GOWN DISPOSABLE) ×2 IMPLANT
GOWN STRL REUS W/TWL LRG LVL3 (GOWN DISPOSABLE) ×2
GOWN STRL REUS W/TWL XL LVL3 (GOWN DISPOSABLE) ×2
GRAFT BNE SPCR VG2 14.5X12X6 (Bone Implant) IMPLANT
GRAFT BNE SPCR VG2 14.5X12X8 (Bone Implant) IMPLANT
HEAD HALTER (SOFTGOODS) ×2 IMPLANT
HEMOSTAT SURGICEL 2X14 (HEMOSTASIS) IMPLANT
KIT BASIN OR (CUSTOM PROCEDURE TRAY) ×2 IMPLANT
KIT TURNOVER KIT B (KITS) ×2 IMPLANT
MANIFOLD NEPTUNE II (INSTRUMENTS) IMPLANT
NDL 25GX 5/8IN NON SAFETY (NEEDLE) ×1 IMPLANT
NEEDLE 25GX 5/8IN NON SAFETY (NEEDLE) ×2 IMPLANT
NS IRRIG 1000ML POUR BTL (IV SOLUTION) ×2 IMPLANT
PACK ORTHO CERVICAL (CUSTOM PROCEDURE TRAY) ×2 IMPLANT
PAD ARMBOARD 7.5X6 YLW CONV (MISCELLANEOUS) ×4 IMPLANT
PATTIES SURGICAL .5 X.5 (GAUZE/BANDAGES/DRESSINGS) IMPLANT
PIN TEMP SKYLINE THREADED (PIN) ×1 IMPLANT
PLATE TWO LEVEL SKYLINE 30MM (Plate) ×1 IMPLANT
RESTRAINT LIMB HOLDER UNIV (RESTRAINTS) IMPLANT
SCREW VARIABLE SELF TAP 12MM (Screw) ×6 IMPLANT
STRIP CLOSURE SKIN 1/2X4 (GAUZE/BANDAGES/DRESSINGS) ×2 IMPLANT
SURGIFLO W/THROMBIN 8M KIT (HEMOSTASIS) ×1 IMPLANT
SUT BONE WAX W31G (SUTURE) ×2 IMPLANT
SUT VIC AB 3-0 X1 27 (SUTURE) ×2 IMPLANT
SUT VICRYL 4-0 PS2 18IN ABS (SUTURE) ×4 IMPLANT
TOWEL OR 17X24 6PK STRL BLUE (TOWEL DISPOSABLE) ×2 IMPLANT
TOWEL OR 17X26 10 PK STRL BLUE (TOWEL DISPOSABLE) ×2 IMPLANT
TRAY FOLEY CATH SILVER 16FR (SET/KITS/TRAYS/PACK) IMPLANT

## 2018-01-03 NOTE — Anesthesia Postprocedure Evaluation (Signed)
Anesthesia Post Note  Patient: GWYNN CHALKER  Procedure(s) Performed: ANTERIOR CERVICAL DECOMPRESSION/DISCECTOMY FUSION, ALLOGRAFT, PLATE (N/A )     Patient location during evaluation: PACU Anesthesia Type: General Level of consciousness: awake and alert Pain management: pain level controlled Vital Signs Assessment: post-procedure vital signs reviewed and stable Respiratory status: spontaneous breathing, nonlabored ventilation and respiratory function stable Cardiovascular status: blood pressure returned to baseline and stable Postop Assessment: no apparent nausea or vomiting Anesthetic complications: no    Last Vitals:  Vitals:   01/03/18 1520 01/03/18 1530  BP:  (!) 141/56  Pulse: 88 78  Resp: 19 (!) 22  Temp:    SpO2: 100% 96%                 Beryle Lathe

## 2018-01-03 NOTE — Op Note (Signed)
Preop diagnosis C5-6, C6-7 cervical spondylosis, rheumatoid arthritis and spinal stenosis with foraminal stenosis.  Postoperative diagnosis: Same  Procedure: C5-6, C6-7 anterior cervical discectomy and fusion allograft and plate.  Surgeon Annell Greening, MD  Assistant Zonia Kief, PA-C medically necessary and present for the entire procedure  Anesthesia General  Implants:Depuy skyline 30 mm plate 12 mm screws x6.VG2 cortical cancellous allograft 7 mm at C5-6 and 8 mm at C6-7.  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  Wrist restraints were pulled out for see her visualization.  Neck was prepped with DuraPrep there scored towels.  To the patient's Betadine allergy a clear plastic Steri-Drape was applied sterile female standard the head thyroid sheets and drapes.  Ancef was given prophylactically.  Incision started to midline extending to the left platysma was divided in line with the skin incision.  Blunt dissection down the longus Coley with her prominent spurs at C5-6 C6-7.  Needle was placed at C4-5 for adequate visualization 25 short needle on a straight clamp.  C5-6 disc was then marked taking a chunk of disc out with the 15 scalpel blade and pituitary.  Self-retaining retractors were placed right and left teeth retractors underneath the longus Coley smooth plate cephalad caudad.  Discectomy was performed anterior spurs were removed progression back to the posterior longitudinal ligament was performed where there was large overhanging spurs on the left side causing cord compression.  Less prominent spurs on the right side.  Posterior longitudinal ligament was taken down uncovertebral joints were stripped with a Cloward curettes.  With adequate  decompression of the dura trial sizers first with a 6 and then 7 mm graft gave good fit.  So millimeter graft was marked anteriorly placed with CRNA pulling traction countersunk 2 mm secure.  There is room right and left for egress of fluid.  Identical procedure was repeated at C6-7 level.  A millimeter graft was placed at this level.  Again there were large prominent spurs causing stenosis left hemicord with only mild changes on the right.  Dura was decompressed uncovertebral joint stripped.  30 mm skyline plate was selected checked under C arm screws were placed final spot pictures were taken screws were all locked in Hemovac was placed and did not technique using the trocar in line with skin incision on the left platysma closed 3-0 Vicryl 4-0 Vicryl subcuticular closure tincture benzoin Steri-Strips 4 x 4's tape and collar with a collar extender due to the patient's body habitus.  Patient tolerated procedure well was transferred to recovery room stable condition.

## 2018-01-03 NOTE — Progress Notes (Signed)
Report given to melissa rn as caregiver 

## 2018-01-03 NOTE — Transfer of Care (Signed)
Immediate Anesthesia Transfer of Care Note  Patient: Joann Maddox  Procedure(s) Performed: ANTERIOR CERVICAL DECOMPRESSION/DISCECTOMY FUSION, ALLOGRAFT, PLATE (N/A )  Patient Location: PACU  Anesthesia Type:General  Level of Consciousness: awake and alert   Airway & Oxygen Therapy: Patient Spontanous Breathing and Patient connected to nasal cannula oxygen  Post-op Assessment: Report given to RN, Post -op Vital signs reviewed and stable and Patient moving all extremities X 4  Post vital signs: Reviewed and stable  Last Vitals:  Vitals Value Taken Time  BP    Temp    Pulse 92 01/03/2018  2:29 PM  Resp 23 01/03/2018  2:29 PM  SpO2 93 % 01/03/2018  2:29 PM  Vitals shown include unvalidated device data.  Last Pain:  Vitals:   01/03/18 0950  TempSrc:   PainSc: 8       Patients Stated Pain Goal: 3 (01/03/18 0950)  Complications: No apparent anesthesia complications

## 2018-01-03 NOTE — H&P (Signed)
Joann Maddox is an 77 y.o. female.   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  Progressively worsening symptoms.  Failed conservative treatment.  Past Medical History:  Diagnosis Date  . Anxiety   . Arthritis   . Depression   . Headache(784.0)    migrains  . Heart murmur   . Hypothyroidism   . Pre-diabetes   . Rheumatoid arthritis(714.0)     Past Surgical History:  Procedure Laterality Date  . ABDOMINAL HYSTERECTOMY    . APPENDECTOMY    . CHOLECYSTECTOMY    . REPLACEMENT TOTAL KNEE BILATERAL    . TONSILLECTOMY     as  a child  . TOTAL HIP ARTHROPLASTY     right and left    Family History  Problem Relation Age of Onset  . Heart disease Father 45  . Diabetes Unknown        aunt   . Stroke Mother 54   Social History:  reports that she has never smoked. She has never used smokeless tobacco. She reports that she does not drink alcohol. Her drug history is not on file.  Allergies:  Allergies  Allergen Reactions  . Iodine Other (See Comments)    Shook violently and passed out.  . Codeine Other (See Comments)    Hallucinations  . Lipitor [Atorvastatin] Other (See Comments)    Myalgias    Medications Prior to Admission  Medication Sig Dispense Refill  . AMBULATORY NON FORMULARY MEDICATION Medication Name: Contuniuous Positive Airway Pressure (CPAP) machine set at autopap, with all supplemental supplies as needed. 1 each 0  . butalbital-acetaminophen-caffeine (FIORICET, ESGIC) 50-325-40 MG tablet Take 1 tablet by mouth 2 (two) times daily as needed. (Patient taking differently: Take 1 tablet by mouth 2 (two) times daily as needed for migraine. ) 14 tablet 5  . DULoxetine (CYMBALTA) 60 MG capsule Take 1 capsule (60 mg total) by mouth daily. 90 capsule 2  . folic acid (FOLVITE) 1 MG tablet TAKE 1 TABLET BY MOUTH EVERY DAY (Patient taking  differently: Take 1 mg by mouth daily. ) 90 tablet 2  . furosemide (LASIX) 20 MG tablet TAKE 1 TABLET BY MOUTH EVERY DAY AS NEEDED (Patient taking differently: Take 20 mg by mouth daily as needed for fluid. ) 90 tablet 1  . hydroxychloroquine (PLAQUENIL) 200 MG tablet 2 TABLET WITH FOOD OR MILK ONCE A DAY ORALLY (Patient taking differently: Take 400 mg by mouth daily with breakfast. ) 60 tablet 1  . KLOR-CON 10 10 MEQ tablet Take 10 mEq by mouth 2 (two) times daily.   3  . levothyroxine (SYNTHROID, LEVOTHROID) 25 MCG tablet Take 1 tablet (25 mcg total) by mouth daily. 90 tablet 3  . lisinopril (PRINIVIL,ZESTRIL) 10 MG tablet Take 1 tablet (10 mg total) by mouth daily. 90 tablet 1  . lisinopril-hydrochlorothiazide (PRINZIDE,ZESTORETIC) 20-25 MG tablet Take 1 tablet by mouth daily.    . meloxicam (MOBIC) 15 MG tablet TAKE ONE TABLET BY MOUTH EACH AM WITH BREAKFAST AS NEEDED FOR PAIN (Patient taking differently: Take 15 mg by mouth daily. ) 30 tablet 2  . metFORMIN (GLUCOPHAGE) 500 MG tablet TAKE 1 TABLET BY MOUTH 2 TIMES DAILY WITH A MEAL. (Patient taking differently: Take 500 mg by mouth daily. ) 180 tablet 1  . methotrexate 50 MG/2ML injection Inject 25 mg into the muscle every Saturday.   3  . Multiple  Vitamin (MULTIVITAMIN) tablet Take 1 tablet by mouth daily.    Marland Kitchen MYRBETRIQ 25 MG TB24 tablet TAKE 1 TABLET BY MOUTH EVERY DAY (Patient taking differently: Take 25 mg by mouth at bedtime. ) 30 tablet 3  . nystatin (NYSTATIN) powder Apply 1 g topically 2 (two) times daily. X 3 weeks. (Patient taking differently: Apply 1 g topically 2 (two) times daily as needed (yeast/skin rash). ) 60 g 6  . Omega-3 Fatty Acids (FISH OIL) 1200 MG CAPS Take 1,200 mg by mouth 2 (two) times a week. WITH OMEGA-3 360 MG    . rosuvastatin (CRESTOR) 10 MG tablet TAKE 1 TABLET (10 MG TOTAL) BY MOUTH DAILY. (Patient taking differently: Take 10 mg by mouth at bedtime. ) 30 tablet 11  . senna (SENOKOT) 8.6 MG tablet Take 1 tablet  by mouth at bedtime.     . traMADol (ULTRAM) 50 MG tablet TAKE 1-2 TABS BY MOUTH EVERY 12 HOURS AS NEEDED FOR PAIN (Patient taking differently: Take 50 mg by mouth every 12 (twelve) hours as needed (for pain.). ) 120 tablet 1  . TURMERIC PO Take 1,000 mg by mouth daily.    . vitamin B-12 (CYANOCOBALAMIN) 100 MCG tablet Take 100 mcg by mouth at bedtime.     . Vitamin D, Ergocalciferol, (DRISDOL) 50000 units CAPS capsule TAKE 1 CAPSULE (50,000 UNITS TOTAL) BY MOUTH ONCE A WEEK. (Patient taking differently: Take 50,000 Units by mouth every Sunday. ) 12 capsule 2  . AZELEX 20 % cream APPLY TOPICALL 2 TIMES A DAY (MORNING AND EVENING). APPLY AFTER SKIN IS WASHED AND PATTED DRY. (Patient taking differently: Apply 1 application topically 2 (two) times daily as needed (skin irritation.). ) 30 g 1    No results found for this or any previous visit (from the past 48 hour(s)). No results found.  Review of Systems  Constitutional: Negative.   HENT: Negative.   Eyes: Negative.   Respiratory: Negative.   Gastrointestinal: Negative.   Genitourinary: Negative.   Musculoskeletal: Positive for neck pain.  Skin: Negative.   Neurological: Positive for tingling.  Psychiatric/Behavioral: Negative.     Blood pressure (!) 148/56, pulse 72, temperature 98 F (36.7 C), temperature source Oral, resp. rate 20, weight 93.9 kg, SpO2 100 %. Physical Exam  Constitutional: She is oriented to person, place, and time. She appears well-developed. No distress.  HENT:  Head: Normocephalic and atraumatic.  Eyes: Pupils are equal, round, and reactive to light. EOM are normal.  Respiratory: Effort normal. No respiratory distress. She has wheezes.  GI: She exhibits no distension. There is no tenderness.  Musculoskeletal: She exhibits tenderness.  Neurological: She is alert and oriented to person, place, and time.  Psychiatric: She has a normal mood and affect.     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  Started procedure along with possible rehab/recovery time discussed.  All questions answered wishes to proceed.  Zonia Kief, PA-C 01/03/2018, 11:38 AM

## 2018-01-03 NOTE — Plan of Care (Signed)
  Problem: Safety: Goal: Ability to remain free from injury will improve Outcome: Progressing   

## 2018-01-03 NOTE — Interval H&P Note (Signed)
History and Physical Interval Note:  01/03/2018 11:27 AM  Joann Maddox  has presented today for surgery, with the diagnosis of C5-6, C6-7 SPONDYLOSIS, LEFT FORAMINAL STENOSIS  The various methods of treatment have been discussed with the patient and family. After consideration of risks, benefits and other options for treatment, the patient has consented to  Procedure(s): ANTERIOR CERVICAL DECOMPRESSION/DISCECTOMY FUSION, ALLOGRAFT, PLATE (N/A) as a surgical intervention .  The patient's history has been reviewed, patient examined, no change in status, stable for surgery.  I have reviewed the patient's chart and labs.  Questions were answered to the patient's satisfaction.     Eldred Manges

## 2018-01-03 NOTE — Anesthesia Procedure Notes (Signed)
Procedure Name: Intubation Date/Time: 01/03/2018 11:58 AM Performed by: Nils Pyle, CRNA Pre-anesthesia Checklist: Patient identified, Emergency Drugs available, Suction available and Patient being monitored Patient Re-evaluated:Patient Re-evaluated prior to induction Oxygen Delivery Method: Circle System Utilized Preoxygenation: Pre-oxygenation with 100% oxygen Induction Type: IV induction Ventilation: Mask ventilation without difficulty Laryngoscope Size: Glidescope and 3 Grade View: Grade I Tube type: Oral Tube size: 7.0 mm Number of attempts: 1 Airway Equipment and Method: Stylet and Oral airway Placement Confirmation: ETT inserted through vocal cords under direct vision,  positive ETCO2 and breath sounds checked- equal and bilateral Secured at: 21 cm Tube secured with: Tape Dental Injury: Teeth and Oropharynx as per pre-operative assessment

## 2018-01-03 NOTE — Anesthesia Preprocedure Evaluation (Addendum)
Anesthesia Evaluation  Patient identified by MRN, date of birth, ID band Patient awake    Reviewed: Allergy & Precautions, NPO status , Patient's Chart, lab work & pertinent test results  History of Anesthesia Complications Negative for: history of anesthetic complications  Airway Mallampati: II  TM Distance: >3 FB Neck ROM: Limited    Dental  (+) Dental Advisory Given, Teeth Intact   Pulmonary neg pulmonary ROS,    breath sounds clear to auscultation       Cardiovascular hypertension, Pt. on medications + Peripheral Vascular Disease  + Valvular Problems/Murmurs AI and MR  Rhythm:Regular Rate:Normal + Systolic murmurs  '19 TTE - EF 60% to 65%. Grade 1 diastolic dysfunction. Mild AI, MR. PASP 33 mmHg    Neuro/Psych  Headaches, Anxiety Depression    GI/Hepatic negative GI ROS, Neg liver ROS,   Endo/Other  diabetes, Type 2, Oral Hypoglycemic AgentsHypothyroidism Morbid obesity  Renal/GU negative Renal ROS  negative genitourinary   Musculoskeletal  (+) Arthritis , Rheumatoid disorders,    Abdominal (+) + obese,   Peds  Hematology negative hematology ROS (+)   Anesthesia Other Findings   Reproductive/Obstetrics                            Anesthesia Physical Anesthesia Plan  ASA: III  Anesthesia Plan: General   Post-op Pain Management:    Induction: Intravenous  PONV Risk Score and Plan: 4 or greater and Treatment may vary due to age or medical condition, Ondansetron and Dexamethasone  Airway Management Planned: Oral ETT and Video Laryngoscope Planned  Additional Equipment: None  Intra-op Plan:   Post-operative Plan: Extubation in OR  Informed Consent: I have reviewed the patients History and Physical, chart, labs and discussed the procedure including the risks, benefits and alternatives for the proposed anesthesia with the patient or authorized representative who has indicated  his/her understanding and acceptance.   Dental advisory given  Plan Discussed with: CRNA and Anesthesiologist  Anesthesia Plan Comments:        Anesthesia Quick Evaluation

## 2018-01-04 DIAGNOSIS — M4722 Other spondylosis with radiculopathy, cervical region: Secondary | ICD-10-CM | POA: Diagnosis not present

## 2018-01-04 LAB — GLUCOSE, CAPILLARY: Glucose-Capillary: 118 mg/dL — ABNORMAL HIGH (ref 70–99)

## 2018-01-04 LAB — BASIC METABOLIC PANEL
Anion gap: 10 (ref 5–15)
BUN: 11 mg/dL (ref 8–23)
CO2: 26 mmol/L (ref 22–32)
Calcium: 9.4 mg/dL (ref 8.9–10.3)
Chloride: 104 mmol/L (ref 98–111)
Creatinine, Ser: 0.69 mg/dL (ref 0.44–1.00)
GFR calc Af Amer: >60 mL/min (ref >60)
GFR calc non Af Amer: >60 mL/min (ref >60)
Glucose, Bld: 135 mg/dL — ABNORMAL HIGH (ref 70–99)
Potassium: 4 mmol/L (ref 3.5–5.1)
Sodium: 140 mmol/L (ref 135–145)

## 2018-01-04 MED ORDER — OXYCODONE-ACETAMINOPHEN 5-325 MG PO TABS: 1.0000 | ORAL_TABLET | ORAL | 0 refills | Status: DC | PRN

## 2018-01-04 NOTE — Progress Notes (Signed)
   Subjective: 1 Day Post-Op Procedure(s) (LRB): ANTERIOR CERVICAL DECOMPRESSION/DISCECTOMY FUSION, ALLOGRAFT, PLATE (N/A) Patient reports pain as mild.    Objective: Vital signs in last 24 hours: Temp:  [97.7 F (36.5 C)-98.5 F (36.9 C)] 97.7 F (36.5 C) (09/10 0738) Pulse Rate:  [67-94] 67 (09/10 0738) Resp:  [18-25] 19 (09/10 0738) BP: (125-145)/(48-87) 132/63 (09/10 0738) SpO2:  [91 %-100 %] 97 % (09/10 0738) Weight:  [93.9 kg] 93.9 kg (09/09 1746)  Intake/Output from previous day: 09/09 0701 - 09/10 0700 In: 1303 [I.V.:1303] Out: 195 [Drains:45; Blood:150] Intake/Output this shift: No intake/output data recorded.  No results for input(s): HGB in the last 72 hours. No results for input(s): WBC, RBC, HCT, PLT in the last 72 hours. Recent Labs    01/04/18 0649  NA 140  K 4.0  CL 104  CO2 26  BUN 11  CREATININE 0.69  GLUCOSE 135*  CALCIUM 9.4   No results for input(s): LABPT, INR in the last 72 hours.  Neurologically intact Dg Cervical Spine 2-3 Views  Result Date: 01/03/2018 CLINICAL DATA:  Cervical disc disease. EXAM: CERVICAL SPINE - 2-3 VIEW; DG C-ARM 61-120 MIN COMPARISON:  MRI dated 11/25/2017 FINDINGS: AP and lateral C-arm images demonstrate the patient has undergone anterior cervical fusion at C5-6 and C6-7. Alignment appears anatomic. Hardware appears in good position in the AP and lateral projections. IMPRESSION: Satisfactory appearance of the cervical spine after anterior cervical fusion at C5-6 and C6-7. FLUOROSCOPY TIME:  13 seconds C-arm fluoroscopic images were obtained intraoperatively and submitted for post operative interpretation. Electronically Signed   By: Francene Boyers M.D.   On: 01/03/2018 14:32   Dg C-arm 1-60 Min  Result Date: 01/03/2018 CLINICAL DATA:  Cervical disc disease. EXAM: CERVICAL SPINE - 2-3 VIEW; DG C-ARM 61-120 MIN COMPARISON:  MRI dated 11/25/2017 FINDINGS: AP and lateral C-arm images demonstrate the patient has undergone  anterior cervical fusion at C5-6 and C6-7. Alignment appears anatomic. Hardware appears in good position in the AP and lateral projections. IMPRESSION: Satisfactory appearance of the cervical spine after anterior cervical fusion at C5-6 and C6-7. FLUOROSCOPY TIME:  13 seconds C-arm fluoroscopic images were obtained intraoperatively and submitted for post operative interpretation. Electronically Signed   By: Francene Boyers M.D.   On: 01/03/2018 14:32    Assessment/Plan: 1 Day Post-Op Procedure(s) (LRB): ANTERIOR CERVICAL DECOMPRESSION/DISCECTOMY FUSION, ALLOGRAFT, PLATE (N/A) Plan:  Drain removed. Discharge home office one week. Has extra collar for shower.   Joann Maddox 01/04/2018, 11:13 AM

## 2018-01-04 NOTE — Progress Notes (Signed)
Orthopedic Tech Progress Note Patient Details:  Joann Maddox [birth year] [id number]  Ortho Devices Type of Ortho Device: Soft collar Ortho Device/Splint Location: neck Ortho Device/Splint Interventions: Application   Post Interventions Patient Tolerated: Well Instructions Provided: Care of device   [name] 01/04/2018, 9:34 AM

## 2018-01-04 NOTE — Progress Notes (Signed)
Patient is discharged from room 3C11 at this time. Alert and in stable condition. IV site d/c'd and dressing changed. Instructions read to patient and spouse with understanding verbalized. Left unit via wheelchair with all belongings at side.

## 2018-01-05 ENCOUNTER — Encounter (HOSPITAL_COMMUNITY): Payer: Self-pay | Admitting: Orthopaedic Surgery

## 2018-01-09 ENCOUNTER — Other Ambulatory Visit: Payer: Self-pay | Admitting: Family Medicine

## 2018-01-10 NOTE — Telephone Encounter (Signed)
Dr. Linford Arnold,  She had this done on 12/30/17 results are in chart and pt was notified...Marland KitchenMarland KitchenHeath Gold, CMA

## 2018-01-11 ENCOUNTER — Ambulatory Visit (INDEPENDENT_AMBULATORY_CARE_PROVIDER_SITE_OTHER): Payer: Medicare Other | Admitting: Orthopaedic Surgery

## 2018-01-11 ENCOUNTER — Ambulatory Visit (INDEPENDENT_AMBULATORY_CARE_PROVIDER_SITE_OTHER): Payer: Medicare Other

## 2018-01-11 ENCOUNTER — Encounter (INDEPENDENT_AMBULATORY_CARE_PROVIDER_SITE_OTHER): Payer: Self-pay | Admitting: Orthopaedic Surgery

## 2018-01-11 VITALS — BP 121/63 | HR 68 | Ht 59.0 in | Wt 205.0 lb

## 2018-01-11 DIAGNOSIS — Z981 Arthrodesis status: Secondary | ICD-10-CM

## 2018-01-11 NOTE — Progress Notes (Signed)
   Post-Op Visit Note   Patient: Joann Maddox           Date of Birth: 06/19/1940           MRN: [id number] Visit Date: 01/11/2018 PCP: Agapito Games, MD   Assessment & Plan: 1 week post C5-6 C6-7 ACDF on 01/03/2018.  Steri-Strips changed incision looks good x-rays show good position.  Chief Complaint:  Chief Complaint  Patient presents with  . Neck - Routine Post Op    01/03/18  C5-6,C6-7 ACDF, Allograft, Plate   Visit Diagnoses:  1. Status post cervical spinal fusion     Plan: Return in 5 weeks for lateral flexion-extension C-spine x-rays.  She states she is happy with improvement in her pain.  Follow-Up Instructions: No follow-ups on file.   Orders:  Orders Placed This Encounter  Procedures  . XR Cervical Spine 2 or 3 views   No orders of the defined types were placed in this encounter.   Imaging: Xr Cervical Spine 2 Or 3 Views  Result Date: 01/11/2018 AP lateral cervical spine x-ray shows C5-6 C6-7 anterior cervical discectomy and fusion allograft and plate.  Good position alignment correction of previous reversal of normal lordosis. Impression: Satisfactory to level cervical fusion C5-6 C6-7.   PMFS History: Patient Active Problem List   Diagnosis Date Noted  . Cervical stenosis of spine 01/03/2018  . Foraminal stenosis of cervical region 12/15/2017  . Neck pain 10/20/2017  . Chronic venous insufficiency 04/29/2017  . Cervical spondylosis 07/20/2016  . 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HCC) 02/12/2015  . Type 2 diabetes mellitus without complication (HCC) 08/10/2014  . Chronic constipation 02/19/2014  . Rosacea 11/06/2013  . GAD (generalized anxiety disorder) 06/08/2013  . Essential hypertension, benign 05/04/2013  . Rheumatoid arthritis (HCC) 05/04/2013  . Migraine with aura 05/04/2013  . Palpitations 05/04/2013  . Murmur, heart 05/04/2013  . Hyperlipidemia 05/04/2013  .  Hypothyroidism 05/04/2013  . Fatigue 02/16/2012  . Obesity, morbid (HCC) 02/16/2012  . Urge incontinence 02/16/2012  . Hyperglycemia 01/12/2012   Past Medical History:  Diagnosis Date  . Anxiety   . Arthritis   . Depression   . Headache(784.0)    migrains  . Heart murmur   . Hypothyroidism   . Pre-diabetes   . Rheumatoid arthritis(714.0)     Family History  Problem Relation Age of Onset  . Heart disease Father 19  . Diabetes Unknown        aunt   . Stroke Mother 56    Past Surgical History:  Procedure Laterality Date  . ABDOMINAL HYSTERECTOMY    . ANTERIOR CERVICAL DECOMP/DISCECTOMY FUSION N/A 01/03/2018   Procedure: ANTERIOR CERVICAL DECOMPRESSION/DISCECTOMY FUSION, ALLOGRAFT, PLATE;  Surgeon: Eldred Manges, MD;  Location: [facility] OR;  Service: Orthopedics;  Laterality: N/A;  . APPENDECTOMY    . CHOLECYSTECTOMY    . REPLACEMENT TOTAL KNEE BILATERAL    . TONSILLECTOMY     as  a child  . TOTAL HIP ARTHROPLASTY     right and left   Social History   Occupational History  . Occupation: Retired Runner, broadcasting/film/video    Comment: Corporate treasurer  Tobacco Use  . Smoking status: Never Smoker  . Smokeless tobacco: Never Used  Substance and Sexual Activity  . Alcohol use: No  . Drug use: Not on file  . Sexual activity: Not on file

## 2018-01-24 ENCOUNTER — Other Ambulatory Visit: Payer: Self-pay | Admitting: Family Medicine

## 2018-01-24 ENCOUNTER — Other Ambulatory Visit: Payer: Self-pay | Admitting: Sports Medicine

## 2018-02-03 ENCOUNTER — Telehealth (INDEPENDENT_AMBULATORY_CARE_PROVIDER_SITE_OTHER): Payer: Self-pay | Admitting: Orthopaedic Surgery

## 2018-02-03 NOTE — Telephone Encounter (Signed)
Please advise. Would you like for me to add patient to afternoon schedule tomorrow?

## 2018-02-03 NOTE — Telephone Encounter (Signed)
She can give it some time.  Keep her appointment as scheduled.  It is been more than 4 weeks since her surgery and neck should be fine.  See her on the 22nd.  You call, thanks

## 2018-02-03 NOTE — Telephone Encounter (Addendum)
Patient called advised she fell yesterday 02/02/18 and she has neck and left arm pain. Patient said she has a bruise on her right shoulder. Patient asked if Dr Ophelia Charter will need to see her before her appointment on 02/15/18. The number to contact patient is [phone]

## 2018-02-03 NOTE — Telephone Encounter (Signed)
I called and spoke with patient. She did not fall face first, she fell directly on her back. She states that she could not even get up and they had to call the fire department to help get her up.  She is having neck and arm pain, but was having it prior to the fall, although this feels a little different. I did advise of Dr. Ophelia Charter message. She is going to take it easy and rest and see if she does not get better. I did ask for patient to return call if symptoms worsen prior to scheduled appointment.

## 2018-02-15 ENCOUNTER — Ambulatory Visit (INDEPENDENT_AMBULATORY_CARE_PROVIDER_SITE_OTHER): Payer: Self-pay

## 2018-02-15 ENCOUNTER — Encounter (INDEPENDENT_AMBULATORY_CARE_PROVIDER_SITE_OTHER): Payer: Self-pay | Admitting: Orthopaedic Surgery

## 2018-02-15 ENCOUNTER — Ambulatory Visit (INDEPENDENT_AMBULATORY_CARE_PROVIDER_SITE_OTHER): Payer: Medicare Other | Admitting: Orthopaedic Surgery

## 2018-02-15 VITALS — BP 135/71 | HR 68 | Ht 59.0 in | Wt 205.0 lb

## 2018-02-15 DIAGNOSIS — Z981 Arthrodesis status: Secondary | ICD-10-CM | POA: Diagnosis not present

## 2018-02-15 DIAGNOSIS — M7542 Impingement syndrome of left shoulder: Secondary | ICD-10-CM

## 2018-02-15 MED ORDER — LIDOCAINE HCL 1 % IJ SOLN
0.5000 mL | INTRAMUSCULAR | Status: AC | PRN
  Administered 2018-02-15: .5 mL

## 2018-02-15 MED ORDER — METHYLPREDNISOLONE ACETATE 40 MG/ML IJ SUSP
40.0000 mg | INTRAMUSCULAR | Status: AC | PRN
  Administered 2018-02-15: 40 mg via INTRA_ARTICULAR

## 2018-02-15 MED ORDER — BUPIVACAINE HCL 0.25 % IJ SOLN
4.0000 mL | INTRAMUSCULAR | Status: AC | PRN
  Administered 2018-02-15: 4 mL via INTRA_ARTICULAR

## 2018-02-15 NOTE — Progress Notes (Signed)
Office Visit Note   Patient: Joann Maddox           Date of Birth: September 26, 1940           MRN: [id number] Visit Date: 02/15/2018              Requested by: Agapito Games, MD [address] [place], [place] [address] PCP: Agapito Games, MD   Assessment & Plan: Visit Diagnoses:  1. Status post cervical spinal fusion   2. Impingement syndrome of left shoulder     Plan: Left shoulder injection performed recheck 1 month if she has persistent problems will consider MRI left shoulder.  Follow-Up Instructions: Return in about 1 month (around 03/18/2018).   Orders:  Orders Placed This Encounter  Procedures  . Large Joint Inj  . XR Cervical Spine 2 or 3 views   No orders of the defined types were placed in this encounter.     Procedures: Large Joint Inj: L subacromial bursa on 02/15/2018 3:13 PM Indications: pain Details: 22 G 1.5 in needle  Arthrogram: No  Medications: 4 mL bupivacaine 0.25 %; 40 mg methylPREDNISolone acetate 40 MG/ML; 0.5 mL lidocaine 1 % Outcome: tolerated well, no immediate complications Procedure, treatment alternatives, risks and benefits explained, specific risks discussed. Consent was given by the patient. Immediately prior to procedure a time out was called to verify the correct patient, procedure, equipment, support staff and site/side marked as required. Patient was prepped and draped in the usual sterile fashion.       Clinical Data: No additional findings.   Subjective: Chief Complaint  Patient presents with  . Neck - Follow-up    01/03/18 C5-6,C6-7 ACDF, Allograft, Plate    HPI follow-up to level cervical fusion.  Incision well-healed severe pain with outstretched reaching left arm she is able to only flex to 80 or 90 degrees with pain.  Review of Systems Unchanged from before surgery.  Objective: Vital Signs: BP 135/71   Pulse 68   Ht 4\' 11"  (1.499 m)   Wt 205 lb (93 kg)   BMI 41.40 kg/m    Physical Exam positive Neer negative Hawkins negative subluxation reflexes intact well-healed cervical incision.  Positive drop arm test left.  Negative right.  Right arm goes overhead.  Ortho Exam  Specialty Comments:  No specialty comments available.  Imaging: Xr Cervical Spine 2 Or 3 Views  Result Date: 02/15/2018 AP lateral cervical spine x-rays and lateral flexion-extension x-rays were obtained.  This shows C5-6 C6-7 cervical fusion with satisfactory graft incorporation and no motion on flexion-extension x-rays. Impression: Satisfactory to level cervical fusion C5-6, C6-7 with progressive graft incorporation.    PMFS History: Patient Active Problem List   Diagnosis Date Noted  . Status post cervical spinal fusion 01/11/2018  . Neck pain 10/20/2017  . Chronic venous insufficiency 04/29/2017  . 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HCC) 02/12/2015  . Type 2 diabetes mellitus without complication (HCC) 08/10/2014  . Chronic constipation 02/19/2014  . Rosacea 11/06/2013  . GAD (generalized anxiety disorder) 06/08/2013  . Essential hypertension, benign 05/04/2013  . Rheumatoid arthritis (HCC) 05/04/2013  . Migraine with aura 05/04/2013  . Palpitations 05/04/2013  . Murmur, heart 05/04/2013  . Hyperlipidemia 05/04/2013  . Hypothyroidism 05/04/2013  . Fatigue 02/16/2012  . Obesity, morbid (HCC) 02/16/2012  . Urge incontinence 02/16/2012  . Hyperglycemia 01/12/2012   Past Medical History:  Diagnosis Date  .  Anxiety   . Arthritis   . Depression   . Headache(784.0)    migrains  . Heart murmur   . Hypothyroidism   . Pre-diabetes   . Rheumatoid arthritis(714.0)     Family History  Problem Relation Age of Onset  . Heart disease Father 56  . Diabetes Unknown        aunt   . Stroke Mother 41    Past Surgical History:  Procedure Laterality Date  . ABDOMINAL HYSTERECTOMY    . ANTERIOR CERVICAL  DECOMP/DISCECTOMY FUSION N/A 01/03/2018   Procedure: ANTERIOR CERVICAL DECOMPRESSION/DISCECTOMY FUSION, ALLOGRAFT, PLATE;  Surgeon: Eldred Manges, MD;  Location: [facility] OR;  Service: Orthopedics;  Laterality: N/A;  . APPENDECTOMY    . CHOLECYSTECTOMY    . REPLACEMENT TOTAL KNEE BILATERAL    . TONSILLECTOMY     as  a child  . TOTAL HIP ARTHROPLASTY     right and left   Social History   Occupational History  . Occupation: Retired Runner, broadcasting/film/video    Comment: Corporate treasurer  Tobacco Use  . Smoking status: Never Smoker  . Smokeless tobacco: Never Used  Substance and Sexual Activity  . Alcohol use: No  . Drug use: Not on file  . Sexual activity: Not on file

## 2018-03-03 ENCOUNTER — Encounter (INDEPENDENT_AMBULATORY_CARE_PROVIDER_SITE_OTHER): Payer: Self-pay | Admitting: Surgery

## 2018-03-03 ENCOUNTER — Ambulatory Visit (INDEPENDENT_AMBULATORY_CARE_PROVIDER_SITE_OTHER): Payer: Medicare Other | Admitting: Surgery

## 2018-03-03 ENCOUNTER — Ambulatory Visit (INDEPENDENT_AMBULATORY_CARE_PROVIDER_SITE_OTHER): Payer: Self-pay

## 2018-03-03 VITALS — BP 127/60 | HR 68 | Temp 97.5°F | Ht 59.0 in | Wt 205.0 lb

## 2018-03-03 DIAGNOSIS — M5412 Radiculopathy, cervical region: Secondary | ICD-10-CM

## 2018-03-03 DIAGNOSIS — M2550 Pain in unspecified joint: Secondary | ICD-10-CM

## 2018-03-03 NOTE — Progress Notes (Signed)
Post-Op Visit Note   Patient: Joann Maddox           Date of Birth: 01-26-1941           MRN: [id number] Visit Date: 03/03/2018 PCP: Hali Marry, MD   Assessment & Plan:  Chief Complaint:  Chief Complaint  Patient presents with  . Neck - Pain  . Left Arm - Pain  . Right Arm - Pain    Patient comes in today with bilateral arm pain, neck pain and left shoulder pain.  Status post C5-6 and C6-7 ACDF January 03, 2018. Visit Diagnoses:  1. Radiculopathy, cervical region   2. Polyarthralgia     Plan: Patient states that she did not get any improvement with previous left shoulder subacromial injection.  She is describing pains consistent with polyarthritis.  Documented history of rheumatoid arthritis.  Recommend getting blood work to check a CBC and arthritis panel.  She has seen Dr. Amil Amen rheumatologist in the past.  Since she is describing question of cervical radicular symptoms as well she was given Toradol 30 mg IM and Depo-Medrol 80 mg IM injections today.  Hopefully this will help all areas of pain addressed today.  Follow with Dr. Lorin Mercy next Friday for recheck.  Let him decide if further imaging of the left shoulder or cervical spine is indicated at that point.  He will also review lab work from today.  Follow-Up Instructions: Return in about 8 days (around 03/11/2018) for With Dr. Lorin Mercy per Jeneen Rinks to review labs and recheck neck and shoulder.   Orders:  Orders Placed This Encounter  Procedures  . XR Cervical Spine 2 or 3 views  . CBC With Differential  . Sed Rate (ESR)  . C-reactive protein  . Antinuclear Antib (ANA)  . Rheumatoid Factor  . Uric acid  . HLA-B27 antigen   No orders of the defined types were placed in this encounter.   Imaging: No results found.  PMFS History: Patient Active Problem List   Diagnosis Date Noted  . Status post cervical spinal fusion 01/11/2018  . Neck pain 10/20/2017  . Chronic venous insufficiency 04/29/2017  .  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[facility]) 02/12/2015  . Type 2 diabetes mellitus without complication ([facility]) 63/89/3734  . Chronic constipation 02/19/2014  . Rosacea 11/06/2013  . GAD (generalized anxiety disorder) 06/08/2013  . Essential hypertension, benign 05/04/2013  . Rheumatoid arthritis ([facility]) 05/04/2013  . Migraine with aura 05/04/2013  . Palpitations 05/04/2013  . Murmur, heart 05/04/2013  . Hyperlipidemia 05/04/2013  . Hypothyroidism 05/04/2013  . Fatigue 02/16/2012  . Obesity, morbid ([facility]) 02/16/2012  . Urge incontinence 02/16/2012  . Hyperglycemia 01/12/2012   Past Medical History:  Diagnosis Date  . Anxiety   . Arthritis   . Depression   . Headache(784.0)    migrains  . Heart murmur   . Hypothyroidism   . Pre-diabetes   . Rheumatoid arthritis(714.0)     Family History  Problem Relation Age of Onset  . Heart disease Father 45  . Diabetes Unknown        aunt   . Stroke Mother 29    Past Surgical History:  Procedure Laterality Date  . ABDOMINAL HYSTERECTOMY    . ANTERIOR CERVICAL DECOMP/DISCECTOMY FUSION N/A 01/03/2018   Procedure: ANTERIOR CERVICAL DECOMPRESSION/DISCECTOMY FUSION, ALLOGRAFT, PLATE;  Surgeon: Marybelle Killings, MD;  Location: [facility];  Service: Orthopedics;  Laterality: N/A;  . APPENDECTOMY    .  CHOLECYSTECTOMY    . REPLACEMENT TOTAL KNEE BILATERAL    . TONSILLECTOMY     as  a child  . TOTAL HIP ARTHROPLASTY     right and left   Social History   Occupational History  . Occupation: Retired Pharmacist, hospital    Comment: Engineering geologist  Tobacco Use  . Smoking status: Never Smoker  . Smokeless tobacco: Never Used  Substance and Sexual Activity  . Alcohol use: No  . Drug use: Not on file  . Sexual activity: Not on file

## 2018-03-04 ENCOUNTER — Telehealth (INDEPENDENT_AMBULATORY_CARE_PROVIDER_SITE_OTHER): Payer: Self-pay | Admitting: Surgery

## 2018-03-05 LAB — CBC WITH DIFFERENTIAL
Basophils Absolute: 0.1 10*3/uL (ref 0.0–0.2)
Basos: 1 %
EOS (ABSOLUTE): 0.3 10*3/uL (ref 0.0–0.4)
Eos: 4 %
Hematocrit: 34.3 % (ref 34.0–46.6)
Hemoglobin: 11.3 g/dL (ref 11.1–15.9)
Immature Grans (Abs): 0 10*3/uL (ref 0.0–0.1)
Immature Granulocytes: 0 %
Lymphocytes Absolute: 2.3 10*3/uL (ref 0.7–3.1)
Lymphs: 39 %
MCH: 30.3 pg (ref 26.6–33.0)
MCHC: 32.9 g/dL (ref 31.5–35.7)
MCV: 92 fL (ref 79–97)
Monocytes Absolute: 0.6 10*3/uL (ref 0.1–0.9)
Monocytes: 10 %
Neutrophils Absolute: 2.7 10*3/uL (ref 1.4–7.0)
Neutrophils: 46 %
RBC: 3.73 x10E6/uL — ABNORMAL LOW (ref 3.77–5.28)
RDW: 13 % (ref 12.3–15.4)
WBC: 5.9 10*3/uL (ref 3.4–10.8)

## 2018-03-07 LAB — ANA: Anti Nuclear Antibody(ANA): NEGATIVE

## 2018-03-07 LAB — SEDIMENTATION RATE: Sed Rate: 9 mm/h (ref 0–30)

## 2018-03-07 LAB — C-REACTIVE PROTEIN: CRP: 0.8 mg/L (ref <8.0)

## 2018-03-07 LAB — URIC ACID: Uric Acid, Serum: 4.7 mg/dL (ref 2.5–7.0)

## 2018-03-07 LAB — RHEUMATOID FACTOR: Rhuematoid fact SerPl-aCnc: <14 IU/mL (ref <14)

## 2018-03-07 LAB — HLA-B27 ANTIGEN: HLA-B27 Antigen: NEGATIVE

## 2018-03-10 ENCOUNTER — Other Ambulatory Visit: Payer: Self-pay | Admitting: Family Medicine

## 2018-03-11 ENCOUNTER — Encounter (INDEPENDENT_AMBULATORY_CARE_PROVIDER_SITE_OTHER): Payer: Self-pay | Admitting: Orthopaedic Surgery

## 2018-03-11 ENCOUNTER — Ambulatory Visit (INDEPENDENT_AMBULATORY_CARE_PROVIDER_SITE_OTHER): Payer: Self-pay

## 2018-03-11 ENCOUNTER — Ambulatory Visit (INDEPENDENT_AMBULATORY_CARE_PROVIDER_SITE_OTHER): Payer: Medicare Other | Admitting: Orthopaedic Surgery

## 2018-03-11 VITALS — BP 140/65 | HR 72 | Ht 59.0 in | Wt 205.0 lb

## 2018-03-11 DIAGNOSIS — M25512 Pain in left shoulder: Secondary | ICD-10-CM | POA: Diagnosis not present

## 2018-03-11 DIAGNOSIS — Z981 Arthrodesis status: Secondary | ICD-10-CM

## 2018-03-11 DIAGNOSIS — G8929 Other chronic pain: Secondary | ICD-10-CM

## 2018-03-11 DIAGNOSIS — M19012 Primary osteoarthritis, left shoulder: Secondary | ICD-10-CM

## 2018-03-11 NOTE — Progress Notes (Signed)
Post-Op Visit Note   Patient: Joann Maddox           Date of Birth: 07-20-1940           MRN: [id number] Visit Date: 03/11/2018 PCP: Agapito Games, MD   Assessment & Plan: Post 2 level cervical fusion with continued problems with left shoulder pain inability to abduct her arm crepitus with range of motion and limited use of the left arm.  X-rays show severe osteoarthritis joint space erosion 1 to 2 cm inferior osteophytes and severe acromioclavicular degenerative changes.  Lab work for arthritis panel was all entirely normal.  We will set her up for an MRI scan of her left shoulder to evaluate her for rotator cuff tear.  We discussed with her severe arthritis joint arthroplasty would be required to give her pain relief of her left shoulder.  Chief Complaint:  Chief Complaint  Patient presents with  . Neck - Pain, Follow-up  . Right Shoulder - Pain, Follow-up  . Left Shoulder - Pain, Follow-up   Visit Diagnoses:  1. Chronic left shoulder pain   2. Primary osteoarthritis, left shoulder   3. Status post cervical spinal fusion     Plan: Return after left shoulder MRI scan.  Follow-Up Instructions: Office follow-up after shoulder MRI scan.  Orders:  Orders Placed This Encounter  Procedures  . XR Shoulder Left  . MR Shoulder Left w/o contrast   No orders of the defined types were placed in this encounter.   Imaging: Xr Shoulder Left  Result Date: 03/11/2018 2 view x-rays left shoulder show severe glenohumeral osteoarthritis with 1 to 2 cm in inferior osteophytes glenoid erosion humeral head erosion.  Significant acromioclavicular degenerative changes is also noted.  No high riding of the head.  There is some evidence of loose pieces posteriorly in the shoulder joint. Impression: Left shoulder severe osteoarthritis   PMFS History: Patient Active Problem List   Diagnosis Date Noted  . 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 Status post cervical spinal  fusion 01/11/2018  . Neck pain 10/20/2017  . Chronic venous insufficiency 04/29/2017  . 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[facility]) 02/12/2015  . Type 2 diabetes mellitus without complication ([facility]) 08/10/2014  . Chronic constipation 02/19/2014  . Rosacea 11/06/2013  . GAD (generalized anxiety disorder) 06/08/2013  . Essential hypertension, benign 05/04/2013  . Migraine with aura 05/04/2013  . Palpitations 05/04/2013  . Murmur, heart 05/04/2013  . Hyperlipidemia 05/04/2013  . Hypothyroidism 05/04/2013  . Fatigue 02/16/2012  . Obesity, morbid ([facility]) 02/16/2012  . Urge incontinence 02/16/2012  . Hyperglycemia 01/12/2012   Past Medical History:  Diagnosis Date  . Anxiety   . Arthritis   . Depression   . Headache(784.0)    migrains  . Heart murmur   . Hypothyroidism   . Pre-diabetes   . Rheumatoid arthritis(714.0)     Family History  Problem Relation Age of Onset  . Heart disease Father 50  . Diabetes Unknown        aunt   . Stroke Mother 65    Past Surgical History:  Procedure Laterality Date  . ABDOMINAL HYSTERECTOMY    . ANTERIOR CERVICAL DECOMP/DISCECTOMY FUSION N/A 01/03/2018   Procedure: ANTERIOR CERVICAL DECOMPRESSION/DISCECTOMY FUSION, ALLOGRAFT, PLATE;  Surgeon: Eldred Manges, MD;  Location: [facility];  Service: Orthopedics;  Laterality: N/A;  . APPENDECTOMY    . CHOLECYSTECTOMY    . REPLACEMENT TOTAL KNEE  BILATERAL    . TONSILLECTOMY     as  a child  . TOTAL HIP ARTHROPLASTY     right and left   Social History   Occupational History  . Occupation: Retired Runner, broadcasting/film/video    Comment: Corporate treasurer  Tobacco Use  . Smoking status: Never Smoker  . Smokeless tobacco: Never Used  Substance and Sexual Activity  . Alcohol use: No  . Drug use: Not on file  . Sexual activity: Not on file

## 2018-03-12 ENCOUNTER — Other Ambulatory Visit: Payer: Self-pay | Admitting: Family Medicine

## 2018-03-18 ENCOUNTER — Ambulatory Visit (INDEPENDENT_AMBULATORY_CARE_PROVIDER_SITE_OTHER): Payer: Medicare Other | Admitting: Orthopaedic Surgery

## 2018-03-20 ENCOUNTER — Ambulatory Visit
Admission: RE | Admit: 2018-03-20 | Discharge: 2018-03-20 | Disposition: A | Discharge status: Home / Self Care | Payer: Medicare Other | Source: Ambulatory Visit | Attending: Orthopaedic Surgery | Admitting: Orthopaedic Surgery

## 2018-03-20 DIAGNOSIS — G8929 Other chronic pain: Secondary | ICD-10-CM

## 2018-03-20 DIAGNOSIS — M25512 Pain in left shoulder: Principal | ICD-10-CM

## 2018-03-29 ENCOUNTER — Ambulatory Visit (INDEPENDENT_AMBULATORY_CARE_PROVIDER_SITE_OTHER): Payer: Medicare Other | Admitting: Orthopaedic Surgery

## 2018-03-31 ENCOUNTER — Encounter: Payer: Self-pay | Admitting: Family Medicine

## 2018-03-31 ENCOUNTER — Ambulatory Visit (INDEPENDENT_AMBULATORY_CARE_PROVIDER_SITE_OTHER): Payer: Medicare Other | Admitting: Family Medicine

## 2018-03-31 VITALS — BP 138/56 | HR 70 | Ht 59.0 in | Wt 205.0 lb

## 2018-03-31 DIAGNOSIS — E038 Other specified hypothyroidism: Secondary | ICD-10-CM | POA: Diagnosis not present

## 2018-03-31 DIAGNOSIS — I10 Essential (primary) hypertension: Secondary | ICD-10-CM

## 2018-03-31 DIAGNOSIS — E119 Type 2 diabetes mellitus without complications: Secondary | ICD-10-CM

## 2018-03-31 DIAGNOSIS — R5383 Other fatigue: Secondary | ICD-10-CM | POA: Diagnosis not present

## 2018-03-31 LAB — POCT GLYCOSYLATED HEMOGLOBIN (HGB A1C): Hemoglobin A1C: 5.6 % (ref 4.0–5.6)

## 2018-03-31 MED ORDER — LEVOTHYROXINE SODIUM 25 MCG PO TABS: 25.0000 ug | ORAL_TABLET | Freq: Every day | ORAL | 3 refills | Status: DC

## 2018-03-31 MED ORDER — AZELAIC ACID 20 % EX CREA: TOPICAL_CREAM | CUTANEOUS | 1 refills | Status: DC

## 2018-03-31 MED ORDER — NYSTATIN 100000 UNIT/GM EX POWD: CUTANEOUS | 1 refills | Status: DC

## 2018-03-31 MED ORDER — DULOXETINE HCL 60 MG PO CPEP: 60.0000 mg | ORAL_CAPSULE | Freq: Every day | ORAL | 3 refills | Status: DC

## 2018-03-31 NOTE — Progress Notes (Addendum)
Subjective:    CC: DM, HTN  HPI:  Hypertension- Pt denies chest pain, SOB, dizziness, or heart palpitations.  Taking meds as directed w/o problems.  Denies medication side effects.    Diabetes - no hypoglycemic events. No wounds or sores that are not healing well. No increased thirst or urination. Checking glucose at home. Taking medications as prescribed without any side effects.  Since having her cervical spine surgery in September she is just really struggled with energy level she just feels generally weak and tired she is having a hard time getting up from a chair.  No fevers or chills.  She has not engaged in any formal physical therapy.  She does have a follow-up with her orthopedist tomorrow they are working up some left shoulder pain.  Past medical history, Surgical history, Family history not pertinant except as noted below, Social history, Allergies, and medications have been entered into the medical record, reviewed, and corrections made.   Review of Systems: No fevers, chills, night sweats, weight loss, chest pain, or shortness of breath.   Objective:    General: Well Developed, well nourished, and in no acute distress.  Neuro: Alert and oriented x3, extra-ocular muscles intact, sensation grossly intact.  HEENT: Normocephalic, atraumatic  Skin: Warm and dry, no rashes. Cardiac: Regular rate and rhythm. 2/6 SEM.  No  rubs or gallops, no lower extremity edema.  Respiratory: Clear to auscultation bilaterally. Not using accessory muscles, speaking in full sentences.   Impression and Recommendations:    HTN - Well controlled. Continue current regimen. Follow up in  6 months.    DM -still well controlled.  We discussed the importance of really cutting back on her sugar and carb intake in fact if she does this we might even be able to get her off her metformin which would be fantastic.  Fatigue-we will check for postoperative anemia check thyroid as well as electrolyte  abnormality.  Will call with his results once available.  Generalized weakness and gait instability-I do think she would benefit from more formal physical therapy I think she is just deconditioned after her surgery and was deconditioned prior to surgery because of pain and discomfort and unable to exercise regularly.  Happy to refer her but since she is seeing her orthopedist tomorrow she can ask him initially to see if he wants to order it and if not I will be happy to.

## 2018-03-31 NOTE — Patient Instructions (Signed)
Work on cutting back on sugar intake.

## 2018-04-01 ENCOUNTER — Encounter (INDEPENDENT_AMBULATORY_CARE_PROVIDER_SITE_OTHER): Payer: Self-pay | Admitting: Orthopaedic Surgery

## 2018-04-01 ENCOUNTER — Ambulatory Visit (INDEPENDENT_AMBULATORY_CARE_PROVIDER_SITE_OTHER): Payer: Medicare Other | Admitting: Orthopaedic Surgery

## 2018-04-01 VITALS — BP 140/67 | HR 65 | Ht 62.0 in | Wt 205.0 lb

## 2018-04-01 DIAGNOSIS — M19012 Primary osteoarthritis, left shoulder: Secondary | ICD-10-CM

## 2018-04-01 LAB — COMPLETE METABOLIC PANEL WITH GFR
AG Ratio: 1.7 (calc) (ref 1.0–2.5)
ALT: 19 U/L (ref 6–29)
AST: 24 U/L (ref 10–35)
Albumin: 4 g/dL (ref 3.6–5.1)
Alkaline phosphatase (APISO): 83 U/L (ref 33–130)
BUN: 20 mg/dL (ref 7–25)
CO2: 29 mmol/L (ref 20–32)
Calcium: 10 mg/dL (ref 8.6–10.4)
Chloride: 107 mmol/L (ref 98–110)
Creat: 0.73 mg/dL (ref 0.60–0.93)
GFR, Est African American: 93 mL/min/{1.73_m2} (ref >=60)
GFR, Est Non African American: 81 mL/min/{1.73_m2} (ref >=60)
Globulin: 2.4 g/dL (calc) (ref 1.9–3.7)
Glucose, Bld: 77 mg/dL (ref 65–99)
Potassium: 4.1 mmol/L (ref 3.5–5.3)
Sodium: 143 mmol/L (ref 135–146)
Total Bilirubin: 0.5 mg/dL (ref 0.2–1.2)
Total Protein: 6.4 g/dL (ref 6.1–8.1)

## 2018-04-01 LAB — CBC
HCT: 35 % (ref 35.0–45.0)
Hemoglobin: 11.5 g/dL — ABNORMAL LOW (ref 11.7–15.5)
MCH: 30.2 pg (ref 27.0–33.0)
MCHC: 32.9 g/dL (ref 32.0–36.0)
MCV: 91.9 fL (ref 80.0–100.0)
MPV: 10 fL (ref 7.5–12.5)
Platelets: 264 10*3/uL (ref 140–400)
RBC: 3.81 10*6/uL (ref 3.80–5.10)
RDW: 13.1 % (ref 11.0–15.0)
WBC: 4.7 10*3/uL (ref 3.8–10.8)

## 2018-04-01 LAB — VITAMIN D 25 HYDROXY (VIT D DEFICIENCY, FRACTURES): Vit D, 25-Hydroxy: 53 ng/mL (ref 30–100)

## 2018-04-01 LAB — TSH: TSH: 0.45 mIU/L (ref 0.40–4.50)

## 2018-04-03 ENCOUNTER — Encounter (INDEPENDENT_AMBULATORY_CARE_PROVIDER_SITE_OTHER): Payer: Self-pay | Admitting: Orthopaedic Surgery

## 2018-04-03 NOTE — Progress Notes (Signed)
Office Visit Note   Patient: Joann Maddox           Date of Birth: [birth year]           MRN: [id number] Visit Date: 04/01/2018              Requested by: Agapito Games, MD [id number] [place], [place] [address] PCP: Agapito Games, MD   Assessment & Plan: Visit Diagnoses:  1. Primary osteoarthritis, left shoulder     Plan: Patient has severe left shoulder osteoarthritis with multiple loose bodies 1 to 2 cm osteophytes.  Opposite right shoulder still functioning well.  We discussed only option would be left total shoulder arthroplasty.  Rheumatologic labs have been negative.  We discussed total shoulder arthroplasty, 1-2 night stay in the hospital, postoperative therapy.  Risks of infection, revision surgery, risks of anesthesia, shoulder dislocation, and nerve traction injuries.  She can discuss with this with her family and states she like to consider proceeding after the holiday she will call about scheduling.  Follow-Up Instructions: No follow-ups on file.   Orders:  No orders of the defined types were placed in this encounter.  No orders of the defined types were placed in this encounter.     Procedures: No procedures performed   Clinical Data: No additional findings.   Subjective: Chief Complaint  Patient presents with  . Left Shoulder - Follow-up    HPI 77 year old female returns with ongoing problems with severe left shoulder pain limitation of range of motion pain that wakes her up at night pain with activities of daily living.  She can reach overhead with her opposite right shoulder but not with her left.  MRI scan is been obtained which shows intact rotator cuff.  She has severe glenohumeral arthritis with multiple loose bodies and grade IV chondromalacia.  She has been using tramadol for pain.  She is gotten improvement from her cervical fusion in September 2019 at C5-6 C6-7.  No numbness or tingling in her fingers pain is  reproduced with left shoulder range of motion.  Review of Systems impervious systems positive for previous bilateral knee arthroplasties and lateral hip arthroplasties with some discomfort.  Cervical fusion 01/03/2018 at C5-6 C6-7.  Positive for hyperlipidemia hypothyroidism anxiety severe left shoulder osteoarthritis.  Urge incontinence, type 2 diabetes, C1-C2 degenerative changes left side.  Obesity.  Otherwise negative is a pertains HPI 14 point systems updated.   Objective: Vital Signs: BP 140/67   Pulse 65   Ht 5\' 2"  (1.575 m)   Wt 205 lb (93 kg)   BMI 37.49 kg/m   Physical Exam  Constitutional: She is oriented to person, place, and time. She appears well-developed.  HENT:  Head: Normocephalic.  Right Ear: External ear normal.  Left Ear: External ear normal.  Eyes: Pupils are equal, round, and reactive to light.  Neck: No tracheal deviation present. No thyromegaly present.  Cardiovascular: Normal rate.  Pulmonary/Chest: Effort normal.  Abdominal: Soft.  Neurological: She is alert and oriented to person, place, and time.  Skin: Skin is warm and dry.  Psychiatric: She has a normal mood and affect. Her behavior is normal.    Ortho Exam well-healed hip and knee incisions.  Upper extremity reflexes are 2+ and symmetrical.  She has 50% rotation of the left with left-sided neck pain.  Right shoulder she can get up overhead easily negative drop arm test.  Left shoulder has abduction to 70 degrees with pain  flexion to 80 degrees with pain pain with internal and external rotation internal rotation to hand at mid axillary line only.  Specialty Comments:  No specialty comments available.  Imaging: CLINICAL DATA:  Chronic left shoulder pain with limited range of motion and crepitus. Osteoarthritis of the glenohumeral joint.  EXAM: MRI OF THE LEFT SHOULDER WITHOUT CONTRAST  TECHNIQUE: Multiplanar, multisequence MR imaging of the shoulder was performed. No intravenous contrast was  administered.  COMPARISON:  Radiographs dated 07/20/2016  FINDINGS: Rotator cuff: Intact supraspinatus, infraspinatus, teres minor and subscapularis tendons.  Muscles: Diffuse symmetrical moderate atrophy of the muscles of the rotator cuff.  Biceps long head: Intact and properly located. Degeneration of the intra-articular portion of tendon.  Acromioclavicular Joint: Moderate AC joint arthropathy. Type 2 acromion. No bursitis  Glenohumeral Joint: Severe osteoarthritis of the glenohumeral joint with complete denuding of the articular cartilage with prominent osteophytes on the glenoid and humeral head. Multiple large ossified loose bodies in the subcoracoid recess. Prominent joint effusion. Prominent outpouching of the sheath of the bicipital tendon best seen on image 14 of series 3.  Labrum:  Diffusely severely degenerated.  Bones:  No acute abnormalities.  Extra-articular bones are normal.  Other: None  IMPRESSION: 1. Severe osteoarthritis of the glenohumeral joint with multiple loose bodies in a moderate joint effusion. 2. Intact rotator cuff. Diffuse moderate atrophy of the muscles of the rotator cuff.   Electronically Signed   By: Francene Boyers M.D.   On: 03/21/2018 09:27  2 view x-rays left shoulder show severe glenohumeral osteoarthritis with 1  to 2 cm in inferior osteophytes glenoid erosion humeral head erosion.  Significant acromioclavicular degenerative changes is also noted. No high  riding of the head. There is some evidence of loose pieces posteriorly in  the shoulder joint.  Impression: Left shoulder severe osteoarthritis PMFS History: Patient Active Problem List   Diagnosis Date Noted  . 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 Status post cervical spinal fusion 01/11/2018  . Neck pain 10/20/2017  . Chronic venous insufficiency 04/29/2017  . 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 Microalbuminuria due to type  2 diabetes mellitus ([facility]) 02/12/2015  . Type 2 diabetes mellitus without complication ([facility]) 08/10/2014  . Chronic constipation 02/19/2014  . Rosacea 11/06/2013  . GAD (generalized anxiety disorder) 06/08/2013  . Essential hypertension, benign 05/04/2013  . Migraine with aura 05/04/2013  . Palpitations 05/04/2013  . Murmur, heart 05/04/2013  . Hyperlipidemia 05/04/2013  . Hypothyroidism 05/04/2013  . Fatigue 02/16/2012  . Obesity, morbid ([facility]) 02/16/2012  . Urge incontinence 02/16/2012  . Hyperglycemia 01/12/2012   Past Medical History:  Diagnosis Date  . Anxiety   . Arthritis   . Depression   . Headache(784.0)    migrains  . Heart murmur   . Hypothyroidism   . Pre-diabetes   . Rheumatoid arthritis(714.0)     Family History  Problem Relation Age of Onset  . Heart disease Father 29  . Diabetes Unknown        aunt   . Stroke Mother 69    Past Surgical History:  Procedure Laterality Date  . ABDOMINAL HYSTERECTOMY    . ANTERIOR CERVICAL DECOMP/DISCECTOMY FUSION N/A 01/03/2018   Procedure: ANTERIOR CERVICAL DECOMPRESSION/DISCECTOMY FUSION, ALLOGRAFT, PLATE;  Surgeon: Eldred Manges, MD;  Location: [facility];  Service: Orthopedics;  Laterality: N/A;  . APPENDECTOMY    . CHOLECYSTECTOMY    . REPLACEMENT TOTAL KNEE BILATERAL    . TONSILLECTOMY     as  a child  . TOTAL HIP ARTHROPLASTY     right and left   Social History   Occupational History  . Occupation: Retired Runner, broadcasting/film/video    Comment: Corporate treasurer  Tobacco Use  . Smoking status: Never Smoker  . Smokeless tobacco: Never Used  Substance and Sexual Activity  . Alcohol use: No  . Drug use: Not on file  . Sexual activity: Not on file

## 2018-04-04 ENCOUNTER — Other Ambulatory Visit: Payer: Self-pay | Admitting: Family Medicine

## 2018-04-06 ENCOUNTER — Other Ambulatory Visit: Payer: Self-pay | Admitting: Family Medicine

## 2018-04-30 ENCOUNTER — Other Ambulatory Visit: Payer: Self-pay | Admitting: Family Medicine

## 2018-05-03 ENCOUNTER — Ambulatory Visit (INDEPENDENT_AMBULATORY_CARE_PROVIDER_SITE_OTHER): Payer: Medicare Other | Admitting: Family Medicine

## 2018-05-03 ENCOUNTER — Encounter: Payer: Self-pay | Admitting: Family Medicine

## 2018-05-03 VITALS — BP 133/83 | HR 71 | Ht 62.0 in | Wt 205.0 lb

## 2018-05-03 DIAGNOSIS — M6281 Muscle weakness (generalized): Secondary | ICD-10-CM | POA: Diagnosis not present

## 2018-05-03 DIAGNOSIS — M791 Myalgia, unspecified site: Secondary | ICD-10-CM | POA: Diagnosis not present

## 2018-05-03 DIAGNOSIS — M79604 Pain in right leg: Secondary | ICD-10-CM | POA: Diagnosis not present

## 2018-05-03 DIAGNOSIS — M25551 Pain in right hip: Secondary | ICD-10-CM

## 2018-05-03 MED ORDER — LISINOPRIL 10 MG PO TABS: 10.0000 mg | ORAL_TABLET | Freq: Every day | ORAL | 1 refills | Status: DC

## 2018-05-03 MED ORDER — AZELAIC ACID 20 % EX CREA: TOPICAL_CREAM | CUTANEOUS | 1 refills | Status: DC

## 2018-05-03 MED ORDER — NYSTATIN 100000 UNIT/GM EX POWD: CUTANEOUS | 1 refills | Status: DC

## 2018-05-03 NOTE — Patient Instructions (Signed)
Ok to increase tramadol to 3 times a day.

## 2018-05-03 NOTE — Progress Notes (Signed)
Acute Office Visit  Subjective:    Patient ID: TAMARRA GEISELMAN, female    DOB: Aug 27, 1940, 78 y.o.   MRN: [id number]  Chief Complaint  Patient presents with  . Leg Pain    x 2wks she said that her R leg is worse but both legs hurt  . Hip Pain    R hip x 2 wks      HPI Patient is in today for diffuse musculoskeletal pain for about 2 weeks.  She says in particular its her upper chest upper arms and then down her arms as well as her hips and down into her thighs bilaterally.  She says it is really in both legs but it is worse than the right.  She is also had some right hip pain for about 2 weeks as well.  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  . She also reports some swelling in her feet.  She says it was worse last week.  She still has some swelling but it has improved.  She actually just had normal renal function about 3 weeks ago and we did blood work in early December.  She complains that her pelvic pain is severe enough that it is painful to get up move and even walk and so she has actually been more sedentary.  She has been taking tramadol twice a day 1 tab each time.  It does relieve some of her pain but not completely.  Today she reports her pain is an 8 out of 10 its painful just to get out of the chair.  Is actually supposed to have surgery on her left shoulder and was doing physical therapy previously.  Past Medical History:  Diagnosis Date  . Anxiety   . Arthritis   . Depression   . Headache(784.0)    migrains  . Heart murmur   . Hypothyroidism   . Pre-diabetes   . Rheumatoid arthritis(714.0)     Past Surgical History:  Procedure Laterality Date  . ABDOMINAL HYSTERECTOMY    . ANTERIOR CERVICAL DECOMP/DISCECTOMY FUSION N/A 01/03/2018   Procedure: ANTERIOR CERVICAL DECOMPRESSION/DISCECTOMY FUSION, ALLOGRAFT, PLATE;  Surgeon: Eldred Manges, MD;  Location: [facility] OR;  Service: Orthopedics;  Laterality: N/A;  . APPENDECTOMY    .  CHOLECYSTECTOMY    . REPLACEMENT TOTAL KNEE BILATERAL    . TONSILLECTOMY     as  a child  . TOTAL HIP ARTHROPLASTY     right and left    Family History  Problem Relation Age of Onset  . Heart disease Father 2  . Diabetes Unknown        aunt   . Stroke Mother 22    Social History   Socioeconomic History  . Marital status: Married    Spouse name: Dorene Sorrow  . Number of children: 2  . Years of education: Not on file  . Highest education level: Not on file  Occupational History  . Occupation: Retired Runner, broadcasting/film/video    Comment: Corporate treasurer  Social Needs  . Financial resource strain: Not on file  . Food insecurity:    Worry: Not on file    Inability: Not on file  . Transportation needs:    Medical: Not on file    Non-medical: Not on file  Tobacco Use  . Smoking status: Never Smoker  . Smokeless tobacco: Never Used  Substance and Sexual Activity  . Alcohol use: No  . Drug use: Not  on file  . Sexual activity: Not on file  Lifestyle  . Physical activity:    Days per week: Not on file    Minutes per session: Not on file  . Stress: Not on file  Relationships  . Social connections:    Talks on phone: Not on file    Gets together: Not on file    Attends religious service: Not on file    Active member of club or organization: Not on file    Attends meetings of clubs or organizations: Not on file    Relationship status: Not on file  . Intimate partner violence:    Fear of current or ex partner: Not on file    Emotionally abused: Not on file    Physically abused: Not on file    Forced sexual activity: Not on file  Other Topics Concern  . Not on file  Social History Narrative   No regular exercise. 2 caffeine drinks per day.     Outpatient Medications Prior to Visit  Medication Sig Dispense Refill  . butalbital-acetaminophen-caffeine (FIORICET, ESGIC) 50-325-40 MG tablet Take 1 tablet by mouth 2 (two) times daily as needed. (Patient taking differently: Take 1 tablet by  mouth 2 (two) times daily as needed for migraine. ) 14 tablet 5  . DULoxetine (CYMBALTA) 60 MG capsule Take 1 capsule (60 mg total) by mouth daily. 90 capsule 3  . folic acid (FOLVITE) 1 MG tablet TAKE 1 TABLET BY MOUTH EVERY DAY (Patient taking differently: Take 1 mg by mouth daily. ) 90 tablet 2  . furosemide (LASIX) 20 MG tablet TAKE 1 TABLET BY MOUTH EVERY DAY AS NEEDED (Patient taking differently: Take 20 mg by mouth daily as needed for fluid. ) 90 tablet 1  . hydroxychloroquine (PLAQUENIL) 200 MG tablet 2 TABLET WITH FOOD OR MILK ONCE A DAY ORALLY 60 tablet 1  . levothyroxine (SYNTHROID, LEVOTHROID) 25 MCG tablet Take 1 tablet (25 mcg total) by mouth daily. 90 tablet 3  . meloxicam (MOBIC) 15 MG tablet TAKE ONE TABLET BY MOUTH EACH AM WITH BREAKFAST AS NEEDED FOR PAIN 30 tablet 2  . metFORMIN (GLUCOPHAGE) 500 MG tablet TAKE 1 TABLET BY MOUTH 2 TIMES DAILY WITH A MEAL. (Patient taking differently: Take 500 mg by mouth daily. ) 180 tablet 1  . mirabegron ER (MYRBETRIQ) 25 MG TB24 tablet Take 1 tablet (25 mg total) by mouth at bedtime. 90 tablet 1  . Multiple Vitamin (MULTIVITAMIN) tablet Take 1 tablet by mouth daily.    . Omega-3 Fatty Acids (FISH OIL) 1200 MG CAPS Take 1,200 mg by mouth 2 (two) times a week. WITH OMEGA-3 360 MG    . potassium chloride (K-DUR) 10 MEQ tablet TAKE 1 TABLET (10 MEQ TOTAL) BY MOUTH 2 (TWO) TIMES DAILY. 60 tablet 11  . rosuvastatin (CRESTOR) 10 MG tablet Take 1 tablet (10 mg total) by mouth at bedtime. 90 tablet 3  . senna (SENOKOT) 8.6 MG tablet Take 1 tablet by mouth at bedtime.     . traMADol (ULTRAM) 50 MG tablet TAKE 1-2 TABS BY MOUTH EVERY 12 HOURS AS NEEDED FOR PAIN 120 tablet 1  . TURMERIC PO Take 1,000 mg by mouth daily.    . vitamin B-12 (CYANOCOBALAMIN) 100 MCG tablet Take 100 mcg by mouth at bedtime.     . Vitamin D, Ergocalciferol, (DRISDOL) 50000 units CAPS capsule TAKE 1 CAPSULE (50,000 UNITS TOTAL) BY MOUTH ONCE A WEEK. (Patient taking differently:  Take 50,000 Units by mouth  every Sunday. ) 12 capsule 2  . azelaic acid (AZELEX) 20 % cream APPLY TOPICALL 2 TIMES A DAY (MORNING AND EVENING). APPLY AFTER SKIN IS WASHED AND PATTED DRY. 30 g 1  . lisinopril (PRINIVIL,ZESTRIL) 10 MG tablet Take 1 tablet (10 mg total) by mouth daily. 90 tablet 1  . nystatin (MYCOSTATIN/NYSTOP) powder APPLY 1 GRAM TOPICALLY 2 (TWO) TIMES DAILY. X 3 WEEKS. 60 g 1   No facility-administered medications prior to visit.     Allergies  Allergen Reactions  . Iodine Other (See Comments)    Shook violently and passed out.  . Codeine Other (See Comments)    Hallucinations  . Lipitor [Atorvastatin] Other (See Comments)    Myalgias    ROS     Objective:    Physical Exam  Constitutional: She is oriented to person, place, and time. She appears well-developed and well-nourished.  HENT:  Head: Normocephalic and atraumatic.  Eyes: Conjunctivae and EOM are normal.  Cardiovascular: Normal rate.  Pulmonary/Chest: Effort normal.  Musculoskeletal:     Comments: Bilateral arm strength is 4-5 by laterally at the shoulders with extension.  Elbow and wrist strength is 5-5 bilaterally.  Hip strength is 4-5 bilaterally.  Knee and ankle strength is 5-5 bilaterally.  She does have some 1+ pitting edema in both feet and ankles.  Patellar reflexes 0+ bilaterally.   Neurological: She is alert and oriented to person, place, and time.  Skin: Skin is dry. No pallor.  Psychiatric: She has a normal mood and affect. Her behavior is normal.  Vitals reviewed.   BP 133/83   Pulse 71   Ht 5\' 2"  (1.575 m)   Wt 205 lb (93 kg)   SpO2 100%   BMI 37.49 kg/m  Wt Readings from Last 3 Encounters:  05/03/18 205 lb (93 kg)  04/01/18 205 lb (93 kg)  03/31/18 205 lb (93 kg)    Health Maintenance Due  Topic Date Due  . OPHTHALMOLOGY EXAM  06/25/1952  . TETANUS/TDAP  06/25/1961  . COLONOSCOPY  09/09/2015  . INFLUENZA VACCINE  11/25/2017    There are no preventive care reminders to  display for this patient.   Lab Results  Component Value Date   TSH 0.45 03/31/2018   Lab Results  Component Value Date   WBC 4.7 03/31/2018   HGB 11.5 (L) 03/31/2018   HCT 35.0 03/31/2018   MCV 91.9 03/31/2018   PLT 264 03/31/2018   Lab Results  Component Value Date   NA 143 03/31/2018   K 4.1 03/31/2018   CO2 29 03/31/2018   GLUCOSE 77 03/31/2018   BUN 20 03/31/2018   CREATININE 0.73 03/31/2018   BILITOT 0.5 03/31/2018   ALKPHOS 66 12/23/2017   AST 24 03/31/2018   ALT 19 03/31/2018   PROT 6.4 03/31/2018   ALBUMIN 3.7 12/23/2017   CALCIUM 10.0 03/31/2018   ANIONGAP 10 01/04/2018   Lab Results  Component Value Date   CHOL 128 12/03/2017   Lab Results  Component Value Date   HDL 77 12/03/2017   Lab Results  Component Value Date   LDLCALC 35 12/03/2017   Lab Results  Component Value Date   TRIG 75 12/03/2017   Lab Results  Component Value Date   CHOLHDL 1.7 12/03/2017   Lab Results  Component Value Date   HGBA1C 5.6 03/31/2018       Assessment & Plan:   Problem List Items Addressed This Visit    None    Visit  Diagnoses    Right hip pain    -  Primary   Relevant Orders   RPR   Sedimentation rate   ANA   Uric acid   CK (Creatine Kinase)   Right leg pain       Myalgia       Relevant Orders   RPR   Sedimentation rate   ANA   Uric acid   CK (Creatine Kinase)   Muscle weakness       Relevant Orders   RPR   Sedimentation rate   ANA   Uric acid   CK (Creatine Kinase)      Concerned about the possibility of polymyalgia rheumatica based on her description of her pain.  It seems quite intense and seems diffuse mostly over her upper body and hips and thighs.  We will check a CK as well as inflammatory markers.  We will call her with results once available.  She recently had a negative rheumatoid factor and ANA.  Also check a uric acid level.  Okay to increase her tramadol to 3 times a day.  Also consider that it could just be part of her  arthritis.  Discussed the importance of controlling her pain so that she can stay active.  The more sedentary she is the more stiff and sore she will continue to be in the more week she will feel.  Meds ordered this encounter  Medications  . azelaic acid (AZELEX) 20 % cream    Sig: APPLY TOPICALL 2 TIMES A DAY (MORNING AND EVENING). APPLY AFTER SKIN IS WASHED AND PATTED DRY.    Dispense:  30 g    Refill:  1  . lisinopril (PRINIVIL,ZESTRIL) 10 MG tablet    Sig: Take 1 tablet (10 mg total) by mouth daily.    Dispense:  90 tablet    Refill:  1  . nystatin (MYCOSTATIN/NYSTOP) powder    Sig: APPLY 1 GRAM TOPICALLY 2 (TWO) TIMES DAILY. X 3 WEEKS.    Dispense:  60 g    Refill:  1     Nani Gasseratherine Metheney, MD

## 2018-05-06 LAB — URIC ACID: Uric Acid, Serum: 3.5 mg/dL (ref 2.5–7.0)

## 2018-05-06 LAB — RPR: RPR Ser Ql: NONREACTIVE

## 2018-05-06 LAB — SEDIMENTATION RATE: Sed Rate: 11 mm/h (ref 0–30)

## 2018-05-06 LAB — ANA: Anti Nuclear Antibody(ANA): NEGATIVE

## 2018-05-06 LAB — CK: Total CK: 146 U/L — ABNORMAL HIGH (ref 29–143)

## 2018-05-09 ENCOUNTER — Other Ambulatory Visit: Payer: Self-pay | Admitting: Sports Medicine

## 2018-05-09 ENCOUNTER — Ambulatory Visit (INDEPENDENT_AMBULATORY_CARE_PROVIDER_SITE_OTHER): Payer: Medicare Other | Admitting: Family Medicine

## 2018-05-09 ENCOUNTER — Ambulatory Visit (INDEPENDENT_AMBULATORY_CARE_PROVIDER_SITE_OTHER): Payer: Medicare Other

## 2018-05-09 ENCOUNTER — Encounter: Payer: Self-pay | Admitting: Family Medicine

## 2018-05-09 VITALS — BP 142/76 | HR 83 | Wt 209.0 lb

## 2018-05-09 DIAGNOSIS — Z96643 Presence of artificial hip joint, bilateral: Secondary | ICD-10-CM

## 2018-05-09 DIAGNOSIS — M5136 Other intervertebral disc degeneration, lumbar region: Secondary | ICD-10-CM | POA: Diagnosis not present

## 2018-05-09 DIAGNOSIS — M545 Low back pain: Secondary | ICD-10-CM

## 2018-05-09 DIAGNOSIS — M79604 Pain in right leg: Secondary | ICD-10-CM

## 2018-05-09 DIAGNOSIS — R29898 Other symptoms and signs involving the musculoskeletal system: Secondary | ICD-10-CM | POA: Diagnosis not present

## 2018-05-09 MED ORDER — GABAPENTIN 300 MG PO CAPS: ORAL_CAPSULE | ORAL | 3 refills | Status: DC

## 2018-05-09 MED ORDER — PREDNISONE 5 MG (48) PO TBPK: ORAL_TABLET | ORAL | 0 refills | Status: DC

## 2018-05-09 MED ORDER — AMBULATORY NON FORMULARY MEDICATION: 0 refills | Status: DC

## 2018-05-09 NOTE — Patient Instructions (Addendum)
Thank you for coming in today. Take gabapentin for pain as needed.  Increase up to 3x daily. Start at night.  Fill and take prednisone if not improving.  You should hear about MRI soon.  Let me know if you do not hear anything.  Plan to recheck with me the day following the MRI.  Use a walker to get around.   Dorthula Perfect [phone] [address] [place], [place] [address]   Try stopping Crestor (Rousevastatin) for a week and then restarting.     Radicular Pain Radicular pain is a type of pain that spreads from your back or neck along a spinal nerve. Spinal nerves are nerves that leave the spinal cord and go to the muscles. Radicular pain is sometimes called radiculopathy, radiculitis, or a pinched nerve. When you have this type of pain, you may also have weakness, numbness, or tingling in the area of your body that is supplied by the nerve. The pain may feel sharp and burning. Depending on which spinal nerve is affected, the pain may occur in the:  Neck area (cervical radicular pain). You may also feel pain, numbness, weakness, or tingling in the arms.  Mid-spine area (thoracic radicular pain). You would feel this pain in the back and chest. This type is rare.  Lower back area (lumbar radicular pain). You would feel this pain as low back pain. You may feel pain, numbness, weakness, or tingling in the buttocks or legs. Sciatica is a type of lumbar radicular pain that shoots down the back of the leg. Radicular pain occurs when one of the spinal nerves becomes irritated or squeezed (compressed). It is often caused by something pushing on a spinal nerve, such as one of the bones of the spine (vertebrae) or one of the round cushions between vertebrae (intervertebral disks). This can result from:  An injury.  Wear and tear or aging of a disk.  The growth of a bone spur that pushes on the nerve. Radicular pain often goes away when you follow instructions from your  health care provider for relieving pain at home. Follow these instructions at home: Managing pain      If directed, put ice on the affected area: ? Put ice in a plastic bag. ? Place a towel between your skin and the bag. ? Leave the ice on for 20 minutes, 2-3 times a day.  If directed, apply heat to the affected area as often as told by your health care provider. Use the heat source that your health care provider recommends, such as a moist heat pack or a heating pad. ? Place a towel between your skin and the heat source. ? Leave the heat on for 20-30 minutes. ? Remove the heat if your skin turns bright red. This is especially important if you are unable to feel pain, heat, or cold. You may have a greater risk of getting burned. Activity   Do not sit or rest in bed for long periods of time.  Try to stay as active as possible. Ask your health care provider what type of exercise or activity is best for you.  Avoid activities that make your pain worse, such as bending and lifting.  Do not lift anything that is heavier than 10 lb (4.5 kg), or the limit that you are told, until your health care provider says that it is safe.  Practice using proper technique when lifting items. Proper lifting technique involves bending your knees and rising up.  Do strength and range-of-motion exercises only as told by your health care provider or physical therapist. General instructions  Take over-the-counter and prescription medicines only as told by your health care provider.  Pay attention to any changes in your symptoms.  Keep all follow-up visits as told by your health care provider. This is important. ? Your health care provider may send you to a physical therapist to help with this pain. Contact a health care provider if:  Your pain and other symptoms get worse.  Your pain medicine is not helping.  Your pain has not improved after a few weeks of home care.  You have a fever. Get help  right away if:  You have severe pain, weakness, or numbness.  You have difficulty with bladder or bowel control. Summary  Radicular pain is a type of pain that spreads from your back or neck along a spinal nerve.  When you have radicular pain, you may also have weakness, numbness, or tingling in the area of your body that is supplied by the nerve.  The pain may feel sharp or burning.  Radicular pain may be treated with ice, heat, medicines, or physical therapy. This information is not intended to replace advice given to you by your health care provider. Make sure you discuss any questions you have with your health care provider. Document Released: 05/21/2004 Document Revised: 10/26/2017 Document Reviewed: 10/26/2017 Elsevier Interactive Patient Education  2019 [employer].

## 2018-05-09 NOTE — Progress Notes (Signed)
Joann Maddox is a 78 y.o. female who presents to [name]: Primary Care Sports Medicine today for right leg pain.  Joann Maddox has a recent history of bilateral thigh pain right worse than left.  This is been ongoing now for about 2 weeks or so.  Is been worsening significantly over the last week however.  She was seen by her primary care provider on January 7 who was suspicious for polymyalgia rheumatica.  She had a metabolic work-up and thankfully her sed rate was normal as were the remainder of her labs with the exception of very minimally elevated CK.  She notes anterior to medial right thigh pain and some into the left thigh.  Pain is worse with ambulation and better with rest.  She also notes some pain getting in and out of her car.  She has difficulty bending forward to put her shoes and socks on as well.  She denies any recent injury.  She denies any recent new medications.  She has been taking her rosuvastatin for some time now.  She denies fevers or chills vomiting or diarrhea.  She notes the pain is moderate to severe and is interfering with her ability to ambulate normally.  She is using a cane to ambulate.  She thinks she may have a walker at home that she used in the past following her total knees and hip replacements in the past.  She notes that she has a history of right total hip replacement occurring about 10 years ago.  ROS as above:  Exam:  BP (!) 142/76   Pulse 83   Wt 209 lb (94.8 kg)   BMI 38.23 kg/m  Wt Readings from Last 5 Encounters:  05/09/18 209 lb (94.8 kg)  05/03/18 205 lb (93 kg)  04/01/18 205 lb (93 kg)  03/31/18 205 lb (93 kg)  03/11/18 205 lb (93 kg)    Gen: Well NAD HEENT: EOMI,  MMM Lungs: Normal work of breathing. CTABL Heart: RRR no MRG Abd: NABS, Soft. Nondistended, Nontender Exts: Brisk capillary refill, warm and well perfused.  L-spine nontender to spinal  midline. Tender palpation bilateral lumbar paraspinal musculature mildly. Decreased lumbar motion.  Right hip normal-appearing Range of motion limited internal rotation and flexion by pain. Strength diminished hip flexion 4/5.  Normal strength hip abduction and adduction. Left hip: Normal-appearing normal motion. Normal strength to flexion abduction adduction  Right knee normal-appearing nontender normal motion.  Knee extension strength slightly diminished 4/5. Left knee normal-appearing nontender normal motion knee extension strength intact.  Pulses cap refill and sensation are intact.  Lab and Radiology Results    X-ray images right hip and pelvis, right femur, L-spine personally independently reviewed  L-spine: Diffuse degenerative changes throughout.  No acute fractures present.    Right hip and pelvis: Intact appearing surgical hardware status post bilateral total hip replacement.  No periprosthetic fractures visible.  No bony tumors visible.  Right femur: Intact right hip and right knee total replacement hardware.  No periprosthetic fractures.  Await formal radiology review    Assessment and Plan: 78 y.o. female with  Right thigh pain associated with some weakness of hip flexion and knee extension.  Patient does have some left leg symptoms as well but they are much less prominent.  Based on her previous work-up with Dr. Linford Arnold as well as imaging and physical exam I think her pain is likely from right L3 nerve root impingement.  She has significant  degenerative changes in her lumbar spine that could likely account for nerve impingement.  Given her weakness and progressive symptoms I think the next option is MRI to further characterize her symptomology.  As she is having difficulty ambulating will prescribe a rolling walker as well.  For temporary control of symptoms will prescribe gabapentin and a backup course of prednisone if worsening or not improving.  Recheck shortly after  MRI or in the next few weeks if needed.   I spent 40 minutes with this patient, greater than 50% was face-to-face time counseling regarding ddx and plan.  PDMP not reviewed this encounter. Orders Placed This Encounter  Procedures  . DG HIP UNILAT WITH PELVIS 2-3 VIEWS RIGHT    Standing Status:   Future    Number of Occurrences:   1    Standing Expiration Date:   07/08/2019    Order Specific Question:   Reason for Exam (SYMPTOM  OR DIAGNOSIS REQUIRED)    Answer:   eval pain right thigh and hip    Order Specific Question:   Pref[facility] imaging [facility]?    [facility]    Order Specific Question:   Radiology Contrast Protocol - do NOT remove file path    Answer:   \\[email or web address]\[email or web address]\Radiant\DXFluoroContrastProtocols.pdf  . DG FEMUR, MIN 2 VIEWS RIGHT    Standing Status:   Future    Number of Occurrences:   1    Standing Expiration Date:   07/08/2019    Order Specific Question:   Reason for Exam (SYMPTOM  OR DIAGNOSIS REQUIRED)    Answer:   eval right thight pain    Order Specific Question:   Preferred imaging location?    Answer[facility]    Order Specific Question:   Radiology Contrast Protocol - do NOT remove file path    Answer:   \\[email or web address]\epicdata\Radiant\DXFluoroContrastProtocols.pdf  . DG Lumbar Spine Complete    Standing Status:   Future    Number of Occurrences:   1    Standing Expiration Date:   07/08/2019    Order Specific Question:   Reason for Exam (SYMPTOM  OR DIAGNOSIS REQUIRED)    Answer:   eval pain right thigh. ?L2 or L3 nerve root    Order Specific Question:   Preferred imaging location?    Answer[facility]    Order Specific Question:   Radiology Contrast Protocol - do NOT remove file path    Answer:   \\[email or web address]\epicdata\Radiant\DXFluoroContrastProtocols.pdf  . MR Lumbar Spine Wo Contrast    Standing Status:   Future    Standing Expiration Date:   07/08/2019    Order Specific Question:   What is the  patient's sedation requirement?    Answer:   No Sedation    Order Specific Question:   Does the patient have a pacemaker or implanted devices?    Answer:   No    Order Specific Question:   Preferred imaging location?    Answer:   [facility]  (table limit-350lbs)    Order Specific Question:   Radiology Contrast Protocol - do NOT remove file path    Answer:   \\charchive\epicdata\Radiant\mriPROTOCOL.PDF   Meds ordered this encounter  Medications  . predniSONE (STERAPRED UNI-PAK 48 TAB) 5 MG (48) TBPK tablet    Sig: 12 day dosepack po    Dispense:  48 tablet    Refill:  0  . gabapentin (NEURONTIN) 300 MG capsule    Sig: One tab PO qHS  for a week, then BID for a week, then TID. May double weekly to a max of 3,600mg /day    Dispense:  180 capsule    Refill:  3  . AMBULATORY NON FORMULARY MEDICATION    Sig: Walker. Use a needed for leg weakness.  Disp 1 R29.898    Dispense:  1 each    Refill:  0     Historical information moved to improve visibility of documentation.  Past Medical History:  Diagnosis Date  . Anxiety   . Arthritis   . Depression   . Headache(784.0)    migrains  . Heart murmur   . Hypothyroidism   . Pre-diabetes   . Rheumatoid arthritis(714.0)    Past Surgical History:  Procedure Laterality Date  . ABDOMINAL HYSTERECTOMY    . ANTERIOR CERVICAL DECOMP/DISCECTOMY FUSION N/A 01/03/2018   Procedure: ANTERIOR CERVICAL DECOMPRESSION/DISCECTOMY FUSION, ALLOGRAFT, PLATE;  Surgeon: Eldred Manges, MD;  Location: [facility] OR;  Service: Orthopedics;  Laterality: N/A;  . APPENDECTOMY    . CHOLECYSTECTOMY    . REPLACEMENT TOTAL KNEE BILATERAL    . TONSILLECTOMY     as  a child  . TOTAL HIP ARTHROPLASTY     right and left   Social History   Tobacco Use  . Smoking status: Never Smoker  . Smokeless tobacco: Never Used  Substance Use Topics  . Alcohol use: No   family history includes Diabetes in her unknown relative; Heart disease (age of onset: 66) in her  father; Stroke (age of onset: 105) in her mother.  Medications: Current Outpatient Medications  Medication Sig Dispense Refill  . azelaic acid (AZELEX) 20 % cream APPLY TOPICALL 2 TIMES A DAY (MORNING AND EVENING). APPLY AFTER SKIN IS WASHED AND PATTED DRY. 30 g 1  . butalbital-acetaminophen-caffeine (FIORICET, ESGIC) 50-325-40 MG tablet Take 1 tablet by mouth 2 (two) times daily as needed. (Patient taking differently: Take 1 tablet by mouth 2 (two) times daily as needed for migraine. ) 14 tablet 5  . DULoxetine (CYMBALTA) 60 MG capsule Take 1 capsule (60 mg total) by mouth daily. 90 capsule 3  . folic acid (FOLVITE) 1 MG tablet TAKE 1 TABLET BY MOUTH EVERY DAY (Patient taking differently: Take 1 mg by mouth daily. ) 90 tablet 2  . furosemide (LASIX) 20 MG tablet TAKE 1 TABLET BY MOUTH EVERY DAY AS NEEDED (Patient taking differently: Take 20 mg by mouth daily as needed for fluid. ) 90 tablet 1  . hydroxychloroquine (PLAQUENIL) 200 MG tablet 2 TABLET WITH FOOD OR MILK ONCE A DAY ORALLY 60 tablet 1  . levothyroxine (SYNTHROID, LEVOTHROID) 25 MCG tablet Take 1 tablet (25 mcg total) by mouth daily. 90 tablet 3  . lisinopril (PRINIVIL,ZESTRIL) 10 MG tablet Take 1 tablet (10 mg total) by mouth daily. 90 tablet 1  . meloxicam (MOBIC) 15 MG tablet TAKE ONE TABLET BY MOUTH EACH AM WITH BREAKFAST AS NEEDED FOR PAIN 30 tablet 2  . metFORMIN (GLUCOPHAGE) 500 MG tablet TAKE 1 TABLET BY MOUTH 2 TIMES DAILY WITH A MEAL. (Patient taking differently: Take 500 mg by mouth daily. ) 180 tablet 1  . mirabegron ER (MYRBETRIQ) 25 MG TB24 tablet Take 1 tablet (25 mg total) by mouth at bedtime. 90 tablet 1  . Multiple Vitamin (MULTIVITAMIN) tablet Take 1 tablet by mouth daily.    Marland Kitchen nystatin (MYCOSTATIN/NYSTOP) powder APPLY 1 GRAM TOPICALLY 2 (TWO) TIMES DAILY. X 3 WEEKS. 60 g 1  . Omega-3 Fatty Acids (FISH OIL) 1200  MG CAPS Take 1,200 mg by mouth 2 (two) times a week. WITH OMEGA-3 360 MG    . potassium chloride (K-DUR) 10  MEQ tablet TAKE 1 TABLET (10 MEQ TOTAL) BY MOUTH 2 (TWO) TIMES DAILY. 60 tablet 11  . rosuvastatin (CRESTOR) 10 MG tablet Take 1 tablet (10 mg total) by mouth at bedtime. 90 tablet 3  . senna (SENOKOT) 8.6 MG tablet Take 1 tablet by mouth at bedtime.     . traMADol (ULTRAM) 50 MG tablet TAKE 1-2 TABS BY MOUTH EVERY 12 HOURS AS NEEDED FOR PAIN 120 tablet 1  . TURMERIC PO Take 1,000 mg by mouth daily.    . vitamin B-12 (CYANOCOBALAMIN) 100 MCG tablet Take 100 mcg by mouth at bedtime.     . Vitamin D, Ergocalciferol, (DRISDOL) 50000 units CAPS capsule TAKE 1 CAPSULE (50,000 UNITS TOTAL) BY MOUTH ONCE A WEEK. (Patient taking differently: Take 50,000 Units by mouth every Sunday. ) 12 capsule 2  . AMBULATORY NON FORMULARY MEDICATION Walker. Use a needed for leg weakness.  Disp 1 R29.898 1 each 0  . gabapentin (NEURONTIN) 300 MG capsule One tab PO qHS for a week, then BID for a week, then TID. May double weekly to a max of 3,600mg /day 180 capsule 3  . predniSONE (STERAPRED UNI-PAK 48 TAB) 5 MG (48) TBPK tablet 12 day dosepack po 48 tablet 0   No current facility-administered medications for this visit.    Allergies  Allergen Reactions  . Iodine Other (See Comments)    Shook violently and passed out.  . Codeine Other (See Comments)    Hallucinations  . Lipitor [Atorvastatin] Other (See Comments)    Myalgias     Discussed warning signs or symptoms. Please see discharge instructions. Patient expresses understanding.

## 2018-05-10 ENCOUNTER — Telehealth (INDEPENDENT_AMBULATORY_CARE_PROVIDER_SITE_OTHER): Payer: Self-pay | Admitting: Orthopaedic Surgery

## 2018-05-10 NOTE — Telephone Encounter (Signed)
fyi

## 2018-05-10 NOTE — Telephone Encounter (Signed)
Patient called to say that she is not quite ready for surgery yet.  Currently seeing a doctor that is treating her for arthritis since its become difficult for her to walk.  She will call and scheduled once she gets gets some relief for her back.

## 2018-05-16 ENCOUNTER — Ambulatory Visit (INDEPENDENT_AMBULATORY_CARE_PROVIDER_SITE_OTHER): Payer: Medicare Other

## 2018-05-16 ENCOUNTER — Other Ambulatory Visit: Payer: Self-pay | Admitting: Emergency Medicine

## 2018-05-16 ENCOUNTER — Telehealth: Payer: Self-pay | Admitting: Sports Medicine

## 2018-05-16 ENCOUNTER — Other Ambulatory Visit: Payer: Self-pay | Admitting: Family Medicine

## 2018-05-16 ENCOUNTER — Emergency Department (INDEPENDENT_AMBULATORY_CARE_PROVIDER_SITE_OTHER)
Admission: EM | Admit: 2018-05-16 | Discharge: 2018-05-16 | Disposition: A | Discharge status: Home / Self Care | Payer: Medicare Other | Source: Home / Self Care | Attending: Emergency Medicine | Admitting: Emergency Medicine

## 2018-05-16 ENCOUNTER — Other Ambulatory Visit: Payer: Self-pay

## 2018-05-16 ENCOUNTER — Telehealth: Payer: Self-pay | Admitting: Family Medicine

## 2018-05-16 ENCOUNTER — Other Ambulatory Visit (HOSPITAL_BASED_OUTPATIENT_CLINIC_OR_DEPARTMENT_OTHER): Payer: Self-pay | Admitting: Emergency Medicine

## 2018-05-16 ENCOUNTER — Telehealth: Payer: Self-pay

## 2018-05-16 ENCOUNTER — Other Ambulatory Visit: Payer: Self-pay | Admitting: *Deleted

## 2018-05-16 ENCOUNTER — Ambulatory Visit (HOSPITAL_BASED_OUTPATIENT_CLINIC_OR_DEPARTMENT_OTHER)
Admission: RE | Admit: 2018-05-16 | Discharge: 2018-05-16 | Disposition: A | Discharge status: Home / Self Care | Payer: Medicare Other | Source: Ambulatory Visit | Attending: Family Medicine | Admitting: Family Medicine

## 2018-05-16 ENCOUNTER — Ambulatory Visit (HOSPITAL_BASED_OUTPATIENT_CLINIC_OR_DEPARTMENT_OTHER)
Admission: RE | Admit: 2018-05-16 | Discharge: 2018-05-16 | Disposition: A | Discharge status: Home / Self Care | Payer: Medicare Other | Source: Ambulatory Visit | Attending: Emergency Medicine | Admitting: Emergency Medicine

## 2018-05-16 DIAGNOSIS — M79604 Pain in right leg: Secondary | ICD-10-CM

## 2018-05-16 DIAGNOSIS — M5136 Other intervertebral disc degeneration, lumbar region: Secondary | ICD-10-CM

## 2018-05-16 DIAGNOSIS — M7989 Other specified soft tissue disorders: Secondary | ICD-10-CM

## 2018-05-16 DIAGNOSIS — M461 Sacroiliitis, not elsewhere classified: Secondary | ICD-10-CM

## 2018-05-16 DIAGNOSIS — D62 Acute posthemorrhagic anemia: Secondary | ICD-10-CM

## 2018-05-16 DIAGNOSIS — M48061 Spinal stenosis, lumbar region without neurogenic claudication: Secondary | ICD-10-CM | POA: Diagnosis not present

## 2018-05-16 DIAGNOSIS — S8011XS Contusion of right lower leg, sequela: Secondary | ICD-10-CM

## 2018-05-16 DIAGNOSIS — M25551 Pain in right hip: Secondary | ICD-10-CM | POA: Diagnosis present

## 2018-05-16 DIAGNOSIS — T148XXA Other injury of unspecified body region, initial encounter: Secondary | ICD-10-CM

## 2018-05-16 DIAGNOSIS — M4326 Fusion of spine, lumbar region: Secondary | ICD-10-CM

## 2018-05-16 DIAGNOSIS — M51369 Other intervertebral disc degeneration, lumbar region without mention of lumbar back pain or lower extremity pain: Secondary | ICD-10-CM

## 2018-05-16 DIAGNOSIS — R29898 Other symptoms and signs involving the musculoskeletal system: Secondary | ICD-10-CM

## 2018-05-16 LAB — POCT CBC W AUTO DIFF (K'VILLE URGENT CARE)

## 2018-05-16 LAB — APTT: aPTT: 30 s (ref 22–34)

## 2018-05-16 LAB — PROTIME-INR
INR: 1
Prothrombin Time: 10.6 s (ref 9.0–11.5)

## 2018-05-16 NOTE — Addendum Note (Signed)
Addended by: Rodolph Bong on: 05/16/2018 01:52 PM   Modules accepted: Orders

## 2018-05-16 NOTE — Telephone Encounter (Signed)
Left VM for [name] at [facility] advising of order.

## 2018-05-16 NOTE — Telephone Encounter (Signed)
Attempted to add US venous Right LE.  Order from Dr. Cleta Albertsaub is already in place. Contacted [facility] who confirmed order already in place.

## 2018-05-16 NOTE — ED Provider Notes (Signed)
Reviewed imaging for Dr. Cleta Alberts on this pt.  Venous ultrasound was negative. Pt will be notified and advised to f/u with PCP for continued workup as discussed with  Dr. Cleta Alberts.    Lurene Shadow, [place] 05/16/18 1650

## 2018-05-16 NOTE — Telephone Encounter (Signed)
Epidural steroid injection ordered.  Pt has follow up scheduled with Dr T on 1/21. He can modify order if needed.  Please contact [place] [facility].

## 2018-05-16 NOTE — ED Triage Notes (Signed)
Pt was seen by Dr Denyse Amass last Monday for right leg pain.  Imaging done on Monday.  Woke up Tuesday am with bruising down the right leg.  Had MRI this am, and wants to be seen due to swelling and bruising.

## 2018-05-16 NOTE — ED Provider Notes (Signed)
[facility]    CSN: [id number] Arrival date & time: 05/16/18  [id number]     History   Chief Complaint Chief Complaint  Patient presents with  . Leg Pain    HPI Joann Maddox is a 78 y.o. female.    Leg Pain  Patient states she began having pain in the back of her right leg last Monday.  She denied injury to her leg.  Since that time she has had significant pain with difficulty walking.  Her pain became severe on Friday and Saturday and she considered a visit to the ER.  Yesterday and today her pain has been somewhat better.  She has developed ecchymoses which starts in the upper portion of her right hamstring which extends down to her ankle.  She denies being on a blood thinner and again denies any trauma to her leg.  She was seen last week by Dr. Denyse Amassorey. x-rays were done of the femur, and of the back.  MRI was done today.  This was done of the lumbar spine.  Past Medical History:  Diagnosis Date  . Anxiety   . Arthritis   . Depression   . Headache(784.0)    migrains  . Heart murmur   . Hypothyroidism   . Pre-diabetes   . Rheumatoid arthritis(714.0)     Patient Active Problem List   Diagnosis Date Noted  . 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 Status post cervical spinal fusion 01/11/2018  . Neck pain 10/20/2017  . Chronic venous insufficiency 04/29/2017  . 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HCC) 02/12/2015  . Type 2 diabetes mellitus without complication (HCC) 08/10/2014  . Chronic constipation 02/19/2014  . Rosacea 11/06/2013  . GAD (generalized anxiety disorder) 06/08/2013  . Essential hypertension, benign 05/04/2013  . Migraine with aura 05/04/2013  . Palpitations 05/04/2013  . Murmur, heart 05/04/2013  . Hyperlipidemia 05/04/2013  . Hypothyroidism 05/04/2013  . Fatigue 02/16/2012  . Obesity, morbid (HCC) 02/16/2012  . Urge incontinence 02/16/2012  . Hyperglycemia  01/12/2012    Past Surgical History:  Procedure Laterality Date  . ABDOMINAL HYSTERECTOMY    . ANTERIOR CERVICAL DECOMP/DISCECTOMY FUSION N/A 01/03/2018   Procedure: ANTERIOR CERVICAL DECOMPRESSION/DISCECTOMY FUSION, ALLOGRAFT, PLATE;  Surgeon: Eldred MangesYates, Mark C, MD;  Location: [facility] OR;  Service: Orthopedics;  Laterality: N/A;  . APPENDECTOMY    . CHOLECYSTECTOMY    . REPLACEMENT TOTAL KNEE BILATERAL    . TONSILLECTOMY     as  a child  . TOTAL HIP ARTHROPLASTY     right and left    OB History   No obstetric history on file.      Home Medications    Prior to Admission medications   Medication Sig Start Date End Date Taking? Authorizing Provider  AMBULATORY NON FORMULARY MEDICATION Walker. Use a needed for leg weakness.  Disp 1 R29.898 05/09/18   Rodolph Bongorey, Evan S, MD  azelaic acid (AZELEX) 20 % cream APPLY TOPICALL 2 TIMES A DAY (MORNING AND EVENING). APPLY AFTER SKIN IS WASHED AND PATTED DRY. 05/03/18   Agapito GamesMetheney, Catherine D, MD  butalbital-acetaminophen-caffeine (FIORICET, ESGIC) [phone] [id number] MG tablet Take 1 tablet by mouth 2 (two) times daily as needed. Patient taking differently: Take 1 tablet by mouth 2 (two) times daily as needed for migraine.  11/29/17   Agapito GamesMetheney, Catherine D, MD  DULoxetine (CYMBALTA) 60 MG capsule Take 1 capsule (60 mg total) by mouth daily. 03/31/18   Metheney,  Barbarann Ehlers, MD  folic acid (FOLVITE) 1 MG tablet TAKE 1 TABLET BY MOUTH EVERY DAY Patient taking differently: Take 1 mg by mouth daily.  08/22/14   Agapito Games, MD  furosemide (LASIX) 20 MG tablet TAKE 1 TABLET BY MOUTH EVERY DAY AS NEEDED Patient taking differently: Take 20 mg by mouth daily as needed for fluid.  11/01/17   Agapito Games, MD  gabapentin (NEURONTIN) 300 MG capsule One tab PO qHS for a week, then BID for a week, then TID. May double weekly to a max of 3,600mg /day 05/09/18   Rodolph Bong, MD  hydroxychloroquine (PLAQUENIL) 200 MG tablet 2 TABLET WITH FOOD OR MILK ONCE A DAY ORALLY  04/06/18   Agapito Games, MD  levothyroxine (SYNTHROID, LEVOTHROID) 25 MCG tablet Take 1 tablet (25 mcg total) by mouth daily. 03/31/18   Agapito Games, MD  lisinopril (PRINIVIL,ZESTRIL) 10 MG tablet Take 1 tablet (10 mg total) by mouth daily. 05/03/18   Agapito Games, MD  meloxicam (MOBIC) 15 MG tablet TAKE ONE TABLET BY MOUTH EACH AM WITH BREAKFAST AS NEEDED FOR PAIN 01/24/18   Monica Becton, MD  metFORMIN (GLUCOPHAGE) 500 MG tablet TAKE 1 TABLET BY MOUTH 2 TIMES DAILY WITH A MEAL. Patient taking differently: Take 500 mg by mouth daily.  10/26/17   Agapito Games, MD  mirabegron ER (MYRBETRIQ) 25 MG TB24 tablet Take 1 tablet (25 mg total) by mouth at bedtime. 01/24/18   Agapito Games, MD  Multiple Vitamin (MULTIVITAMIN) tablet Take 1 tablet by mouth daily.    [provider]  nystatin (MYCOSTATIN/NYSTOP) powder APPLY 1 GRAM TOPICALLY 2 (TWO) TIMES DAILY. X 3 WEEKS. 05/03/18   Agapito Games, MD  Omega-3 Fatty Acids (FISH OIL) 1200 MG CAPS Take 1,200 mg by mouth 2 (two) times a week. WITH OMEGA-3 360 MG    [provider]  potassium chloride (K-DUR) 10 MEQ tablet TAKE 1 TABLET (10 MEQ TOTAL) BY MOUTH 2 (TWO) TIMES DAILY. 03/14/18   Agapito Games, MD  predniSONE (STERAPRED UNI-PAK 48 TAB) 5 MG (48) TBPK tablet 12 day dosepack po 05/09/18   Rodolph Bong, MD  rosuvastatin (CRESTOR) 10 MG tablet Take 1 tablet (10 mg total) by mouth at bedtime. 05/02/18   Agapito Games, MD  senna (SENOKOT) 8.6 MG tablet Take 1 tablet by mouth at bedtime.     [provider]  traMADol (ULTRAM) 50 MG tablet TAKE 1-2 TABS BY MOUTH EVERY 12 HOURS AS NEEDED FOR PAIN 04/04/18   Agapito Games, MD  TURMERIC PO Take 1,000 mg by mouth daily.    [provider]  vitamin B-12 (CYANOCOBALAMIN) 100 MCG tablet Take 100 mcg by mouth at bedtime.     [provider]  Vitamin D, Ergocalciferol, (DRISDOL) 50000 units CAPS capsule  TAKE 1 CAPSULE (50,000 UNITS TOTAL) BY MOUTH ONCE A WEEK. Patient taking differently: Take 50,000 Units by mouth every Sunday.  11/29/17   Agapito Games, MD    Family History Family History  Problem Relation Age of Onset  . Heart disease Father 39  . Diabetes Other        aunt   . Stroke Mother 47    Social History Social History   Tobacco Use  . Smoking status: Never Smoker  . Smokeless tobacco: Never Used  Substance Use Topics  . Alcohol use: No  . Drug use: Not on file     Allergies  Iodine; Codeine; and Lipitor [atorvastatin]   Review of Systems Review of Systems  Constitutional: Negative.   HENT: Negative.   Respiratory: Negative.   Cardiovascular: Negative.   Musculoskeletal:       Patient describes pain, swelling, and ecchymoses involving the back of her right leg down to the ankle.        Physical Exam Triage Vital Signs ED Triage Vitals  Enc Vitals Group     BP 05/16/18 1008 134/71     Pulse Rate 05/16/18 1008 81     Resp 05/16/18 1008 18     Temp 05/16/18 1008 97.8 F (36.6 C)     Temp Source 05/16/18 1008 Oral     SpO2 05/16/18 1008 97 %     Weight 05/16/18 1009 205 lb (93 kg)     Height 05/16/18 1009 5' (1.524 m)     Head Circumference --      Peak Flow --      Pain Score 05/16/18 1008 8     Pain Loc --      Pain Edu? --      Excl. in GC? --      No data found.  Updated Vital Signs BP 134/71 (BP Location: Right Arm)   Pulse 81   Temp 97.8 F (36.6 C) (Oral)   Resp 18   Ht 5' (1.524 m)   Wt 93 kg   SpO2 97%   BMI 40.04 kg/m   Visual Acuity Right Eye Distance:   Left Eye Distance:   Bilateral Distance:    Right Eye Near:   Left Eye Near:    Bilateral Near:     Physical Exam Constitutional:      Appearance: Normal appearance.  Neck:     Musculoskeletal: Normal range of motion.  Cardiovascular:     Rate and Rhythm: Normal rate and regular rhythm.     Pulses: Normal pulses.  Neurological:     Mental Status:  She is alert.        Patient has severe pain in the thigh and calf of her right leg.  She has developed significant ecchymoses involving the right leg primarily posteriorly but extending down the back of the upper right leg through the calf and into the right ankle.  She does have sensation in her toes.  Dorsalis pedis pulse is 2+.    UC Treatments / Results  Labs (all labs ordered are listed, but only abnormal results are displayed) Labs Reviewed  APTT  PROTIME-INR  POCT CBC W AUTO DIFF ([facility])    EKG None  Radiology Mr Lumbar Spine Wo Contrast  Result Date: 05/16/2018 CLINICAL DATA:  Right leg pain and weakness. Leg swelling and discoloration. EXAM: MRI LUMBAR SPINE WITHOUT CONTRAST TECHNIQUE: Multiplanar, multisequence MR imaging of the lumbar spine was performed. No intravenous contrast was administered. COMPARISON:  Lumbar spine radiographs 05/09/2018 FINDINGS: Segmentation:  Standard. Alignment: Grade 1 anterolisthesis of L3 on L4, L4 on L5, and L5 on S1. Vertebrae: No fracture, suspicious osseous lesion, or significant marrow edema. Advanced lumbar facet arthrosis with facet ankylosis bilaterally at L3-4 and L4-5 and also possibly bilaterally at L5-S1. Conus medullaris and cauda equina: Conus extends to the T12-L1 level. Conus and cauda equina appear normal. Paraspinal and other soft tissues: Unremarkable. Disc levels: Disc desiccation throughout the lumbar spine. Mild disc space narrowing at L2-3 with moderate narrowing from L3-4 to L5-S1. T11-12: Only imaged sagittally. Small right paracentral disc protrusion and moderate right  and mild left facet arthrosis without stenosis. T12-L1: Minimal disc bulging and moderate to severe facet arthrosis without stenosis. L1-2: Minimal disc bulging and severe facet arthrosis without stenosis. L2-3: Mild disc bulging greater to the right and severe facet arthrosis without stenosis. L3-4: Anterolisthesis with slight bulging of uncovered  disc and severe facet arthrosis result in mild spinal stenosis without neural foraminal stenosis. L4-5: Anterolisthesis with disc uncovering and severe facet arthrosis result in mild left greater than right lateral recess stenosis without significant generalized spinal stenosis or neural foraminal stenosis. L5-S1: Anterolisthesis with disc uncovering and severe facet arthrosis without stenosis. IMPRESSION: 1. 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 2. Mild disc degeneration and multilevel anterolisthesis. 3. Mild spinal stenosis at L3-4. 4. Mild lateral recess stenosis at L4-5. Electronically Signed   By: Sebastian AcheAllen  Grady M.D.   On: 05/16/2018 09:50    Procedures Procedures (including critical care time)  Medications Ordered in UC Medications - No data to display  Initial Impression / Assessment and Plan / UC Course  I have reviewed the triage vital signs and the nursing notes.  Pertinent labs & imaging results that were available during my care of the patient were reviewed by me and considered in my medical decision making (see chart for details). I was able to discuss the situation with Dr. Linford ArnoldMetheney.  Patient's hemoglobin on 03/31/2018 was 11.5.  Today her hemoglobin is 8.9.  She is scheduled for an ultrasound this afternoon.  She will also have a CT of her hip and femur.  She will follow-up with Dr. Linford ArnoldMetheney once these tests are completed for further management.  She understands to rest and keep her leg elevated for now.  I have ordered the ultrasound and Dr. Linford ArnoldMetheney has ordered the CT     Final Clinical Impressions(s) / UC Diagnoses   Final diagnoses:  Right leg pain  Right leg swelling  Leg hematoma, right, sequela  Acute blood loss anemia     Discharge Instructions     Please return to clinic in x-ray for your ultrasound and CT.    ED Prescriptions    None     Controlled Substance Prescriptions [place] Controlled Substance Registry consulted? Not  Applicable   Collene Gobbleaub, Lacara Dunsworth A, MD 05/16/18 1143

## 2018-05-16 NOTE — Telephone Encounter (Signed)
Patient just had her MRI done and is up here asking questions about it.  Official results are not back yet but I personally reviewed the MRI images and the dominant finding when taking in account her right leg pain is a far lateral disc protrusion at L2-L3 that appears to contact the extraforaminal right L3 nerve root.  I think this is the cause of her pain and I do think she would be a candidate if clinically appropriate for a right L3 selective nerve root block.  I did advise her that Dr. Denyse Amass would review the MRI and contact her with the plan.

## 2018-05-16 NOTE — Discharge Instructions (Signed)
Please return to clinic in x-ray for your ultrasound and CT.

## 2018-05-17 ENCOUNTER — Ambulatory Visit (INDEPENDENT_AMBULATORY_CARE_PROVIDER_SITE_OTHER): Payer: Medicare Other | Admitting: Sports Medicine

## 2018-05-17 ENCOUNTER — Encounter: Payer: Self-pay | Admitting: Sports Medicine

## 2018-05-17 VITALS — BP 137/81 | HR 96

## 2018-05-17 DIAGNOSIS — D649 Anemia, unspecified: Secondary | ICD-10-CM

## 2018-05-17 DIAGNOSIS — R2241 Localized swelling, mass and lump, right lower limb: Secondary | ICD-10-CM | POA: Insufficient documentation

## 2018-05-17 LAB — POCT HEMOGLOBIN: Hemoglobin: 8.5 g/dL — AB (ref 11–14.6)

## 2018-05-17 MED ORDER — HYDROCODONE-ACETAMINOPHEN 5-325 MG PO TABS: 1.0000 | ORAL_TABLET | Freq: Three times a day (TID) | ORAL | 0 refills | Status: DC | PRN

## 2018-05-17 MED ORDER — DOCUSATE SODIUM 100 MG PO CAPS: 100.0000 mg | ORAL_CAPSULE | Freq: Three times a day (TID) | ORAL | 3 refills | Status: DC | PRN

## 2018-05-17 NOTE — Assessment & Plan Note (Signed)
Question large hematoma versus intramuscular tumor. She has dropped her hemoglobin over 3 points. 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 Discussed case with radiology. Checking her CBC, CMP, anemia panels today. If it does look like iron deficiency anemia I will probably go ahead and get upper and lower endoscopy as well. She does endorse significant constipation but no melena or hematochezia. Adding some hydrocodone for pain, Colace to soften her stools.

## 2018-05-17 NOTE — Progress Notes (Signed)
Subjective:    CC: Leg pain  HPI: This is a pleasant 78 year old female, for the past several months she is had increasing pain in her right leg, no melena, hematochezia.  More recently she noted severe pain, she was seen in urgent care, she had severe bruising down her right leg.  A CBC at that time showed some anemia and a drop from her baseline levels of hemoglobin.  Ultimately she had continued pain so a CT was done of her thigh that showed some enlargement of the abductor magnus muscle.  The suspicion was intramuscular hematoma, but it is difficult to exclude a mass with a noncontrast CT.  She denies any fevers, chills, night sweats, weight loss.  Pain is not overtly radicular.  She localizes it in the posterior and medial mid thigh.  I reviewed the past medical history, family history, social history, surgical history, and allergies today and no changes were needed.  Please see the problem list section below in epic for further details.  Past Medical History: Past Medical History:  Diagnosis Date  . Anxiety   . Arthritis   . Depression   . Headache(784.0)    migrains  . Heart murmur   . Hypothyroidism   . Pre-diabetes   . Rheumatoid arthritis(714.0)    Past Surgical History: Past Surgical History:  Procedure Laterality Date  . ABDOMINAL HYSTERECTOMY    . ANTERIOR CERVICAL DECOMP/DISCECTOMY FUSION N/A 01/03/2018   Procedure: ANTERIOR CERVICAL DECOMPRESSION/DISCECTOMY FUSION, ALLOGRAFT, PLATE;  Surgeon: Eldred Manges, MD;  Location: [facility] OR;  Service: Orthopedics;  Laterality: N/A;  . APPENDECTOMY    . CHOLECYSTECTOMY    . REPLACEMENT TOTAL KNEE BILATERAL    . TONSILLECTOMY     as  a child  . TOTAL HIP ARTHROPLASTY     right and left   Social History: Social History   Socioeconomic History  . Marital status: Married    Spouse name: Dorene Sorrow  . Number of children: 2  . Years of education: Not on file  . Highest education level: Not on file  Occupational History  .  Occupation: Retired Runner, broadcasting/film/video    Comment: Corporate treasurer  Social Needs  . Financial resource strain: Not on file  . Food insecurity:    Worry: Not on file    Inability: Not on file  . Transportation needs:    Medical: Not on file    Non-medical: Not on file  Tobacco Use  . Smoking status: Never Smoker  . Smokeless tobacco: Never Used  Substance and Sexual Activity  . Alcohol use: No  . Drug use: Not on file  . Sexual activity: Not on file  Lifestyle  . Physical activity:    Days per week: Not on file    Minutes per session: Not on file  . Stress: Not on file  Relationships  . Social connections:    Talks on phone: Not on file    Gets together: Not on file    Attends religious service: Not on file    Active member of club or organization: Not on file    Attends meetings of clubs or organizations: Not on file    Relationship status: Not on file  Other Topics Concern  . Not on file  Social History Narrative   No regular exercise. 2 caffeine drinks per day.    Family History: Family History  Problem Relation Age of Onset  . Heart disease Father 30  . Diabetes Other  aunt   . Stroke Mother 13   Allergies: Allergies  Allergen Reactions  . Iodine Other (See Comments)    Shook violently and passed out.  . Codeine Other (See Comments)    Hallucinations  . Lipitor [Atorvastatin] Other (See Comments)    Myalgias   Medications: See med rec.  Review of Systems: No fevers, chills, night sweats, weight loss, chest pain, or shortness of breath.   Objective:    General: Well Developed, well nourished, and in no acute distress.  Neuro: Alert and oriented x3, extra-ocular muscles intact, sensation grossly intact.  HEENT: Normocephalic, atraumatic, pupils equal round reactive to light, neck supple, no masses, no lymphadenopathy, thyroid nonpalpable.  Skin: Warm and dry, no rashes. Cardiac: Regular rate and rhythm, no murmurs rubs or gallops, no lower extremity edema.    Respiratory: Clear to auscultation bilaterally. Not using accessory muscles, speaking in full sentences. Right leg: Bruised, swollen from the buttock down to the knee.  She has tenderness medially along the entirety of the thigh.  Neurovascularly intact distally.  CT reviewed, there is enlargement and lucency of the abductor magnus muscle belly.  Impression and Recommendations:    Mass of thigh, right Question large hematoma versus intramuscular tumor. She has dropped her hemoglobin over 3 points. 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 Discussed case with radiology. Checking her CBC, CMP, anemia panels today. If it does look like iron deficiency anemia I will probably go ahead and get upper and lower endoscopy as well. She does endorse significant constipation but no melena or hematochezia. Adding some hydrocodone for pain, Colace to soften her stools.  ___________________________________________ Ihor Austin. Benjamin Stain, M.D., ABFM., CAQSM. Primary Care and Sports Medicine [facility]  Adjunct Professor of Family Medicine  [employer]

## 2018-05-19 LAB — COMPREHENSIVE METABOLIC PANEL
AG Ratio: 1.7 (calc) (ref 1.0–2.5)
ALT: 15 U/L (ref 6–29)
AST: 24 U/L (ref 10–35)
Albumin: 3.8 g/dL (ref 3.6–5.1)
Alkaline phosphatase (APISO): 88 U/L (ref 33–130)
BUN: 19 mg/dL (ref 7–25)
CO2: 23 mmol/L (ref 20–32)
Calcium: 9.4 mg/dL (ref 8.6–10.4)
Chloride: 103 mmol/L (ref 98–110)
Creat: 0.67 mg/dL (ref 0.60–0.93)
Globulin: 2.3 g/dL (calc) (ref 1.9–3.7)
Glucose, Bld: 79 mg/dL (ref 65–99)
Potassium: 4.6 mmol/L (ref 3.5–5.3)
Sodium: 139 mmol/L (ref 135–146)
Total Bilirubin: 2.1 mg/dL — ABNORMAL HIGH (ref 0.2–1.2)
Total Protein: 6.1 g/dL (ref 6.1–8.1)

## 2018-05-19 LAB — EXTRA LAV TOP TUBE

## 2018-05-19 LAB — CK: Total CK: 146 U/L — ABNORMAL HIGH (ref 29–143)

## 2018-05-19 LAB — B12 AND FOLATE PANEL
Folate: 21 ng/mL
Vitamin B-12: 437 pg/mL (ref 200–1100)

## 2018-05-20 ENCOUNTER — Ambulatory Visit: Admission: RE | Admit: 2018-05-20 | Payer: Medicare Other | Source: Ambulatory Visit

## 2018-05-20 ENCOUNTER — Other Ambulatory Visit: Payer: Self-pay

## 2018-05-20 DIAGNOSIS — R238 Other skin changes: Secondary | ICD-10-CM

## 2018-05-20 DIAGNOSIS — R82998 Other abnormal findings in urine: Secondary | ICD-10-CM

## 2018-05-20 DIAGNOSIS — R233 Spontaneous ecchymoses: Secondary | ICD-10-CM

## 2018-05-20 DIAGNOSIS — D649 Anemia, unspecified: Secondary | ICD-10-CM

## 2018-05-20 NOTE — Telephone Encounter (Signed)
Pt called around [phone] with some questions rin regards to her MRI tomorrow. She is hoping that you could call her back. [phone]

## 2018-05-21 ENCOUNTER — Ambulatory Visit (HOSPITAL_BASED_OUTPATIENT_CLINIC_OR_DEPARTMENT_OTHER): Admission: RE | Admit: 2018-05-21 | Payer: Medicare Other | Source: Ambulatory Visit

## 2018-05-22 ENCOUNTER — Encounter: Payer: Self-pay | Admitting: Family Medicine

## 2018-05-22 DIAGNOSIS — R220 Localized swelling, mass and lump, head: Secondary | ICD-10-CM | POA: Insufficient documentation

## 2018-05-23 ENCOUNTER — Ambulatory Visit (INDEPENDENT_AMBULATORY_CARE_PROVIDER_SITE_OTHER): Payer: Medicare Other | Admitting: Family Medicine

## 2018-05-23 ENCOUNTER — Encounter: Payer: Self-pay | Admitting: Family Medicine

## 2018-05-23 VITALS — BP 137/66 | HR 79 | Wt 209.0 lb

## 2018-05-23 DIAGNOSIS — M79604 Pain in right leg: Secondary | ICD-10-CM | POA: Diagnosis not present

## 2018-05-23 DIAGNOSIS — D649 Anemia, unspecified: Secondary | ICD-10-CM

## 2018-05-23 DIAGNOSIS — R2241 Localized swelling, mass and lump, right lower limb: Secondary | ICD-10-CM | POA: Diagnosis not present

## 2018-05-23 LAB — POCT HEMOGLOBIN: Hemoglobin: 9.2 g/dL — AB (ref 11–14.6)

## 2018-05-23 MED ORDER — LOSARTAN POTASSIUM 50 MG PO TABS: 50.0000 mg | ORAL_TABLET | Freq: Every day | ORAL | 1 refills | Status: DC

## 2018-05-23 NOTE — Progress Notes (Signed)
poct

## 2018-05-23 NOTE — Progress Notes (Signed)
Joann Maddox is a 78 y.o. female who presents to [name]: Primary Care Sports Medicine today for follow-up thigh pain.  Joann Maddox has been seen several times for right leg pain.  She was seen over the last week or so with ecchymosis and swelling of her leg identified as significant hematoma when seen on CT scan of hip and pelvis.  Hematoma appeared to originate at the hip adductor's.  She does not was found to have significant anemia down to about 8.5 hemoglobin from her baseline of about 11.5 last week.  She is feeling fatigued but overall slightly improved.  She notes he continues to experience pain but is slowly improving.  Angioedema: Joann Maddox developed tongue swelling yesterday. It spontaneously resolved with no treatment after a few hours. She notes that she never had any trouble breathing or swallowing. She has never had anything like this before. She had been taking lisinopril for years with no problems. She did start taking colace recently. She feels better now but is worried that the swelling may return.    ROS as above:  Exam:  BP 137/66   Pulse 79   Wt 209 lb (94.8 kg)   BMI 40.82 kg/m  Wt Readings from Last 5 Encounters:  05/23/18 209 lb (94.8 kg)  05/16/18 205 lb (93 kg)  05/09/18 209 lb (94.8 kg)  05/03/18 205 lb (93 kg)  04/01/18 205 lb (93 kg)    Gen: Well NAD HEENT: EOMI,  MMM no tongue or lip swelling.  Lungs: Normal work of breathing. CTABL Heart: RRR no MRG Abd: NABS, Soft. Nondistended, Nontender Exts: Brisk capillary refill, warm and well perfused.  Left leg with swelling and ecchymosis along medial thigh and into lower leg and foot. Medial thigh is TTP. Intact pulses and sensation into the foot.  Hip and thigh motion painful.  Antalgic gait is painful.   Lab and Radiology Results EXAM: CT OF THE RIGHT HIP WITHOUT CONTRAST  TECHNIQUE: Multidetector CT imaging of  the right hip was performed according to the standard protocol. Multiplanar CT image reconstructions were also generated.  COMPARISON:  Radiographs 05/09/2018  FINDINGS: Bones/Joint/Cartilage  Degenerative right sacroiliitis with subcortical sclerosis, spurring, and nitrogen gas phenomenon.  Right total hip prosthesis in place no appreciable periprosthetic fracture or hardware failure. There is spurring of the acetabular margin not covered by the acetabular shell. Subcortical lucencies along the superior acetabulum as on images 32 through 40 of series 3, favoring degenerative subcortical cysts, less likely to be due to particulate disease. No pubic ramus fracture observed.  Ligaments  Suboptimally assessed by CT.  Muscles and Tendons  Abnormal prominence of the abductor magnus with paucity of fatty stapling in the muscle suspicious for intramuscular hematoma or mass.  Atrophy of the gluteus musculature, obturator internus, and piriformis. Atrophic operator externus and quadratus femoris muscles.  Soft tissues  Mild subcutaneous edema lateral to the hip.  IMPRESSION: 1. Abnormally dense and expanded adductor magnus muscle suspicious for muscle hematoma or less likely, muscle mass. 2. There is some atrophy of other regional musculature including the gluteus musculature, obturator internus, piriformis, obturator externus, and quadratus femoris. 3. Degenerative sacroiliitis. 4. Subcortical cysts along the bony upper acetabulum, likely degenerative.   Electronically Signed   By: Gaylyn Rong M.D.   On: 05/16/2018 12:54 I personally (independently) visualized and performed the interpretation of the images attached in this note.   Lab Results  Component Value Date   WBC  4.7 03/31/2018   HGB 9.2 (A) 05/23/2018   HCT 35.0 03/31/2018   MCV 91.9 03/31/2018   PLT 264 03/31/2018   Lab Results  Component Value Date   FERRITIN 54 12/03/2017      Chemistry      Component Value Date/Time   NA 139 05/18/2018 1514   NA 143 05/01/2015   K 4.6 05/18/2018 1514   CL 103 05/18/2018 1514   CO2 23 05/18/2018 1514   BUN 19 05/18/2018 1514   BUN 20 05/01/2015   CREATININE 0.67 05/18/2018 1514   GLU 78 05/01/2015      Component Value Date/Time   CALCIUM 9.4 05/18/2018 1514   ALKPHOS 66 12/23/2017 1402   AST 24 05/18/2018 1514   ALT 15 05/18/2018 1514   BILITOT 2.1 (H) 05/18/2018 1514         Assessment and Plan: 78 y.o. female with right thigh hematoma.  Unclear causal agent.  Fortunately symptoms are improving.  Agree with previous medical decision making to proceed with MRI femur to further characterize this hematoma/evaluate for mass.  We will get with and without contrast.  Will reschedule MRI and follow-up in a week or 2.  Additionally we will proceed with physical therapy which should be very helpful.  Anemia: Improving.  Hemoglobin now improving.  Plan to continue to follow along.   Angioedema: Likely due to ACE inhibitor.  Plan to discontinue ACE inhibitor and switch to losartan.    Recheck 1 week.  Orders Placed This Encounter  Procedures  . Ambulatory referral to Physical Therapy    Referral Priority:   Routine    Referral Type:   Physical Medicine    Referral Reason:   Specialty Services Required    Requested Specialty:   Physical Therapy  . POCT hemoglobin   Meds ordered this encounter  Medications  . losartan (COZAAR) 50 MG tablet    Sig: Take 1 tablet (50 mg total) by mouth daily.    Dispense:  90 tablet    Refill:  1     Historical information moved to improve visibility of documentation.  Past Medical History:  Diagnosis Date  . Anxiety   . Arthritis   . Depression   . Headache(784.0)    migrains  . Heart murmur   . Hypothyroidism   . Pre-diabetes   . Rheumatoid arthritis(714.0)    Past Surgical History:  Procedure Laterality Date  . ABDOMINAL HYSTERECTOMY    . ANTERIOR CERVICAL  DECOMP/DISCECTOMY FUSION N/A 01/03/2018   Procedure: ANTERIOR CERVICAL DECOMPRESSION/DISCECTOMY FUSION, ALLOGRAFT, PLATE;  Surgeon: Eldred Manges, MD;  Location: [facility] OR;  Service: Orthopedics;  Laterality: N/A;  . APPENDECTOMY    . CHOLECYSTECTOMY    . REPLACEMENT TOTAL KNEE BILATERAL    . TONSILLECTOMY     as  a child  . TOTAL HIP ARTHROPLASTY     right and left   Social History   Tobacco Use  . Smoking status: Never Smoker  . Smokeless tobacco: Never Used  Substance Use Topics  . Alcohol use: No   family history includes Diabetes in an other family member; Heart disease (age of onset: 28) in her father; Stroke (age of onset: 30) in her mother.  Medications: Current Outpatient Medications  Medication Sig Dispense Refill  . AMBULATORY NON FORMULARY MEDICATION Walker. Use a needed for leg weakness.  Disp 1 R29.898 1 each 0  . azelaic acid (AZELEX) 20 % cream APPLY TOPICALL 2 TIMES A DAY (MORNING AND  EVENING). APPLY AFTER SKIN IS WASHED AND PATTED DRY. 30 g 1  . butalbital-acetaminophen-caffeine (FIORICET, ESGIC) 50-325-40 MG tablet Take 1 tablet by mouth 2 (two) times daily as needed. (Patient taking differently: Take 1 tablet by mouth 2 (two) times daily as needed for migraine. ) 14 tablet 5  . docusate sodium (COLACE) 100 MG capsule Take 1 capsule (100 mg total) by mouth 3 (three) times daily as needed. 90 capsule 3  . DULoxetine (CYMBALTA) 60 MG capsule Take 1 capsule (60 mg total) by mouth daily. 90 capsule 3  . folic acid (FOLVITE) 1 MG tablet TAKE 1 TABLET BY MOUTH EVERY DAY (Patient taking differently: Take 1 mg by mouth daily. ) 90 tablet 2  . furosemide (LASIX) 20 MG tablet TAKE 1 TABLET BY MOUTH EVERY DAY AS NEEDED (Patient taking differently: Take 20 mg by mouth daily as needed for fluid. ) 90 tablet 1  . gabapentin (NEURONTIN) 300 MG capsule One tab PO qHS for a week, then BID for a week, then TID. May double weekly to a max of 3,600mg /day 180 capsule 3  .  HYDROcodone-acetaminophen (NORCO/VICODIN) 5-325 MG tablet Take 1 tablet by mouth every 8 (eight) hours as needed for moderate pain. 15 tablet 0  . hydroxychloroquine (PLAQUENIL) 200 MG tablet 2 TABLET WITH FOOD OR MILK ONCE A DAY ORALLY 60 tablet 1  . levothyroxine (SYNTHROID, LEVOTHROID) 25 MCG tablet Take 1 tablet (25 mcg total) by mouth daily. 90 tablet 3  . meloxicam (MOBIC) 15 MG tablet TAKE ONE TABLET BY MOUTH EACH AM WITH BREAKFAST AS NEEDED FOR PAIN 30 tablet 2  . metFORMIN (GLUCOPHAGE) 500 MG tablet TAKE 1 TABLET BY MOUTH 2 TIMES DAILY WITH A MEAL. (Patient taking differently: Take 500 mg by mouth daily. ) 180 tablet 1  . mirabegron ER (MYRBETRIQ) 25 MG TB24 tablet Take 1 tablet (25 mg total) by mouth at bedtime. 90 tablet 1  . Multiple Vitamin (MULTIVITAMIN) tablet Take 1 tablet by mouth daily.    Marland Kitchen. nystatin (MYCOSTATIN/NYSTOP) powder APPLY 1 GRAM TOPICALLY 2 (TWO) TIMES DAILY. X 3 WEEKS. 60 g 1  . Omega-3 Fatty Acids (FISH OIL) 1200 MG CAPS Take 1,200 mg by mouth 2 (two) times a week. WITH OMEGA-3 360 MG    . potassium chloride (K-DUR) 10 MEQ tablet TAKE 1 TABLET (10 MEQ TOTAL) BY MOUTH 2 (TWO) TIMES DAILY. 60 tablet 11  . rosuvastatin (CRESTOR) 10 MG tablet Take 1 tablet (10 mg total) by mouth at bedtime. 90 tablet 3  . senna (SENOKOT) 8.6 MG tablet Take 1 tablet by mouth at bedtime.     . traMADol (ULTRAM) 50 MG tablet TAKE 1-2 TABS BY MOUTH EVERY 12 HOURS AS NEEDED FOR PAIN 120 tablet 1  . TURMERIC PO Take 1,000 mg by mouth daily.    . vitamin B-12 (CYANOCOBALAMIN) 100 MCG tablet Take 100 mcg by mouth at bedtime.     . Vitamin D, Ergocalciferol, (DRISDOL) 50000 units CAPS capsule TAKE 1 CAPSULE (50,000 UNITS TOTAL) BY MOUTH ONCE A WEEK. (Patient taking differently: Take 50,000 Units by mouth every Sunday. ) 12 capsule 2  . losartan (COZAAR) 50 MG tablet Take 1 tablet (50 mg total) by mouth daily. 90 tablet 1   No current facility-administered medications for this visit.    Allergies   Allergen Reactions  . Iodine Other (See Comments)    Shook violently and passed out.  . Codeine Other (See Comments)    Hallucinations  . Lipitor [Atorvastatin] Other (  See Comments)    Myalgias     Discussed warning signs or symptoms. Please see discharge instructions. Patient expresses understanding.

## 2018-05-23 NOTE — Patient Instructions (Addendum)
Thank you for coming in today.  Attend PT.  Reschedule thigh/femur MRI.   Recheck with me in 1 week.  Return sooner if needed.   Work on compression stockings  Continue leg motion.    STOP lisinopril and Start Losartan   Hematoma A hematoma is a collection of blood. A hematoma can happen:  Under the skin.  In an organ.  In a body space.  In a joint space.  In other tissues. The blood can thicken (clot) to form a lump that you can see and feel. The lump is often hard and may become sore and tender. The lump can be very small or very big. Most hematomas get better in a few days to weeks. However, some hematomas may be serious and need medical care. What are the causes? This condition is caused by:  An injury.  Blood that leaks under the skin.  Problems from surgeries.  Medical conditions that cause bleeding or bruising. What increases the risk? You are more likely to develop this condition if:  You are an older adult.  You use medicines that thin your blood. What are the signs or symptoms? Symptoms depend on where the hematoma is in your body.  If the hematoma is under the skin, there is: ? A firm lump on the body. ? Pain and tenderness in the area. ? Bruising. The skin above the lump may be blue, dark blue, purple-red, or yellowish.  If the hematoma is deep in the tissues or body spaces, there may be: ? Blood in the stomach. This may cause pain in the belly (abdomen), weakness, passing out (fainting), and shortness of breath. ? Blood in the head. This may cause a headache, weakness, trouble speaking or understanding speech, or passing out. How is this diagnosed? This condition is diagnosed based on:  Your medical history.  A physical exam.  Imaging tests, such as ultrasound or CT scan.  Blood tests. How is this treated? Treatment depends on the cause, size, and location of the hematoma. Treatment may include:  Doing nothing. Many hematomas go away on  their own without treatment.  Surgery or close monitoring. This may be needed for large hematomas or hematomas that affect the body's organs.  Medicines. These may be given if a medical condition caused the hematoma. Follow these instructions at home: Managing pain, stiffness, and swelling   If told, put ice on the area. ? Put ice in a plastic bag. ? Place a towel between your skin and the bag. ? Leave the ice on for 20 minutes, 2-3 times a day for the first two days.  If told, put heat on the affected area after putting ice on the area for two days. Use the heat source that your doctor tells you to use. This could be a moist heat pack or a heating pad. To do this: ? Place a towel between your skin and the heat source. ? Leave the heat on for 20-30 minutes. ? Remove the heat if your skin turns bright red. This is very important if you are unable to feel pain, heat, or cold. You may have a greater risk of getting burned.  Raise (elevate) the affected area above the level of your heart while you are sitting or lying down.  Wrap the affected area with an elastic bandage, if told by your doctor. Do not wrap the bandage too tightly.  If your hematoma is on a leg or foot and is painful, your doctor may  give you crutches. Use them as told by your doctor. General instructions  Take over-the-counter and prescription medicines only as told by your doctor.  Keep all follow-up visits as told by your doctor. This is important. Contact a doctor if:  You have a fever.  The swelling or bruising gets worse.  You start to get more hematomas. Get help right away if:  Your pain gets worse.  Your pain is not getting better with medicine.  Your skin over the hematoma breaks or starts to bleed.  Your hematoma is in your chest or belly and you: ? Pass out. ? Feel weak. ? Become short of breath.  You have a hematoma on your scalp that is caused by a fall or injury, and you: ? Have a headache  that gets worse. ? Have trouble speaking or understanding speech. ? Become less alert or you pass out. Summary  A hematoma is a collection of blood in any part of your body.  Most hematomas get better on their own in a few days to weeks. Some may need medical care.  Follow instructions from your doctor about how to care for your hematoma.  Contact a doctor if the swelling or bruising gets worse, or if you are short of breath. This information is not intended to replace advice given to you by your health care provider. Make sure you discuss any questions you have with your health care provider. Document Released: 05/21/2004 Document Revised: 09/16/2017 Document Reviewed: 09/16/2017 Elsevier Interactive Patient Education  2019 [employer] Inc[employer]    Angioedema  Angioedema is sudden swelling in the body. The swelling can happen in any part of the body. It often happens on the skin and causes itchy, bumpy patches (hives) to form. This condition may:  Happen only one time.  Happen more than one time. It may come back at random times.  Keep coming back for a number of years. Someday it may stop coming back. Follow these instructions at home:  Take over-the-counter and prescription medicines only as told by your doctor.  If you were given medicines for emergency allergy treatment, always carry them with you.  Wear a medical bracelet as told by your doctor.  Avoid the things that cause your attacks (triggers).  If this condition was passed to you from your parents and you want to have kids, talk to your doctor. Your kids may also have this condition. Contact a doctor if:  You have another attack.  Your attacks happen more often, even after you take steps to prevent them.  This condition was passed to you by your parents and you want to have kids. Get help right away if:  Your mouth, tongue, or lips get very swollen.  You have trouble breathing.  You have trouble  swallowing.  You pass out (faint). This information is not intended to replace advice given to you by your health care provider. Make sure you discuss any questions you have with your health care provider. Document Released: 04/01/2009 Document Revised: 11/13/2015 Document Reviewed: 10/22/2015 Elsevier Interactive Patient Education  2019 [employer]

## 2018-05-25 NOTE — Telephone Encounter (Signed)
Called pt and apologized on behalf of Dr. Linford Arnold not being able to call her directly. I asked her about what questions she has. She stated that she believed that when she had the 1st MRI of her whole leg that this looked at her femur and thought that whatever they were looking for would have shown up then. Now she is being told that she has to have an MRI for her femur and she thought that he possibly mentioned something else about having "another" CT scan done she is not very sure about that. She said that she is supposed to be starting PT on tomorrow however,she got this pushed out until Tuesday due to having her MRI scheduled on Saturday.She informed me that her husband is in the hospital (due to an obstruction in his intestines) she's not sure when he will be home. I told her that if there was anything that we could do for her to please let us know.  She said that she trusts Dr. Linford Arnold. She did tell me that she has been taking the medications that she was given as prescribed which helps some but she is still in pain.   Will fwd to pcp for advice.Marland KitchenMarland KitchenHeath Gold, CMA

## 2018-05-25 NOTE — Telephone Encounter (Signed)
Archie Patten, would you be willing to call her and see if she has any specific questions as she is she scheduled for her MRI i this weekend.

## 2018-05-26 ENCOUNTER — Ambulatory Visit: Payer: Medicare Other | Admitting: Rehabilitative and Restorative Service Providers"

## 2018-05-26 NOTE — Telephone Encounter (Signed)
Please let her know that the CT of the hip just shows the very end of the femur instead of the entire long bone and they saw a questionable area that they felt could be blood or a mass so the MRI is to look better at the soft tissue and to help rule out a growth or mass.  We can try hydrocone for a couple of days for her pain if she would like. Just letm know

## 2018-05-28 ENCOUNTER — Ambulatory Visit (HOSPITAL_BASED_OUTPATIENT_CLINIC_OR_DEPARTMENT_OTHER)
Admission: RE | Admit: 2018-05-28 | Discharge: 2018-05-28 | Disposition: A | Discharge status: Home / Self Care | Payer: Medicare Other | Source: Ambulatory Visit | Attending: Sports Medicine | Admitting: Sports Medicine

## 2018-05-28 DIAGNOSIS — R2241 Localized swelling, mass and lump, right lower limb: Secondary | ICD-10-CM | POA: Diagnosis not present

## 2018-05-28 MED ORDER — GADOBUTROL 1 MMOL/ML IV SOLN
10.0000 mL | Freq: Once | INTRAVENOUS | Status: AC | PRN
  Administered 2018-05-28: 10 mL via INTRAVENOUS

## 2018-05-30 ENCOUNTER — Ambulatory Visit (INDEPENDENT_AMBULATORY_CARE_PROVIDER_SITE_OTHER): Payer: Medicare Other | Admitting: Family Medicine

## 2018-05-30 ENCOUNTER — Encounter: Payer: Self-pay | Admitting: Family Medicine

## 2018-05-30 VITALS — BP 122/61 | HR 81 | Wt 208.0 lb

## 2018-05-30 DIAGNOSIS — M25521 Pain in right elbow: Secondary | ICD-10-CM

## 2018-05-30 DIAGNOSIS — S7011XD Contusion of right thigh, subsequent encounter: Secondary | ICD-10-CM | POA: Diagnosis not present

## 2018-05-30 DIAGNOSIS — D649 Anemia, unspecified: Secondary | ICD-10-CM

## 2018-05-30 DIAGNOSIS — R82998 Other abnormal findings in urine: Secondary | ICD-10-CM | POA: Diagnosis not present

## 2018-05-30 NOTE — Patient Instructions (Signed)
Thank you for coming in today. Try stopping gabapentin at bedtime.  Attend PT for the thigh/leg and well as the arm pain.   Recheck in 3 weeks.   Return sooner if needed.   Use compression stockings.    Seroma A seroma is a collection of fluid on the body that looks like swelling or a mass. Seromas form where tissue has been injured or cut. Seromas vary in size. Some are small and painless. Others may become large and cause pain or discomfort. Many seromas go away on their own as the fluid is naturally absorbed by the body, and some seromas need to be drained. What are the causes? Seromas form as the result of damage to tissue or the removal of tissue. This tissue damage may occur during surgery or because of an injury or trauma. When tissue is disrupted or removed, empty space is created. The body's natural defense system (immune system) causes fluid to enter the empty space and form a seroma. What are the signs or symptoms? Symptoms of this condition include:  Swelling at the site of a surgical cut (incision) or an injury.  Drainage of clear fluid at the surgery or injury site.  Discomfort or pain. How is this diagnosed? This condition is diagnosed based on your symptoms, your medical history, and a physical exam. During the exam, your health care provider will press on the seroma. You may also have tests, including:  Blood tests.  Imaging tests, such as an ultrasound or CT scan. How is this treated? Some seromas go away (resolve) on their own. Your health care provider may monitor you to make sure the seroma does not cause any complications. If your seroma does not resolve on its own, treatment may include:  Using a needle to drain the fluid from the seroma (needle aspiration).  Inserting a flexible tube (catheter) to drain the fluid.  Applying a bandage (dressing), such as an elastic bandage or binder.  Antibiotic medicines, if the seroma becomes infected. In rare cases,  surgery may be done to remove the seroma and repair the area. Follow these instructions at home:   If you were prescribed an antibiotic medicine, take it as told by your health care provider. Do not stop taking the antibiotic even if you start to feel better.  Return to your normal activities as told by your health care provider. Ask your health care provider what activities are safe for you.  Take over-the-counter and prescription medicines only as told by your health care provider.  Check your seroma every day for signs of infection. Check for: ? Redness or pain. ? Fluid or pus. ? More swelling. ? Warmth.  Keep all follow-up visits as told by your health care provider. This is important. Contact a health care provider if:  You have a fever.  You have redness or pain at the site of the seroma.  You have fluid or pus coming from the seroma.  Your seroma is more swollen or is getting bigger.  Your seroma is warm to the touch. This information is not intended to replace advice given to you by your health care provider. Make sure you discuss any questions you have with your health care provider. Document Released: 08/08/2012 Document Revised: 01/24/2016 Document Reviewed: 01/24/2016 Elsevier Interactive Patient Education  2019 [employer].

## 2018-05-30 NOTE — Progress Notes (Signed)
Joann Maddox is a 78 y.o. female who presents to [facility] today for follow-up on right leg hematoma. Joann Maddox' MRI on 02/01 revealed a hematoma of the right adductor muscles and no tumor. The bruising has really gone down on the thigh and anterior calf. She does still note some bruising on the posterior calf, ankles, and toes. Her calf and foot are still very swollen. She has a lot of pain all around her thigh and on the posterior aspect of her calf. She has been taking tramadol and gabapentin at bedtime for pain, but notes that she has had difficulty waking up and being alert in the mornings.  She notes dark-colored urine as well in the morning.  This is improving but still present.  No urinary frequency urgency or dysuria.  Joann Maddox also complains of right elbow and upper arm pain for the past 2-3 weeks. She feels a lot weaker and has not been able to close the car door or lift drinks to/from the fridge. It hurts worse when she picks up things with her forearm pronated and feels better if her forearm is supinated when picking something up. She was previously able to do all these things before 3 weeks ago. No known injuries. No numbness or tingling.     ROS:  As above  Exam:  BP 122/61   Pulse 81   Wt 208 lb (94.3 kg)   BMI 40.62 kg/m  Wt Readings from Last 5 Encounters:  05/30/18 208 lb (94.3 kg)  05/23/18 209 lb (94.8 kg)  05/16/18 205 lb (93 kg)  05/09/18 209 lb (94.8 kg)  05/03/18 205 lb (93 kg)   General: Well Developed, well nourished, and in no acute distress.  Neuro/Psych: Alert and oriented x3, extra-ocular muscles intact, able to move all 4 extremities, sensation grossly intact. Skin: Warm and dry, no rashes noted. Bruising diffusely present over right ankle, foot, and toes.  Respiratory: Not using accessory muscles, speaking in full sentences, trachea midline.  Cardiovascular: Pulses palpable. 1+ pitting edema of the  right calf.  Abdomen: Does not appear distended. MSK:  Right foot:  Appears edematous and bruised.  Tender to palpation over the dorsum of the foot.  Limited ankle and toe ROM due to swelling.  Strength not tested due to pain.  Pulses and capillary refill.   Right elbow:  Normal appearance. No bruising, erythema, or edema. Mildly tender to palpation over lateral epicondyle.  Limited ROM due to pain.  Strength 5/5 with resisted elbow extension. Pulses and capillary refill normal.  Pain reproduced in the lateral elbow with resisted extension of wrist and fingers.  Cervical spine:  Normal appearance.  Limited extension and rotation. Normal flexion.  No pain reproduced with neck ROM.    Lab and Radiology Results  Mr Femur Right W Wo Contrast  Result Date: 05/28/2018 CLINICAL DATA:  Masslike abnormality of the right thigh. Right leg weakness and soreness to touch x3 weeks. History of right hip replacement. EXAM: MRI OF THE RIGHT FEMUR WITHOUT AND WITH CONTRAST TECHNIQUE: Multiplanar, multisequence MR imaging of the right thigh was performed both before and after administration of intravenous contrast. CONTRAST:  10 cc Gadavist COMPARISON:  None. FINDINGS: Bones/Joint/Cartilage Susceptibility artifact emanating from the patient's known bilateral hip arthroplasties are noted without complicating features. No suspicious osseous lesion of the included right femur. No acute fracture or marrow edema is seen. Ligaments Noncontributory Muscles and Tendons Intramuscular hematoma of the right abductor  magnus muscle is identified with blood products/clots noted within measuring up to 12.2 cm craniocaudad by 7.8 cm transverse by 5.8 cm AP. Smaller foci of intramuscular hemorrhage/hematoma is noted further caudad within the muscle as well. No solid enhancing mass. No significant muscle atrophy. Soft tissues Trace soft tissue edema along the lateral aspect of the right thigh and along the obturator internus.  IMPRESSION: Multiloculated intramuscular hematomas of the right adductor magnus, the largest component measuring 12.2 x 7.8 x 5.8 cm. This can be followed up with ultrasound to assure improvement. No solid enhancing masses are identified of the right thigh. 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 No acute osseous appearing abnormality is seen. Electronically Signed   By: Tollie Ethavid  Kwon M.D.   On: 05/28/2018 20:24  I personally (independently) visualized and performed the interpretation of the images attached in this note.   Lab Results  Component Value Date   WBC 4.7 03/31/2018   HGB 9.2 (A) 05/23/2018   HCT 35.0 03/31/2018   MCV 91.9 03/31/2018   PLT 264 03/31/2018     Assessment and Plan: 78 y.o. female with  Right thigh adductor hematoma: Joann Maddox was found to have a right adductor hematoma on MRI. Reviewed the MRI results with patient. The bruising is much improved, but she is still having significant pain. Continue Tramadol but stop gabapentin due to difficulty waking up in the mornings. Advised her to use compression socks to help with the swelling. She has her first PT appointment tomorrow. Discussed with pt that if this turns into a seroma will need to consider draining it. Follow-up in 3 weeks.   Right elbow and upper arm pain: Likely tennis elbow due to the positioning of her arm when she has pain. Will address this in PT. Follow-up in 3 weeks.   Anemia and dark-colored urine: Likely resolving hematoma.  Likely will be improving but will check labs listed below including CBC metabolic panel urinalysis and fractionated bilirubin.  Anticipate dark urine due to urobilinogen.  PDMP not reviewed this encounter. Orders Placed This Encounter  Procedures  . CBC  . COMPLETE METABOLIC PANEL WITH GFR  . Bilirubin, fractionated (tot/dir/indir)  . Urinalysis, Routine w reflex microscopic  . Ambulatory referral to Physical Therapy    Referral Priority:   Routine     Referral Type:   Physical Medicine    Referral Reason:   Specialty Services Required    Requested Specialty:   Physical Therapy   No orders of the defined types were placed in this encounter.   Historical information moved to improve visibility of documentation.  Past Medical History:  Diagnosis Date  . Anxiety   . Arthritis   . Depression   . Headache(784.0)    migrains  . Heart murmur   . Hypothyroidism   . Pre-diabetes   . Rheumatoid arthritis(714.0)    Past Surgical History:  Procedure Laterality Date  . ABDOMINAL HYSTERECTOMY    . ANTERIOR CERVICAL DECOMP/DISCECTOMY FUSION N/A 01/03/2018   Procedure: ANTERIOR CERVICAL DECOMPRESSION/DISCECTOMY FUSION, ALLOGRAFT, PLATE;  Surgeon: Eldred MangesYates, Mark C, MD;  Location: [facility] OR;  Service: Orthopedics;  Laterality: N/A;  . APPENDECTOMY    . CHOLECYSTECTOMY    . REPLACEMENT TOTAL KNEE BILATERAL    . TONSILLECTOMY     as  a child  . TOTAL HIP ARTHROPLASTY     right and left   Social History   Tobacco Use  . Smoking status: Never Smoker  . Smokeless tobacco: Never Used  Substance  Use Topics  . Alcohol use: No   family history includes Diabetes in an other family member; Heart disease (age of onset: 94) in her father; Stroke (age of onset: 19) in her mother.  Medications: Current Outpatient Medications  Medication Sig Dispense Refill  . AMBULATORY NON FORMULARY MEDICATION Walker. Use a needed for leg weakness.  Disp 1 R29.898 1 each 0  . azelaic acid (AZELEX) 20 % cream APPLY TOPICALL 2 TIMES A DAY (MORNING AND EVENING). APPLY AFTER SKIN IS WASHED AND PATTED DRY. 30 g 1  . butalbital-acetaminophen-caffeine (FIORICET, ESGIC) 50-325-40 MG tablet Take 1 tablet by mouth 2 (two) times daily as needed. (Patient taking differently: Take 1 tablet by mouth 2 (two) times daily as needed for migraine. ) 14 tablet 5  . docusate sodium (COLACE) 100 MG capsule Take 1 capsule (100 mg total) by mouth 3 (three) times daily as needed. 90 capsule 3    . DULoxetine (CYMBALTA) 60 MG capsule Take 1 capsule (60 mg total) by mouth daily. 90 capsule 3  . folic acid (FOLVITE) 1 MG tablet TAKE 1 TABLET BY MOUTH EVERY DAY (Patient taking differently: Take 1 mg by mouth daily. ) 90 tablet 2  . furosemide (LASIX) 20 MG tablet TAKE 1 TABLET BY MOUTH EVERY DAY AS NEEDED (Patient taking differently: Take 20 mg by mouth daily as needed for fluid. ) 90 tablet 1  . gabapentin (NEURONTIN) 300 MG capsule One tab PO qHS for a week, then BID for a week, then TID. May double weekly to a max of 3,600mg /day 180 capsule 3  . hydroxychloroquine (PLAQUENIL) 200 MG tablet 2 TABLET WITH FOOD OR MILK ONCE A DAY ORALLY 60 tablet 1  . levothyroxine (SYNTHROID, LEVOTHROID) 25 MCG tablet Take 1 tablet (25 mcg total) by mouth daily. 90 tablet 3  . losartan (COZAAR) 50 MG tablet Take 1 tablet (50 mg total) by mouth daily. 90 tablet 1  . meloxicam (MOBIC) 15 MG tablet TAKE ONE TABLET BY MOUTH EACH AM WITH BREAKFAST AS NEEDED FOR PAIN 30 tablet 2  . metFORMIN (GLUCOPHAGE) 500 MG tablet TAKE 1 TABLET BY MOUTH 2 TIMES DAILY WITH A MEAL. (Patient taking differently: Take 500 mg by mouth daily. ) 180 tablet 1  . mirabegron ER (MYRBETRIQ) 25 MG TB24 tablet Take 1 tablet (25 mg total) by mouth at bedtime. 90 tablet 1  . Multiple Vitamin (MULTIVITAMIN) tablet Take 1 tablet by mouth daily.    Marland Kitchen nystatin (MYCOSTATIN/NYSTOP) powder APPLY 1 GRAM TOPICALLY 2 (TWO) TIMES DAILY. X 3 WEEKS. 60 g 1  . Omega-3 Fatty Acids (FISH OIL) 1200 MG CAPS Take 1,200 mg by mouth 2 (two) times a week. WITH OMEGA-3 360 MG    . potassium chloride (K-DUR) 10 MEQ tablet TAKE 1 TABLET (10 MEQ TOTAL) BY MOUTH 2 (TWO) TIMES DAILY. 60 tablet 11  . rosuvastatin (CRESTOR) 10 MG tablet Take 1 tablet (10 mg total) by mouth at bedtime. 90 tablet 3  . senna (SENOKOT) 8.6 MG tablet Take 1 tablet by mouth at bedtime.     . traMADol (ULTRAM) 50 MG tablet TAKE 1-2 TABS BY MOUTH EVERY 12 HOURS AS NEEDED FOR PAIN 120 tablet 1  .  TURMERIC PO Take 1,000 mg by mouth daily.    . vitamin B-12 (CYANOCOBALAMIN) 100 MCG tablet Take 100 mcg by mouth at bedtime.     . Vitamin D, Ergocalciferol, (DRISDOL) 50000 units CAPS capsule TAKE 1 CAPSULE (50,000 UNITS TOTAL) BY MOUTH ONCE A WEEK. (Patient  taking differently: Take 50,000 Units by mouth every Sunday. ) 12 capsule 2   No current facility-administered medications for this visit.    Allergies  Allergen Reactions  . Iodine Other (See Comments)    Shook violently and passed out.  . Codeine Other (See Comments)    Hallucinations  . Lipitor [Atorvastatin] Other (See Comments)    Myalgias      Discussed warning signs or symptoms. Please see discharge instructions. Patient expresses understanding.  I personally was present and performed or re-performed the history, physical exam and medical decision-making activities of this service and have verified that the service and findings are accurately documented in the student's note. ___________________________________________ Clementeen GrahamEvan  M.D., ABFM., CAQSM. Primary Care and Sports Medicine Adjunct Instructor of Family Medicine  [employer]

## 2018-05-31 ENCOUNTER — Ambulatory Visit (INDEPENDENT_AMBULATORY_CARE_PROVIDER_SITE_OTHER): Payer: Medicare Other | Admitting: Rehabilitative and Restorative Service Providers"

## 2018-05-31 ENCOUNTER — Other Ambulatory Visit: Payer: Self-pay | Admitting: Sports Medicine

## 2018-05-31 ENCOUNTER — Encounter: Payer: Self-pay | Admitting: Rehabilitative and Restorative Service Providers"

## 2018-05-31 DIAGNOSIS — M79605 Pain in left leg: Secondary | ICD-10-CM

## 2018-05-31 DIAGNOSIS — R600 Localized edema: Secondary | ICD-10-CM

## 2018-05-31 DIAGNOSIS — R29898 Other symptoms and signs involving the musculoskeletal system: Secondary | ICD-10-CM

## 2018-05-31 DIAGNOSIS — R531 Weakness: Secondary | ICD-10-CM

## 2018-05-31 LAB — URINALYSIS, ROUTINE W REFLEX MICROSCOPIC
Bilirubin Urine: NEGATIVE
Glucose, UA: NEGATIVE
Hgb urine dipstick: NEGATIVE
Ketones, ur: NEGATIVE
Leukocytes, UA: NEGATIVE
Nitrite: NEGATIVE
Protein, ur: NEGATIVE
Specific Gravity, Urine: 1.03 (ref 1.001–1.03)
pH: 5 (ref 5.0–8.0)

## 2018-05-31 LAB — CBC
HCT: 30.9 % — ABNORMAL LOW (ref 35.0–45.0)
Hemoglobin: 10.2 g/dL — ABNORMAL LOW (ref 11.7–15.5)
MCH: 30.4 pg (ref 27.0–33.0)
MCHC: 33 g/dL (ref 32.0–36.0)
MCV: 92 fL (ref 80.0–100.0)
MPV: 9.3 fL (ref 7.5–12.5)
Platelets: 357 10*3/uL (ref 140–400)
RBC: 3.36 10*6/uL — ABNORMAL LOW (ref 3.80–5.10)
RDW: 13.1 % (ref 11.0–15.0)
WBC: 6.1 10*3/uL (ref 3.8–10.8)

## 2018-05-31 LAB — BILIRUBIN, FRACTIONATED(TOT/DIR/INDIR)
Bilirubin, Direct: 0.1 mg/dL (ref 0.0–0.2)
Indirect Bilirubin: 0.5 mg/dL (calc) (ref 0.2–1.2)
Total Bilirubin: 0.6 mg/dL (ref 0.2–1.2)

## 2018-05-31 LAB — COMPLETE METABOLIC PANEL WITH GFR
AG Ratio: 1.5 (calc) (ref 1.0–2.5)
ALT: 16 U/L (ref 6–29)
AST: 19 U/L (ref 10–35)
Albumin: 3.8 g/dL (ref 3.6–5.1)
Alkaline phosphatase (APISO): 94 U/L (ref 37–153)
BUN: 12 mg/dL (ref 7–25)
CO2: 27 mmol/L (ref 20–32)
Calcium: 9.6 mg/dL (ref 8.6–10.4)
Chloride: 107 mmol/L (ref 98–110)
Creat: 0.64 mg/dL (ref 0.60–0.93)
GFR, Est African American: 101 mL/min/{1.73_m2} (ref >=60)
GFR, Est Non African American: 87 mL/min/{1.73_m2} (ref >=60)
Globulin: 2.5 g/dL (calc) (ref 1.9–3.7)
Glucose, Bld: 94 mg/dL (ref 65–99)
Potassium: 4.1 mmol/L (ref 3.5–5.3)
Sodium: 142 mmol/L (ref 135–146)
Total Bilirubin: 0.6 mg/dL (ref 0.2–1.2)
Total Protein: 6.3 g/dL (ref 6.1–8.1)

## 2018-05-31 NOTE — Therapy (Signed)
[facility] [address] [place], [place], [address] Phone: [phone]   Fax:  [phone]  Physical Therapy Evaluation  Patient Details  Name: Joann RamRita P Maddox MRN: [id number] Date of Birth: 07/23/1940 Referring Provider (PT): Dr Clementeen GrahamEvan Corey    Encounter Date: 05/31/2018  PT End of Session - 05/31/18 1309    Visit Number  1    Number of Visits  12    Date for PT Re-Evaluation  07/12/18    PT Start Time  1148    PT Stop Time  1250    PT Time Calculation (min)  62 min    Activity Tolerance  Patient tolerated treatment well       Past Medical History:  Diagnosis Date  . Anxiety   . Arthritis   . Depression   . Headache(784.0)    migrains  . Heart murmur   . Hypothyroidism   . Pre-diabetes   . Rheumatoid arthritis(714.0)     Past Surgical History:  Procedure Laterality Date  . ABDOMINAL HYSTERECTOMY    . ANTERIOR CERVICAL DECOMP/DISCECTOMY FUSION N/A 01/03/2018   Procedure: ANTERIOR CERVICAL DECOMPRESSION/DISCECTOMY FUSION, ALLOGRAFT, PLATE;  Surgeon: Eldred MangesYates, Mark C, MD;  Location: [facility] OR;  Service: Orthopedics;  Laterality: N/A;  . APPENDECTOMY    . CHOLECYSTECTOMY    . REPLACEMENT TOTAL KNEE BILATERAL    . TONSILLECTOMY     as  a child  . TOTAL HIP ARTHROPLASTY     right and left    There were no vitals filed for this visit.   Subjective Assessment - 05/31/18 1157    Subjective  Patient reports that she noticed a large bruise in the posterior Rt thigh with discoloration in the thigh to leg to foot. She underwent MRI and US which diagnosed hematoma in the posterior Rt thigh. Discoloration is resolving but she continues to have bruising in the lateral foot and toes but otherwise is resolving. She has continues swelling in the Rt LE as well as some pain in the leg. She feels generally weak and is being treated for anemia.     Pertinent History  cervical disc surgery Lt 10/19; chronic pain; neck and shoulder pain;  shoulder dysfunction; bilat THA; bilat TKA; arthritis; headaches    Diagnostic tests  MRI     Patient Stated Goals  walk better and get leg feeling better     Currently in Pain?  Yes    Pain Score  7     Pain Location  Leg    Pain Orientation  Right    Pain Descriptors / Indicators  Aching;Burning    Pain Type  Acute pain    Pain Radiating Towards  hip to calf and foot - worse from knee down     Pain Onset  1 to 4 weeks ago    Pain Frequency  Intermittent   present most of the time    Aggravating Factors   standing; walking; bending; reaching; stairs; any physical activity     Pain Relieving Factors  rest          [facility] - 05/31/18 0001      Assessment   Medical Diagnosis  Rt leg pain     Referring Provider (PT)  Dr Clementeen GrahamEvan Corey     Onset Date/Surgical Date  05/11/18   at least three weeks ago    Hand Dominance  Right    Next MD Visit  06/20/2018    Prior  Therapy  here for neck and shoulder       Precautions   Precautions  None      Restrictions   Weight Bearing Restrictions  No      Balance Screen   Has the patient fallen in the past 6 months  No    Has the patient had a decrease in activity level because of a fear of falling?   No    Is the patient reluctant to leave their home because of a fear of falling?   No      Home Environment   Living Environment  Private residence    Living Arrangements  Spouse/significant other    Type of Home  House    Home Access  Level entry    Home Layout  One level      Prior Function   Level of Independence  Independent   with assist of spouse    Vocation  Retired    Leisure  sedentary lifestyle - does some light household chores and laundry - no cooking; shopping/errands       Observation/Other Assessments   Focus on Therapeutic Outcomes (FOTO)   69% limitation       Observation/Other Assessments-Edema    Edema  --   ankles assessed figure 8 - generalized edema Rt > Lt LE      Figure 8 Edema   Figure 8 - Right    65cm    Figure 8 - Left   60 cm       Sensation   Additional Comments  intermittent numbness lower Rt leg       Posture/Postural Control   Posture Comments  head forward; shoulders rounded; flexed forward at hips       AROM   Overall AROM   --   limited by obesity and deconditioning/immobility    Right/Left Hip  --   limited end range in all planes Rt > Lt    Right Knee Extension  -15    Right Knee Flexion  82    Left Knee Extension  -10    Left Knee Flexion  90    Right Ankle Dorsiflexion  0    Left Ankle Dorsiflexion  7      Strength   Right/Left Hip  --   assessed w/pt in supine - unable to lie on side or prone    Right Hip Flexion  3+/5    Right Hip Extension  3-/5    Right Hip ABduction  3+/5    Right Hip ADduction  4-/5    Left Hip Flexion  4-/5    Left Hip Extension  3/5    Left Hip ABduction  4/5    Left Hip ADduction  4/5    Right Knee Flexion  3+/5    Right Knee Extension  3+/5    Left Knee Flexion  4-/5    Left Knee Extension  4-/5    Right Ankle Dorsiflexion  4/5    Left Ankle Dorsiflexion  4+/5      Transfers   Comments  difficulty with all transfers       Ambulation/Gait   Gait Comments  wide based gait; limping each LE; SPC in Rt LE                 Objective measurements completed on examination: See above findings.      [facility] Adult PT Treatment/Exercise - 05/31/18 0001      Knee/Hip  Exercises: Supine   Quad Sets  AROM;Strengthening;Right;Left;10 reps   10 sec hold    Other Supine Knee/Hip Exercises  glut set 10 sec x 10       Moist Heat Therapy   Number Minutes Moist Heat  15 Minutes    Moist Heat Location  Lumbar Spine      Manual Therapy   Manual therapy comments  pt supine LE's supported on bolster     Soft tissue mobilization  retrograde massage Rt foot and ankle     Passive ROM  AAROM Rt foot and toes       Ankle Exercises: Supine   Other Supine Ankle Exercises  ankle pumps; ankle circles; alphabet x 10 reps                    PT Long Term Goals - 05/31/18 1320      PT LONG TERM GOAL #1   Title  Decrease localized edama bilat feet/ankles by 0.5 cm 07/12/2018    Time  6    Period  Weeks    Status  New      PT LONG TERM GOAL #2   Title  Increase strength bilat LE's by 1/2 to 1 muscule grade therefore improving functional mobility and transfers/gait 07/12/2018    Time  6    Period  Weeks    Status  New      PT LONG TERM GOAL #3   Title  Improve gait pattern with patient to demonstrate more stable gait with SPC to improve safety and decrease risk of falling 07/12/2018    Time  6    Period  Weeks    Status  New      PT LONG TERM GOAL #4   Title  Independent in HEP 07/12/2018    Time  6    Period  Weeks    Status  New      PT LONG TERM GOAL #5   Title  Improve FOTO 52% limitation 07/12/2018    Time  6    Period  Weeks    Status  New             Plan - 05/31/18 1309    Clinical Impression Statement  Deryn presents s/p hematoma poserior/medial Rt thigh with resolving ecchymosis and edema throughout the Rt LE. She continues to have edema Rt leg and ankle/foot. Patient has limited LE mobility, ROM, strength with marked edema Rt > Lt. She will benefit from PT to address problems identified.     History and Personal Factors relevant to plan of care:  bilat THA; bilat TKA; chronic LBP; cervical disc surgery 10-19; bilat shoulder dysfunction; obesity; arthritis; multiple medical problems; sedentary lifestyle    Clinical Presentation  Evolving    Clinical Decision Making  Low    Rehab Potential  Good    PT Frequency  2x / week    PT Duration  6 weeks    PT Treatment/Interventions  Patient/family education;ADLs/Self Care Home Management;Cryotherapy;Electrical Stimulation;Iontophoresis /ml Dexamethasone;Moist Heat;Ultrasound;Dry needling;Manual techniques;Neuromuscular re-education;Balance training;Gait training;Functional mobility training;Therapeutic activities;Therapeutic exercise     PT Next Visit Plan  review HEP; prgress with LE strengthening and stretching; retrograde massage; education; home instruction; modaliites. Further evaluation of Rt elbow as indicated     PT Home Exercise Plan   Access Code: [email or web address]     Consulted and Agree with Plan of Care  Patient;Family member/caregiver    Family Member Consulted  husband Dorene Sorrow  Patient will benefit from skilled therapeutic intervention in order to improve the following deficits and impairments:  Postural dysfunction, Improper body mechanics, Pain, Increased fascial restricitons, Increased muscle spasms, Decreased mobility, Decreased range of motion, Decreased strength, Decreased activity tolerance, Abnormal gait  Visit Diagnosis: Localized edema - Plan: PT plan of care cert/re-cert  Pain of left lower extremity - Plan: PT plan of care cert/re-cert  Other symptoms and signs involving the musculoskeletal system - Plan: PT plan of care cert/re-cert  Weakness generalized - Plan: PT plan of care cert/re-cert     Problem List Patient Active Problem List   Diagnosis Date Noted  . Mass of thigh, right 05/17/2018  . 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 Status post cervical spinal fusion 01/11/2018  . Neck pain 10/20/2017  . Chronic venous insufficiency 04/29/2017  . 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HCC) 02/12/2015  . Type 2 diabetes mellitus without complication (HCC) 08/10/2014  . Chronic constipation 02/19/2014  . Rosacea 11/06/2013  . GAD (generalized anxiety disorder) 06/08/2013  . Essential hypertension, benign 05/04/2013  . Migraine with aura 05/04/2013  . Palpitations 05/04/2013  . Murmur, heart 05/04/2013  . Hyperlipidemia 05/04/2013  . Hypothyroidism 05/04/2013  . Fatigue 02/16/2012  . Obesity, morbid (HCC) 02/16/2012  . Urge incontinence 02/16/2012  . Hyperglycemia 01/12/2012    Seaira Byus Rober Minion PT, MPH   05/31/2018, 1:37 PM  [facility] [address] [place], [place], [address] Phone: [phone]   Fax:  [phone]  Name: Joann Maddox MRN: [id number] Date of Birth: 09/29/1940

## 2018-06-02 ENCOUNTER — Encounter: Payer: Medicare Other | Admitting: Rehabilitative and Restorative Service Providers"

## 2018-06-06 ENCOUNTER — Encounter: Payer: Medicare Other | Admitting: Rehabilitative and Restorative Service Providers"

## 2018-06-09 ENCOUNTER — Encounter: Payer: Self-pay | Admitting: Rehabilitative and Restorative Service Providers"

## 2018-06-09 ENCOUNTER — Ambulatory Visit (INDEPENDENT_AMBULATORY_CARE_PROVIDER_SITE_OTHER): Payer: Medicare Other | Admitting: Rehabilitative and Restorative Service Providers"

## 2018-06-09 DIAGNOSIS — R531 Weakness: Secondary | ICD-10-CM

## 2018-06-09 DIAGNOSIS — M79605 Pain in left leg: Secondary | ICD-10-CM

## 2018-06-09 DIAGNOSIS — R600 Localized edema: Secondary | ICD-10-CM

## 2018-06-09 DIAGNOSIS — R29898 Other symptoms and signs involving the musculoskeletal system: Secondary | ICD-10-CM

## 2018-06-09 NOTE — Patient Instructions (Signed)
Access Code: [email or web address]  URL: [email or web address]  Date: 06/09/2018  Prepared by: Corlis Leak   Exercises  Ankle Pumps in Elevation - 10 reps - 2 sets - 2x daily - 7x weekly  Ankle Circles in Elevation - 10 reps - 2 sets - 2x daily - 7x weekly  Ankle Alphabet in Elevation - 2 reps - 1 sets - 2x daily - 7x weekly  Supine Quad Set - 10 reps - 1 sets - 10 sec hold - 2x daily - 7x weekly  Supine Gluteal Sets - 10 reps - 1 sets - 10 sec hold - 2x daily - 7x weekly   Today  Hooklying Hamstring Stretch with Strap - 3 reps - 1 sets - 30 sec hold - 2x daily - 7x weekly  Reclined Shoulder Abduction Adduction with Free Weight - 3 reps - 1 sets - 30 sec hold - 2x daily - 7x weekly  Supine Shoulder Flexion AAROM - 10 reps - 1 sets - 3 sec hold - 2x daily - 7x weekly  Supine Elbow Extension Stretch in Supination - 3 reps - 1 sets - 30 sec hold - 2x daily - 7x weekly  Bridge - 10 reps - 1 sets - 3 sec hold - 2x daily - 7x weekly  Hooklying Isometric Clamshell - 10 reps - 2 sets - 2 sec hold - 2x daily - 7x weekly

## 2018-06-09 NOTE — Therapy (Signed)
[facility] [address] [place], [place], [address] Phone: [phone]   Fax:  [phone]  Physical Therapy Treatment  Patient Details  Name: Joann Maddox MRN: [id number] Date of Birth: 07/14/1940 Referring Provider (PT): Dr Clementeen GrahamEvan Corey    Encounter Date: 06/09/2018  PT End of Session - 06/09/18 1454    Visit Number  2    Number of Visits  12    Date for PT Re-Evaluation  07/12/18    PT Start Time  1452    PT Stop Time  1531    PT Time Calculation (min)  39 min    Activity Tolerance  Patient tolerated treatment well       Past Medical History:  Diagnosis Date  . Anxiety   . Arthritis   . Depression   . Headache(784.0)    migrains  . Heart murmur   . Hypothyroidism   . Pre-diabetes   . Rheumatoid arthritis(714.0)     Past Surgical History:  Procedure Laterality Date  . ABDOMINAL HYSTERECTOMY    . ANTERIOR CERVICAL DECOMP/DISCECTOMY FUSION N/A 01/03/2018   Procedure: ANTERIOR CERVICAL DECOMPRESSION/DISCECTOMY FUSION, ALLOGRAFT, PLATE;  Surgeon: Eldred MangesYates, Mark C, MD;  Location: [facility] OR;  Service: Orthopedics;  Laterality: N/A;  . APPENDECTOMY    . CHOLECYSTECTOMY    . REPLACEMENT TOTAL KNEE BILATERAL    . TONSILLECTOMY     as  a child  . TOTAL HIP ARTHROPLASTY     right and left    There were no vitals filed for this visit.  Subjective Assessment - 06/09/18 1455    Subjective  Patient reports that most of the bruising has resolved. She has been doing more walking and some of her exercises. She does not feel great today - maybe due to the rainy weather.     Currently in Pain?  Yes    Pain Score  4     Pain Location  Leg    Pain Orientation  Right    Pain Descriptors / Indicators  Aching    Pain Type  Acute pain    Pain Frequency  Intermittent                       [facility] Adult PT Treatment/Exercise - 06/09/18 0001      Knee/Hip Exercises: Stretches   Passive Hamstring Stretch  3 reps;20  seconds   supine with strap - toes to nose for calf stretch      Knee/Hip Exercises: Supine   Quad Sets  AROM;Strengthening;Right;Left;10 reps   10 sec hold    Bridges  Strengthening;Both;10 reps   1-2 sec hold through partial range    Other Supine Knee/Hip Exercises  glut set 10 sec x 10     Other Supine Knee/Hip Exercises  alternate hip abductin in hooklying green TB x 10 each LE       Shoulder Exercises: Supine   Flexion  AAROM;Both;10 reps    Other Supine Exercises  elbow extension with UE's at side 5 sec hold x 10 reps     Other Supine Exercises  prolonged snow angel shoulders ~ 45 deg abd arms resting on table 30-60 sec hold x 3 reps       Moist Heat Therapy   Number Minutes Moist Heat  15 Minutes    Moist Heat Location  Lumbar Spine   during LE exercises      Manual Therapy   Manual therapy comments  pt supine LE's supported on bolster     Soft tissue mobilization  retrograde massage Rt foot and ankle     Passive ROM  AAROM Rt foot and toes       Ankle Exercises: Supine   Other Supine Ankle Exercises  ankle pumps; ankle circles x 10 each; alphabet x 1 reps              PT Education - 06/09/18 1509    Education Details  HEP     Person(s) Educated  Patient    Methods  Explanation;Demonstration;Tactile cues;Verbal cues;Handout    Comprehension  Verbalized understanding;Returned demonstration;Verbal cues required;Tactile cues required          PT Long Term Goals - 05/31/18 1320      PT LONG TERM GOAL #1   Title  Decrease localized edama bilat feet/ankles by 0.5 cm 07/12/2018    Time  6    Period  Weeks    Status  New      PT LONG TERM GOAL #2   Title  Increase strength bilat LE's by 1/2 to 1 muscule grade therefore improving functional mobility and transfers/gait 07/12/2018    Time  6    Period  Weeks    Status  New      PT LONG TERM GOAL #3   Title  Improve gait pattern with patient to demonstrate more stable gait with SPC to improve safety and  decrease risk of falling 07/12/2018    Time  6    Period  Weeks    Status  New      PT LONG TERM GOAL #4   Title  Independent in HEP 07/12/2018    Time  6    Period  Weeks    Status  New      PT LONG TERM GOAL #5   Title  Improve FOTO 52% limitation 07/12/2018    Time  6    Period  Weeks    Status  New            Plan - 06/09/18 1455    Clinical Impression Statement  Good improvement in Rt LE edema with less pitting edema noted. Visable ant tib noted with ankle DF. Patient added U/LE exercises without difficulty. Progressing well toward stated goals of therapy.     Rehab Potential  Good    PT Frequency  2x / week    PT Duration  6 weeks    PT Treatment/Interventions  Patient/family education;ADLs/Self Care Home Management;Cryotherapy;Electrical Stimulation;Iontophoresis 4mg /ml Dexamethasone;Moist Heat;Ultrasound;Dry needling;Manual techniques;Neuromuscular re-education;Balance training;Gait training;Functional mobility training;Therapeutic activities;Therapeutic exercise    PT Next Visit Plan  review HEP; prgress with LE strengthening and stretching; retrograde massage; education; home instruction; modaliites. Further evaluation of Rt elbow as indicated     PT Home Exercise Plan   Access Code: [email or web address]     Consulted and Agree with Plan of Care  Patient;Family member/caregiver    Family Member Consulted  husband Joann Maddox        Patient will benefit from skilled therapeutic intervention in order to improve the following deficits and impairments:  Postural dysfunction, Improper body mechanics, Pain, Increased fascial restricitons, Increased muscle spasms, Decreased mobility, Decreased range of motion, Decreased strength, Decreased activity tolerance, Abnormal gait  Visit Diagnosis: Localized edema  Pain of left lower extremity  Other symptoms and signs involving the musculoskeletal system  Weakness generalized     Problem List Patient Active Problem List   Diagnosis Date  Noted  .  Mass of thigh, right 05/17/2018  . 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 Status post cervical spinal fusion 01/11/2018  . Neck pain 10/20/2017  . Chronic venous insufficiency 04/29/2017  . 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HCC) 02/12/2015  . Type 2 diabetes mellitus without complication (HCC) 08/10/2014  . Chronic constipation 02/19/2014  . Rosacea 11/06/2013  . GAD (generalized anxiety disorder) 06/08/2013  . Essential hypertension, benign 05/04/2013  . Migraine with aura 05/04/2013  . Palpitations 05/04/2013  . Murmur, heart 05/04/2013  . Hyperlipidemia 05/04/2013  . Hypothyroidism 05/04/2013  . Fatigue 02/16/2012  . Obesity, morbid (HCC) 02/16/2012  . Urge incontinence 02/16/2012  . Hyperglycemia 01/12/2012    Joann Maddox Rober Minion PT, MPH  06/09/2018, 3:49 PM  [facility] [address] [place], [place], [address] Phone: [phone]   Fax:  [phone]  Name: SHONICE CURLING MRN: [id number] Date of Birth: 1940/06/09

## 2018-06-10 ENCOUNTER — Other Ambulatory Visit: Payer: Self-pay | Admitting: Family Medicine

## 2018-06-13 ENCOUNTER — Ambulatory Visit (INDEPENDENT_AMBULATORY_CARE_PROVIDER_SITE_OTHER): Payer: Medicare Other | Admitting: Rehabilitative and Restorative Service Providers"

## 2018-06-13 ENCOUNTER — Encounter: Payer: Self-pay | Admitting: Rehabilitative and Restorative Service Providers"

## 2018-06-13 DIAGNOSIS — R531 Weakness: Secondary | ICD-10-CM

## 2018-06-13 DIAGNOSIS — R29898 Other symptoms and signs involving the musculoskeletal system: Secondary | ICD-10-CM

## 2018-06-13 DIAGNOSIS — M79605 Pain in left leg: Secondary | ICD-10-CM

## 2018-06-13 DIAGNOSIS — R600 Localized edema: Secondary | ICD-10-CM

## 2018-06-13 NOTE — Patient Instructions (Signed)
Access Code: [email or web address]  URL: [email or web address]  Date: 06/13/2018  Prepared by: Corlis Leak   Exercises  Ankle Pumps in Elevation - 10 reps - 2 sets - 2x daily - 7x weekly  Ankle Circles in Elevation - 10 reps - 2 sets - 2x daily - 7x weekly  Ankle Alphabet in Elevation - 2 reps - 1 sets - 2x daily - 7x weekly  Supine Quad Set - 10 reps - 1 sets - 10 sec hold - 2x daily - 7x weekly  Supine Gluteal Sets - 10 reps - 1 sets - 10 sec hold - 2x daily - 7x weekly  Hooklying Hamstring Stretch with Strap - 3 reps - 1 sets - 30 sec hold - 2x daily - 7x weekly  Reclined Shoulder Abduction Adduction with Free Weight - 3 reps - 1 sets - 30 sec hold - 2x daily - 7x weekly  Supine Shoulder Flexion AAROM - 10 reps - 1 sets - 3 sec hold - 2x daily - 7x weekly  Supine Elbow Extension Stretch in Supination - 3 reps - 1 sets - 30 sec hold - 2x daily - 7x weekly  Bridge - 10 reps - 1 sets - 3 sec hold - 2x daily - 7x weekly  Hooklying Isometric Clamshell - 10 reps - 2 sets - 2 sec hold - 2x daily - 7x weekly   Today  Standing Balance in Corner - 3 reps - 1 sets - 2 min hold - 2x daily - 7x weekly  Mini Squat - 10 reps - 1 sets - 10 sec hold - 2x daily - 7x weekly  Sit to Stand - 10 reps - 1 sets - 3 sec hold - 2x daily - 7x weekly  Standing March with Counter Support - 10 reps - 1 sets - 2 sec hold - 2x daily - 7x weekly

## 2018-06-13 NOTE — Therapy (Signed)
[facility] [address] [place], [place], [address] Phone: [phone]   Fax:  [phone]  Physical Therapy Treatment  Patient Details  Name: Joann Maddox MRN: [id number] Date of Birth: 1941/02/16 Referring Provider (PT): Dr Clementeen Graham    Encounter Date: 06/13/2018  PT End of Session - 06/13/18 1444    Visit Number  3    Number of Visits  12    Date for PT Re-Evaluation  07/12/18    PT Start Time  1442    PT Stop Time  1525   moist heat LB during UE ROM/ex    PT Time Calculation (min)  43 min    Activity Tolerance  Patient tolerated treatment well       Past Medical History:  Diagnosis Date  . Anxiety   . Arthritis   . Depression   . Headache(784.0)    migrains  . Heart murmur   . Hypothyroidism   . Pre-diabetes   . Rheumatoid arthritis(714.0)     Past Surgical History:  Procedure Laterality Date  . ABDOMINAL HYSTERECTOMY    . ANTERIOR CERVICAL DECOMP/DISCECTOMY FUSION N/A 01/03/2018   Procedure: ANTERIOR CERVICAL DECOMPRESSION/DISCECTOMY FUSION, ALLOGRAFT, PLATE;  Surgeon: Eldred Manges, MD;  Location: [facility] OR;  Service: Orthopedics;  Laterality: N/A;  . APPENDECTOMY    . CHOLECYSTECTOMY    . REPLACEMENT TOTAL KNEE BILATERAL    . TONSILLECTOMY     as  a child  . TOTAL HIP ARTHROPLASTY     right and left    There were no vitals filed for this visit.  Subjective Assessment - 06/13/18 1444    Subjective  Leg is feeling better. Arms are still and tight and they hurt. She is walking more at home and using the cane much at home - confident with doing this at home.     Currently in Pain?  Yes    Pain Score  2     Pain Location  Leg    Pain Orientation  Right    Pain Descriptors / Indicators  Dull;Nagging         [facility] PT Assessment - 06/13/18 0001      Assessment   Medical Diagnosis  Rt leg pain     Referring Provider (PT)  Dr Clementeen Graham     Onset Date/Surgical Date  05/11/18   at least three  weeks ago    Hand Dominance  Right    Next MD Visit  06/20/2018    Prior Therapy  here for neck and shoulder       AROM   Right Elbow Extension  -32    Left Elbow Extension  -45      PROM   Right Elbow Extension  -17    Left Elbow Extension  -33                   [facility] Adult PT Treatment/Exercise - 06/13/18 0001      Knee/Hip Exercises: Standing   Wall Squat Limitations  wall slide walker in front fro safety x 10     Other Standing Knee Exercises  hip extensin standing at wall walker in front for safety back against wall 10 sec hold x 10     Other Standing Knee Exercises  wt shift with opposite heel lift walker in front for safety x 10; marching with walker in front x 10 each LE       Knee/Hip Exercises: Seated  Sit to Sand  2 sets;5 reps;without UE support   ~ 28 inch surface - verbal cues for fwd and up      Knee/Hip Exercises: Supine   Bridges  Strengthening;Both;10 reps   1-2 sec hold through partial range    Other Supine Knee/Hip Exercises  glut set 10 sec x 10     Other Supine Knee/Hip Exercises  alternate hip abductin in hooklying green TB x 10 each LE       Shoulder Exercises: Stretch   Other Shoulder Stretches  stretch into elbow extension PROM 20 sec hold x 3 reps; contract relax 5 sec x 3 reps - bilat UE's pt in supine       Moist Heat Therapy   Number Minutes Moist Heat  15 Minutes    Moist Heat Location  Lumbar Spine   during LE exercises      Manual Therapy   Manual therapy comments  pt supine LE's supported on bolster     Soft tissue mobilization  bilat biceps to anterior shoulder     Passive ROM  AAROM bilat elbow extension 20 sec x 3 reps each side with end range stretch              PT Education - 06/13/18 1507    Education Details  HEP     Person(s) Educated  Patient    Methods  Explanation;Demonstration;Tactile cues;Verbal cues;Handout    Comprehension  Verbalized understanding;Returned demonstration;Verbal cues required;Tactile  cues required          PT Long Term Goals - 06/13/18 1539      PT LONG TERM GOAL #1   Title  Decrease localized edama bilat feet/ankles by 0.5 cm 07/12/2018    Time  6    Period  Weeks    Status  On-going      PT LONG TERM GOAL #2   Title  Increase strength bilat LE's by 1/2 to 1 muscule grade therefore improving functional mobility and transfers/gait 07/12/2018    Time  6    Period  Weeks    Status  On-going      PT LONG TERM GOAL #3   Title  Improve gait pattern with patient to demonstrate more stable gait with SPC to improve safety and decrease risk of falling 07/12/2018    Time  6    Period  Weeks    Status  On-going      PT LONG TERM GOAL #4   Title  Independent in HEP 07/12/2018    Time  6    Period  Weeks    Status  On-going      PT LONG TERM GOAL #5   Title  Improve FOTO 52% limitation 07/12/2018    Time  6    Period  Weeks    Status  On-going      PT LONG TERM GOAL #6   Title  Increase AROM bilat elbow extension to (-) 8-15 degrees therefore improving UE function 07/12/2018     Time  5    Period  Weeks    Status  New            Plan - 06/13/18 1444    Clinical Impression Statement  Progressing well with strengthening today; added exercises for home. Working on UE tightness and ROM as well. Progressing well toward stated goals of therapy.     Rehab Potential  Good    PT Frequency  2x / week  PT Duration  6 weeks    PT Treatment/Interventions  Patient/family education;ADLs/Self Care Home Management;Cryotherapy;Electrical Stimulation;Iontophoresis 4mg /ml Dexamethasone;Moist Heat;Ultrasound;Dry needling;Manual techniques;Neuromuscular re-education;Balance training;Gait training;Functional mobility training;Therapeutic activities;Therapeutic exercise    PT Next Visit Plan  review HEP; prgress with LE strengthening and stretching; retrograde massage; education; home instruction; modaliites. Further evaluation of Rt elbow as indicated     PT Home Exercise Plan    Access Code: [email or web address]     Consulted and Agree with Plan of Care  Patient;Family member/caregiver    Family Member Consulted  husband Dorene SorrowJerry        Patient will benefit from skilled therapeutic intervention in order to improve the following deficits and impairments:  Postural dysfunction, Improper body mechanics, Pain, Increased fascial restricitons, Increased muscle spasms, Decreased mobility, Decreased range of motion, Decreased strength, Decreased activity tolerance, Abnormal gait  Visit Diagnosis: Localized edema - Plan: PT plan of care cert/re-cert  Pain of left lower extremity - Plan: PT plan of care cert/re-cert  Other symptoms and signs involving the musculoskeletal system - Plan: PT plan of care cert/re-cert  Weakness generalized - Plan: PT plan of care cert/re-cert     Problem List Patient Active Problem List   Diagnosis Date Noted  . Mass of thigh, right 05/17/2018  . 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 Status post cervical spinal fusion 01/11/2018  . Neck pain 10/20/2017  . Chronic venous insufficiency 04/29/2017  . 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HCC) 02/12/2015  . Type 2 diabetes mellitus without complication (HCC) 08/10/2014  . Chronic constipation 02/19/2014  . Rosacea 11/06/2013  . GAD (generalized anxiety disorder) 06/08/2013  . Essential hypertension, benign 05/04/2013  . Migraine with aura 05/04/2013  . Palpitations 05/04/2013  . Murmur, heart 05/04/2013  . Hyperlipidemia 05/04/2013  . Hypothyroidism 05/04/2013  . Fatigue 02/16/2012  . Obesity, morbid (HCC) 02/16/2012  . Urge incontinence 02/16/2012  . Hyperglycemia 01/12/2012    Nikhita Mentzel Rober MinionP Deira Shimer PT, MPH  06/13/2018, 3:44 PM  [facility] [address] [place], [place], [address] Phone: [phone]   Fax:  [phone]  Name: Joann Maddox MRN:  [id number] Date of Birth: 06/19/1940

## 2018-06-16 ENCOUNTER — Encounter: Payer: Self-pay | Admitting: Rehabilitative and Restorative Service Providers"

## 2018-06-16 ENCOUNTER — Ambulatory Visit (INDEPENDENT_AMBULATORY_CARE_PROVIDER_SITE_OTHER): Payer: Medicare Other | Admitting: Rehabilitative and Restorative Service Providers"

## 2018-06-16 DIAGNOSIS — M79605 Pain in left leg: Secondary | ICD-10-CM | POA: Diagnosis not present

## 2018-06-16 DIAGNOSIS — R600 Localized edema: Secondary | ICD-10-CM | POA: Diagnosis not present

## 2018-06-16 DIAGNOSIS — R531 Weakness: Secondary | ICD-10-CM

## 2018-06-16 DIAGNOSIS — R29898 Other symptoms and signs involving the musculoskeletal system: Secondary | ICD-10-CM

## 2018-06-16 NOTE — Therapy (Signed)
[facility] [address] [place], [place], [address] Phone: [phone]   Fax:  [phone]  Physical Therapy Treatment  Patient Details  Name: Joann Maddox MRN: [id number] Date of Birth: 01/10/1941 Referring Provider (PT): Dr Clementeen GrahamEvan Corey    Encounter Date: 06/16/2018  PT End of Session - 06/16/18 1502    Visit Number  4    Number of Visits  12    Date for PT Re-Evaluation  07/12/18    PT Start Time  1445    PT Stop Time  1528   moist heat x 15 min during LE exercises    PT Time Calculation (min)  43 min    Activity Tolerance  Patient tolerated treatment well       Past Medical History:  Diagnosis Date  . Anxiety   . Arthritis   . Depression   . Headache(784.0)    migrains  . Heart murmur   . Hypothyroidism   . Pre-diabetes   . Rheumatoid arthritis(714.0)     Past Surgical History:  Procedure Laterality Date  . ABDOMINAL HYSTERECTOMY    . ANTERIOR CERVICAL DECOMP/DISCECTOMY FUSION N/A 01/03/2018   Procedure: ANTERIOR CERVICAL DECOMPRESSION/DISCECTOMY FUSION, ALLOGRAFT, PLATE;  Surgeon: Eldred MangesYates, Mark C, MD;  Location: [facility] OR;  Service: Orthopedics;  Laterality: N/A;  . APPENDECTOMY    . CHOLECYSTECTOMY    . REPLACEMENT TOTAL KNEE BILATERAL    . TONSILLECTOMY     as  a child  . TOTAL HIP ARTHROPLASTY     right and left    There were no vitals filed for this visit.  Subjective Assessment - 06/16/18 1508    Subjective  Patient is feeling OK - some increased pain today which she feels is related to the weather.     Currently in Pain?  No/denies    Pain Location  Leg    Pain Orientation  Right    Pain Descriptors / Indicators  Tender    Pain Type  Acute pain    Pain Score  7    Pain Location  Arm    Pain Orientation  Right;Left    Pain Descriptors / Indicators  Sharp;Dull   sharp with movement; dull with rest    Pain Type  Chronic pain    Pain Radiating Towards  elbows to wrists     Pain Onset  More  than a month ago    Pain Frequency  Constant    Aggravating Factors   using arms     Pain Relieving Factors  rest                        [facility] Adult PT Treatment/Exercise - 06/16/18 0001      Knee/Hip Exercises: Aerobic   Nustep  L3 x 5 min LE only       Knee/Hip Exercises: Supine   Quad Sets  AROM;Strengthening;Right;Left;10 reps    Bridges  Strengthening;Both;20 reps   1-2 sec hold through partial range    Straight Leg Raises  Strengthening;Right;Left;10 reps    Other Supine Knee/Hip Exercises  marching alternating LE's x 20     Other Supine Knee/Hip Exercises  alternate hip abductin in hooklying green TB x 10 each LE       Moist Heat Therapy   Number Minutes Moist Heat  15 Minutes    Moist Heat Location  Lumbar Spine   during LE exercises: bilat arms - elbow to wrist  Ankle Exercises: Supine   Other Supine Ankle Exercises  ankle pumps; ankle circles x 10 each; alphabet x 1 reps                   PT Long Term Goals - 06/13/18 1539      PT LONG TERM GOAL #1   Title  Decrease localized edama bilat feet/ankles by 0.5 cm 07/12/2018    Time  6    Period  Weeks    Status  On-going      PT LONG TERM GOAL #2   Title  Increase strength bilat LE's by 1/2 to 1 muscule grade therefore improving functional mobility and transfers/gait 07/12/2018    Time  6    Period  Weeks    Status  On-going      PT LONG TERM GOAL #3   Title  Improve gait pattern with patient to demonstrate more stable gait with SPC to improve safety and decrease risk of falling 07/12/2018    Time  6    Period  Weeks    Status  On-going      PT LONG TERM GOAL #4   Title  Independent in HEP 07/12/2018    Time  6    Period  Weeks    Status  On-going      PT LONG TERM GOAL #5   Title  Improve FOTO 52% limitation 07/12/2018    Time  6    Period  Weeks    Status  On-going      PT LONG TERM GOAL #6   Title  Increase AROM bilat elbow extension to (-) 8-15 degrees therefore  improving UE function 07/12/2018     Time  5    Period  Weeks    Status  New            Plan - 06/16/18 1503    Clinical Impression Statement  Progressing well with LE strengthening with good improvement in edema Rt LE. Patient has continued pain and decresaed endurance limiting exercise but she is progressing each visit. Added 5 min on Nustep today. Elbow ROM is limited bilat and patient has increased pain in the elbows and wrists today and was unable to work with ROM today. She is progressing well with rehab.     Rehab Potential  Good    PT Frequency  2x / week    PT Duration  6 weeks    PT Treatment/Interventions  Patient/family education;ADLs/Self Care Home Management;Cryotherapy;Electrical Stimulation;Iontophoresis 4mg /ml Dexamethasone;Moist Heat;Ultrasound;Dry needling;Manual techniques;Neuromuscular re-education;Balance training;Gait training;Functional mobility training;Therapeutic activities;Therapeutic exercise    PT Next Visit Plan  review HEP; prgress with LE strengthening and stretching; retrograde massage; education; home instruction; modaliites. Work on ROM Rt/Lt elbow     PT Home Exercise Plan   Access Code: [email or web address]     Consulted and Agree with Plan of Care  Patient;Family member/caregiver    Family Member Consulted  husband Dorene Sorrow        Patient will benefit from skilled therapeutic intervention in order to improve the following deficits and impairments:  Postural dysfunction, Improper body mechanics, Pain, Increased fascial restricitons, Increased muscle spasms, Decreased mobility, Decreased range of motion, Decreased strength, Decreased activity tolerance, Abnormal gait  Visit Diagnosis: Localized edema  Pain of left lower extremity  Other symptoms and signs involving the musculoskeletal system  Weakness generalized     Problem List Patient Active Problem List   Diagnosis Date Noted  . Mass of thigh, right  05/17/2018  . Primary osteoarthritis, left  shoulder 03/11/2018  . Status post cervical spinal fusion 01/11/2018  . Neck pain 10/20/2017  . Chronic venous insufficiency 04/29/2017  . 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HCC) 02/12/2015  . Type 2 diabetes mellitus without complication (HCC) 08/10/2014  . Chronic constipation 02/19/2014  . Rosacea 11/06/2013  . GAD (generalized anxiety disorder) 06/08/2013  . Essential hypertension, benign 05/04/2013  . Migraine with aura 05/04/2013  . Palpitations 05/04/2013  . Murmur, heart 05/04/2013  . Hyperlipidemia 05/04/2013  . Hypothyroidism 05/04/2013  . Fatigue 02/16/2012  . Obesity, morbid (HCC) 02/16/2012  . Urge incontinence 02/16/2012  . Hyperglycemia 01/12/2012    Antonio Creswell P Sriya Kroeze Pt, MPH  06/16/2018, 3:15 PM  [facility] [address] [place], [place], [address] Phone: [phone]   Fax:  [phone]  Name: ZIVAH BARNHILL MRN: [id number] Date of Birth: 01-25-1941

## 2018-06-20 ENCOUNTER — Ambulatory Visit (INDEPENDENT_AMBULATORY_CARE_PROVIDER_SITE_OTHER): Payer: Medicare Other

## 2018-06-20 ENCOUNTER — Ambulatory Visit (INDEPENDENT_AMBULATORY_CARE_PROVIDER_SITE_OTHER): Payer: Medicare Other | Admitting: Family Medicine

## 2018-06-20 ENCOUNTER — Encounter: Payer: Self-pay | Admitting: Family Medicine

## 2018-06-20 VITALS — BP 130/72 | HR 84 | Wt 206.0 lb

## 2018-06-20 DIAGNOSIS — M25522 Pain in left elbow: Secondary | ICD-10-CM | POA: Diagnosis not present

## 2018-06-20 DIAGNOSIS — S7011XD Contusion of right thigh, subsequent encounter: Secondary | ICD-10-CM

## 2018-06-20 DIAGNOSIS — M25521 Pain in right elbow: Secondary | ICD-10-CM | POA: Diagnosis not present

## 2018-06-20 DIAGNOSIS — R269 Unspecified abnormalities of gait and mobility: Secondary | ICD-10-CM | POA: Diagnosis not present

## 2018-06-20 NOTE — Progress Notes (Signed)
Joann Maddox is a 78 y.o. female who [facility] [place] today for leg pain,  bilateral elbow pain.  Patient suffered leg pain on the right side.  I first saw her on January 3.  She was found to have a significant hematoma due to hip adductor tear.  She has been receiving physical therapy and notes significant improvement in her pain.  She does continue to have significant difficulty with gait.  Both her and her husband notes that she is developed a short broad-based shuffling gait since early January.  Additionally she has bilateral elbow pain.  She was last seen on February 3 and thought to have tennis elbow.  She received some physical therapy but notes it has not helped.  She notes pain and lack of range of motion in her elbows bilaterally.  ROS as above:  Exam:  BP 130/72   Pulse 84   Wt 206 lb (93.4 kg)   BMI 40.23 kg/m  Wt Readings from Last 5 Encounters:  06/20/18 206 lb (93.4 kg)  05/30/18 208 lb (94.3 kg)  05/23/18 209 lb (94.8 kg)  05/16/18 205 lb (93 kg)  05/09/18 209 lb (94.8 kg)    Gen: Well NAD HEENT: EOMI,  MMM Lungs: Normal work of breathing. CTABL Heart: RRR no MRG Abd: NABS, Soft. Nondistended, Nontender Exts: Brisk capillary refill, warm and well perfused.  Right thigh: Nontender normal motion. Right elbow: Normal-appearing range of motion full flexion lacks 5 degrees of extension and full supination. Tender to palpation at lateral epicondyle.  Some pain with resisted wrist extension.  Left elbow: Normal-appearing.  Range of motion lacks 5 degrees of extension and full supination.  Tender palpation lateral epicondyle.  Some pain resisted wrist extension.  Pulses cap refill and sensation are intact distally.  Gait: Shuffling broad-based gait. Neuro: No tremor present.  No cogwheeling present  Lab and Radiology Results X-ray  images personally independently reviewed  Right elbow: Severe degenerative changes with loose bodies versus bony overgrowth.  No acute fractures.  Left elbow: Very degenerative changes present.  Await formal radiology review   Assessment and Plan: 78 y.o. female with  Right thigh pain due to muscle tear and hematoma: Significant improvement.  Continue therapy and home exercise program.  Recheck in about 4 weeks.  Elbow pain: Severe degenerative changes on x-ray.  Await formal radiology review.  Plan for diclofenac gel and recheck in about 4 weeks.  Consider injections.  Shuffling broad-based gait: Unclear etiology.  This is been ongoing for a while according to her husband perhaps months or maybe even a year.  Initial exam today not likely Parkinson's or parkinsonian is him.  Would benefit from further work-up perhaps with neurology.  Recheck in 4 weeks.  Follow-up with PCP as scheduled in early March.  PDMP not reviewed this encounter. Orders Placed This Encounter  Procedures  . DG Elbow Complete Right    Standing Status:   Future    Standing Expiration Date:   08/19/2019    Order Specific Question:   Reason for Exam (SYMPTOM  OR DIAGNOSIS REQUIRED)    Answer:   eval elbow pain    Order Specific Question:   Preferred imaging location?    Answer:   [facility]    Order Specific Question:   Radiology Contrast Protocol - do NOT remove file path    Answer:   \\[email or web address]\DXFluoroContrastProtocols.pdf  . DG Elbow Complete Left  Standing Status:   Future    Standing Expiration Date:   08/19/2019    Order Specific Question:   Reason for Exam (SYMPTOM  OR DIAGNOSIS REQUIRED)    Answer:   eval elbow pain    Order Specific Question:   Preferred imaging location?    Answer:   [facility]    Order Specific Question:   Radiology Contrast Protocol - do NOT remove file path    Answer:   \\[email or web address]\Radiant\DXFluoroContrastProtocols.pdf   No  orders of the defined types were placed in this encounter.    Historical information moved to improve visibility of documentation.  Past Medical History:  Diagnosis Date  . Anxiety   . Arthritis   . Depression   . Headache(784.0)    migrains  . Heart murmur   . Hypothyroidism   . Pre-diabetes   . Rheumatoid arthritis(714.0)    Past Surgical History:  Procedure Laterality Date  . ABDOMINAL HYSTERECTOMY    . ANTERIOR CERVICAL DECOMP/DISCECTOMY FUSION N/A 01/03/2018   Procedure: ANTERIOR CERVICAL DECOMPRESSION/DISCECTOMY FUSION, ALLOGRAFT, PLATE;  Surgeon: Eldred Manges, MD;  Location: [facility] OR;  Service: Orthopedics;  Laterality: N/A;  . APPENDECTOMY    . CHOLECYSTECTOMY    . REPLACEMENT TOTAL KNEE BILATERAL    . TONSILLECTOMY     as  a child  . TOTAL HIP ARTHROPLASTY     right and left   Social History   Tobacco Use  . Smoking status: Never Smoker  . Smokeless tobacco: Never Used  Substance Use Topics  . Alcohol use: No   family history includes Diabetes in an other family member; Heart disease (age of onset: 39) in her father; Stroke (age of onset: 97) in her mother.  Medications: Current Outpatient Medications  Medication Sig Dispense Refill  . AMBULATORY NON FORMULARY MEDICATION Walker. Use a needed for leg weakness.  Disp 1 R29.898 1 each 0  . azelaic acid (AZELEX) 20 % cream APPLY TOPICALL 2 TIMES A DAY (MORNING AND EVENING). APPLY AFTER SKIN IS WASHED AND PATTED DRY. 30 g 1  . butalbital-acetaminophen-caffeine (FIORICET, ESGIC) 50-325-40 MG tablet Take 1 tablet by mouth 2 (two) times daily as needed. (Patient taking differently: Take 1 tablet by mouth 2 (two) times daily as needed for migraine. ) 14 tablet 5  . docusate sodium (COLACE) 100 MG capsule Take 1 capsule (100 mg total) by mouth 3 (three) times daily as needed. 90 capsule 3  . DULoxetine (CYMBALTA) 60 MG capsule Take 1 capsule (60 mg total) by mouth daily. 90 capsule 3  . folic acid (FOLVITE) 1 MG tablet  TAKE 1 TABLET BY MOUTH EVERY DAY (Patient taking differently: Take 1 mg by mouth daily. ) 90 tablet 2  . furosemide (LASIX) 20 MG tablet TAKE 1 TABLET BY MOUTH EVERY DAY AS NEEDED (Patient taking differently: Take 20 mg by mouth daily as needed for fluid. ) 90 tablet 1  . gabapentin (NEURONTIN) 300 MG capsule One tab PO qHS for a week, then BID for a week, then TID. May double weekly to a max of 3,600mg /day 180 capsule 3  . hydroxychloroquine (PLAQUENIL) 200 MG tablet 2 TABLET WITH FOOD OR MILK ONCE A DAY ORALLY 60 tablet 1  . levothyroxine (SYNTHROID, LEVOTHROID) 25 MCG tablet Take 1 tablet (25 mcg total) by mouth daily. 90 tablet 3  . losartan (COZAAR) 50 MG tablet Take 1 tablet (50 mg total) by mouth daily. 90 tablet 1  . meloxicam (MOBIC) 15 MG tablet TAKE ONE  TABLET BY MOUTH EACH AM WITH BREAKFAST AS NEEDED FOR PAIN 30 tablet 2  . metFORMIN (GLUCOPHAGE) 500 MG tablet TAKE 1 TABLET BY MOUTH 2 TIMES DAILY WITH A MEAL. (Patient taking differently: Take 500 mg by mouth daily. ) 180 tablet 1  . mirabegron ER (MYRBETRIQ) 25 MG TB24 tablet Take 1 tablet (25 mg total) by mouth at bedtime. 90 tablet 1  . Multiple Vitamin (MULTIVITAMIN) tablet Take 1 tablet by mouth daily.    Marland Kitchen nystatin (MYCOSTATIN/NYSTOP) powder APPLY 1 GRAM TOPICALLY 2 (TWO) TIMES DAILY. X 3 WEEKS. 60 g 1  . Omega-3 Fatty Acids (FISH OIL) 1200 MG CAPS Take 1,200 mg by mouth 2 (two) times a week. WITH OMEGA-3 360 MG    . potassium chloride (K-DUR) 10 MEQ tablet TAKE 1 TABLET (10 MEQ TOTAL) BY MOUTH 2 (TWO) TIMES DAILY. 60 tablet 11  . rosuvastatin (CRESTOR) 10 MG tablet Take 1 tablet (10 mg total) by mouth at bedtime. 90 tablet 3  . senna (SENOKOT) 8.6 MG tablet Take 1 tablet by mouth at bedtime.     . traMADol (ULTRAM) 50 MG tablet TAKE 1-2 TABS BY MOUTH EVERY 12 HOURS AS NEEDED FOR PAIN 120 tablet 1  . TURMERIC PO Take 1,000 mg by mouth daily.    . vitamin B-12 (CYANOCOBALAMIN) 100 MCG tablet Take 100 mcg by mouth at bedtime.     .  Vitamin D, Ergocalciferol, (DRISDOL) 50000 units CAPS capsule TAKE 1 CAPSULE (50,000 UNITS TOTAL) BY MOUTH ONCE A WEEK. (Patient taking differently: Take 50,000 Units by mouth every Sunday. ) 12 capsule 2   No current facility-administered medications for this visit.    Allergies  Allergen Reactions  . Iodine Other (See Comments)    Shook violently and passed out.  . Codeine Other (See Comments)    Hallucinations  . Lipitor [Atorvastatin] Other (See Comments)    Myalgias     Discussed warning signs or symptoms. Please see discharge instructions. Patient expresses understanding.

## 2018-06-20 NOTE — Patient Instructions (Signed)
Thank you for coming in today. Use the diclofenac gel as needed  Up to 4x daily.   Let me know if the gel helps or does not help. I can prescribe it.   Recheck in 4 weeks.

## 2018-06-23 ENCOUNTER — Encounter: Payer: Medicare Other | Admitting: Rehabilitative and Restorative Service Providers"

## 2018-06-27 ENCOUNTER — Ambulatory Visit (INDEPENDENT_AMBULATORY_CARE_PROVIDER_SITE_OTHER): Payer: Medicare Other | Admitting: Rehabilitative and Restorative Service Providers"

## 2018-06-27 ENCOUNTER — Encounter: Payer: Self-pay | Admitting: Rehabilitative and Restorative Service Providers"

## 2018-06-27 DIAGNOSIS — R600 Localized edema: Secondary | ICD-10-CM

## 2018-06-27 DIAGNOSIS — R29898 Other symptoms and signs involving the musculoskeletal system: Secondary | ICD-10-CM

## 2018-06-27 DIAGNOSIS — R531 Weakness: Secondary | ICD-10-CM

## 2018-06-27 DIAGNOSIS — M79605 Pain in left leg: Secondary | ICD-10-CM | POA: Diagnosis not present

## 2018-06-27 NOTE — Therapy (Signed)
[facility] [address] [place], [place], [address] Phone: [phone]   Fax:  [phone]  Physical Therapy Treatment  Patient Details  Name: Joann Maddox MRN: [id number] Date of Birth: 12/25/1940 Referring Provider (PT): Dr Clementeen GrahamEvan Corey    Encounter Date: 06/27/2018  PT End of Session - 06/27/18 1409    Visit Number  5    Number of Visits  12    Date for PT Re-Evaluation  07/12/18    PT Start Time  1408    PT Stop Time  1445    PT Time Calculation (min)  37 min    Activity Tolerance  Patient tolerated treatment well       Past Medical History:  Diagnosis Date  . Anxiety   . Arthritis   . Depression   . Headache(784.0)    migrains  . Heart murmur   . Hypothyroidism   . Pre-diabetes   . Rheumatoid arthritis(714.0)     Past Surgical History:  Procedure Laterality Date  . ABDOMINAL HYSTERECTOMY    . ANTERIOR CERVICAL DECOMP/DISCECTOMY FUSION N/A 01/03/2018   Procedure: ANTERIOR CERVICAL DECOMPRESSION/DISCECTOMY FUSION, ALLOGRAFT, PLATE;  Surgeon: Eldred MangesYates, Mark C, MD;  Location: [facility] OR;  Service: Orthopedics;  Laterality: N/A;  . APPENDECTOMY    . CHOLECYSTECTOMY    . REPLACEMENT TOTAL KNEE BILATERAL    . TONSILLECTOMY     as  a child  . TOTAL HIP ARTHROPLASTY     right and left    There were no vitals filed for this visit.  Subjective Assessment - 06/27/18 1410    Subjective  Patient reports that her Rt LE is feeling better. She has less pain in her legs and is now moving more. She forgot her cane today. She is walking more without her cane at home.     Currently in Pain?  No/denies    Pain Score  0-No pain    Pain Score  7    Pain Location  Arm    Pain Orientation  Right;Left    Pain Descriptors / Indicators  Sharp;Dull                       [facility] Adult PT Treatment/Exercise - 06/27/18 0001      Knee/Hip Exercises: Supine   Quad Sets  AROM;Strengthening;Right;Left;10 reps      Shoulder Exercises: Supine   Other Supine Exercises  elbow extension with UE's at side 5 sec hold x 10 reps     Other Supine Exercises  thoracic lift 5 sec x 10       Shoulder Exercises: Stretch   Other Shoulder Stretches  stretch into elbow extension PROM 20 sec hold x 3 reps; contract relax 5 sec x 3 reps - bilat UE's pt in supine     Other Shoulder Stretches  lying supine - prolonged snow angel stretch shds ~ 30-40 deg abduction       Moist Heat Therapy   Number Minutes Moist Heat  15 Minutes    Moist Heat Location  Elbow   bilat      Manual Therapy   Manual therapy comments  pt supine LE's supported on bolster     Soft tissue mobilization  bilat biceps to anterior shoulder     Passive ROM  AAROM bilat elbow extension 20 sec x 3 reps each side with end range stretch       Ankle Exercises: Supine   Other  Supine Ankle Exercises  ankle pumps; ankle circles x 10 each; alphabet x 1 reps              PT Education - 06/27/18 1422    Education Details  HEP     Person(s) Educated  Patient    Methods  Explanation;Demonstration;Tactile cues;Verbal cues;Handout    Comprehension  Verbalized understanding;Returned demonstration;Verbal cues required;Tactile cues required          PT Long Term Goals - 06/13/18 1539      PT LONG TERM GOAL #1   Title  Decrease localized edama bilat feet/ankles by 0.5 cm 07/12/2018    Time  6    Period  Weeks    Status  On-going      PT LONG TERM GOAL #2   Title  Increase strength bilat LE's by 1/2 to 1 muscule grade therefore improving functional mobility and transfers/gait 07/12/2018    Time  6    Period  Weeks    Status  On-going      PT LONG TERM GOAL #3   Title  Improve gait pattern with patient to demonstrate more stable gait with SPC to improve safety and decrease risk of falling 07/12/2018    Time  6    Period  Weeks    Status  On-going      PT LONG TERM GOAL #4   Title  Independent in HEP 07/12/2018    Time  6    Period  Weeks     Status  On-going      PT LONG TERM GOAL #5   Title  Improve FOTO 52% limitation 07/12/2018    Time  6    Period  Weeks    Status  On-going      PT LONG TERM GOAL #6   Title  Increase AROM bilat elbow extension to (-) 8-15 degrees therefore improving UE function 07/12/2018     Time  5    Period  Weeks    Status  New            Plan - 06/27/18 1409    Clinical Impression Statement  Saw Dr Denyse Amass last week and he was pleased with her progress. Wants her to work on her elbows and hands some now - as well as continue to work on her mobility.     Rehab Potential  Good    PT Frequency  2x / week    PT Duration  6 weeks    PT Treatment/Interventions  Patient/family education;ADLs/Self Care Home Management;Cryotherapy;Electrical Stimulation;Iontophoresis 4mg /ml Dexamethasone;Moist Heat;Ultrasound;Dry needling;Manual techniques;Neuromuscular re-education;Balance training;Gait training;Functional mobility training;Therapeutic activities;Therapeutic exercise    PT Next Visit Plan  review HEP; prgress with LE strengthening and stretching; retrograde massage; education; home instruction; modaliites. Work on ROM Rt/Lt elbow     PT Home Exercise Plan   Access Code: [email or web address]     Consulted and Agree with Plan of Care  Patient;Family member/caregiver    Family Member Consulted  husband Dorene Sorrow        Patient will benefit from skilled therapeutic intervention in order to improve the following deficits and impairments:  Postural dysfunction, Improper body mechanics, Pain, Increased fascial restricitons, Increased muscle spasms, Decreased mobility, Decreased range of motion, Decreased strength, Decreased activity tolerance, Abnormal gait  Visit Diagnosis: Localized edema  Pain of left lower extremity  Other symptoms and signs involving the musculoskeletal system  Weakness generalized     Problem List Patient Active Problem List   Diagnosis  Date Noted  . Mass of thigh, right 05/17/2018  .  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 Status post cervical spinal fusion 01/11/2018  . Neck pain 10/20/2017  . Chronic venous insufficiency 04/29/2017  . 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HCC) 02/12/2015  . Type 2 diabetes mellitus without complication (HCC) 08/10/2014  . Chronic constipation 02/19/2014  . Rosacea 11/06/2013  . GAD (generalized anxiety disorder) 06/08/2013  . Essential hypertension, benign 05/04/2013  . Migraine with aura 05/04/2013  . Palpitations 05/04/2013  . Murmur, heart 05/04/2013  . Hyperlipidemia 05/04/2013  . Hypothyroidism 05/04/2013  . Fatigue 02/16/2012  . Obesity, morbid (HCC) 02/16/2012  . Urge incontinence 02/16/2012  . Hyperglycemia 01/12/2012    Joann Maddox Joann Maddox PT, MPH  06/27/2018, 2:41 PM  [facility] [address] [place], [place], [address] Phone: [phone]   Fax:  [phone]  Name: Joann Maddox MRN: [id number] Date of Birth: 06-Jun-1940

## 2018-06-27 NOTE — Patient Instructions (Addendum)
SUPINE Tips A prolonged snow angel     Being in the supine position means to be lying on the back. Lying on the back is the position of least compression on the bones and discs of the spine, and helps to re-align the natural curves of the back. Work toward 2-5 min hold can bend elbows to release stretch for a few seconds as needed     Thoracic Lift    Press shoulders down. Then lift mid-thoracic spine (area between the shoulder blades). Lift the breastbone slightly. Hold _5-10__ seconds. Relax. Repeat __10_ times.   Angels in the [place]: Single Arm can do both arms at the same time     Arms near sides, palms up. Press one arm lightly into floor, slide arm out to side and up alongside head. Keep contact with floor throughout motion. At maximal position, lengthen arm. Hold 2-3___ seconds. Relax. Repeat _10__ times. Slide arm back to start. Repeat with other arm.   Elbow Press    Interlace fingers; bring hands underneath head. Press elbows down. Hold __2-3_ seconds. Relax arms. Repeat __10_ times.  With left arm shoulder and elbow bent with arm resting on surface bring hand back toward ear  Repeat 10 times

## 2018-06-30 ENCOUNTER — Encounter: Payer: Self-pay | Admitting: Rehabilitative and Restorative Service Providers"

## 2018-06-30 ENCOUNTER — Ambulatory Visit (INDEPENDENT_AMBULATORY_CARE_PROVIDER_SITE_OTHER): Payer: Medicare Other | Admitting: Rehabilitative and Restorative Service Providers"

## 2018-06-30 DIAGNOSIS — R531 Weakness: Secondary | ICD-10-CM

## 2018-06-30 DIAGNOSIS — R600 Localized edema: Secondary | ICD-10-CM

## 2018-06-30 DIAGNOSIS — M79605 Pain in left leg: Secondary | ICD-10-CM

## 2018-06-30 DIAGNOSIS — R29898 Other symptoms and signs involving the musculoskeletal system: Secondary | ICD-10-CM | POA: Diagnosis not present

## 2018-06-30 NOTE — Therapy (Signed)
[facility] [address] [place], [place], [address] Phone: [phone]   Fax:  [phone]  Physical Therapy Treatment  Patient Details  Name: Joann Maddox MRN: [id number] Date of Birth: 18-Feb-1941 Referring Provider (PT): Dr Clementeen Graham    Encounter Date: 06/30/2018  PT End of Session - 06/30/18 1453    Visit Number  6    Number of Visits  12    Date for PT Re-Evaluation  07/12/18    PT Start Time  1448    PT Stop Time  1530   moist heat end of treatment    PT Time Calculation (min)  42 min    Activity Tolerance  Patient tolerated treatment well       Past Medical History:  Diagnosis Date  . Anxiety   . Arthritis   . Depression   . Headache(784.0)    migrains  . Heart murmur   . Hypothyroidism   . Pre-diabetes   . Rheumatoid arthritis(714.0)     Past Surgical History:  Procedure Laterality Date  . ABDOMINAL HYSTERECTOMY    . ANTERIOR CERVICAL DECOMP/DISCECTOMY FUSION N/A 01/03/2018   Procedure: ANTERIOR CERVICAL DECOMPRESSION/DISCECTOMY FUSION, ALLOGRAFT, PLATE;  Surgeon: Eldred Manges, MD;  Location: [facility] OR;  Service: Orthopedics;  Laterality: N/A;  . APPENDECTOMY    . CHOLECYSTECTOMY    . REPLACEMENT TOTAL KNEE BILATERAL    . TONSILLECTOMY     as  a child  . TOTAL HIP ARTHROPLASTY     right and left    There were no vitals filed for this visit.  Subjective Assessment - 06/30/18 1453    Subjective  Patient reports that she has noticed increased swelling in both legs in the past couple of days. Pain is about the same. She is trying to walk more without her cane. She drove herself to therapy - which is the first time she has driven in months.     Currently in Pain?  Yes    Pain Score  4     Pain Location  Leg    Pain Orientation  Right    Pain Descriptors / Indicators  Tightness;Tender    Pain Type  Acute pain    Pain Onset  1 to 4 weeks ago    Pain Frequency  Intermittent    Pain Score  7    Pain  Location  Arm    Pain Orientation  Right;Left    Pain Descriptors / Indicators  Dull;Sharp    Pain Type  Chronic pain                       [facility] Adult PT Treatment/Exercise - 06/30/18 0001      Knee/Hip Exercises: Aerobic   Nustep  L4 x 5 min U/LE's (UE 12)       Knee/Hip Exercises: Supine   Quad Sets  AROM;Strengthening;Right;Left;10 reps      Shoulder Exercises: Supine   Other Supine Exercises  elbow extension with UE's at side 5 sec hold x 10 reps       Shoulder Exercises: Standing   Extension  Strengthening;Both;20 reps;Theraband   verbal cues to straighten elbows as much as possible    Theraband Level (Shoulder Extension)  Level 2 (Red)    Row  Strengthening;Both;20 reps;Theraband   verbal cues to straighten elbows as much as possible    Theraband Level (Shoulder Row)  Level 2 (Red)  Moist Heat Therapy   Number Minutes Moist Heat  15 Minutes    Moist Heat Location  Elbow;Lumbar Spine   bilat      Manual Therapy   Manual therapy comments  pt supine LE's supported on bolster     Soft tissue mobilization  bilat biceps to anterior shoulder     Passive ROM  AAROM bilat elbow extension 20 sec x 3 reps each side with end range stretch       Ankle Exercises: Supine   Other Supine Ankle Exercises  ankle pumps; ankle circles x 10 each; alphabet x 1 reps              PT Education - 06/30/18 1508    Education Details  HEP; encouraged patient to resume LE exercises     Person(s) Educated  Patient    Methods  Explanation;Demonstration;Tactile cues;Verbal cues;Handout    Comprehension  Verbalized understanding;Returned demonstration;Verbal cues required;Tactile cues required          PT Long Term Goals - 06/13/18 1539      PT LONG TERM GOAL #1   Title  Decrease localized edama bilat feet/ankles by 0.5 cm 07/12/2018    Time  6    Period  Weeks    Status  On-going      PT LONG TERM GOAL #2   Title  Increase strength bilat LE's by 1/2 to 1  muscule grade therefore improving functional mobility and transfers/gait 07/12/2018    Time  6    Period  Weeks    Status  On-going      PT LONG TERM GOAL #3   Title  Improve gait pattern with patient to demonstrate more stable gait with SPC to improve safety and decrease risk of falling 07/12/2018    Time  6    Period  Weeks    Status  On-going      PT LONG TERM GOAL #4   Title  Independent in HEP 07/12/2018    Time  6    Period  Weeks    Status  On-going      PT LONG TERM GOAL #5   Title  Improve FOTO 52% limitation 07/12/2018    Time  6    Period  Weeks    Status  On-going      PT LONG TERM GOAL #6   Title  Increase AROM bilat elbow extension to (-) 8-15 degrees therefore improving UE function 07/12/2018     Time  5    Period  Weeks    Status  New            Plan - 06/30/18 1453    Clinical Impression Statement  Increased edema bilat ankles/LE's today - reminded pt to work on LE exercises again, which she has not been doing. Patient added nustep for U/LE's and theraband exercises for UE's today.     Rehab Potential  Good    PT Frequency  2x / week    PT Duration  6 weeks    PT Treatment/Interventions  Patient/family education;ADLs/Self Care Home Management;Cryotherapy;Electrical Stimulation;Iontophoresis 4mg /ml Dexamethasone;Moist Heat;Ultrasound;Dry needling;Manual techniques;Neuromuscular re-education;Balance training;Gait training;Functional mobility training;Therapeutic activities;Therapeutic exercise    PT Next Visit Plan  review HEP; prgress with LE strengthening and stretching; retrograde massage; education; home instruction; modaliites. Work on ROM Rt/Lt elbow     PT Home Exercise Plan   Access Code: [email or web address]     Consulted and Agree with Plan of Care  Patient;Family member/caregiver  Family Member Consulted  husband Dorene SorrowJerry        Patient will benefit from skilled therapeutic intervention in order to improve the following deficits and impairments:  Postural  dysfunction, Improper body mechanics, Pain, Increased fascial restricitons, Increased muscle spasms, Decreased mobility, Decreased range of motion, Decreased strength, Decreased activity tolerance, Abnormal gait  Visit Diagnosis: Localized edema  Pain of left lower extremity  Other symptoms and signs involving the musculoskeletal system  Weakness generalized     Problem List Patient Active Problem List   Diagnosis Date Noted  . Mass of thigh, right 05/17/2018  . 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 Status post cervical spinal fusion 01/11/2018  . Neck pain 10/20/2017  . Chronic venous insufficiency 04/29/2017  . 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HCC) 02/12/2015  . Type 2 diabetes mellitus without complication (HCC) 08/10/2014  . Chronic constipation 02/19/2014  . Rosacea 11/06/2013  . GAD (generalized anxiety disorder) 06/08/2013  . Essential hypertension, benign 05/04/2013  . Migraine with aura 05/04/2013  . Palpitations 05/04/2013  . Murmur, heart 05/04/2013  . Hyperlipidemia 05/04/2013  . Hypothyroidism 05/04/2013  . Fatigue 02/16/2012  . Obesity, morbid (HCC) 02/16/2012  . Urge incontinence 02/16/2012  . Hyperglycemia 01/12/2012    Doyne Micke Rober MinionP Semir Brill PT, MPH  06/30/2018, 3:27 PM  [facility] [address] [place], [place], [address] Phone: [phone]   Fax:  [phone]  Name: Joann Maddox MRN: [id number] Date of Birth: 10/24/1940

## 2018-06-30 NOTE — Patient Instructions (Signed)
Access Code: [email or web address]  URL: [email or web address]  Date: 06/30/2018  Prepared by: Corlis Leak   Exercises  Scapular Retraction with Resistance - 10 reps - 3 sets - 2x daily - 7x weekly  Scapular Retraction with Resistance Advanced - 10 reps - 3 sets - 2x daily - 7x weekly

## 2018-07-01 ENCOUNTER — Ambulatory Visit (INDEPENDENT_AMBULATORY_CARE_PROVIDER_SITE_OTHER): Payer: Medicare Other | Admitting: Family Medicine

## 2018-07-01 ENCOUNTER — Encounter: Payer: Self-pay | Admitting: Family Medicine

## 2018-07-01 VITALS — BP 142/56 | HR 73 | Ht 62.0 in | Wt 212.0 lb

## 2018-07-01 DIAGNOSIS — I10 Essential (primary) hypertension: Secondary | ICD-10-CM | POA: Diagnosis not present

## 2018-07-01 DIAGNOSIS — E119 Type 2 diabetes mellitus without complications: Secondary | ICD-10-CM

## 2018-07-01 DIAGNOSIS — M7989 Other specified soft tissue disorders: Secondary | ICD-10-CM

## 2018-07-01 DIAGNOSIS — D649 Anemia, unspecified: Secondary | ICD-10-CM | POA: Diagnosis not present

## 2018-07-01 DIAGNOSIS — W19XXXA Unspecified fall, initial encounter: Secondary | ICD-10-CM

## 2018-07-01 LAB — POCT GLYCOSYLATED HEMOGLOBIN (HGB A1C): Hemoglobin A1C: 5.2 % (ref 4.0–5.6)

## 2018-07-01 MED ORDER — AZELAIC ACID 20 % EX CREA: TOPICAL_CREAM | CUTANEOUS | 6 refills | Status: DC

## 2018-07-01 NOTE — Patient Instructions (Signed)
Weigh yourself daily on her home scale starting with today.  Take your Lasix tab daily along with your potassium.  I want you continue daily until you are down at least 6 pounds on your home scale. Work on cutting back on the candy.

## 2018-07-01 NOTE — Progress Notes (Signed)
Subjective:    CC:   HPI: Diabetes - no hypoglycemic events. No wounds or sores that are not healing well. No increased thirst or urination. Checking glucose at home. Taking medications as prescribed without any side effects.  Hypertension- Pt denies chest pain, SOB, dizziness, or heart palpitations.  Taking meds as directed w/o problems.  Denies medication side effects.    F/U LE edema.  She has been swelling more. Her weight is up 6 lbs from 2.5 weeks ago.  She just feels like particularly her right leg is more swollen and even feels hot at times.  Just takes her furosemide as needed and has not been taking it regularly.  Has been going to physical therapy and feels like it is actually been really helpful for her.  They are now working on her left shoulder as well and try to work on some range of motion and strengthening.  She also fell yesterday.  She was getting out of her car and where the edge of the carport met some gravel she actually tripped and fell she was not using her cane.  Past medical history, Surgical history, Family history not pertinant except as noted below, Social history, Allergies, and medications have been entered into the medical record, reviewed, and corrections made.   Review of Systems: No fevers, chills, night sweats, weight loss, chest pain, or shortness of breath.   Objective:    General: Well Developed, well nourished, and in no acute distress.  Neuro: Alert and oriented x3, extra-ocular muscles intact, sensation grossly intact.  HEENT: Normocephalic, atraumatic  Skin: Warm and dry, no rashes. Cardiac: Regular rate and rhythm, no murmurs rubs or gallops, 2+ LE  extremity edema on both feet and 1+ along anterior shin or right leg. Marland Kitchen  Respiratory: Clear to auscultation bilaterally. Not using accessory muscles, speaking in full sentences.   Impression and Recommendations:    DM - Well controlled. Continue current regimen. Follow up in  4 mo.   HTN -blood  pressure a little elevated but I do feel like she is a little volume overloaded today so hopefully this should come down in the next week as we can get the 6 pounds of fluid off of her.  LE edema -no sign of DVT or cellulitis on exam.  Discussed increasing her diuretic and want her to take it daily for probably the next week.  I want her measure her weights on her home scale and continue to take it daily at least until she has 6 pounds off.  Make sure to take a potassium pill with it.  She just had renal function checked that was normal.  I do want a recheck for her hemoglobin since she did have anemia.  Anemia due to recheck hemoglobin levels.

## 2018-07-02 LAB — CBC
HCT: 32.3 % — ABNORMAL LOW (ref 35.0–45.0)
Hemoglobin: 10.7 g/dL — ABNORMAL LOW (ref 11.7–15.5)
MCH: 29.6 pg (ref 27.0–33.0)
MCHC: 33.1 g/dL (ref 32.0–36.0)
MCV: 89.2 fL (ref 80.0–100.0)
MPV: 9.9 fL (ref 7.5–12.5)
Platelets: 267 10*3/uL (ref 140–400)
RBC: 3.62 10*6/uL — ABNORMAL LOW (ref 3.80–5.10)
RDW: 12.4 % (ref 11.0–15.0)
WBC: 5 10*3/uL (ref 3.8–10.8)

## 2018-07-02 LAB — TSH: TSH: 0.6 mIU/L (ref 0.40–4.50)

## 2018-07-02 LAB — FERRITIN: Ferritin: 49 ng/mL (ref 16–288)

## 2018-07-04 ENCOUNTER — Encounter: Payer: Medicare Other | Admitting: Rehabilitative and Restorative Service Providers"

## 2018-07-07 ENCOUNTER — Encounter: Payer: Medicare Other | Admitting: Rehabilitative and Restorative Service Providers"

## 2018-07-07 ENCOUNTER — Other Ambulatory Visit: Payer: Self-pay | Admitting: Family Medicine

## 2018-07-11 ENCOUNTER — Other Ambulatory Visit: Payer: Self-pay

## 2018-07-11 ENCOUNTER — Encounter: Payer: Self-pay | Admitting: Rehabilitative and Restorative Service Providers"

## 2018-07-11 ENCOUNTER — Ambulatory Visit (INDEPENDENT_AMBULATORY_CARE_PROVIDER_SITE_OTHER): Payer: Medicare Other | Admitting: Rehabilitative and Restorative Service Providers"

## 2018-07-11 DIAGNOSIS — R531 Weakness: Secondary | ICD-10-CM | POA: Diagnosis not present

## 2018-07-11 DIAGNOSIS — R29898 Other symptoms and signs involving the musculoskeletal system: Secondary | ICD-10-CM

## 2018-07-11 DIAGNOSIS — R600 Localized edema: Secondary | ICD-10-CM

## 2018-07-11 DIAGNOSIS — M79605 Pain in left leg: Secondary | ICD-10-CM | POA: Diagnosis not present

## 2018-07-11 NOTE — Therapy (Signed)
[facility] [place] [address] [place], [place], [address] Phone: [phone]   Fax:  [phone]  Physical Therapy Treatment  Patient Details  Name: Joann Maddox MRN: [id number] Date of Birth: [birth year] Referring Provider (PT): Dr Clementeen Graham    Encounter Date: 07/11/2018  PT End of Session - 07/11/18 1441    Visit Number  7    Number of Visits  12    Date for PT Re-Evaluation  07/12/18    PT Start Time  1439    PT Stop Time  1530   moist heat end of treatment    PT Time Calculation (min)  51 min    Activity Tolerance  Patient tolerated treatment well       Past Medical History:  Diagnosis Date  . Anxiety   . Arthritis   . Depression   . Headache(784.0)    migrains  . Heart murmur   . Hypothyroidism   . Pre-diabetes   . Rheumatoid arthritis(714.0)     Past Surgical History:  Procedure Laterality Date  . ABDOMINAL HYSTERECTOMY    . ANTERIOR CERVICAL DECOMP/DISCECTOMY FUSION N/A 01/03/2018   Procedure: ANTERIOR CERVICAL DECOMPRESSION/DISCECTOMY FUSION, ALLOGRAFT, PLATE;  Surgeon: Eldred Manges, MD;  Location: [facility] OR;  Service: Orthopedics;  Laterality: N/A;  . APPENDECTOMY    . CHOLECYSTECTOMY    . REPLACEMENT TOTAL KNEE BILATERAL    . TONSILLECTOMY     as  a child  . TOTAL HIP ARTHROPLASTY     right and left    There were no vitals filed for this visit.      [facility] PT Assessment - 07/11/18 0001      Assessment   Medical Diagnosis  Rt leg pain     Referring Provider (PT)  Dr Clementeen Graham     Onset Date/Surgical Date  05/11/18   at least three weeks ago    Hand Dominance  Right    Next MD Visit  06/20/2018    Prior Therapy  here for neck and shoulder       Ambulation/Gait   Ambulation/Gait  Yes    Ambulation/Gait Assistance  6: Modified independent (Device/Increase time)    Ambulation Distance (Feet)  40 Feet    Assistive device  Straight cane    Gait Pattern  Decreased stride length;Decreased weight  shift to right;Decreased weight shift to left    Ambulation Surface  Level    Gait velocity  slowed    Gait Comments  wide based gait; limping each LE; SPC in Rt LE       Berg Balance Test   Sit to Stand  Able to stand  independently using hands    Standing Unsupported  Able to stand safely 2 minutes    Sitting with Back Unsupported but Feet Supported on Floor or Stool  Able to sit safely and securely 2 minutes    Stand to Sit  Sits safely with minimal use of hands    Transfers  Able to transfer safely, minor use of hands    Standing Unsupported with Eyes Closed  Able to stand 10 seconds safely    Standing Unsupported with Feet Together  Able to place feet together independently and stand 1 minute safely    From Standing, Reach Forward with Outstretched Arm  Can reach forward >12 cm safely (5")    From Standing Position, Pick up Object from Floor  Able to pick up shoe, needs supervision  From Standing Position, Turn to Look Behind Over each Shoulder  Turn sideways only but maintains balance    Turn 360 Degrees  Able to turn 360 degrees safely but slowly    Standing Unsupported, Alternately Place Feet on Step/Stool  Needs assistance to keep from falling or unable to try    Standing Unsupported, One Foot in Front  Loses balance while stepping or standing    Standing on One Leg  Unable to try or needs assist to prevent fall    Total Score  37    Berg comment:  significant risk for falling                    [facility] - 07/11/18 0001      Knee/Hip Exercises: Standing   Hip Flexion  AROM;Right;Left;20 reps;Knee bent   marching UE support on back of chair    Hip Abduction  AROM;Right;Left;10 reps;Knee straight   leading with heel    Hip Extension  Stengthening;Right;Left;10 reps;Knee straight    Functional Squat  1 set;10 reps   pushing hips back    SLS  10 sec x 3 reps eack side UE support at back of chair     Gait Training  side steps at counter 10  ft x 8 reps     Other Standing Knee Exercises  standing - UE work reaching out to side; bend and straighten elbows; punch forward alternating arms x 10 eaxh exercise       Moist Heat Therapy   Number Minutes Moist Heat  15 Minutes    Moist Heat Location  Lumbar Spine             PT Education - 07/11/18 1508    Education Details  HEP     Person(s) Educated  Patient    Methods  Explanation;Demonstration;Tactile cues;Verbal cues;Handout    Comprehension  Verbalized understanding;Returned demonstration;Verbal cues required;Tactile cues required          PT Long Term Goals - 06/13/18 1539      PT LONG TERM GOAL #1   Title  Decrease localized edama bilat feet/ankles by 0.5 cm 07/12/2018    Time  6    Period  Weeks    Status  On-going      PT LONG TERM GOAL #2   Title  Increase strength bilat LE's by 1/2 to 1 muscule grade therefore improving functional mobility and transfers/gait 07/12/2018    Time  6    Period  Weeks    Status  On-going      PT LONG TERM GOAL #3   Title  Improve gait pattern with patient to demonstrate more stable gait with SPC to improve safety and decrease risk of falling 07/12/2018    Time  6    Period  Weeks    Status  On-going      PT LONG TERM GOAL #4   Title  Independent in HEP 07/12/2018    Time  6    Period  Weeks    Status  On-going      PT LONG TERM GOAL #5   Title  Improve FOTO 52% limitation 07/12/2018    Time  6    Period  Weeks    Status  On-going      PT LONG TERM GOAL #6   Title  Increase AROM bilat elbow extension to (-) 8-15 degrees therefore improving UE function 07/12/2018     Time  5    Period  Weeks    Status  New            Plan - 07/11/18 1619    Clinical Impression Statement  Patient reports difficulty with balance due to LE unsteadiness and weakness. She scored 37 on Berg Balance assessment indicating significant risk for falls. Patient initiated exercises in standing to address balance and weakness. Will add  these exercises to POC.     Rehab Potential  Good    PT Frequency  2x / week    PT Duration  6 weeks    PT Treatment/Interventions  Patient/family education;ADLs/Self Care Home Management;Cryotherapy;Electrical Stimulation;Iontophoresis 4mg /ml Dexamethasone;Moist Heat;Ultrasound;Dry needling;Manual techniques;Neuromuscular re-education;Balance training;Gait training;Functional mobility training;Therapeutic activities;Therapeutic exercise    PT Next Visit Plan  review HEP; prgress with LE strengthening and stretching; retrograde massage; education; home instruction; modaliites. Work on ROM Rt/Lt elbow as well as balance and strength bilat LE's.      PT Home Exercise Plan   Access Code: [email or web address]     Consulted and Agree with Plan of Care  Patient;Family member/caregiver    Family Member Consulted  husband Dorene Sorrow        Patient will benefit from skilled therapeutic intervention in order to improve the following deficits and impairments:  Postural dysfunction, Improper body mechanics, Pain, Increased fascial restricitons, Increased muscle spasms, Decreased mobility, Decreased range of motion, Decreased strength, Decreased activity tolerance, Abnormal gait  Visit Diagnosis: Localized edema  Pain of left lower extremity  Other symptoms and signs involving the musculoskeletal system  Weakness generalized     Problem List Patient Active Problem List   Diagnosis Date Noted  . Mass of thigh, right 05/17/2018  . 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 Status post cervical spinal fusion 01/11/2018  . Neck pain 10/20/2017  . Chronic venous insufficiency 04/29/2017  . 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HCC) 02/12/2015  . Type 2 diabetes mellitus without complication (HCC) 08/10/2014  . Chronic constipation 02/19/2014  . Rosacea 11/06/2013  . GAD (generalized anxiety disorder) 06/08/2013  . Essential hypertension,  benign 05/04/2013  . Migraine with aura 05/04/2013  . Palpitations 05/04/2013  . Murmur, heart 05/04/2013  . Hyperlipidemia 05/04/2013  . Hypothyroidism 05/04/2013  . Fatigue 02/16/2012  . Obesity, morbid (HCC) 02/16/2012  . Urge incontinence 02/16/2012  . Hyperglycemia 01/12/2012    Jonuel Butterfield Rober Minion PT, MPH  07/11/2018, 4:22 PM  [facility] [address] [place], [place], [address] Phone: [phone]   Fax:  [phone]  Name: Joann Maddox MRN: [id number] Date of Birth: [birth year]

## 2018-07-11 NOTE — Patient Instructions (Signed)
Standing straight and tall - reach both arms out to the side and clap in front  Then push one arm forward then the other alternating arms Then bend elbows and straighten arms out behind you  10-15 reps for each exercise   Stand straight and tall and bend knees slightly - pushing bottom back 10-20 reps   Stand holding back of chair - march lifting one knee up and then the other alternating feet - x 10-20   Stand holding back of chair - tighten core and hips then bring one leg back behind you with knee straight; repeat with other leg 10 each side 2-3 sets of 10  Then lift one leg out to the side leading with your heel 10 reps then repeat with the other leg 10 each leg then progress to 2-3 sets of 10   Bridging lying on your back - 10 reps progress to 2-3 sets of 10  Clam - lying on back with hips and knees bend, hold one knee still and bring opposite knee to side slowly and slowly return to upright position - working against theraband 10 reps for 2-3 sets of 10   Standing on one leg - 10-20 sec x 4-5 reps each side gradually increasing hold time  Facing kitchen counter - take side steps to one side - then the other

## 2018-07-13 ENCOUNTER — Other Ambulatory Visit: Payer: Self-pay | Admitting: Family Medicine

## 2018-07-14 ENCOUNTER — Ambulatory Visit (INDEPENDENT_AMBULATORY_CARE_PROVIDER_SITE_OTHER): Payer: Medicare Other | Admitting: Rehabilitative and Restorative Service Providers"

## 2018-07-14 ENCOUNTER — Other Ambulatory Visit: Payer: Self-pay

## 2018-07-14 DIAGNOSIS — R29898 Other symptoms and signs involving the musculoskeletal system: Secondary | ICD-10-CM

## 2018-07-14 DIAGNOSIS — R531 Weakness: Secondary | ICD-10-CM | POA: Diagnosis not present

## 2018-07-14 DIAGNOSIS — R600 Localized edema: Secondary | ICD-10-CM | POA: Diagnosis not present

## 2018-07-14 DIAGNOSIS — M79605 Pain in left leg: Secondary | ICD-10-CM

## 2018-07-14 NOTE — Therapy (Addendum)
[place], [place], [address] Phone: [phone]   Fax:  [phone]  Physical Therapy Treatment  Patient Details  Name: Joann Maddox MRN: [id number] Date of Birth: Feb 11, 1941 Referring Provider (PT): Dr Lynne Leader    Encounter Date: 07/14/2018  PT End of Session - 07/14/18 1528    Visit Number  8    Number of Visits  12    Date for PT Re-Evaluation  07/12/18    PT Start Time  1528    PT Stop Time  1620    PT Time Calculation (min)  52 min    Activity Tolerance  Patient tolerated treatment well       Past Medical History:  Diagnosis Date  . Anxiety   . Arthritis   . Depression   . Headache(784.0)    migrains  . Heart murmur   . Hypothyroidism   . Pre-diabetes   . Rheumatoid arthritis(714.0)     Past Surgical History:  Procedure Laterality Date  . ABDOMINAL HYSTERECTOMY    . ANTERIOR CERVICAL DECOMP/DISCECTOMY FUSION N/A 01/03/2018   Procedure: ANTERIOR CERVICAL DECOMPRESSION/DISCECTOMY FUSION, ALLOGRAFT, PLATE;  Surgeon: Marybelle Killings, MD;  Location: [facility];  Service: Orthopedics;  Laterality: N/A;  . APPENDECTOMY    . CHOLECYSTECTOMY    . REPLACEMENT TOTAL KNEE BILATERAL    . TONSILLECTOMY     as  a child  . TOTAL HIP ARTHROPLASTY     right and left    There were no vitals filed for this visit.  Subjective Assessment - 07/14/18 1529    Subjective  Having more swelling in her feet and ankles today and over the past few days. hurts to walk and stand.     Currently in Pain?  Yes    Pain Score  4     Pain Location  Leg    Pain Orientation  Right    Pain Type  Acute pain    Pain Score  6    Pain Location  Arm    Pain Orientation  Right;Left    Pain Type  Chronic pain                       [facility] Adult PT Treatment/Exercise - 07/14/18 0001      Knee/Hip Exercises: Standing   Hip Flexion  AROM;Right;Left;20 reps;Knee bent   marching UE support on back of chair     Functional Squat  1 set;10 reps   pushing hips back    Gait Training  side steps at counter 10 ft x 8 reps: swinging hips fwd/back standing with UE support each side     Other Standing Knee Exercises  standing back to wall - pushing hips away from wall hold 10 sec x 10 reps; mini wall slides x10       Knee/Hip Exercises: Seated   Sit to Sand  10 reps;without UE support      Knee/Hip Exercises: Supine   Quad Sets  AROM;Strengthening;Right;Left;20 reps    Bridges Limitations  glut sets 10 sec x 10     Straight Leg Raises  Strengthening;Right;Left;20 reps    Other Supine Knee/Hip Exercises  marching alternating LE's x 20       Shoulder Exercises: Supine   Other Supine Exercises  elbow extension with UE's at side 5 sec hold x 10 reps       Moist Heat Therapy   Number Minutes Moist  Heat  15 Minutes    Moist Heat Location  Lumbar Spine      Manual Therapy   Manual therapy comments  pt supine LE's supported on bolster     Soft tissue mobilization  bilat biceps to anterior shoulder     Passive ROM  AAROM bilat elbow extension 20 sec x 3 reps each side with end range stretch       Ankle Exercises: Supine   Other Supine Ankle Exercises  ankle pumps; ankle circles x 10 each; alphabet x 1 reps                   PT Long Term Goals - 06/13/18 1539      PT LONG TERM GOAL #1   Title  Decrease localized edama bilat feet/ankles by 0.5 cm 07/12/2018    Time  6    Period  Weeks    Status  On-going      PT LONG TERM GOAL #2   Title  Increase strength bilat LE's by 1/2 to 1 muscule grade therefore improving functional mobility and transfers/gait 07/12/2018    Time  6    Period  Weeks    Status  On-going      PT LONG TERM GOAL #3   Title  Improve gait pattern with patient to demonstrate more stable gait with SPC to improve safety and decrease risk of falling 07/12/2018    Time  6    Period  Weeks    Status  On-going      PT LONG TERM GOAL #4   Title  Independent in HEP  07/12/2018    Time  6    Period  Weeks    Status  On-going      PT LONG TERM GOAL #5   Title  Improve FOTO 52% limitation 07/12/2018    Time  6    Period  Weeks    Status  On-going      PT LONG TERM GOAL #6   Title  Increase AROM bilat elbow extension to (-) 8-15 degrees therefore improving UE function 07/12/2018     Time  5    Period  Weeks    Status  New            Plan - 07/14/18 1529    Clinical Impression Statement  Patient reports increased edema in bilat ankles. She has not worked on exercises consistently at home. Worked on standing balance activities. Also reviewed LE exercises and worked on retrograde massage. Worked on UE stretching and ROM. Encouraged consisitent HEP.     Rehab Potential  Good    PT Frequency  2x / week    PT Duration  6 weeks    PT Treatment/Interventions  Patient/family education;ADLs/Self Care Home Management;Cryotherapy;Electrical Stimulation;Iontophoresis '4mg'$ /ml Dexamethasone;Moist Heat;Ultrasound;Dry needling;Manual techniques;Neuromuscular re-education;Balance training;Gait training;Functional mobility training;Therapeutic activities;Therapeutic exercise    PT Next Visit Plan  review HEP; prgress with LE strengthening and stretching; retrograde massage; education; home instruction; modaliites. Work on ROM Rt/Lt elbow as well as balance and strength bilat LE's.  Will continue treatmetn following COVID19 shutdown.     PT Home Exercise Plan   Access Code: [email or web address]     Consulted and Agree with Plan of Care  Patient;Family member/caregiver    Family Member Consulted  husband Sonia Side        Patient will benefit from skilled therapeutic intervention in order to improve the following deficits and impairments:  Postural dysfunction, Improper body mechanics,  Pain, Increased fascial restricitons, Increased muscle spasms, Decreased mobility, Decreased range of motion, Decreased strength, Decreased activity tolerance, Abnormal gait  Visit Diagnosis: Localized  edema  Pain of left lower extremity  Other symptoms and signs involving the musculoskeletal system  Weakness generalized     Problem List Patient Active Problem List   Diagnosis Date Noted  . Mass of thigh, right 05/17/2018  . 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 Status post cervical spinal fusion 01/11/2018  . Neck pain 10/20/2017  . Chronic venous insufficiency 04/29/2017  . 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[place]) 02/12/2015  . Type 2 diabetes mellitus without complication ([place]) 14/43/6016  . Chronic constipation 02/19/2014  . Rosacea 11/06/2013  . GAD (generalized anxiety disorder) 06/08/2013  . Essential hypertension, benign 05/04/2013  . Migraine with aura 05/04/2013  . Palpitations 05/04/2013  . Murmur, heart 05/04/2013  . Hyperlipidemia 05/04/2013  . Hypothyroidism 05/04/2013  . Fatigue 02/16/2012  . Obesity, morbid ([place]) 02/16/2012  . Urge incontinence 02/16/2012  . Hyperglycemia 01/12/2012    Sione Baumgarten Nilda Simmer PT, MPH  07/14/2018, 4:19 PM  [facility] [place], [place], [address] Phone: [phone]   Fax:  [phone]  Name: Joann Maddox MRN: [id number] Date of Birth: 01/09/1941  PHYSICAL THERAPY DISCHARGE SUMMARY  Visits from Start of Care: 8  Current functional level related to goals / functional outcomes: See last progress note for discharge status    Remaining deficits: Continued deficits related to chronic conditions and decreased activity level.    Education / Equipment: HEP Plan: Patient agrees to discharge.  Patient goals were partially met. Patient is being discharged due to                                                     ?????    Patient will receive HHPT due to inability to attend PT during [place] restrictions.   Indalecio Malmstrom P. Helene Kelp PT, MPH 08/17/18 8:21 AM

## 2018-07-15 ENCOUNTER — Telehealth: Payer: Self-pay | Admitting: Rehabilitative and Restorative Service Providers"

## 2018-07-15 NOTE — Telephone Encounter (Signed)
Called patient to be sure she has appropriate HEP for the next two weeks while [facility] re[facility] clinic is closed due to COVID19 virus. Patient will call to schedule appointments when clinic opens 08/01/2018. She will call with any questions or problems.   Kathyleen Radice P. Leonor Liv PT, MPH 07/15/18 2:11 PM

## 2018-07-18 ENCOUNTER — Encounter: Payer: Medicare Other | Admitting: Rehabilitative and Restorative Service Providers"

## 2018-07-18 ENCOUNTER — Ambulatory Visit: Payer: Medicare Other | Admitting: Family Medicine

## 2018-07-19 NOTE — Telephone Encounter (Signed)
error 

## 2018-07-21 ENCOUNTER — Encounter: Payer: Medicare Other | Admitting: Rehabilitative and Restorative Service Providers"

## 2018-07-23 ENCOUNTER — Other Ambulatory Visit: Payer: Self-pay | Admitting: Family Medicine

## 2018-08-12 ENCOUNTER — Ambulatory Visit: Payer: Medicare Other | Admitting: Family Medicine

## 2018-08-12 ENCOUNTER — Encounter: Payer: Self-pay | Admitting: Sports Medicine

## 2018-08-12 ENCOUNTER — Ambulatory Visit (INDEPENDENT_AMBULATORY_CARE_PROVIDER_SITE_OTHER): Payer: Medicare Other | Admitting: Sports Medicine

## 2018-08-12 DIAGNOSIS — R296 Repeated falls: Secondary | ICD-10-CM | POA: Insufficient documentation

## 2018-08-12 DIAGNOSIS — I872 Venous insufficiency (chronic) (peripheral): Secondary | ICD-10-CM

## 2018-08-12 MED ORDER — AMBULATORY NON FORMULARY MEDICATION: 0 refills | Status: DC

## 2018-08-12 NOTE — Assessment & Plan Note (Signed)
Needs to increase her usage of her cane when ambulating, her fall occurred without the cane. Should also use her rolling walker as much as possible. Adding home health physical therapy to evaluate the home for safety and to do some balance training. [facility].

## 2018-08-12 NOTE — Assessment & Plan Note (Signed)
With chronic and severe lower extremity edema. Feels a bit weak and cannot get to the bathroom fast enough with furosemide. Not very compliant with the compression hose, she will wear these every day all day except when showering. Elevate feet in the evenings. Return in 1 to 2 weeks, if insufficient improvement in lower extremity edema we will start [place] boot treatment.

## 2018-08-12 NOTE — Progress Notes (Signed)
Subjective:    CC: Several issues  HPI: This is a pleasant 78 year old female, unfortunately she had another fall.  She was ambulating without her cane, and without her rolling walker.  She hit her left buttock, and has a significant bruise.  She is still able to ambulate.  She does admit that there may be some loose rugs around her house, and is agreeable to have home health physical therapy come by to work on balance and gait training as well as evaluate her home for safety.  Her husband has a great deal of difficulty lifting her up, he has injured his back several times doing so, at this point they would be agreeable to try a hoist.  In addition she has chronic lower extremity edema, bilateral, we have tried furosemide but this causes her to get up to void too often, and with her lack of mobility she cannot always make it to the bathroom.  She also feels very weak with furosemide.  She has compression hose, but does not wear these frequently.  She does try elevation of the legs.  I reviewed the past medical history, family history, social history, surgical history, and allergies today and no changes were needed.  Please see the problem list section below in epic for further details.  Past Medical History: Past Medical History:  Diagnosis Date  . Anxiety   . Arthritis   . Depression   . Headache(784.0)    migrains  . Heart murmur   . Hypothyroidism   . Pre-diabetes   . Rheumatoid arthritis(714.0)    Past Surgical History: Past Surgical History:  Procedure Laterality Date  . ABDOMINAL HYSTERECTOMY    . ANTERIOR CERVICAL DECOMP/DISCECTOMY FUSION N/A 01/03/2018   Procedure: ANTERIOR CERVICAL DECOMPRESSION/DISCECTOMY FUSION, ALLOGRAFT, PLATE;  Surgeon: Eldred Manges, MD;  Location: [facility] OR;  Service: Orthopedics;  Laterality: N/A;  . APPENDECTOMY    . CHOLECYSTECTOMY    . REPLACEMENT TOTAL KNEE BILATERAL    . TONSILLECTOMY     as  a child  . TOTAL HIP ARTHROPLASTY     right and left    Social History: Social History   Socioeconomic History  . Marital status: Married    Spouse name: Dorene Sorrow  . Number of children: 2  . Years of education: Not on file  . Highest education level: Not on file  Occupational History  . Occupation: Retired Runner, broadcasting/film/video    Comment: Corporate treasurer  Social Needs  . Financial resource strain: Not on file  . Food insecurity:    Worry: Not on file    Inability: Not on file  . Transportation needs:    Medical: Not on file    Non-medical: Not on file  Tobacco Use  . Smoking status: Never Smoker  . Smokeless tobacco: Never Used  Substance and Sexual Activity  . Alcohol use: No  . Drug use: Not on file  . Sexual activity: Not on file  Lifestyle  . Physical activity:    Days per week: Not on file    Minutes per session: Not on file  . Stress: Not on file  Relationships  . Social connections:    Talks on phone: Not on file    Gets together: Not on file    Attends religious service: Not on file    Active member of club or organization: Not on file    Attends meetings of clubs or organizations: Not on file    Relationship status: Not on file  Other Topics Concern  . Not on file  Social History Narrative   No regular exercise. 2 caffeine drinks per day.    Family History: Family History  Problem Relation Age of Onset  . Heart disease Father 59  . Diabetes Other        aunt   . Stroke Mother 67   Allergies: Allergies  Allergen Reactions  . Iodine Other (See Comments)    Shook violently and passed out.  . Codeine Other (See Comments)    Hallucinations  . Lipitor [Atorvastatin] Other (See Comments)    Myalgias   Medications: See med rec.  Review of Systems: No fevers, chills, night sweats, weight loss, chest pain, or shortness of breath.   Objective:    General: Well Developed, well nourished, and in no acute distress.  Neuro: Alert and oriented x3, extra-ocular muscles intact, sensation grossly intact.  HEENT:  Normocephalic, atraumatic, pupils equal round reactive to light, neck supple, no masses, no lymphadenopathy, thyroid nonpalpable.  Skin: Warm and dry, no rashes. Cardiac: Regular rate and rhythm, no murmurs rubs or gallops, no lower extremity edema.  Respiratory: Clear to auscultation bilaterally. Not using accessory muscles, speaking in full sentences. Left hip: Bruised, swollen, able to ambulate.  Good motion, she is overall weak, failed the get up and go test.  Impression and Recommendations:    Falls frequently Needs to increase her usage of her cane when ambulating, her fall occurred without the cane. Should also use her rolling walker as much as possible. Adding home health physical therapy to evaluate the home for safety and to do some balance training. [facility].   Chronic venous insufficiency With chronic and severe lower extremity edema. Feels a bit weak and cannot get to the bathroom fast enough with furosemide. Not very compliant with the compression hose, she will wear these every day all day except when showering. Elevate feet in the evenings. Return in 1 to 2 weeks, if insufficient improvement in lower extremity edema we will start Unna boot treatment.   ___________________________________________ Ihor Austin. Benjamin Stain, M.D., ABFM., CAQSM. Primary Care and Sports Medicine [facility]  Adjunct Professor of Family Medicine  [employer]

## 2018-08-16 ENCOUNTER — Telehealth: Payer: Self-pay | Admitting: Family Medicine

## 2018-08-16 ENCOUNTER — Other Ambulatory Visit: Payer: Self-pay | Admitting: Family Medicine

## 2018-08-16 NOTE — Telephone Encounter (Signed)
Received call from Collene Mares with [facility] home [facility] requesting verbal order for:  Home PT 2x week for 4 weeks, then 1x week for 4 weeks.   Verbal given. No further questions.

## 2018-08-16 NOTE — Telephone Encounter (Signed)
Agree with above.  Melissa Pulido, MD  

## 2018-08-22 ENCOUNTER — Encounter: Payer: Self-pay | Admitting: Family Medicine

## 2018-08-22 ENCOUNTER — Ambulatory Visit (INDEPENDENT_AMBULATORY_CARE_PROVIDER_SITE_OTHER): Payer: Medicare Other | Admitting: Family Medicine

## 2018-08-22 VITALS — BP 141/53 | HR 69 | Temp 97.7°F | Ht 62.0 in | Wt 209.0 lb

## 2018-08-22 DIAGNOSIS — T148XXA Other injury of unspecified body region, initial encounter: Secondary | ICD-10-CM

## 2018-08-22 DIAGNOSIS — R748 Abnormal levels of other serum enzymes: Secondary | ICD-10-CM

## 2018-08-22 DIAGNOSIS — E119 Type 2 diabetes mellitus without complications: Secondary | ICD-10-CM | POA: Diagnosis not present

## 2018-08-22 DIAGNOSIS — I8393 Asymptomatic varicose veins of bilateral lower extremities: Secondary | ICD-10-CM | POA: Insufficient documentation

## 2018-08-22 DIAGNOSIS — W57XXXA Bitten or stung by nonvenomous insect and other nonvenomous arthropods, initial encounter: Secondary | ICD-10-CM

## 2018-08-22 DIAGNOSIS — R5383 Other fatigue: Secondary | ICD-10-CM

## 2018-08-22 DIAGNOSIS — I872 Venous insufficiency (chronic) (peripheral): Secondary | ICD-10-CM

## 2018-08-22 DIAGNOSIS — F320 Major depressive disorder, single episode, mild: Secondary | ICD-10-CM | POA: Insufficient documentation

## 2018-08-22 DIAGNOSIS — J029 Acute pharyngitis, unspecified: Secondary | ICD-10-CM

## 2018-08-22 MED ORDER — AZITHROMYCIN 250 MG PO TABS: ORAL_TABLET | ORAL | 0 refills | Status: AC

## 2018-08-22 NOTE — Progress Notes (Signed)
Pt reports that she "feels bad all the time". Her husband said that if she doesn't have anything that she has to do she sleeps all day.  She said that she has had a sore throat that comes and goes and felt like she was getting a cold a few days ago she had a temp around 97. She also c/o experiencing more headaches than she ever has and this is new for her. She wanted to know if Dr. Linford Arnold would do COVID-19 testing on her.  She also had a tick bite they believe that they got all of the tick out.  After seeing Dr. Karie Schwalbe a couple of weeks ago she said that her feet feel bruised and her L hip still hurts.Marland KitchenMarland KitchenHeath Gold, CMA

## 2018-08-22 NOTE — Assessment & Plan Note (Signed)
She is already on Cymbalta 60 mg but I do think her depression is contributing to her symptoms.  I would like to get her in with Shanda Bumps our therapist for some virtual visits.  Patient agrees to do so and her husband is very supportive.  Go ahead and place referral today.

## 2018-08-22 NOTE — Progress Notes (Signed)
Established Patient Office Visit  Subjective:  Patient ID: Joann Maddox, female    DOB: Sep 23, 1940  Age: 78 y.o. MRN: [id number]  CC:  Chief Complaint  Patient presents with  . Follow-up    HPI Joann Maddox presents for a couple of different concerns. For about the last 2 weeks she has had a sore throat that comes and goes.  She feels like she has had cold symptoms.  No fever there or chills.  No significant cough which is been mostly mild.  No diarrhea or vomiting.  She is worried that she could have COVID.  She is just felt extremely tired and had more headaches than usual.  Just no energy at all.  In fact her husband says that she has been sleeping a lot during the daytime as well he is worried that she may actually be depressed.  She says was with tested for sleep apnea a couple of years ago and tested negative.  Her husband says she does not snore frequently.  She also let me know that her husband found a tick on her right upper arm about 2 days ago.  He tried to remove it and says he used alcohol and peroxide.  He just wanted me to check and make sure that we got it all.  She feels like her balance is actually getting worse.  In fact she was here about 2 weeks ago for balance problems.  She had fallen and had a big hematoma and contusion to her left buttock area she noticed afterwards that she also had some bruising on her left inner ankle and foot.   Past Medical History:  Diagnosis Date  . Anxiety   . Arthritis   . Depression   . Headache(784.0)    migrains  . Heart murmur   . Hypothyroidism   . Pre-diabetes   . Rheumatoid arthritis(714.0)     Past Surgical History:  Procedure Laterality Date  . ABDOMINAL HYSTERECTOMY    . ANTERIOR CERVICAL DECOMP/DISCECTOMY FUSION N/A 01/03/2018   Procedure: ANTERIOR CERVICAL DECOMPRESSION/DISCECTOMY FUSION, ALLOGRAFT, PLATE;  Surgeon: Eldred Manges, MD;  Location: [facility] OR;  Service: Orthopedics;  Laterality: N/A;  . APPENDECTOMY     . CHOLECYSTECTOMY    . REPLACEMENT TOTAL KNEE BILATERAL    . TONSILLECTOMY     as  a child  . TOTAL HIP ARTHROPLASTY     right and left    Family History  Problem Relation Age of Onset  . Heart disease Father 57  . Diabetes Other        aunt   . Stroke Mother 43    Social History   Socioeconomic History  . Marital status: Married    Spouse name: Dorene Sorrow  . Number of children: 2  . Years of education: Not on file  . Highest education level: Not on file  Occupational History  . Occupation: Retired Runner, broadcasting/film/video    Comment: Corporate treasurer  Social Needs  . Financial resource strain: Not on file  . Food insecurity:    Worry: Not on file    Inability: Not on file  . Transportation needs:    Medical: Not on file    Non-medical: Not on file  Tobacco Use  . Smoking status: Never Smoker  . Smokeless tobacco: Never Used  Substance and Sexual Activity  . Alcohol use: No  . Drug use: Not on file  . Sexual activity: Not on file  Lifestyle  . Physical  activity:    Days per week: Not on file    Minutes per session: Not on file  . Stress: Not on file  Relationships  . Social connections:    Talks on phone: Not on file    Gets together: Not on file    Attends religious service: Not on file    Active member of club or organization: Not on file    Attends meetings of clubs or organizations: Not on file    Relationship status: Not on file  . Intimate partner violence:    Fear of current or ex partner: Not on file    Emotionally abused: Not on file    Physically abused: Not on file    Forced sexual activity: Not on file  Other Topics Concern  . Not on file  Social History Narrative   No regular exercise. 2 caffeine drinks per day.     Outpatient Medications Prior to Visit  Medication Sig Dispense Refill  . azelaic acid (AZELEX) 20 % cream APPLY TOPICALL 2 TIMES A DAY (MORNING AND EVENING). APPLY AFTER SKIN IS WASHED AND PATTED DRY. 30 g 6  .  butalbital-acetaminophen-caffeine (FIORICET, ESGIC) 50-325-40 MG tablet Take 1 tablet by mouth 2 (two) times daily as needed. (Patient taking differently: Take 1 tablet by mouth 2 (two) times daily as needed for migraine. ) 14 tablet 5  . DULoxetine (CYMBALTA) 60 MG capsule Take 1 capsule (60 mg total) by mouth daily. 90 capsule 3  . folic acid (FOLVITE) 1 MG tablet TAKE 1 TABLET BY MOUTH EVERY DAY (Patient taking differently: Take 1 mg by mouth daily. ) 90 tablet 2  . furosemide (LASIX) 20 MG tablet TAKE 1 TABLET BY MOUTH EVERY DAY AS NEEDED (Patient taking differently: Take 20 mg by mouth daily as needed for fluid. ) 90 tablet 1  . gabapentin (NEURONTIN) 300 MG capsule One tab PO qHS for a week, then BID for a week, then TID. May double weekly to a max of 3,600mg /day 180 capsule 3  . hydroxychloroquine (PLAQUENIL) 200 MG tablet 2 TABLET WITH FOOD OR MILK ONCE A DAY ORALLY 180 tablet 1  . levothyroxine (SYNTHROID, LEVOTHROID) 25 MCG tablet Take 1 tablet (25 mcg total) by mouth daily. 90 tablet 3  . losartan (COZAAR) 50 MG tablet Take 1 tablet (50 mg total) by mouth daily. 90 tablet 1  . meloxicam (MOBIC) 15 MG tablet TAKE ONE TABLET BY MOUTH EACH AM WITH BREAKFAST AS NEEDED FOR PAIN 30 tablet 2  . metFORMIN (GLUCOPHAGE) 500 MG tablet TAKE 1 TABLET BY MOUTH 2 TIMES DAILY WITH A MEAL. (Patient taking differently: Take 500 mg by mouth daily. ) 180 tablet 1  . Multiple Vitamin (MULTIVITAMIN) tablet Take 1 tablet by mouth daily.    Marland Kitchen MYRBETRIQ 25 MG TB24 tablet TAKE 1 TABLET BY MOUTH EVERYDAY AT BEDTIME 90 tablet 1  . nystatin (MYCOSTATIN/NYSTOP) powder APPLY 1 GRAM TOPICALLY 2 (TWO) TIMES DAILY. X 3 WEEKS. 60 g 1  . Omega-3 Fatty Acids (FISH OIL) 1200 MG CAPS Take 1,200 mg by mouth 2 (two) times a week. WITH OMEGA-3 360 MG    . potassium chloride (K-DUR) 10 MEQ tablet TAKE 1 TABLET (10 MEQ TOTAL) BY MOUTH 2 (TWO) TIMES DAILY. 60 tablet 11  . senna (SENOKOT) 8.6 MG tablet Take 1 tablet by mouth at  bedtime.     . traMADol (ULTRAM) 50 MG tablet TAKE 1-2 TABS BY MOUTH EVERY 12 HOURS AS NEEDED FOR PAIN 120 tablet  1  . TURMERIC PO Take 1,000 mg by mouth daily.    . vitamin B-12 (CYANOCOBALAMIN) 100 MCG tablet Take 100 mcg by mouth at bedtime.     . Vitamin D, Ergocalciferol, (DRISDOL) 1.25 MG (50000 UT) CAPS capsule Take 1 capsule (50,000 Units total) by mouth every Sunday. 4 capsule 8  . rosuvastatin (CRESTOR) 10 MG tablet Take 1 tablet (10 mg total) by mouth at bedtime. (Patient not taking: Reported on 08/22/2018) 90 tablet 3  . AMBULATORY NON FORMULARY MEDICATION Walker. Use a needed for leg weakness.  Disp 1 R29.898 1 each 0  . AMBULATORY NON FORMULARY MEDICATION Hoist for use at home 1 each 0   No facility-administered medications prior to visit.     Allergies  Allergen Reactions  . Iodine Other (See Comments)    Shook violently and passed out.  . Codeine Other (See Comments)    Hallucinations  . Lipitor [Atorvastatin] Other (See Comments)    Myalgias    ROS Review of Systems    Objective:    Physical Exam  Constitutional: She is oriented to person, place, and time. She appears well-developed and well-nourished.  HENT:  Head: Normocephalic and atraumatic.  Right Ear: External ear normal.  Left Ear: External ear normal.  Nose: Nose normal.  Mouth/Throat: Oropharynx is clear and moist.  TMs and canals are clear.   Eyes: Pupils are equal, round, and reactive to light. Conjunctivae and EOM are normal.  Neck: Neck supple. No thyromegaly present.  Cardiovascular: Normal rate, regular rhythm and normal heart sounds.  Pulmonary/Chest: Effort normal and breath sounds normal. She has no wheezes.  Musculoskeletal:     Comments: She does have 2+ pitting edema in the left lower extremity and 1+ on the right.  She has some large varicosities that cause the most of blue discoloration of the skin on the left inner ankle.  She also has 1+ edema on the tops of her feet.   Lymphadenopathy:    She has no cervical adenopathy.  Neurological: She is alert and oriented to person, place, and time.  Skin: Skin is warm and dry.  She does have a couple of scabs where the tick was removed on her right upper arm.  No sign of significant erythema or infection or active drainage.  Psychiatric: She has a normal mood and affect.    BP (!) 141/53   Pulse 69   Temp 97.7 F (36.5 C)   Ht 5\' 2"  (1.575 m)   Wt 209 lb (94.8 kg)   SpO2 98%   BMI 38.23 kg/m  Wt Readings from Last 3 Encounters:  08/22/18 209 lb (94.8 kg)  08/12/18 211 lb (95.7 kg)  07/01/18 212 lb (96.2 kg)     Health Maintenance Due  Topic Date Due  . OPHTHALMOLOGY EXAM  06/25/1950    There are no preventive care reminders to display for this patient.  Lab Results  Component Value Date   TSH 0.60 07/01/2018   Lab Results  Component Value Date   WBC 5.0 07/01/2018   HGB 10.7 (L) 07/01/2018   HCT 32.3 (L) 07/01/2018   MCV 89.2 07/01/2018   PLT 267 07/01/2018   Lab Results  Component Value Date   NA 142 05/30/2018   K 4.1 05/30/2018   CO2 27 05/30/2018   GLUCOSE 94 05/30/2018   BUN 12 05/30/2018   CREATININE 0.64 05/30/2018   BILITOT 0.6 05/30/2018   BILITOT 0.6 05/30/2018   ALKPHOS 66 12/23/2017  AST 19 05/30/2018   ALT 16 05/30/2018   PROT 6.3 05/30/2018   ALBUMIN 3.7 12/23/2017   CALCIUM 9.6 05/30/2018   ANIONGAP 10 01/04/2018   Lab Results  Component Value Date   CHOL 128 12/03/2017   Lab Results  Component Value Date   HDL 77 12/03/2017   Lab Results  Component Value Date   LDLCALC 35 12/03/2017   Lab Results  Component Value Date   TRIG 75 12/03/2017   Lab Results  Component Value Date   CHOLHDL 1.7 12/03/2017   Lab Results  Component Value Date   HGBA1C 5.2 07/01/2018      Assessment & Plan:   Problem List Items Addressed This Visit      Cardiovascular and Mediastinum   Varicose veins of both lower extremities    Gave her reassurance.  What  she thought was bruising on that left and her ankle is actually just varicose veins.  I think she has been swelling a little bit more than usual which she reports since putting extra pressure on the venous system which is causing them to bulge more and so the skin does have a bluish discoloration but it is actually the varicose veins.      Chronic venous insufficiency   Relevant Orders   CBC with Differential/Platelet   COMPLETE METABOLIC PANEL WITH GFR   TSH   VITAMIN D 25 Hydroxy (Vit-D Deficiency, Fractures)   CK (Creatine Kinase)     Endocrine   Type 2 diabetes mellitus without complication (HCC)   Relevant Orders   CBC with Differential/Platelet   COMPLETE METABOLIC PANEL WITH GFR   TSH   VITAMIN D 25 Hydroxy (Vit-D Deficiency, Fractures)   CK (Creatine Kinase)     Other   Fatigue - Primary    I still have a great cause for her fatigue except for just continued deconditioning secondary to severe osteoarthritis and obesity.  She continues to get more weak and has been sleeping a lot which only contributes to the deconditioning.  We will do a CBC today as well as recheck some additional labs.      Relevant Orders   CBC with Differential/Platelet   COMPLETE METABOLIC PANEL WITH GFR   TSH   VITAMIN D 25 Hydroxy (Vit-D Deficiency, Fractures)   CK (Creatine Kinase)   Ambulatory referral to Behavioral Health   Depression, major, single episode, mild (HCC)    She is already on Cymbalta 60 mg but I do think her depression is contributing to her symptoms.  I would like to get her in with Shanda Bumps our therapist for some virtual visits.  Patient agrees to do so and her husband is very supportive.  Go ahead and place referral today.      Relevant Orders   Ambulatory referral to Behavioral Health    Other Visit Diagnoses    Elevated CK       Relevant Orders   CBC with Differential/Platelet   COMPLETE METABOLIC PANEL WITH GFR   TSH   VITAMIN D 25 Hydroxy (Vit-D Deficiency,  Fractures)   CK (Creatine Kinase)   Tick bite, initial encounter       Hematoma       Pharyngitis, unspecified etiology          Pharyngitis with postnasal drip-she said symptoms persistently for about 2 weeks and does not feel like she is getting any better.  We will go ahead and treat with azithromycin.  Tick bite-I did examine the  skin it looks clear I do think he was able to remove most of the tick.  Just keep an eye on the wound for any worsening symptoms or drainage.  GAit instability-she has had 1 session so far with the home physical therapist encouraged her to really work with them and give it her all.  Asked that even if she improves by 20 or 30% but not still improvement in the right direction and it means she is less likely to fall in the future.  Hematoma of the left buttock-seems to be improving.  Meds ordered this encounter  Medications  . azithromycin (ZITHROMAX) 250 MG tablet    Sig: 2 Ttabs PO on Day 1, then one a day x 4 days.    Dispense:  6 tablet    Refill:  0    Follow-up: Return in about 4 weeks (around 09/19/2018) for recheck .   Time spent 40 minutes, greater than 50% time spent counseling about depression, hematoma, varicose veins, lower extremity weakness, fatigue, sore throat, intake by  Nani Gasser, MD

## 2018-08-22 NOTE — Assessment & Plan Note (Signed)
I still have a great cause for her fatigue except for just continued deconditioning secondary to severe osteoarthritis and obesity.  She continues to get more weak and has been sleeping a lot which only contributes to the deconditioning.  We will do a CBC today as well as recheck some additional labs.

## 2018-08-22 NOTE — Assessment & Plan Note (Signed)
Gave her reassurance.  What she thought was bruising on that left and her ankle is actually just varicose veins.  I think she has been swelling a little bit more than usual which she reports since putting extra pressure on the venous system which is causing them to bulge more and so the skin does have a bluish discoloration but it is actually the varicose veins.

## 2018-08-23 ENCOUNTER — Other Ambulatory Visit: Payer: Self-pay | Admitting: Sports Medicine

## 2018-08-23 LAB — CBC WITH DIFFERENTIAL/PLATELET
Absolute Monocytes: 624 cells/uL (ref 200–950)
Basophils Absolute: 38 cells/uL (ref 0–200)
Basophils Relative: 0.6 %
Eosinophils Absolute: 189 cells/uL (ref 15–500)
Eosinophils Relative: 3 %
HCT: 33.3 % — ABNORMAL LOW (ref 35.0–45.0)
Hemoglobin: 11 g/dL — ABNORMAL LOW (ref 11.7–15.5)
Lymphs Abs: 1462 cells/uL (ref 850–3900)
MCH: 28.6 pg (ref 27.0–33.0)
MCHC: 33 g/dL (ref 32.0–36.0)
MCV: 86.7 fL (ref 80.0–100.0)
MPV: 9.7 fL (ref 7.5–12.5)
Monocytes Relative: 9.9 %
Neutro Abs: 3988 cells/uL (ref 1500–7800)
Neutrophils Relative %: 63.3 %
Platelets: 264 10*3/uL (ref 140–400)
RBC: 3.84 10*6/uL (ref 3.80–5.10)
RDW: 13.1 % (ref 11.0–15.0)
Total Lymphocyte: 23.2 %
WBC: 6.3 10*3/uL (ref 3.8–10.8)

## 2018-08-23 LAB — COMPLETE METABOLIC PANEL WITH GFR
AG Ratio: 1.6 (calc) (ref 1.0–2.5)
ALT: 15 U/L (ref 6–29)
AST: 22 U/L (ref 10–35)
Albumin: 3.9 g/dL (ref 3.6–5.1)
Alkaline phosphatase (APISO): 89 U/L (ref 37–153)
BUN: 21 mg/dL (ref 7–25)
CO2: 30 mmol/L (ref 20–32)
Calcium: 9.7 mg/dL (ref 8.6–10.4)
Chloride: 107 mmol/L (ref 98–110)
Creat: 0.67 mg/dL (ref 0.60–0.93)
GFR, Est African American: 99 mL/min/{1.73_m2} (ref >=60)
GFR, Est Non African American: 85 mL/min/{1.73_m2} (ref >=60)
Globulin: 2.4 g/dL (calc) (ref 1.9–3.7)
Glucose, Bld: 105 mg/dL — ABNORMAL HIGH (ref 65–99)
Potassium: 4.3 mmol/L (ref 3.5–5.3)
Sodium: 142 mmol/L (ref 135–146)
Total Bilirubin: 0.5 mg/dL (ref 0.2–1.2)
Total Protein: 6.3 g/dL (ref 6.1–8.1)

## 2018-08-23 LAB — CK: Total CK: 173 U/L — ABNORMAL HIGH (ref 29–143)

## 2018-08-23 LAB — TSH: TSH: 0.48 mIU/L (ref 0.40–4.50)

## 2018-08-23 LAB — VITAMIN D 25 HYDROXY (VIT D DEFICIENCY, FRACTURES): Vit D, 25-Hydroxy: 61 ng/mL (ref 30–100)

## 2018-08-24 ENCOUNTER — Ambulatory Visit: Payer: Medicare Other | Admitting: Sports Medicine

## 2018-09-01 ENCOUNTER — Ambulatory Visit: Payer: Medicare Other | Admitting: Family Medicine

## 2018-09-02 DIAGNOSIS — Z6828 Body mass index (BMI) 28.0-28.9, adult: Secondary | ICD-10-CM | POA: Insufficient documentation

## 2018-09-02 DIAGNOSIS — E669 Obesity, unspecified: Secondary | ICD-10-CM | POA: Insufficient documentation

## 2018-09-11 ENCOUNTER — Other Ambulatory Visit: Payer: Self-pay | Admitting: Family Medicine

## 2018-09-14 ENCOUNTER — Telehealth: Payer: Self-pay | Admitting: Family Medicine

## 2018-09-14 NOTE — Telephone Encounter (Signed)
Received call from Collene Mares with [facility] home health requesting verbal order for:  Home OT eval and treat for limited ROM in Lt shoulder.   Verbal given. No further questions.

## 2018-09-14 NOTE — Telephone Encounter (Signed)
Agree with below. Addendum:    I believe that she has limits with moving and including, toileting, bathing, feeding, dressing and grooming. I believe the power wheelchair is needed for pt to be able to perform ADL's in her home

## 2018-09-20 ENCOUNTER — Ambulatory Visit: Payer: Medicare Other | Admitting: Family Medicine

## 2018-09-20 ENCOUNTER — Encounter: Payer: Self-pay | Admitting: Family Medicine

## 2018-09-20 ENCOUNTER — Ambulatory Visit (INDEPENDENT_AMBULATORY_CARE_PROVIDER_SITE_OTHER): Payer: Medicare Other | Admitting: Family Medicine

## 2018-09-20 VITALS — BP 140/60 | Temp 97.6°F | Ht 62.0 in

## 2018-09-20 DIAGNOSIS — R600 Localized edema: Secondary | ICD-10-CM

## 2018-09-20 DIAGNOSIS — R29898 Other symptoms and signs involving the musculoskeletal system: Secondary | ICD-10-CM

## 2018-09-20 DIAGNOSIS — F320 Major depressive disorder, single episode, mild: Secondary | ICD-10-CM | POA: Diagnosis not present

## 2018-09-20 DIAGNOSIS — M542 Cervicalgia: Secondary | ICD-10-CM

## 2018-09-20 DIAGNOSIS — I872 Venous insufficiency (chronic) (peripheral): Secondary | ICD-10-CM | POA: Insufficient documentation

## 2018-09-20 DIAGNOSIS — W19XXXA Unspecified fall, initial encounter: Secondary | ICD-10-CM

## 2018-09-20 MED ORDER — AMBULATORY NON FORMULARY MEDICATION: 0 refills | Status: DC

## 2018-09-20 NOTE — Progress Notes (Signed)
Legs and feet are still swollen and her neck and shoulder (L) still bothers her. Pt thought that Dr. Linford Arnold was going to get a Lift for her she hasn't heard about this.  Laureen Ochs, Viann Shove, CMA

## 2018-09-20 NOTE — Progress Notes (Signed)
Virtual Visit via Video Note  I connected with Joann Maddox on 09/20/18 at  3:40 PM EDT by a video enabled telemedicine application and verified that I am speaking with the correct person using two identifiers.   I discussed the limitations of evaluation and management by telemedicine and the availability of in person appointments. The patient expressed understanding and agreed to proceed.  Pt was at home and I was in my office for the virtual visit.     Subjective:    CC: Fatigue  HPI:  F/U fatigue -he still feels tired and fatigued.  She also still is worried about falling again but has been using her cane consistently.  Her legs and feet are still swollen-she actually saw Dr. Benjamin Stain about 5 to 6 weeks ago for significant lower extremity edema.  She had not been wearing her compression stockings.  She has been trying to take her Lasix consistently but unfortunately it causes her to run to the bathroom 4 hours after she takes it.  But says that it does not really seem to help her swelling all that much. Left leg is worse than the right.    Lower extremity weakness-she had been referred to PT back in March right before COVID hit for physical therapy but was only able to go to about 2 appointments before they closed their office hours down.  She had actually fallen recently while not using her cane.  Dr. Benjamin Stain had seen her in April and recommended that she get home health physical therapy to evaluate for home safety and do some balance training.  Chronic pain - using Tramadol for pain.  He says it does seem to be helpful and feels like it is adequate to control her pain.Her pain is worse in  in her arms and neck and left shoulder.  Usually takes 2 tramadol BID but occ uses TID.  She says she has a good pillow.   Lower extremity weakness- has been getting PT and OT via Home health.    Past medical history, Surgical history, Family history not pertinant except as noted below,  Social history, Allergies, and medications have been entered into the medical record, reviewed, and corrections made.   Review of Systems: No fevers, chills, night sweats, weight loss, chest pain, or shortness of breath.   Objective:    General: Speaking clearly in complete sentences without any shortness of breath.  Alert and oriented x3.  Normal judgment. No apparent acute distress. Well groomed.     Impression and Recommendations:   Fatigue -stable.  It is not worse than it was previously.  Major Depressive D.O. she actually seemed in good spirits today so we will just continue to monitor for now but may need to circle back around to considering medication.  I did refer her to work with the therapist/counselor.  Lower extremity edema -  Consider lymphedema. Will have her try increasing the lasix 40mg  for 2 days to see if this is actually effective.  She says when she takes the 20 mg she says she feels like it works because she runs to the bathroom for several hours in fact is even hard to make it to the bathroom on time, but does not feel like her leg swelling actually improves.  Consider lymphedema on the differential diagnosis.  If increasing the diuretic does not help then we could consider compression stockings and/or referral to lymphedema clinic for further evaluation.  Arthritis, more pain in her neck and shoulders  right now but feels like the pain in her lower extremities is actually been better since she is doing home physical therapy.  She feels like the tramadol is effective and does not want to make any changes there.  Lower extremity weakness-continue to work with physical therapy which I think is actually been really helpful for her.  Dr. Benjamin Stain did actually order hoist so I am not sure why she has not been contacted about that order so I just went ahead and placed today new order today we will try to get the DME 3 by [facility].  We will call to see if they supply their own DME  for the use a another third-party for this.    I discussed the assessment and treatment plan with the patient. The patient was provided an opportunity to ask questions and all were answered. The patient agreed with the plan and demonstrated an understanding of the instructions.   The patient was advised to call back or seek an in-person evaluation if the symptoms worsen or if the condition fails to improve as anticipated.   Nani Gasser, MD

## 2018-09-22 ENCOUNTER — Other Ambulatory Visit: Payer: Self-pay

## 2018-09-22 ENCOUNTER — Ambulatory Visit (INDEPENDENT_AMBULATORY_CARE_PROVIDER_SITE_OTHER): Payer: Medicare Other

## 2018-09-22 ENCOUNTER — Ambulatory Visit: Payer: Medicare Other | Admitting: Psychology

## 2018-09-22 ENCOUNTER — Ambulatory Visit (INDEPENDENT_AMBULATORY_CARE_PROVIDER_SITE_OTHER): Payer: Medicare Other | Admitting: Family Medicine

## 2018-09-22 ENCOUNTER — Encounter: Payer: Self-pay | Admitting: Family Medicine

## 2018-09-22 ENCOUNTER — Telehealth: Payer: Self-pay

## 2018-09-22 VITALS — BP 144/71 | HR 91 | Temp 98.2°F | Wt 209.0 lb

## 2018-09-22 DIAGNOSIS — M25521 Pain in right elbow: Secondary | ICD-10-CM

## 2018-09-22 DIAGNOSIS — S42201A Unspecified fracture of upper end of right humerus, initial encounter for closed fracture: Secondary | ICD-10-CM | POA: Insufficient documentation

## 2018-09-22 DIAGNOSIS — M25511 Pain in right shoulder: Secondary | ICD-10-CM | POA: Diagnosis not present

## 2018-09-22 DIAGNOSIS — S42291A Other displaced fracture of upper end of right humerus, initial encounter for closed fracture: Secondary | ICD-10-CM | POA: Diagnosis not present

## 2018-09-22 MED ORDER — OXYCODONE-ACETAMINOPHEN 5-325 MG PO TABS: 1.0000 | ORAL_TABLET | Freq: Three times a day (TID) | ORAL | 0 refills | Status: DC | PRN

## 2018-09-22 NOTE — Telephone Encounter (Signed)
Ratazio, OT with [facility], called to let Dr Linford Arnold know that he called pt to set up for first visit today but she declined since she came in today and was seen by Dr Denyse Amass for a fall/arm fracture.   Ratazio states he will try to set up pt again next week.   No needs- FYI to PCP

## 2018-09-22 NOTE — Progress Notes (Signed)
Joann Maddox is a 78 y.o. female who presents to [facility] today for right arm injury.  Patient fell yesterday landing on her right shoulder and right upper arm.  She notes pain in the shoulder and elbow.  She is tried taking tramadol which is not sufficient to control her pain.  She had multiple falls recently.  This fall was a simple trip.  She does have a walker that she does not use very much.  She denies any weakness or numbness distally to her hand.   ROS:  As above  Exam:  BP (!) 144/71   Pulse 91   Temp 98.2 F (36.8 C) (Oral)   Wt 209 lb (94.8 kg)   BMI 38.23 kg/m  Wt Readings from Last 5 Encounters:  09/22/18 209 lb (94.8 kg)  08/22/18 209 lb (94.8 kg)  08/12/18 211 lb (95.7 kg)  07/01/18 212 lb (96.2 kg)  06/20/18 206 lb (93.4 kg)   General: Well Developed, well nourished, and in no acute distress.  Neuro/Psych: Alert and oriented x3, extra-ocular muscles intact, able to move all 4 extremities, sensation grossly intact. Skin: Warm and dry, no rashes noted.  Respiratory: Not using accessory muscles, speaking in full sentences, trachea midline.  Cardiovascular: Pulses palpable, no extremity edema. Abdomen: Does not appear distended. MSK: Bruising and swelling right upper arm.  Tender to palpation proximal humerus and shoulder.  Shoulder range of motion not tested. Elbow again bruising and swelling.  Tender palpation along lateral elbow.  Elbow motion not tested. Pulses cap refill and sensation are intact distally.  Strength is intact in the hand.    Lab and Radiology Results X-ray images right shoulder and elbow personally independently reviewed X-ray shoulder reveals slightly displaced fracture at humeral head with slight angulation.  Fracture does not appear to be comminuted to plain imaging.   X-ray right elbow significant degenerative changes no obvious fracture.  Await formal radiology over read       Assessment and Plan: 78 y.o. female with fall with fracture of right proximal humerus.  Fracture appears to be aligned enough that conservative management should be sufficient.  Will treat with cuff and collar sling and recheck in 1 week.  Elbow is likely contusion.  Hopefully this will improve shortly.  For pain control will use limited oxycodone.  Discussed fall prevention.  Encourage use of walker if able.  Recheck in 1 week.   PDMP reviewed during this encounter. Orders Placed This Encounter  Procedures  . DG Elbow Complete Right    Standing Status:   Future    Number of Occurrences:   1    Standing Expiration Date:   11/22/2019    Order Specific Question:   Reason for Exam (SYMPTOM  OR DIAGNOSIS REQUIRED)    Answer:   eval right elbow pain after fall    Order Specific Question:   Preferred imaging location?    Answer:   [facility]    Order Specific Question:   Radiology Contrast Protocol - do NOT remove file path    Answer:   \\[email or web address]  . DG Shoulder Right    Standing Status:   Future    Number of Occurrences:   1    Standing Expiration Date:   11/22/2019    Order Specific Question:   Reason for Exam (SYMPTOM  OR DIAGNOSIS REQUIRED)    Answer:   eval shoulder pain after fall    Order Specific Question:  Preferred imaging location?    Answer:   [facility]    Order Specific Question:   Radiology Contrast Protocol - do NOT remove file path    Answer:   \\[email or web address]   Meds ordered this encounter  Medications  . DISCONTD: oxyCODONE-acetaminophen (PERCOCET/ROXICET) 5-325 MG tablet    Sig: Take 1 tablet by mouth every 8 (eight) hours as needed.    Dispense:  15 tablet    Refill:  0  . oxyCODONE-acetaminophen (PERCOCET/ROXICET) 5-325 MG tablet    Sig: Take 1 tablet by mouth every 8 (eight) hours as needed.    Dispense:  15 tablet    Refill:  0    Historical  information moved to improve visibility of documentation.  Past Medical History:  Diagnosis Date  . Anxiety   . Arthritis   . Depression   . Headache(784.0)    migrains  . Heart murmur   . Hypothyroidism   . Pre-diabetes   . Rheumatoid arthritis(714.0)    Past Surgical History:  Procedure Laterality Date  . ABDOMINAL HYSTERECTOMY    . ANTERIOR CERVICAL DECOMP/DISCECTOMY FUSION N/A 01/03/2018   Procedure: ANTERIOR CERVICAL DECOMPRESSION/DISCECTOMY FUSION, ALLOGRAFT, PLATE;  Surgeon: Eldred Manges, MD;  Location: [facility] OR;  Service: Orthopedics;  Laterality: N/A;  . APPENDECTOMY    . CHOLECYSTECTOMY    . REPLACEMENT TOTAL KNEE BILATERAL    . TONSILLECTOMY     as  a child  . TOTAL HIP ARTHROPLASTY     right and left   Social History   Tobacco Use  . Smoking status: Never Smoker  . Smokeless tobacco: Never Used  Substance Use Topics  . Alcohol use: No   family history includes Diabetes in an other family member; Heart disease (age of onset: 73) in her father; Stroke (age of onset: 30) in her mother.  Medications: Current Outpatient Medications  Medication Sig Dispense Refill  . AMBULATORY NON FORMULARY MEDICATION Medication Name:hydraulic patient lift. Please fax to [facility] 1 each 0  . azelaic acid (AZELEX) 20 % cream APPLY TOPICALL 2 TIMES A DAY (MORNING AND EVENING). APPLY AFTER SKIN IS WASHED AND PATTED DRY. 30 g 6  . butalbital-acetaminophen-caffeine (FIORICET, ESGIC) 50-325-40 MG tablet Take 1 tablet by mouth 2 (two) times daily as needed. (Patient taking differently: Take 1 tablet by mouth 2 (two) times daily as needed for migraine. ) 14 tablet 5  . DULoxetine (CYMBALTA) 60 MG capsule Take 1 capsule (60 mg total) by mouth daily. 90 capsule 3  . folic acid (FOLVITE) 1 MG tablet TAKE 1 TABLET BY MOUTH EVERY DAY (Patient taking differently: Take 1 mg by mouth daily. ) 90 tablet 2  . furosemide (LASIX) 20 MG tablet TAKE 1 TABLET BY MOUTH EVERY DAY AS NEEDED (Patient taking  differently: Take 20 mg by mouth daily as needed for fluid. ) 90 tablet 1  . gabapentin (NEURONTIN) 300 MG capsule One tab PO qHS for a week, then BID for a week, then TID. May double weekly to a max of 3,600mg /day 180 capsule 3  . hydroxychloroquine (PLAQUENIL) 200 MG tablet 2 TABLET WITH FOOD OR MILK ONCE A DAY ORALLY 180 tablet 1  . levothyroxine (SYNTHROID, LEVOTHROID) 25 MCG tablet Take 1 tablet (25 mcg total) by mouth daily. 90 tablet 3  . losartan (COZAAR) 50 MG tablet Take 1 tablet (50 mg total) by mouth daily. 90 tablet 1  . meloxicam (MOBIC) 15 MG tablet TAKE ONE TABLET BY MOUTH EACH AM WITH BREAKFAST AS NEEDED  FOR PAIN 30 tablet 2  . metFORMIN (GLUCOPHAGE) 500 MG tablet TAKE 1 TABLET BY MOUTH 2 TIMES DAILY WITH A MEAL. 180 tablet 2  . Multiple Vitamin (MULTIVITAMIN) tablet Take 1 tablet by mouth daily.    Marland Kitchen. MYRBETRIQ 25 MG TB24 tablet TAKE 1 TABLET BY MOUTH EVERYDAY AT BEDTIME 90 tablet 1  . nystatin (MYCOSTATIN/NYSTOP) powder APPLY 1 GRAM TOPICALLY 2 (TWO) TIMES DAILY. X 3 WEEKS. 60 g 1  . Omega-3 Fatty Acids (FISH OIL) 1200 MG CAPS Take 1,200 mg by mouth 2 (two) times a week. WITH OMEGA-3 360 MG    . potassium chloride (K-DUR) 10 MEQ tablet TAKE 1 TABLET (10 MEQ TOTAL) BY MOUTH 2 (TWO) TIMES DAILY. 60 tablet 11  . rosuvastatin (CRESTOR) 10 MG tablet Take 1 tablet (10 mg total) by mouth at bedtime. 90 tablet 3  . senna (SENOKOT) 8.6 MG tablet Take 1 tablet by mouth at bedtime.     . traMADol (ULTRAM) 50 MG tablet TAKE 1-2 TABS BY MOUTH EVERY 12 HOURS AS NEEDED FOR PAIN 120 tablet 1  . TURMERIC PO Take 1,000 mg by mouth daily.    . vitamin B-12 (CYANOCOBALAMIN) 100 MCG tablet Take 100 mcg by mouth at bedtime.     . Vitamin D, Ergocalciferol, (DRISDOL) 1.25 MG (50000 UT) CAPS capsule Take 1 capsule (50,000 Units total) by mouth every Sunday. 4 capsule 8  . oxyCODONE-acetaminophen (PERCOCET/ROXICET) 5-325 MG tablet Take 1 tablet by mouth every 8 (eight) hours as needed. 15 tablet 0   No  current facility-administered medications for this visit.    Allergies  Allergen Reactions  . Iodine Other (See Comments)    Shook violently and passed out.  . Codeine Other (See Comments)    Hallucinations  . Lipitor [Atorvastatin] Other (See Comments)    Myalgias      Discussed warning signs or symptoms. Please see discharge instructions. Patient expresses understanding.

## 2018-09-22 NOTE — Patient Instructions (Addendum)
Thank you for coming in today. Recheck in 1 week.  Return sooner if needed.  Use the cuff and collar  Ok to use a sling if better.    Humerus Fracture Treated With Immobilization  A humerus fracture is a break in the large bone in the upper arm (humerus). If the joint is stable and the bones are still in their normal position (nondisplaced), the injury may be treated with immobilization. This involves the use of a cast, splint, or sling to hold your arm in place. Immobilization ensures that your bones continue to stay in the correct position while your arm is healing. What are the causes? This condition may be caused by:  A fall.  A hard, direct hit to the arm.  A motor vehicle accident. What increases the risk? This condition is more likely to develop in:  Elderly people.  People who have a disease that makes the bones thin and weak (osteoporosis). What are the signs or symptoms? Symptoms of this condition include:  Pain.  Swelling.  Bruising.  Not being able to move the arm normally. How is this diagnosed? This condition may be diagnosed based on a physical exam and X-rays. X-rays of your upper arm, elbow, and shoulder may be done. You may also have a CT scan. How is this treated? Treatment for this condition involves wearing a cast, splint, or sling until the injured area is stable enough for you to begin range-of-motion exercises. You may also be prescribed pain medicine. Follow these instructions at home: If you have a cast:  Do not stick anything inside the cast to scratch your skin. Doing that increases your risk of infection.  Check the skin around the cast every day. Report any concerns to your health care provider. You may put lotion on dry skin around the edges of the cast. Do not apply lotion to the skin underneath the cast.  Keep the cast clean and dry as told by your health care provider. If you have a splint:  Wear it as told by your health care  provider. Remove it only as told by your health care provider.  Loosen the splint if your fingers become numb and tingle, or if they turn cold and blue.  Keep the splint clean and dry. If you have a sling:  Wear it as told by your health care provider. Remove it only as told by your health care provider. Bathing  Do not take baths, swim, or use a hot tub until your health care provider approves. Ask your health care provider if you can take showers. You may only be allowed to take sponge baths for bathing.  If your cast or splint is not waterproof, cover it with a watertight plastic bag while you take a bath or a shower. Do not let the cast or splint get wet.  If you have a sling, remove it for bathing only if your health care provider tells you that it is safe to do that. Managing pain, stiffness, and swelling   If directed, apply ice to the injured area. ? Put ice in a plastic bag. ? Place a towel between your skin and the bag. ? Leave the ice on for 20 minutes, 2-3 times per day.  Move your fingers often to avoid stiffness and to lessen swelling.  Raise (elevate) the injured area above the level of your heart while you are sitting or lying down. Driving  Do not drive or operate heavy machinery while taking  prescription pain medicine.  Do not drive while wearing a cast, splint, or sling on an arm that you use for driving. Activity  Return to your normal activities as told by your health care provider. Ask your health care provider what activities are safe for you.  Perform range-of-motion exercises only as told by your health care provider or physical therapist. General instructions  Do not put pressure on any part of the cast or splint until it is fully hardened. This may take several hours.  Do not use any tobacco products, including cigarettes, chewing tobacco, or e-cigarettes. Tobacco can delay bone healing. If you need help quitting, ask your health care  provider.  Take over-the-counter and prescription medicines only as told by your health care provider.  Keep all follow-up visits as told by your health care provider. This is important. Contact a health care provider if:  You have any new pain, swelling, or bruising.  Your pain, swelling, and bruising do not improve.  Your cast, splint, or sling becomes loose or damaged. Get help right away if:  Your skin or fingers on your injured arm turn blue or gray.  Your arm feels cold or numb.  You have severe pain in your injured arm. This information is not intended to replace advice given to you by your health care provider. Make sure you discuss any questions you have with your health care provider. Document Released: 07/20/2000 Document Revised: 03/11/2017 Document Reviewed: 09/05/2014 Elsevier Interactive Patient Education  2019 [employer].

## 2018-09-23 ENCOUNTER — Telehealth: Payer: Self-pay | Admitting: Family Medicine

## 2018-09-23 NOTE — Telephone Encounter (Signed)
Pt advised. Will call back Monday with update

## 2018-09-23 NOTE — Telephone Encounter (Signed)
Called patient back, she wanted to let Dr Denyse Amass know that she is in a lot of pain today. Patient reports pain in both right and left arm. States she does not think she needs to be seen, she just wanted to  Make Dr Denyse Amass aware. She is taking the pain medication she was given yesterday and is relaxing and trying to take it easy. Advised patient if she has any needs to contact the office. Note to Dr Denyse Amass

## 2018-09-23 NOTE — Telephone Encounter (Signed)
Joann Maddox: Pt lvm asking that you to call her back.  Thanks.

## 2018-09-23 NOTE — Telephone Encounter (Signed)
Can increase in pain medication if needed.  Will reassess on Monday.

## 2018-09-23 NOTE — Telephone Encounter (Signed)
Thank you :)

## 2018-09-26 ENCOUNTER — Ambulatory Visit (INDEPENDENT_AMBULATORY_CARE_PROVIDER_SITE_OTHER): Payer: Medicare Other

## 2018-09-26 ENCOUNTER — Other Ambulatory Visit: Payer: Self-pay

## 2018-09-26 ENCOUNTER — Encounter: Payer: Self-pay | Admitting: Family Medicine

## 2018-09-26 ENCOUNTER — Ambulatory Visit (INDEPENDENT_AMBULATORY_CARE_PROVIDER_SITE_OTHER): Payer: Medicare Other | Admitting: Family Medicine

## 2018-09-26 VITALS — BP 151/78 | HR 87 | Temp 97.6°F | Wt 209.0 lb

## 2018-09-26 DIAGNOSIS — M25512 Pain in left shoulder: Secondary | ICD-10-CM

## 2018-09-26 DIAGNOSIS — W19XXXD Unspecified fall, subsequent encounter: Secondary | ICD-10-CM

## 2018-09-26 DIAGNOSIS — M25532 Pain in left wrist: Secondary | ICD-10-CM

## 2018-09-26 DIAGNOSIS — S42291D Other displaced fracture of upper end of right humerus, subsequent encounter for fracture with routine healing: Secondary | ICD-10-CM

## 2018-09-26 DIAGNOSIS — M19012 Primary osteoarthritis, left shoulder: Secondary | ICD-10-CM

## 2018-09-26 MED ORDER — OXYCODONE-ACETAMINOPHEN 10-325 MG PO TABS: 1.0000 | ORAL_TABLET | Freq: Three times a day (TID) | ORAL | 0 refills | Status: DC | PRN

## 2018-09-26 NOTE — Patient Instructions (Addendum)
Thank you for coming in today. STOP tramadol.  Start oxycodone 10mg  pills 1 every 8 hours as needed for pain.  Cancel Thursday's visit.  Recheck in 2 weeks or sooner if needed.  If you have a problem with the medicine let me know.  I will not be suppressed if we need to adjust the dose.  I will send you the xray report where it is available.

## 2018-09-26 NOTE — Progress Notes (Signed)
Joann Maddox is a 78 y.o. female who [name] today for shoulder and wrist pain.  Patient was seen on May 28 for right shoulder pain and right elbow pain.  Pain occurred after a fall and patient was found to have impacted proximal humerus/humeral head fracture.  She was treated with cuff and collar.  Her existing tramadol prescription was continued at 4 total pills per day of 50 mg size pills.  Oxycodone was added.  She notes she continues to have severe pain in her right shoulder.  However she notes that her left shoulder started hurting as well.  She has a history of significant end-stage DJD and was in the process of getting scheduled for shoulder shoulder replacement prior to her right shoulder injury.  She notes with increased activity her left shoulder has started hurting quite a bit as well.  She also notes in the fall that she could have injured it and is worried that she may have hurt her left shoulder.  She also notes some left wrist pain and swelling associated after the fall as well.  She denies severe dysfunction and notes that her symptoms are improving a bit.   ROS as above:  Exam:  BP (!) 151/78   Pulse 87   Temp 97.6 F (36.4 C) (Oral)   Wt 209 lb (94.8 kg)   BMI 38.23 kg/m  Wt Readings from Last 5 Encounters:  09/26/18 209 lb (94.8 kg)  09/22/18 209 lb (94.8 kg)  08/22/18 209 lb (94.8 kg)  08/12/18 211 lb (95.7 kg)  07/01/18 212 lb (96.2 kg)    Gen: Well NAD HEENT: EOMI,  MMM Lungs: Normal work of breathing. CTABL Heart: RRR no MRG Abd: NABS, Soft. Nondistended, Nontender Exts: Brisk capillary refill, warm and well perfused.  MSK: Decreased range of motion left shoulder.  Pain with motion. Left wrist: Slightly swollen at DRUJ.  Decreased range of motion.  Tender to palpation dorsal wrist mildly.  Pulses cap refill and grip  strength are intact.  Lab and Radiology Results X-ray images personally independently reviewed.  Right shoulder: Unchanged appearance of fracture without further displacement or angulation.  Left shoulder: Severe DJD no obvious fracture visible.  Left wrist: Severe DJD with no obvious fracture or malalignment.  Await formal radiology review.   Assessment and Plan: 78 y.o. female with right shoulder fracture.  Not much change in alignment on x-ray.  Pain not well controlled.  I suspect that the tramadol is slightly interfering with oxycodone and the opiate dosing is no longer cumulative.  Will discontinue tramadol and switch to oxycodone.  We will switch to 10 mg 3 times daily as needed of oxycodone.  Left shoulder: DJD likely exacerbation due to increased activity.  Hopeful with opiates pain will be better controlled.  Left wrist: No fracture visible.  Watchful waiting.  PDMP not reviewed this encounter. Orders Placed This Encounter  Procedures  . DG Shoulder Right    Standing Status:   Future    Number of Occurrences:   1    Standing Expiration Date:   11/26/2019    Order Specific Question:   Reason for Exam (SYMPTOM  OR DIAGNOSIS REQUIRED)    Answer:   follow up fracture    Order Specific Question:   Preferred imaging location?    Answer[facility]     Order Specific Question:   Radiology Contrast Protocol -  do NOT remove file path    Answer:   \\[email or web address]s.pdf  . DG Shoulder Left    Standing Status:   Future    Number of Occurrences:   1    Standing Expiration Date:   11/26/2019    Order Specific Question:   Reason for Exam (SYMPTOM  OR DIAGNOSIS REQUIRED)    Answer:   left shoudler pain and djd    Order Specific Question:   Preferred imaging location?    Answer:   [facility]    Order Specific Question:   Radiology Contrast Protocol - do NOT remove file path    Answer:    \\[email or web address]\Radiant\DXFluoroContrastProtocols.pdf  . DG Wrist Complete Left    Standing Status:   Future    Number of Occurrences:   1    Standing Expiration Date:   11/26/2019    Order Specific Question:   Reason for Exam (SYMPTOM  OR DIAGNOSIS REQUIRED)    Answer:   eval left wrist after fall    Order Specific Question:   Preferred imaging location?    Answer:   [facility]    Order Specific Question:   Radiology Contrast Protocol - do NOT remove file path    Answer:   \\[email or web address]\[email or web address]s.pdf   Meds ordered this encounter  Medications  . oxyCODONE-acetaminophen (PERCOCET) 10-325 MG tablet    Sig: Take 1 tablet by mouth every 8 (eight) hours as needed for pain.    Dispense:  90 tablet    Refill:  0    Replaces tramadol for chronic pain and for acute pain due to fracture     Historical information moved to improve visibility of documentation.  Past Medical History:  Diagnosis Date  . Anxiety   . Arthritis   . Depression   . Headache(784.0)    migrains  . Heart murmur   . Hypothyroidism   . Pre-diabetes   . Rheumatoid arthritis(714.0)    Past Surgical History:  Procedure Laterality Date  . ABDOMINAL HYSTERECTOMY    . ANTERIOR CERVICAL DECOMP/DISCECTOMY FUSION N/A 01/03/2018   Procedure: ANTERIOR CERVICAL DECOMPRESSION/DISCECTOMY FUSION, ALLOGRAFT, PLATE;  Surgeon: Eldred Manges, MD;  Location: [facility] OR;  Service: Orthopedics;  Laterality: N/A;  . APPENDECTOMY    . CHOLECYSTECTOMY    . REPLACEMENT TOTAL KNEE BILATERAL    . TONSILLECTOMY     as  a child  . TOTAL HIP ARTHROPLASTY     right and left   Social History   Tobacco Use  . Smoking status: Never Smoker  . Smokeless tobacco: Never Used  Substance Use Topics  . Alcohol use: No   family history includes Diabetes in an other family member; Heart disease (age of onset: 72) in her father; Stroke (age of onset: 78) in her mother.  Medications: Current Outpatient  Medications  Medication Sig Dispense Refill  . AMBULATORY NON FORMULARY MEDICATION Medication Name:hydraulic patient lift. Please fax to [facility] 1 each 0  . azelaic acid (AZELEX) 20 % cream APPLY TOPICALL 2 TIMES A DAY (MORNING AND EVENING). APPLY AFTER SKIN IS WASHED AND PATTED DRY. 30 g 6  . butalbital-acetaminophen-caffeine (FIORICET, ESGIC) 50-325-40 MG tablet Take 1 tablet by mouth 2 (two) times daily as needed. (Patient taking differently: Take 1 tablet by mouth 2 (two) times daily as needed for migraine. ) 14 tablet 5  . DULoxetine (CYMBALTA) 60 MG capsule Take 1 capsule (60 mg total) by mouth daily. 90 capsule 3  . folic acid (FOLVITE) 1 MG tablet  TAKE 1 TABLET BY MOUTH EVERY DAY (Patient taking differently: Take 1 mg by mouth daily. ) 90 tablet 2  . furosemide (LASIX) 20 MG tablet TAKE 1 TABLET BY MOUTH EVERY DAY AS NEEDED (Patient taking differently: Take 20 mg by mouth daily as needed for fluid. ) 90 tablet 1  . gabapentin (NEURONTIN) 300 MG capsule One tab PO qHS for a week, then BID for a week, then TID. May double weekly to a max of 3,600mg /day 180 capsule 3  . hydroxychloroquine (PLAQUENIL) 200 MG tablet 2 TABLET WITH FOOD OR MILK ONCE A DAY ORALLY 180 tablet 1  . levothyroxine (SYNTHROID, LEVOTHROID) 25 MCG tablet Take 1 tablet (25 mcg total) by mouth daily. 90 tablet 3  . losartan (COZAAR) 50 MG tablet Take 1 tablet (50 mg total) by mouth daily. 90 tablet 1  . meloxicam (MOBIC) 15 MG tablet TAKE ONE TABLET BY MOUTH EACH AM WITH BREAKFAST AS NEEDED FOR PAIN 30 tablet 2  . metFORMIN (GLUCOPHAGE) 500 MG tablet TAKE 1 TABLET BY MOUTH 2 TIMES DAILY WITH A MEAL. 180 tablet 2  . Multiple Vitamin (MULTIVITAMIN) tablet Take 1 tablet by mouth daily.    Marland Kitchen. MYRBETRIQ 25 MG TB24 tablet TAKE 1 TABLET BY MOUTH EVERYDAY AT BEDTIME 90 tablet 1  . nystatin (MYCOSTATIN/NYSTOP) powder APPLY 1 GRAM TOPICALLY 2 (TWO) TIMES DAILY. X 3 WEEKS. 60 g 1  . Omega-3 Fatty Acids (FISH OIL) 1200 MG CAPS Take 1,200  mg by mouth 2 (two) times a week. WITH OMEGA-3 360 MG    . potassium chloride (K-DUR) 10 MEQ tablet TAKE 1 TABLET (10 MEQ TOTAL) BY MOUTH 2 (TWO) TIMES DAILY. 60 tablet 11  . rosuvastatin (CRESTOR) 10 MG tablet Take 1 tablet (10 mg total) by mouth at bedtime. 90 tablet 3  . senna (SENOKOT) 8.6 MG tablet Take 1 tablet by mouth at bedtime.     . TURMERIC PO Take 1,000 mg by mouth daily.    . vitamin B-12 (CYANOCOBALAMIN) 100 MCG tablet Take 100 mcg by mouth at bedtime.     . Vitamin D, Ergocalciferol, (DRISDOL) 1.25 MG (50000 UT) CAPS capsule Take 1 capsule (50,000 Units total) by mouth every Sunday. 4 capsule 8  . oxyCODONE-acetaminophen (PERCOCET) 10-325 MG tablet Take 1 tablet by mouth every 8 (eight) hours as needed for pain. 90 tablet 0   No current facility-administered medications for this visit.    Allergies  Allergen Reactions  . Iodine Other (See Comments)    Shook violently and passed out.  . Codeine Other (See Comments)    Hallucinations  . Lipitor [Atorvastatin] Other (See Comments)    Myalgias     Discussed warning signs or symptoms. Please see discharge instructions. Patient expresses understanding.

## 2018-09-29 ENCOUNTER — Ambulatory Visit: Payer: Medicare Other | Admitting: Family Medicine

## 2018-10-07 NOTE — Telephone Encounter (Signed)
Note opened in error.

## 2018-10-10 ENCOUNTER — Ambulatory Visit: Payer: Medicare Other | Admitting: Family Medicine

## 2018-10-11 ENCOUNTER — Other Ambulatory Visit: Payer: Self-pay

## 2018-10-11 ENCOUNTER — Ambulatory Visit (INDEPENDENT_AMBULATORY_CARE_PROVIDER_SITE_OTHER): Payer: Medicare Other

## 2018-10-11 ENCOUNTER — Ambulatory Visit (INDEPENDENT_AMBULATORY_CARE_PROVIDER_SITE_OTHER): Payer: Medicare Other | Admitting: Family Medicine

## 2018-10-11 DIAGNOSIS — W19XXXA Unspecified fall, initial encounter: Secondary | ICD-10-CM | POA: Diagnosis not present

## 2018-10-11 DIAGNOSIS — R29898 Other symptoms and signs involving the musculoskeletal system: Secondary | ICD-10-CM | POA: Diagnosis not present

## 2018-10-11 DIAGNOSIS — S42291D Other displaced fracture of upper end of right humerus, subsequent encounter for fracture with routine healing: Secondary | ICD-10-CM | POA: Diagnosis not present

## 2018-10-11 DIAGNOSIS — M25531 Pain in right wrist: Secondary | ICD-10-CM

## 2018-10-11 MED ORDER — AMBULATORY NON FORMULARY MEDICATION: 0 refills | Status: DC

## 2018-10-11 MED ORDER — DICLOFENAC SODIUM 1 % TD GEL: 2.0000 g | Freq: Four times a day (QID) | TRANSDERMAL | 11 refills | Status: DC

## 2018-10-11 NOTE — Progress Notes (Signed)
Joann Maddox is a 78 y.o. female who presents to [facility] today for right shoulder fracture.  Patient was seen on May 28 after a fall where she was diagnosed with a impacted proximal humerus/humeral head fracture.  She was treated with a cuff and collar.  Additionally she had other locations of pain however x-rays of these joints showed no fractures but did show significant degenerative changes.  On recheck on June 1 she was treated with oxycodone and tramadol was discontinued because of concern for interaction with oxycodone.  She was continued with sling or cuff and collar.  In the interim she notes that she continues to experience quite a bit of shoulder pain.  She has also developed some pain in her wrist on the right side.  She denies any wrist pain after the fall.  She thinks the cuff and collar is putting extra pressure on her wrist.  She takes oxycodone which helps some but she still is not feeling well.  She also notes that she was in the process of trying to get a lift arranged prior to her injury.  She notes that she never heard back from the medical supply company.  Additionally she notes that she think she needs a shower chair.  ROS:  As above  Exam:  Heart rate 80 bpm.  Respiratory rate 12. General: Well Developed, well nourished, and in no acute distress.  Neuro/Psych: Alert and oriented x3, extra-ocular muscles intact, able to move all 4 extremities, sensation grossly intact. Skin: Warm and dry, no rashes noted.  Respiratory: Not using accessory muscles, speaking in full sentences, trachea midline.  Cardiovascular: Pulses palpable, no extremity edema. Abdomen: Does not appear distended. MSK:  Right shoulder normal-appearing nontender motion not tested. Right wrist normal-appearing not particularly tender normal motion. Pulses cap refill and sensation intact distally.    Lab and Radiology Results Xray images right shoulder  personally and independently reviewed.  Impacted fracture with no significant change in appearance and angulation compared to 09/26/18. Minimal healing present.  Await formal radiology review.     Assessment and Plan: 78 y.o. female with right shoulder fracture.  Radiographically not much healing yet but not change in alignment.  Patient still has quite a bit of pain.  I would expect it about 3 weeks after fracture of the motion should be settling down and pain should be improving.  Plan to recheck in 2 weeks.  If not improving pain will likely switch to CT scan to further characterize the fracture.  Additionally patient is having some wrist pain which I believe is due to the cuff and collar.  Plan to transition to a sling/shoulder immobilizer as needed.  Will use diclofenac gel in the wrist.  We will also write prescription for hydraulic lift chair and shower chair.   PDMP reviewed during this encounter. Orders Placed This Encounter  Procedures  . DG Shoulder Right    Standing Status:   Future    Number of Occurrences:   1    Standing Expiration Date:   12/11/2019    Order Specific Question:   Reason for Exam (SYMPTOM  OR DIAGNOSIS REQUIRED)    Answer:   follow up fracture    Order Specific Question:   Preferred imaging location?    [facility]:   [facility]    Order Specific Question:   Radiology Contrast Protocol - do NOT remove file path    Answer:   \\[email or web address]s.pdf  Meds ordered this encounter  Medications  . diclofenac sodium (VOLTAREN) 1 % GEL    Sig: Apply 2 g topically 4 (four) times daily. To affected joint.    Dispense:  100 g    Refill:  11  . AMBULATORY NON FORMULARY MEDICATION    Sig: Medication Name:hydraulic patient lift. Please fax to [facility]    Dispense:  1 each    Refill:  0  . DISCONTD: AMBULATORY NON FORMULARY MEDICATION    Sig: Shower chair. Use as needed. Impaired mobility. R29.6    Dispense:  1 each     Refill:  0  . AMBULATORY NON FORMULARY MEDICATION    Sig: Shower chair. Use as needed. Impaired mobility. R29.6    Dispense:  1 each    Refill:  0    Historical information moved to improve visibility of documentation.  Past Medical History:  Diagnosis Date  . Anxiety   . Arthritis   . Depression   . Headache(784.0)    migrains  . Heart murmur   . Hypothyroidism   . Pre-diabetes   . Rheumatoid arthritis(714.0)    Past Surgical History:  Procedure Laterality Date  . ABDOMINAL HYSTERECTOMY    . ANTERIOR CERVICAL DECOMP/DISCECTOMY FUSION N/A 01/03/2018   Procedure: ANTERIOR CERVICAL DECOMPRESSION/DISCECTOMY FUSION, ALLOGRAFT, PLATE;  Surgeon: Eldred Manges, MD;  Location: [facility] OR;  Service: Orthopedics;  Laterality: N/A;  . APPENDECTOMY    . CHOLECYSTECTOMY    . REPLACEMENT TOTAL KNEE BILATERAL    . TONSILLECTOMY     as  a child  . TOTAL HIP ARTHROPLASTY     right and left   Social History   Tobacco Use  . Smoking status: Never Smoker  . Smokeless tobacco: Never Used  Substance Use Topics  . Alcohol use: No   family history includes Diabetes in an other family member; Heart disease (age of onset: 6) in her father; Stroke (age of onset: 75) in her mother.  Medications: Current Outpatient Medications  Medication Sig Dispense Refill  . AMBULATORY NON FORMULARY MEDICATION Medication Name:hydraulic patient lift. Please fax to [facility] 1 each 0  . AMBULATORY NON FORMULARY MEDICATION Shower chair. Use as needed. Impaired mobility. R29.6 1 each 0  . azelaic acid (AZELEX) 20 % cream APPLY TOPICALL 2 TIMES A DAY (MORNING AND EVENING). APPLY AFTER SKIN IS WASHED AND PATTED DRY. 30 g 6  . butalbital-acetaminophen-caffeine (FIORICET, ESGIC) 50-325-40 MG tablet Take 1 tablet by mouth 2 (two) times daily as needed. (Patient taking differently: Take 1 tablet by mouth 2 (two) times daily as needed for migraine. ) 14 tablet 5  . diclofenac sodium (VOLTAREN) 1 % GEL Apply 2 g topically 4  (four) times daily. To affected joint. 100 g 11  . DULoxetine (CYMBALTA) 60 MG capsule Take 1 capsule (60 mg total) by mouth daily. 90 capsule 3  . folic acid (FOLVITE) 1 MG tablet TAKE 1 TABLET BY MOUTH EVERY DAY (Patient taking differently: Take 1 mg by mouth daily. ) 90 tablet 2  . furosemide (LASIX) 20 MG tablet TAKE 1 TABLET BY MOUTH EVERY DAY AS NEEDED (Patient taking differently: Take 20 mg by mouth daily as needed for fluid. ) 90 tablet 1  . gabapentin (NEURONTIN) 300 MG capsule One tab PO qHS for a week, then BID for a week, then TID. May double weekly to a max of 3,600mg /day 180 capsule 3  . hydroxychloroquine (PLAQUENIL) 200 MG tablet 2 TABLET WITH FOOD OR MILK ONCE A DAY  ORALLY 180 tablet 1  . levothyroxine (SYNTHROID, LEVOTHROID) 25 MCG tablet Take 1 tablet (25 mcg total) by mouth daily. 90 tablet 3  . losartan (COZAAR) 50 MG tablet Take 1 tablet (50 mg total) by mouth daily. 90 tablet 1  . meloxicam (MOBIC) 15 MG tablet TAKE ONE TABLET BY MOUTH EACH AM WITH BREAKFAST AS NEEDED FOR PAIN 30 tablet 2  . metFORMIN (GLUCOPHAGE) 500 MG tablet TAKE 1 TABLET BY MOUTH 2 TIMES DAILY WITH A MEAL. 180 tablet 2  . Multiple Vitamin (MULTIVITAMIN) tablet Take 1 tablet by mouth daily.    Marland Kitchen MYRBETRIQ 25 MG TB24 tablet TAKE 1 TABLET BY MOUTH EVERYDAY AT BEDTIME 90 tablet 1  . nystatin (MYCOSTATIN/NYSTOP) powder APPLY 1 GRAM TOPICALLY 2 (TWO) TIMES DAILY. X 3 WEEKS. 60 g 1  . Omega-3 Fatty Acids (FISH OIL) 1200 MG CAPS Take 1,200 mg by mouth 2 (two) times a week. WITH OMEGA-3 360 MG    . oxyCODONE-acetaminophen (PERCOCET) 10-325 MG tablet Take 1 tablet by mouth every 8 (eight) hours as needed for pain. 90 tablet 0  . potassium chloride (K-DUR) 10 MEQ tablet TAKE 1 TABLET (10 MEQ TOTAL) BY MOUTH 2 (TWO) TIMES DAILY. 60 tablet 11  . rosuvastatin (CRESTOR) 10 MG tablet Take 1 tablet (10 mg total) by mouth at bedtime. 90 tablet 3  . senna (SENOKOT) 8.6 MG tablet Take 1 tablet by mouth at bedtime.     .  TURMERIC PO Take 1,000 mg by mouth daily.    . vitamin B-12 (CYANOCOBALAMIN) 100 MCG tablet Take 100 mcg by mouth at bedtime.     . Vitamin D, Ergocalciferol, (DRISDOL) 1.25 MG (50000 UT) CAPS capsule Take 1 capsule (50,000 Units total) by mouth every Sunday. 4 capsule 8   No current facility-administered medications for this visit.    Allergies  Allergen Reactions  . Iodine Other (See Comments)    Shook violently and passed out.  . Codeine Other (See Comments)    Hallucinations  . Lipitor [Atorvastatin] Other (See Comments)    Myalgias      Discussed warning signs or symptoms. Please see discharge instructions. Patient expresses understanding.

## 2018-10-11 NOTE — Patient Instructions (Signed)
Thank you for coming in today. Contnue oxycodone for severe pain.  If not improving   Let me know if you do not hear form home health about the lift and shower chair.  You can get a shower chair usually from [facility].   OK to try a sling or even out of the cuff and color especially with resting.   If the shoulder pain is not improving we will get a CT scan.  Let me know a few days prior to the next visit. We will try to arrange for a CT scan the same day or before the follow up visit.   Apply the diclofenac gel to the wrist as needed and let me know how you do.

## 2018-10-12 ENCOUNTER — Telehealth: Payer: Self-pay

## 2018-10-12 NOTE — Telephone Encounter (Signed)
Dr. Georgina Snell managing the shoulder condition so I will leave it up to him in regards to what he would prefer to do with occupational therapy.

## 2018-10-13 NOTE — Telephone Encounter (Signed)
Spoke with therapist  At [facility] and Dr. Clovis Riley instructions given .

## 2018-10-13 NOTE — Telephone Encounter (Signed)
Please clarify. OK to Do OT on left shoulder. Avoid ROM right shoudler

## 2018-10-25 ENCOUNTER — Ambulatory Visit (INDEPENDENT_AMBULATORY_CARE_PROVIDER_SITE_OTHER): Payer: Medicare Other | Admitting: Family Medicine

## 2018-10-25 ENCOUNTER — Encounter: Payer: Self-pay | Admitting: Family Medicine

## 2018-10-25 VITALS — BP 103/42 | HR 80

## 2018-10-25 DIAGNOSIS — M25531 Pain in right wrist: Secondary | ICD-10-CM | POA: Diagnosis not present

## 2018-10-25 DIAGNOSIS — S42291D Other displaced fracture of upper end of right humerus, subsequent encounter for fracture with routine healing: Secondary | ICD-10-CM | POA: Diagnosis not present

## 2018-10-25 MED ORDER — OXYCODONE-ACETAMINOPHEN 10-325 MG PO TABS: 1.0000 | ORAL_TABLET | Freq: Three times a day (TID) | ORAL | 0 refills | Status: DC | PRN

## 2018-10-25 NOTE — Patient Instructions (Addendum)
Thank you for coming in today. You should hear about CT and wrist scheduling.  Recheck in 2 weeks.   Use oxycodone sparingly.   Ok to take your arm out of the sling or cuff and collar.

## 2018-10-25 NOTE — Progress Notes (Signed)
Joann Maddox is a 78 y.o. 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 Patient has pain and decreased ROM in right wrist, elbow, and shoulder. 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 Patient is taking two does of Oxycodone daily, sometimes three. Patient is trying to limit pain medication.  She has her shoulder pain continues to be quite bothersome has not improved much at all over the last few weeks.   ROS:  As above  Exam:  BP (!) 103/42    Pulse 80    SpO2 97%  Wt Readings from Last 5 Encounters:  09/26/18 209 lb (94.8 kg)  09/22/18 209 lb (94.8 kg)  08/22/18 209 lb (94.8 kg)  08/12/18 211 lb (95.7 kg)  07/01/18 212 lb (96.2 kg)   General: Well Developed, well nourished, and in no acute distress.  Neuro/Psych: Alert and oriented x3, extra-ocular muscles intact, able to move all 4 extremities, sensation grossly intact. Skin: Warm and dry, no rashes noted.  Respiratory: Not using accessory muscles, speaking in full sentences, trachea midline.  Cardiovascular: Pulses palpable, no extremity edema. Abdomen: Does not appear distended. MSK: Pain and decreased ROM in right wrist, right fingers, right elbow, right shoulder, left shoulder, left elbow, and left wrist.  Pulses cap refill and sensation intact distally.  Lab and Radiology Results Dg Shoulder Right  Result Date: 10/12/2018 CLINICAL DATA:  Right proximal humerus surgical neck fracture EXAM: RIGHT SHOULDER - 2+ VIEW COMPARISON:  09/26/2018 FINDINGS: There is redemonstration of the proximal humerus impacted surgical neck and head fracture. Fracture lines remain visible. Slight increased sclerotic change suggest early healing. Humeral head is slightly subluxed inferiorly in relation to the glenoid. Humeral head position could also be related to joint effusion.  Degenerative changes of the [place] joint without separation. Included right chest unremarkable. IMPRESSION: Proximal humerus impacted surgical neck and head fracture with slight increased sclerosis suggesting early healing. Otherwise stable exam. Electronically Signed   By: Judie Petit.  Shick M.D.   On: 10/12/2018 09:27   Dg Shoulder Right  Result Date: 09/26/2018 CLINICAL DATA:  Follow-up shoulder fracture. EXAM: RIGHT SHOULDER - 2+ VIEW COMPARISON:  Sep 22, 2018 FINDINGS: The impacted fracture of the humeral head and neck is stable. The humeral head is low lying consistent with a joint effusion. No dislocation identified. The scapula and distal clavicle are intact. IMPRESSION: Comminuted impacted fracture of the radial head/neck with a probable joint effusion but no dislocation. Electronically Signed   By: Gerome Sam III M.D   On: 09/26/2018 19:55   Dg Shoulder Right  Result Date: 09/22/2018 CLINICAL DATA:  Pain status post fall EXAM: RIGHT SHOULDER - 2+ VIEW COMPARISON:  None. FINDINGS: There is some inferior subluxation of the humeral head with respect to the glenoid. There is a step-off involving the humeral head. Multiple lucencies course through the humeral head. There may be some collapse of the superior aspect of the humeral head, age indeterminate. Degenerative changes are noted in the glenoid and AC joint. A joint effusion is likely present. Osteopenia is noted. IMPRESSION: 1. Acute impacted fracture of the proximal humerus. 2. No frank dislocation. There is some mild inferior subluxation of the humeral head with respect to the glenoid. Electronically Signed   By: Katherine Mantle M.D.   On: 09/22/2018 14:50   Dg Elbow Complete Right  Result Date: 09/22/2018 CLINICAL  DATA:  Pain status post fall EXAM: RIGHT ELBOW - COMPLETE 3+ VIEW COMPARISON:  None. FINDINGS: There are end-stage degenerative changes of the right elbow. A moderate-sized joint effusion is noted. There is no definite displaced fracture  or dislocation. Detection of a fracture is limited by extensive degenerative changes. IMPRESSION: 1. Advanced degenerative changes of the right elbow. 2. No definite acute displaced fracture or dislocation. However, evaluation is limited by extensive degenerative changes. 3. An elbow joint effusion is noted. If there is high clinical suspicion for an occult fracture, follow-up with CT is recommended. Electronically Signed   By: Constance Holster M.D.   On: 09/22/2018 14:48   Dg Wrist Complete Left  Result Date: 09/26/2018 CLINICAL DATA:  Pain after fall EXAM: LEFT WRIST - COMPLETE 3+ VIEW COMPARISON:  None. FINDINGS: Severe degenerative changes in the thumb. Degenerative changes in the wrist. The scapholunate distance/interval is borderline measuring 3.1 mm. No acute fractures identified. IMPRESSION: Degenerative changes in the wrist. No acute fractures. The scapholunate interval/distance is borderline. Recommend clinical correlation. Electronically Signed   By: Dorise Bullion III M.D   On: 09/26/2018 19:58   Dg Shoulder Left  Result Date: 09/26/2018 CLINICAL DATA:  Pain. EXAM: LEFT SHOULDER - 2+ VIEW COMPARISON:  None. FINDINGS: AC joint degenerative changes. Significant glenohumeral degenerative changes with large osteophytes. No fractures, dislocations, or bony lesions. IMPRESSION: Degenerative changes.  No fracture or dislocation. Electronically Signed   By: Dorise Bullion III M.D   On: 09/26/2018 19:56   I personally (independently) visualized and performed the interpretation of the images attached in this note.     Assessment and Plan: 78 y.o. female with pain in right wrist, right elbow, and right shoulder after right slipped proximal humeral epiphysis due to fall. Patient has continuing pain in left wrist, elbow, and shoulder due to OA. Right wrist pain is likely due to motion restriction due to right arm sling. Counseled to continue with limiting motion in right arm. Sling can be taken off  if right wrist pain continues.   As she has not improved at all I think the next type is reasonable to proceed with CT scan of the shoulder to evaluate fracture better.  She should be having some improvement at this point.  Addition we will check a right wrist x-ray as well given she is now having pain in this area as well.  Continue oxycodone for limited pain control.  Recheck 2 weeks.    PDMP not reviewed this encounter. Orders Placed This Encounter  Procedures   CT SHOULDER RIGHT WO CONTRAST    Standing Status:   Future    Standing Expiration Date:   01/25/2020    Order Specific Question:   Preferred imaging location?    Answer:   [facility]    Order Specific Question:   Radiology Contrast Protocol - do NOT remove file path    Answer:   \[email or web address]   DG Wrist Complete Right    Standing Status:   Future    Standing Expiration Date:   12/25/2019    Order Specific Question:   Reason for Exam (SYMPTOM  OR DIAGNOSIS REQUIRED)    Answer:   eval wrist pain    Order Specific Question:   Preferred imaging location?    Answer:   [facility]    Order Specific Question:   Radiology Contrast Protocol - do NOT remove file path    Answer:   \[email or web address]   Meds ordered this encounter  Medications   oxyCODONE-acetaminophen (PERCOCET) 10-325 MG tablet    Sig: Take 1 tablet by mouth every 8 (eight) hours as needed for pain.    Dispense:  90 tablet    Refill:  0    Replaces tramadol for chronic pain and for acute pain due to fracture    Historical information moved to improve visibility of documentation.  Past Medical History:  Diagnosis Date   Anxiety    Arthritis    Depression    Headache(784.0)    migrains   Heart murmur    Hypothyroidism    Pre-diabetes    Rheumatoid arthritis(714.0)    Past Surgical History:  Procedure Laterality Date   ABDOMINAL HYSTERECTOMY      ANTERIOR CERVICAL DECOMP/DISCECTOMY FUSION N/A 01/03/2018   Procedure: ANTERIOR CERVICAL DECOMPRESSION/DISCECTOMY FUSION, ALLOGRAFT, PLATE;  Surgeon: Eldred Manges, MD;  Location: [facility] OR;  Service: Orthopedics;  Laterality: N/A;   APPENDECTOMY     CHOLECYSTECTOMY     REPLACEMENT TOTAL KNEE BILATERAL     TONSILLECTOMY     as  a child   TOTAL HIP ARTHROPLASTY     right and left   Social History   Tobacco Use   Smoking status: Never Smoker   Smokeless tobacco: Never Used  Substance Use Topics   Alcohol use: No   family history includes Diabetes in an other family member; Heart disease (age of onset: 74) in her father; Stroke (age of onset: 58) in her mother.  Medications: Current Outpatient Medications  Medication Sig Dispense Refill   AMBULATORY NON FORMULARY MEDICATION Medication Name:hydraulic patient lift. Please fax to [place] 1 each 0   AMBULATORY NON FORMULARY MEDICATION Shower chair. Use as needed. Impaired mobility. R29.6 1 each 0   azelaic acid (AZELEX) 20 % cream APPLY TOPICALL 2 TIMES A DAY (MORNING AND EVENING). APPLY AFTER SKIN IS WASHED AND PATTED DRY. 30 g 6   butalbital-acetaminophen-caffeine (FIORICET, ESGIC) 50-325-40 MG tablet Take 1 tablet by mouth 2 (two) times daily as needed. (Patient taking differently: Take 1 tablet by mouth 2 (two) times daily as needed for migraine. ) 14 tablet 5   diclofenac sodium (VOLTAREN) 1 % GEL Apply 2 g topically 4 (four) times daily. To affected joint. 100 g 11   DULoxetine (CYMBALTA) 60 MG capsule Take 1 capsule (60 mg total) by mouth daily. 90 capsule 3   folic acid (FOLVITE) 1 MG tablet TAKE 1 TABLET BY MOUTH EVERY DAY (Patient taking differently: Take 1 mg by mouth daily. ) 90 tablet 2   furosemide (LASIX) 20 MG tablet TAKE 1 TABLET BY MOUTH EVERY DAY AS NEEDED (Patient taking differently: Take 20 mg by mouth daily as needed for fluid. ) 90 tablet 1   gabapentin (NEURONTIN) 300 MG capsule One tab PO qHS for a week,  then BID for a week, then TID. May double weekly to a max of 3,600mg /day 180 capsule 3   hydroxychloroquine (PLAQUENIL) 200 MG tablet 2 TABLET WITH FOOD OR MILK ONCE A DAY ORALLY 180 tablet 1   levothyroxine (SYNTHROID, LEVOTHROID) 25 MCG tablet Take 1 tablet (25 mcg total) by mouth daily. 90 tablet 3   losartan (COZAAR) 50 MG tablet Take 1 tablet (50 mg total) by mouth daily. 90 tablet 1   meloxicam (MOBIC) 15 MG tablet TAKE ONE TABLET BY MOUTH EACH AM WITH BREAKFAST AS NEEDED FOR PAIN 30 tablet 2   metFORMIN (GLUCOPHAGE) 500 MG tablet TAKE 1 TABLET BY MOUTH 2 TIMES DAILY WITH A  MEAL. 180 tablet 2   Multiple Vitamin (MULTIVITAMIN) tablet Take 1 tablet by mouth daily.     MYRBETRIQ 25 MG TB24 tablet TAKE 1 TABLET BY MOUTH EVERYDAY AT BEDTIME 90 tablet 1   nystatin (MYCOSTATIN/NYSTOP) powder APPLY 1 GRAM TOPICALLY 2 (TWO) TIMES DAILY. X 3 WEEKS. 60 g 1   Omega-3 Fatty Acids (FISH OIL) 1200 MG CAPS Take 1,200 mg by mouth 2 (two) times a week. WITH OMEGA-3 360 MG     oxyCODONE-acetaminophen (PERCOCET) 10-325 MG tablet Take 1 tablet by mouth every 8 (eight) hours as needed for pain. 90 tablet 0   potassium chloride (K-DUR) 10 MEQ tablet TAKE 1 TABLET (10 MEQ TOTAL) BY MOUTH 2 (TWO) TIMES DAILY. 60 tablet 11   rosuvastatin (CRESTOR) 10 MG tablet Take 1 tablet (10 mg total) by mouth at bedtime. 90 tablet 3   senna (SENOKOT) 8.6 MG tablet Take 1 tablet by mouth at bedtime.      TURMERIC PO Take 1,000 mg by mouth daily.     vitamin B-12 (CYANOCOBALAMIN) 100 MCG tablet Take 100 mcg by mouth at bedtime.      Vitamin D, Ergocalciferol, (DRISDOL) 1.25 MG (50000 UT) CAPS capsule Take 1 capsule (50,000 Units total) by mouth every Sunday. 4 capsule 8   No current facility-administered medications for this visit.    Allergies  Allergen Reactions   Iodine Other (See Comments)    Shook violently and passed out.   Codeine Other (See Comments)    Hallucinations   Lipitor [Atorvastatin] Other  (See Comments)    Myalgias      Discussed warning signs or symptoms. Please see discharge instructions. Patient expresses understanding.   I personally was present and performed or re-performed the history, physical exam and medical decision-making activities of this service and have verified that the service and findings are accurately documented in the student's note. ___________________________________________ Clementeen GrahamEvan Barre Aydelott M.D., ABFM., CAQSM. Primary Care and Sports Medicine Adjunct Instructor of Family Medicine  [employer]

## 2018-10-27 ENCOUNTER — Ambulatory Visit: Payer: Medicare Other | Admitting: Family Medicine

## 2018-10-31 ENCOUNTER — Ambulatory Visit: Payer: Medicare Other | Admitting: Family Medicine

## 2018-10-31 ENCOUNTER — Ambulatory Visit (INDEPENDENT_AMBULATORY_CARE_PROVIDER_SITE_OTHER): Payer: Medicare Other | Admitting: Family Medicine

## 2018-10-31 ENCOUNTER — Other Ambulatory Visit: Payer: Self-pay

## 2018-10-31 ENCOUNTER — Encounter: Payer: Self-pay | Admitting: Family Medicine

## 2018-10-31 VITALS — BP 138/61 | HR 86 | Ht 62.0 in | Wt 210.0 lb

## 2018-10-31 DIAGNOSIS — F320 Major depressive disorder, single episode, mild: Secondary | ICD-10-CM

## 2018-10-31 DIAGNOSIS — G479 Sleep disorder, unspecified: Secondary | ICD-10-CM

## 2018-10-31 DIAGNOSIS — M7989 Other specified soft tissue disorders: Secondary | ICD-10-CM

## 2018-10-31 DIAGNOSIS — R600 Localized edema: Secondary | ICD-10-CM

## 2018-10-31 DIAGNOSIS — I890 Lymphedema, not elsewhere classified: Secondary | ICD-10-CM

## 2018-10-31 MED ORDER — FLUOXETINE HCL 20 MG PO TABS: ORAL_TABLET | ORAL | 1 refills | Status: DC

## 2018-10-31 MED ORDER — DULOXETINE HCL 30 MG PO CPEP: 30.0000 mg | ORAL_CAPSULE | Freq: Every day | ORAL | 3 refills | Status: DC

## 2018-10-31 NOTE — Assessment & Plan Note (Signed)
Cussed options.  I really think she would benefit from some therapy and counseling.  She did lose her brother this year and is also just really struggling with wanting to live with her chronic diseases.  I think this will be helpful she was very hesitant but I encouraged her to think about it.  We also discussed the possibility of discontinuing the duloxetine and trying something else for mood and she would like to do so some been a decrease her Cymbalta to 30 mg daily for 10 days and then switch to fluoxetine.  I will see her back in about 4 to 5 weeks.

## 2018-10-31 NOTE — Progress Notes (Signed)
Established Patient Office Visit  Subjective:  Patient ID: Joann Maddox, female    DOB: 10/24/1940  Age: 78 y.o. MRN: [id number]  CC:  Chief Complaint  Patient presents with  . Leg Swelling  . Foot Swelling    HPI Joann Maddox presents for of lower extremity swelling.  We discussed at the last visit in May about the possibility of lymphedema.  I did want her to try increasing her Lasix to 40 mg for 2 days just to see if it made a difference or not.  We did discuss considering compression stockings and/or referral to the lymphedema clinic for further evaluation.  She has been wearing her compression stockings and her husband is helping her place those.  She does feel like it helps some but they still stay chronically swollen.  Once in a while the get a little bit better but most of the time she wakes up with them swollen and goes to bed with them swollen the left is definitely worse than the right.  We discussed possible referral to lymphedema clinic previously.  Unfortunately, since I last saw her she actually fell and x-rayed her humerus.  She has been following with Dr. Denyse Amassorey for this.  They are planning on getting a CT scheduled.  Someone had called her to schedule it but she was unable to go same day they were supposed to call her back and give her a new time and date but says she has not heard back from anyone.  Husband is also concerned because she seems to be getting her days and nights mixed up.   [name] says that she loves to read so usually she goes to bed around 12 but then stays in bed and reads until about 3 or 3:30 in the morning and that is really about the time she actually falls asleep.  Then she sleeps most of the morning, though it is interrupted by having to urinate and then sometimes will take naps in the afternoon.  Has been is worried about the combination of the Cymbalta and the pain medication and whether or not that could actually be causing some daytime sedation.  Past  Medical History:  Diagnosis Date  . Anxiety   . Arthritis   . Depression   . Headache(784.0)    migrains  . Heart murmur   . Hypothyroidism   . Pre-diabetes   . Rheumatoid arthritis(714.0)     Past Surgical History:  Procedure Laterality Date  . ABDOMINAL HYSTERECTOMY    . ANTERIOR CERVICAL DECOMP/DISCECTOMY FUSION N/A 01/03/2018   Procedure: ANTERIOR CERVICAL DECOMPRESSION/DISCECTOMY FUSION, ALLOGRAFT, PLATE;  Surgeon: Eldred MangesYates, Mark C, MD;  Location: [facility];  Service: Orthopedics;  Laterality: N/A;  . APPENDECTOMY    . CHOLECYSTECTOMY    . REPLACEMENT TOTAL KNEE BILATERAL    . TONSILLECTOMY     as  a child  . TOTAL HIP ARTHROPLASTY     right and left    Family History  Problem Relation Age of Onset  . Heart disease Father 6177  . Diabetes Other        aunt   . Stroke Mother [id number]    Social History   Socioeconomic History  . Marital status: Married    Spouse name: Dorene SorrowJerry  . Number of children: 2  . Years of education: Not on file  . Highest education level: Not on file  Occupational History  . Occupation: Retired Runner, broadcasting/film/videoTeacher    Comment: [employer]  Social  Needs  . Financial resource strain: Not on file  . Food insecurity    Worry: Not on file    Inability: Not on file  . Transportation needs    Medical: Not on file    Non-medical: Not on file  Tobacco Use  . Smoking status: Never Smoker  . Smokeless tobacco: Never Used  Substance and Sexual Activity  . Alcohol use: No  . Drug use: Not on file  . Sexual activity: Not on file  Lifestyle  . Physical activity    Days per week: Not on file    Minutes per session: Not on file  . Stress: Not on file  Relationships  . Social Herbalist on phone: Not on file    Gets together: Not on file    Attends religious service: Not on file    Active member of club or organization: Not on file    Attends meetings of clubs or organizations: Not on file    Relationship status: Not on file  . Intimate partner  violence    Fear of current or ex partner: Not on file    Emotionally abused: Not on file    Physically abused: Not on file    Forced sexual activity: Not on file  Other Topics Concern  . Not on file  Social History Narrative   No regular exercise. 2 caffeine drinks per day.     Outpatient Medications Prior to Visit  Medication Sig Dispense Refill  . AMBULATORY NON FORMULARY MEDICATION Medication Name:hydraulic patient lift. Please fax to [facility] 1 each 0  . AMBULATORY NON FORMULARY MEDICATION Shower chair. Use as needed. Impaired mobility. R29.6 1 each 0  . azelaic acid (AZELEX) 20 % cream APPLY TOPICALL 2 TIMES A DAY (MORNING AND EVENING). APPLY AFTER SKIN IS WASHED AND PATTED DRY. 30 g 6  . butalbital-acetaminophen-caffeine (FIORICET, ESGIC) 50-325-40 MG tablet Take 1 tablet by mouth 2 (two) times daily as needed. (Patient taking differently: Take 1 tablet by mouth 2 (two) times daily as needed for migraine. ) 14 tablet 5  . diclofenac sodium (VOLTAREN) 1 % GEL Apply 2 g topically 4 (four) times daily. To affected joint. 440 g 11  . folic acid (FOLVITE) 1 MG tablet TAKE 1 TABLET BY MOUTH EVERY DAY (Patient taking differently: Take 1 mg by mouth daily. ) 90 tablet 2  . furosemide (LASIX) 20 MG tablet TAKE 1 TABLET BY MOUTH EVERY DAY AS NEEDED (Patient taking differently: Take 20 mg by mouth daily as needed for fluid. ) 90 tablet 1  . gabapentin (NEURONTIN) 300 MG capsule One tab PO qHS for a week, then BID for a week, then TID. May double weekly to a max of 3,600mg /day 180 capsule 3  . hydroxychloroquine (PLAQUENIL) 200 MG tablet 2 TABLET WITH FOOD OR MILK ONCE A DAY ORALLY 180 tablet 1  . levothyroxine (SYNTHROID, LEVOTHROID) 25 MCG tablet Take 1 tablet (25 mcg total) by mouth daily. 90 tablet 3  . losartan (COZAAR) 50 MG tablet Take 1 tablet (50 mg total) by mouth daily. 90 tablet 1  . meloxicam (MOBIC) 15 MG tablet TAKE ONE TABLET BY MOUTH EACH AM WITH BREAKFAST AS NEEDED FOR PAIN 30  tablet 2  . metFORMIN (GLUCOPHAGE) 500 MG tablet TAKE 1 TABLET BY MOUTH 2 TIMES DAILY WITH A MEAL. 180 tablet 2  . Multiple Vitamin (MULTIVITAMIN) tablet Take 1 tablet by mouth daily.    Marland Kitchen MYRBETRIQ 25 MG TB24 tablet TAKE  1 TABLET BY MOUTH EVERYDAY AT BEDTIME 90 tablet 1  . nystatin (MYCOSTATIN/NYSTOP) powder APPLY 1 GRAM TOPICALLY 2 (TWO) TIMES DAILY. X 3 WEEKS. 60 g 1  . Omega-3 Fatty Acids (FISH OIL) 1200 MG CAPS Take 1,200 mg by mouth 2 (two) times a week. WITH OMEGA-3 360 MG    . oxyCODONE-acetaminophen (PERCOCET) 10-325 MG tablet Take 1 tablet by mouth every 8 (eight) hours as needed for pain. 90 tablet 0  . potassium chloride (K-DUR) 10 MEQ tablet TAKE 1 TABLET (10 MEQ TOTAL) BY MOUTH 2 (TWO) TIMES DAILY. 60 tablet 11  . rosuvastatin (CRESTOR) 10 MG tablet Take 1 tablet (10 mg total) by mouth at bedtime. 90 tablet 3  . senna (SENOKOT) 8.6 MG tablet Take 1 tablet by mouth at bedtime.     . TURMERIC PO Take 1,000 mg by mouth daily.    . vitamin B-12 (CYANOCOBALAMIN) 100 MCG tablet Take 100 mcg by mouth at bedtime.     . Vitamin D, Ergocalciferol, (DRISDOL) 1.25 MG (50000 UT) CAPS capsule Take 1 capsule (50,000 Units total) by mouth every Sunday. 4 capsule 8  . DULoxetine (CYMBALTA) 60 MG capsule Take 1 capsule (60 mg total) by mouth daily. 90 capsule 3   No facility-administered medications prior to visit.     Allergies  Allergen Reactions  . Iodine Other (See Comments)    Shook violently and passed out.  . Codeine Other (See Comments)    Hallucinations  . Lipitor [Atorvastatin] Other (See Comments)    Myalgias    ROS Review of Systems    Objective:    Physical Exam  BP 138/61   Pulse 86   Ht 5\' 2"  (1.575 m)   Wt 210 lb (95.3 kg)   SpO2 95%   BMI 38.41 kg/m  Wt Readings from Last 3 Encounters:  10/31/18 210 lb (95.3 kg)  09/26/18 209 lb (94.8 kg)  09/22/18 209 lb (94.8 kg)     Health Maintenance Due  Topic Date Due  . OPHTHALMOLOGY EXAM  06/25/1950    There  are no preventive care reminders to display for this patient.  Lab Results  Component Value Date   TSH 0.48 08/22/2018   Lab Results  Component Value Date   WBC 6.3 08/22/2018   HGB 11.0 (L) 08/22/2018   HCT 33.3 (L) 08/22/2018   MCV 86.7 08/22/2018   PLT 264 08/22/2018   Lab Results  Component Value Date   NA 142 08/22/2018   K 4.3 08/22/2018   CO2 30 08/22/2018   GLUCOSE 105 (H) 08/22/2018   BUN 21 08/22/2018   CREATININE 0.67 08/22/2018   BILITOT 0.5 08/22/2018   ALKPHOS 66 12/23/2017   AST 22 08/22/2018   ALT 15 08/22/2018   PROT 6.3 08/22/2018   ALBUMIN 3.7 12/23/2017   CALCIUM 9.7 08/22/2018   ANIONGAP 10 01/04/2018   Lab Results  Component Value Date   CHOL 128 12/03/2017   Lab Results  Component Value Date   HDL 77 12/03/2017   Lab Results  Component Value Date   LDLCALC 35 12/03/2017   Lab Results  Component Value Date   TRIG 75 12/03/2017   Lab Results  Component Value Date   CHOLHDL 1.7 12/03/2017   Lab Results  Component Value Date   HGBA1C 5.2 07/01/2018      Assessment & Plan:   Problem List Items Addressed This Visit      Other   Depression, major, single episode, mild (HCC) -  Primary    Cussed options.  I really think she would benefit from some therapy and counseling.  She did lose her brother this year and is also just really struggling with wanting to live with her chronic diseases.  I think this will be helpful she was very hesitant but I encouraged her to think about it.  We also discussed the possibility of discontinuing the duloxetine and trying something else for mood and she would like to do so some been a decrease her Cymbalta to 30 mg daily for 10 days and then switch to fluoxetine.  I will see her back in about 4 to 5 weeks.      Relevant Medications   DULoxetine (CYMBALTA) 30 MG capsule   FLUoxetine (PROZAC) 20 MG tablet   Bilateral lower extremity edema    We will go ahead and refer her to lymphedema clinic-going up  on her Lasix really did not make a difference I did make her urinate more but it did not really improve her lower extremity edema.  She says she wakes up swollen and goes to bed swollen.  I really think it is most consistent with lymphedema.  She has been trying to wear her compression socks.  Her husband helps her put them on in the mornings.       Other Visit Diagnoses    Lymph edema       Relevant Orders   PR COMPLEX LYMPHEDEMA THERAPY,   Left leg swelling       Relevant Orders   PR COMPLEX LYMPHEDEMA THERAPY,   Sleep disturbance          Assistant right arm pain status post fracture-Dr. Denyse Amass has ordered a CT.  Encouraged her to go downstairs after her appointment today to go ahead and get this scheduled.  Sleep disturbance-we discussed strategies around moving her sleep forward by 15-minute increments every 5 to 7 days to eventually get her sleep cycle back on track and trying to reduce/minimize naps.  Ever since her arm fracture she just has been much more sedentary and has not been able to do a lot so just encouraged her to still walk and do her stretches that she is learning with physical therapy even if she cannot do everything.  I want her to stay up and active and alert during the daytime.  We also discussed not reading in bed and doing that may be somewhere else in the house or in a chair and then going to bed to lay down when she is actually sleepy.  She really only has 1 cup of tea first thing in the morning so no excess caffeine intake.  Meds ordered this encounter  Medications  . DULoxetine (CYMBALTA) 30 MG capsule    Sig: Take 1 capsule (30 mg total) by mouth daily.    Dispense:  10 capsule    Refill:  3  . FLUoxetine (PROZAC) 20 MG tablet    Sig: 1/2 tab po QD x 10 days then increase to whole tab daily    Dispense:  30 tablet    Refill:  1    Follow-up: Return in about 5 weeks (around 12/05/2018) for New start medication.    Nani Gasser, MD

## 2018-10-31 NOTE — Patient Instructions (Signed)
Decrease her Cymbalta to 30 mg daily.  New prescription sent to the pharmacy for 10 days worth.  When you get to the end of that just stop and then you can start the new prescription for fluoxetine.

## 2018-10-31 NOTE — Assessment & Plan Note (Signed)
We will go ahead and refer her to lymphedema clinic-going up on her Lasix really did not make a difference I did make her urinate more but it did not really improve her lower extremity edema.  She says she wakes up swollen and goes to bed swollen.  I really think it is most consistent with lymphedema.  She has been trying to wear her compression socks.  Her husband helps her put them on in the mornings.

## 2018-11-03 ENCOUNTER — Ambulatory Visit (INDEPENDENT_AMBULATORY_CARE_PROVIDER_SITE_OTHER): Payer: Medicare Other

## 2018-11-03 ENCOUNTER — Other Ambulatory Visit: Payer: Self-pay

## 2018-11-03 DIAGNOSIS — M25531 Pain in right wrist: Secondary | ICD-10-CM

## 2018-11-03 DIAGNOSIS — S42291D Other displaced fracture of upper end of right humerus, subsequent encounter for fracture with routine healing: Secondary | ICD-10-CM

## 2018-11-07 ENCOUNTER — Other Ambulatory Visit: Payer: Self-pay

## 2018-11-07 ENCOUNTER — Encounter: Payer: Self-pay | Admitting: Family Medicine

## 2018-11-07 ENCOUNTER — Ambulatory Visit (INDEPENDENT_AMBULATORY_CARE_PROVIDER_SITE_OTHER): Payer: Medicare Other | Admitting: Family Medicine

## 2018-11-07 VITALS — BP 133/78 | HR 85 | Temp 98.1°F | Wt 208.0 lb

## 2018-11-07 DIAGNOSIS — S42291D Other displaced fracture of upper end of right humerus, subsequent encounter for fracture with routine healing: Secondary | ICD-10-CM

## 2018-11-07 NOTE — Progress Notes (Signed)
Joann Maddox is a 78 y.o. female who presents to [facility] today for right shoulder fracture.  Patient was seen in late May after a fall and found to have a comminuted proximal humerus/humeral head fracture on the right side.  She has multiple medical comorbidities and was thought to not be a very good surgical candidate.  Additionally she she noted that she would like to try to avoid surgery if possible.  She was treated initially with a cuff and collar but found that to be very uncomfortable on around her neck and around her wrist.  She was transition to a simple sling which she found to be a little more comfortable.  However she continued to have significant pain in her shoulder over the weeks.  Now at about 6 weeks after her initial injury she notes continued pain in her shoulder.  She notes is a little bit better than it has been in the past but still quite significant.  At this point she is little more willing to consider surgery.  She has an established relationship with Dr. Ophelia CharterYates at [facility].    ROS:  As above  Exam:  BP 133/78    Pulse 85    Temp 98.1 F (36.7 C) (Oral)    Wt 208 lb (94.3 kg)    BMI 38.04 kg/m  Wt Readings from Last 5 Encounters:  11/07/18 208 lb (94.3 kg)  10/31/18 210 lb (95.3 kg)  09/26/18 209 lb (94.8 kg)  09/22/18 209 lb (94.8 kg)  08/22/18 209 lb (94.8 kg)   General: Well Developed, well nourished, and in no acute distress.  Neuro/Psych: Alert and oriented x3, extra-ocular muscles intact, able to move all 4 extremities, sensation grossly intact. Skin: Warm and dry, no rashes noted.  Respiratory: Not using accessory muscles, speaking in full sentences, trachea midline.  Cardiovascular: Pulses palpable, no extremity edema. Abdomen: Does not appear distended. MSK: Right shoulder: Normal-appearing decreased motion.  Tender to palpation mildly. Right wrist: Normal-appearing mildly tender normal  motion.  Pulses cap refill sensation intact distally.    Lab and Radiology Results EXAM: CT OF THE UPPER RIGHT EXTREMITY WITHOUT CONTRAST  TECHNIQUE: Multidetector CT imaging of the upper right extremity was performed according to the standard protocol.  COMPARISON:  Radiographs dated 10/11/2018, 09/26/2018 and 09/22/2018  FINDINGS: Bones/Joint/Cartilage  There is a severely comminuted impacted fracture of the right humeral head. The fracture involves the articular surface of the humeral head at multiple sites.  There appear to be some areas of bone union. There multiple tiny bone fragments within the glenohumeral joint. Glenohumeral joint effusion.  Slight osteophyte formation on the glenoid. Scapula is intact.  Moderate AC joint arthropathy. Type 1 acromion.  Muscles and Tendons  There is no atrophy of the muscles of the rotator cuff.  Soft tissues  Negative.  IMPRESSION: 1. Severely comminuted impacted fracture of the right humeral head. There has been partial healing of the fractures. The fracture involves the articular surface of the humeral head at multiple sites. 2. Glenohumeral joint effusion.   Electronically Signed   By: Francene BoyersJames  Maxwell M.D.   On: 11/04/2018 06:51 I personally (independently) visualized and performed the interpretation of the images attached in this note.      Assessment and Plan: 78 y.o. female with right shoulder fracture.  Significant comminuted fracture on CT scan correlating to findings previously seen on x-ray.  She does have a little bit of healing present on  CT scan that was not very appreciable on x-ray per my read's previously.  If she is now about 6 weeks after injury and continued to be quite symptomatic.  I still think she is not a very good surgical candidate but I think at this point is reasonable to have an evaluation with orthopedic surgery.  This will also serve as a second opinion.  If surgery then  orthopedics to take over care if continued conservative management with immobilization then I am happy to proceed with that here in my clinic.  In the interim continue medication management for pain control.  Recheck in a few weeks if not proceed with surgery.  I spent 25 minutes with this patient, greater than 50% was face-to-face time counseling regarding treatment options surgical planning next steps and backup plan.   PDMP not reviewed this encounter. Orders Placed This Encounter  Procedures   Ambulatory referral to Orthopedic Surgery    Referral Priority:   Routine    Referral Type:   Surgical    Referral Reason:   Specialty Services Required    Requested Specialty:   Orthopedic Surgery    Number of Visits Requested:   1   No orders of the defined types were placed in this encounter.   Historical information moved to improve visibility of documentation.  Past Medical History:  Diagnosis Date   Anxiety    Arthritis    Depression    Headache(784.0)    migrains   Heart murmur    Hypothyroidism    Pre-diabetes    Rheumatoid arthritis(714.0)    Past Surgical History:  Procedure Laterality Date   ABDOMINAL HYSTERECTOMY     ANTERIOR CERVICAL DECOMP/DISCECTOMY FUSION N/A 01/03/2018   Procedure: ANTERIOR CERVICAL DECOMPRESSION/DISCECTOMY FUSION, ALLOGRAFT, PLATE;  Surgeon: Marybelle Killings, MD;  Location: [place];  Service: Orthopedics;  Laterality: N/A;   APPENDECTOMY     CHOLECYSTECTOMY     REPLACEMENT TOTAL KNEE BILATERAL     TONSILLECTOMY     as  a child   TOTAL HIP ARTHROPLASTY     right and left   Social History   Tobacco Use   Smoking status: Never Smoker   Smokeless tobacco: Never Used  Substance Use Topics   Alcohol use: No   family history includes Diabetes in an other family member; Heart disease (age of onset: 1) in her father; Stroke (age of onset: 72) in her mother.  Medications: Current Outpatient Medications  Medication Sig Dispense  Refill   AMBULATORY NON FORMULARY MEDICATION Medication Name:hydraulic patient lift. Please fax to [facility] 1 each 0   AMBULATORY NON FORMULARY MEDICATION Shower chair. Use as needed. Impaired mobility. R29.6 1 each 0   azelaic acid (AZELEX) 20 % cream APPLY TOPICALL 2 TIMES A DAY (MORNING AND EVENING). APPLY AFTER SKIN IS WASHED AND PATTED DRY. 30 g 6   butalbital-acetaminophen-caffeine (FIORICET, ESGIC) 50-325-40 MG tablet Take 1 tablet by mouth 2 (two) times daily as needed. (Patient taking differently: Take 1 tablet by mouth 2 (two) times daily as needed for migraine. ) 14 tablet 5   diclofenac sodium (VOLTAREN) 1 % GEL Apply 2 g topically 4 (four) times daily. To affected joint. 100 g 11   DULoxetine (CYMBALTA) 30 MG capsule Take 1 capsule (30 mg total) by mouth daily. 10 capsule 3   FLUoxetine (PROZAC) 20 MG tablet 1/2 tab po QD x 10 days then increase to whole tab daily 30 tablet 1   folic acid (FOLVITE) 1 MG  tablet TAKE 1 TABLET BY MOUTH EVERY DAY (Patient taking differently: Take 1 mg by mouth daily. ) 90 tablet 2   furosemide (LASIX) 20 MG tablet TAKE 1 TABLET BY MOUTH EVERY DAY AS NEEDED (Patient taking differently: Take 20 mg by mouth daily as needed for fluid. ) 90 tablet 1   gabapentin (NEURONTIN) 300 MG capsule One tab PO qHS for a week, then BID for a week, then TID. May double weekly to a max of 3,600mg /day 180 capsule 3   hydroxychloroquine (PLAQUENIL) 200 MG tablet 2 TABLET WITH FOOD OR MILK ONCE A DAY ORALLY 180 tablet 1   levothyroxine (SYNTHROID, LEVOTHROID) 25 MCG tablet Take 1 tablet (25 mcg total) by mouth daily. 90 tablet 3   losartan (COZAAR) 50 MG tablet Take 1 tablet (50 mg total) by mouth daily. 90 tablet 1   meloxicam (MOBIC) 15 MG tablet TAKE ONE TABLET BY MOUTH EACH AM WITH BREAKFAST AS NEEDED FOR PAIN 30 tablet 2   metFORMIN (GLUCOPHAGE) 500 MG tablet TAKE 1 TABLET BY MOUTH 2 TIMES DAILY WITH A MEAL. 180 tablet 2   Multiple Vitamin (MULTIVITAMIN) tablet  Take 1 tablet by mouth daily.     MYRBETRIQ 25 MG TB24 tablet TAKE 1 TABLET BY MOUTH EVERYDAY AT BEDTIME 90 tablet 1   nystatin (MYCOSTATIN/NYSTOP) powder APPLY 1 GRAM TOPICALLY 2 (TWO) TIMES DAILY. X 3 WEEKS. 60 g 1   Omega-3 Fatty Acids (FISH OIL) 1200 MG CAPS Take 1,200 mg by mouth 2 (two) times a week. WITH OMEGA-3 360 MG     oxyCODONE-acetaminophen (PERCOCET) 10-325 MG tablet Take 1 tablet by mouth every 8 (eight) hours as needed for pain. 90 tablet 0   potassium chloride (K-DUR) 10 MEQ tablet TAKE 1 TABLET (10 MEQ TOTAL) BY MOUTH 2 (TWO) TIMES DAILY. 60 tablet 11   rosuvastatin (CRESTOR) 10 MG tablet Take 1 tablet (10 mg total) by mouth at bedtime. 90 tablet 3   senna (SENOKOT) 8.6 MG tablet Take 1 tablet by mouth at bedtime.      TURMERIC PO Take 1,000 mg by mouth daily.     vitamin B-12 (CYANOCOBALAMIN) 100 MCG tablet Take 100 mcg by mouth at bedtime.      Vitamin D, Ergocalciferol, (DRISDOL) 1.25 MG (50000 UT) CAPS capsule Take 1 capsule (50,000 Units total) by mouth every Sunday. 4 capsule 8   No current facility-administered medications for this visit.    Allergies  Allergen Reactions   Iodine Other (See Comments)    Shook violently and passed out.   Codeine Other (See Comments)    Hallucinations   Lipitor [Atorvastatin] Other (See Comments)    Myalgias      Discussed warning signs or symptoms. Please see discharge instructions. Patient expresses understanding.

## 2018-11-07 NOTE — Patient Instructions (Signed)
Thank you for coming in today.  You should hear about ortho surgery referral soon.  If they want to continue conservative management I can do that.  If they want to do surgery we can do some of the post op care here if needed. Pt can be done here too.   They should have the images.   Keep me updated.  If healing is the plan likely recheck in 2 weeks after ortho visit.   Let me know if you needrefi

## 2018-11-08 ENCOUNTER — Encounter: Payer: Self-pay | Admitting: Orthopaedic Surgery

## 2018-11-08 ENCOUNTER — Ambulatory Visit (INDEPENDENT_AMBULATORY_CARE_PROVIDER_SITE_OTHER): Payer: Medicare Other | Admitting: Orthopaedic Surgery

## 2018-11-08 VITALS — Ht 62.0 in | Wt 208.0 lb

## 2018-11-08 DIAGNOSIS — S42291D Other displaced fracture of upper end of right humerus, subsequent encounter for fracture with routine healing: Secondary | ICD-10-CM | POA: Diagnosis not present

## 2018-11-08 NOTE — Progress Notes (Signed)
Office Visit Note   Patient: Joann Maddox           Date of Birth: Jul 08, 1940           MRN: [id number] Visit Date: 11/08/2018              Requested by: Rodolph Bong, MD [address] [place],  [place] [address] PCP: Agapito Games, MD   Assessment & Plan: Visit Diagnoses:  1. Other closed displaced fracture of proximal end of right humerus with routine healing, subsequent encounter     Plan: Patient's fracture is clinically healed.  Radiographs in June showed low riding head and some changes on CT scan showed rotator cuff muscle atrophy without significant fatty displacement of the muscle.  She can discontinue the sling gradually work on range of motion use of pulley do some finger wall walking and I will check her back again in 1 month.  I discussed with her at this point operative intervention is not recommended for her right shoulder.  Radiographs and CT scan were reviewed with patient and pathophysiology discussed in detail.  Follow-Up Instructions: Return in about 1 month (around 12/09/2018).   Orders:  No orders of the defined types were placed in this encounter.  No orders of the defined types were placed in this encounter.     Procedures: No procedures performed   Clinical Data: No additional findings.   Subjective: Chief Complaint  Patient presents with   Right Shoulder - Fracture    HPI 78 year old female seen with a history of a fall with acute right shoulder pain and x-rays demonstrating comminuted impacted proximal humerus fracture without significant angulation on 09/22/2018.  She was treated conservatively and followed by Dr. Denyse Amass and treated with a sling.  Follow-up CT scan of the shoulder 11/04/2018 showed extensive comminution involving the articular surface.  She has had some stiffness of her arm can get her hand to the top of her head and can use her hand in front of her without significant pain.  She has been concerned she may  fall has short stride gait and ambulates with a cane.  Previous x-rays that showed significant osteoarthritis of the left shoulder.  Previous two-level cervical fusion 01/03/2018 doing well.  Review of Systems 14 point system update noncontributory to HPI.  She has type 2 diabetes without plantar foot issues.  Osteoarthritis left shoulder.  History of hypertension, hypothyroidism hyperlipidemia fatigue and decreased ambulation.  History of fall with proximal humerus fracture on the right as listed above.   Objective: Vital Signs: Ht 5\' 2"  (1.575 m)    Wt 208 lb (94.3 kg)    BMI 38.04 kg/m   Physical Exam Constitutional:      Appearance: She is well-developed.  HENT:     Head: Normocephalic.     Right Ear: External ear normal.     Left Ear: External ear normal.  Eyes:     Pupils: Pupils are equal, round, and reactive to light.  Neck:     Thyroid: No thyromegaly.     Trachea: No tracheal deviation.  Cardiovascular:     Rate and Rhythm: Normal rate.  Pulmonary:     Effort: Pulmonary effort is normal.  Abdominal:     Palpations: Abdomen is soft.  Skin:    General: Skin is warm and dry.  Neurological:     Mental Status: She is alert and oriented to person, place, and time.  Psychiatric:  Behavior: Behavior normal.     Ortho Exam proximal and distal humerus move as 1 piece.  She can reach her hand to the top of her head.  Passively I can get her arm up overhead.  She has some weakness of rotator cuff muscles as expected with immobilization.  Sensation the hand is intact.  Axillary sensory over the deltoid is intact median ulnar sensation intact.  Specialty Comments:  No specialty comments available.  Imaging: CLINICAL DATA:  Persistent right shoulder pain and tenderness since a proximal humeral fracture in May 2020.  EXAM: CT OF THE UPPER RIGHT EXTREMITY WITHOUT CONTRAST  TECHNIQUE: Multidetector CT imaging of the upper right extremity was performed according to the  standard protocol.  COMPARISON:  Radiographs dated 10/11/2018, 09/26/2018 and 09/22/2018  FINDINGS: Bones/Joint/Cartilage  There is a severely comminuted impacted fracture of the right humeral head. The fracture involves the articular surface of the humeral head at multiple sites.  There appear to be some areas of bone union. There multiple tiny bone fragments within the glenohumeral joint. Glenohumeral joint effusion.  Slight osteophyte formation on the glenoid. Scapula is intact.  Moderate AC joint arthropathy. Type 1 acromion.  Muscles and Tendons  There is no atrophy of the muscles of the rotator cuff.  Soft tissues  Negative.  IMPRESSION: 1. Severely comminuted impacted fracture of the right humeral head. There has been partial healing of the fractures. The fracture involves the articular surface of the humeral head at multiple sites. 2. Glenohumeral joint effusion.   Electronically Signed   By: Lorriane Shire M.D.   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[facility]) 02/12/2015   Type 2 diabetes mellitus without complication ([facility]) 40/98/1191   Chronic constipation 02/19/2014   Rosacea 11/06/2013   GAD (generalized anxiety disorder) 06/08/2013   Essential hypertension, benign 05/04/2013   Migraine with aura  05/04/2013   Palpitations 05/04/2013   Murmur, heart 05/04/2013   Hyperlipidemia 05/04/2013   Hypothyroidism 05/04/2013   Fatigue 02/16/2012   Obesity, morbid ([facility]) 02/16/2012   Urge incontinence 02/16/2012   Hyperglycemia 01/12/2012   Past Medical History:  Diagnosis Date   Anxiety    Arthritis    Depression    Headache(784.0)    migrains   Heart murmur    Hypothyroidism    Pre-diabetes    Rheumatoid arthritis(714.0)     Family History  Problem Relation Age of Onset   Heart disease Father 50   Diabetes Other        aunt    Stroke Mother 58    Past Surgical History:  Procedure Laterality Date   ABDOMINAL HYSTERECTOMY     ANTERIOR CERVICAL DECOMP/DISCECTOMY FUSION N/A 01/03/2018   Procedure: ANTERIOR CERVICAL DECOMPRESSION/DISCECTOMY FUSION, ALLOGRAFT, PLATE;  Surgeon: Marybelle Killings, MD;  Location: [facility];  Service: Orthopedics;  Laterality: N/A;   APPENDECTOMY     CHOLECYSTECTOMY  REPLACEMENT TOTAL KNEE BILATERAL     TONSILLECTOMY     as  a child   TOTAL HIP ARTHROPLASTY     right and left   Social History   Occupational History   Occupation: Retired Runner, broadcasting/film/video    Comment: Corporate treasurer  Tobacco Use   Smoking status: Never Smoker   Smokeless tobacco: Never Used  Substance and Sexual Activity   Alcohol use: No   Drug use: Not on file   Sexual activity: Not on file

## 2018-11-10 ENCOUNTER — Ambulatory Visit: Payer: Medicare Other | Admitting: Family Medicine

## 2018-11-16 ENCOUNTER — Other Ambulatory Visit: Payer: Self-pay | Admitting: Family Medicine

## 2018-11-16 ENCOUNTER — Other Ambulatory Visit: Payer: Self-pay | Admitting: Sports Medicine

## 2018-11-23 ENCOUNTER — Other Ambulatory Visit: Payer: Self-pay | Admitting: Family Medicine

## 2018-11-25 ENCOUNTER — Telehealth: Payer: Self-pay | Admitting: Family Medicine

## 2018-11-25 NOTE — Telephone Encounter (Signed)
Mrs. Joann Maddox wanted a call back about here medications. Her issue pertains with pain management.

## 2018-11-25 NOTE — Telephone Encounter (Signed)
lvm advising pt to rtn call w/her questions or concerns. Advised that Dr. Madilyn Fireman will be out of the office next week and that if needed another provider may be able to address her concern.Joann Maddox, Lahoma Crocker, CMA

## 2018-12-05 ENCOUNTER — Encounter: Payer: Self-pay | Admitting: Family Medicine

## 2018-12-05 ENCOUNTER — Ambulatory Visit (INDEPENDENT_AMBULATORY_CARE_PROVIDER_SITE_OTHER): Payer: Medicare Other | Admitting: Family Medicine

## 2018-12-05 ENCOUNTER — Other Ambulatory Visit: Payer: Self-pay

## 2018-12-05 VITALS — BP 136/49 | HR 77 | Ht 62.0 in | Wt 203.0 lb

## 2018-12-05 DIAGNOSIS — E119 Type 2 diabetes mellitus without complications: Secondary | ICD-10-CM

## 2018-12-05 DIAGNOSIS — S42291D Other displaced fracture of upper end of right humerus, subsequent encounter for fracture with routine healing: Secondary | ICD-10-CM

## 2018-12-05 DIAGNOSIS — I890 Lymphedema, not elsewhere classified: Secondary | ICD-10-CM | POA: Diagnosis not present

## 2018-12-05 DIAGNOSIS — F320 Major depressive disorder, single episode, mild: Secondary | ICD-10-CM

## 2018-12-05 DIAGNOSIS — G479 Sleep disorder, unspecified: Secondary | ICD-10-CM

## 2018-12-05 LAB — POCT GLYCOSYLATED HEMOGLOBIN (HGB A1C): Hemoglobin A1C: 5.3 % (ref 4.0–5.6)

## 2018-12-05 MED ORDER — NYSTATIN 100000 UNIT/GM EX POWD: CUTANEOUS | 1 refills | Status: DC

## 2018-12-05 MED ORDER — AZELEX 20 % EX CREA: TOPICAL_CREAM | CUTANEOUS | 6 refills | Status: DC

## 2018-12-05 NOTE — Assessment & Plan Note (Signed)
We discussed moving her bedtime back slowly.  For example setting bedtime to 2 AM for 3 to 4 weeks and then moving to 1:30 AM for 3 to 4 weeks and eventually progressing to her preferred bedtime.

## 2018-12-05 NOTE — Assessment & Plan Note (Signed)
Has follow-up with Dr. Georgina Snell later this week.  Imaging shows some progress with healing.  We discussed maybe a trial of Voltaren gel she has some at home but has not tried it yet.

## 2018-12-05 NOTE — Progress Notes (Signed)
Established Patient Office Visit  Subjective:  Patient ID: Joann Maddox, female    DOB: 01-10-1941  Age: 79 y.o. MRN: [id number]  CC:  Chief Complaint  Patient presents with  . mood    HPI Joann Maddox presents for follow-up major depressive disorder-her brother passed away this year and has been a big struggle for her.  When she was last here we discussed decreasing her Cymbalta and switching her to fluoxetine specifically for mood.  She said she became nervous about the medication change and just decided to stick with the Cymbalta.  She actually feels like overall her mood is a little better than it was previously.  We have also discussed referral for therapy and counseling.  In regards to her shoulder pain she still in a lot of pain she did go see Dr. Kevan Ny, the orthopedist.  He felt like it was showing some progress in healing but felt like it may take a little longer.  He told her she did not have to wear her shoulder splint.  But she has put it on a couple times just for comfort.  She is currently on oxycodone twice a day as prescribed by Dr. Denyse Amass.  She does have a follow-up with him on Thursday and wants to consider switching back to tramadol she feels like the oxycodone causes her to feel excessively sedated and is causing some dry mouth.  But she says it does help her pain.  She still struggling with sleep.  She staying up until 3 or 4 in the morning to read and then sleeps half the day.  She says she just really loves to read and will sometimes get caught up in a book and just has a hard time putting it down and will read until the early morning hours but then she sleeps a good part of the day and then feels groggy.  She also feels like the oxycodone is making her little bit more groggy.  Past Medical History:  Diagnosis Date  . Anxiety   . Arthritis   . Depression   . Headache(784.0)    migrains  . Heart murmur   . Hypothyroidism   . Pre-diabetes   . Rheumatoid  arthritis(714.0)     Past Surgical History:  Procedure Laterality Date  . ABDOMINAL HYSTERECTOMY    . ANTERIOR CERVICAL DECOMP/DISCECTOMY FUSION N/A 01/03/2018   Procedure: ANTERIOR CERVICAL DECOMPRESSION/DISCECTOMY FUSION, ALLOGRAFT, PLATE;  Surgeon: Eldred Manges, MD;  Location: [facility];  Service: Orthopedics;  Laterality: N/A;  . APPENDECTOMY    . CHOLECYSTECTOMY    . REPLACEMENT TOTAL KNEE BILATERAL    . TONSILLECTOMY     as  a child  . TOTAL HIP ARTHROPLASTY     right and left    Family History  Problem Relation Age of Onset  . Heart disease Father 20  . Diabetes Other        aunt   . Stroke Mother 17    Social History   Socioeconomic History  . Marital status: Married    Spouse name: Dorene Sorrow  . Number of children: 2  . Years of education: Not on file  . Highest education level: Not on file  Occupational History  . Occupation: Retired Runner, broadcasting/film/video    Comment: Corporate treasurer  Social Needs  . Financial resource strain: Not on file  . Food insecurity    Worry: Not on file    Inability: Not on file  . Transportation needs  Medical: Not on file    Non-medical: Not on file  Tobacco Use  . Smoking status: Never Smoker  . Smokeless tobacco: Never Used  Substance and Sexual Activity  . Alcohol use: No  . Drug use: Not on file  . Sexual activity: Not on file  Lifestyle  . Physical activity    Days per week: Not on file    Minutes per session: Not on file  . Stress: Not on file  Relationships  . Social Herbalist on phone: Not on file    Gets together: Not on file    Attends religious service: Not on file    Active member of club or organization: Not on file    Attends meetings of clubs or organizations: Not on file    Relationship status: Not on file  . Intimate partner violence    Fear of current or ex partner: Not on file    Emotionally abused: Not on file    Physically abused: Not on file    Forced sexual activity: Not on file  Other Topics  Concern  . Not on file  Social History Narrative   No regular exercise. 2 caffeine drinks per day.     Outpatient Medications Prior to Visit  Medication Sig Dispense Refill  . butalbital-acetaminophen-caffeine (FIORICET, ESGIC) 50-325-40 MG tablet Take 1 tablet by mouth 2 (two) times daily as needed. (Patient taking differently: Take 1 tablet by mouth 2 (two) times daily as needed for migraine. ) 14 tablet 5  . diclofenac sodium (VOLTAREN) 1 % GEL Apply 2 g topically 4 (four) times daily. To affected joint. 100 g 11  . DULoxetine (CYMBALTA) 30 MG capsule Take 1 capsule (30 mg total) by mouth daily. 10 capsule 3  . folic acid (FOLVITE) 1 MG tablet TAKE 1 TABLET BY MOUTH EVERY DAY (Patient taking differently: Take 1 mg by mouth daily. ) 90 tablet 2  . furosemide (LASIX) 20 MG tablet TAKE 1 TABLET BY MOUTH EVERY DAY AS NEEDED (Patient taking differently: Take 20 mg by mouth daily as needed for fluid. ) 90 tablet 1  . gabapentin (NEURONTIN) 300 MG capsule One tab PO qHS for a week, then BID for a week, then TID. May double weekly to a max of 3,600mg /day 180 capsule 3  . hydroxychloroquine (PLAQUENIL) 200 MG tablet 2 TABLET WITH FOOD OR MILK ONCE A DAY ORALLY 180 tablet 1  . levothyroxine (SYNTHROID, LEVOTHROID) 25 MCG tablet Take 1 tablet (25 mcg total) by mouth daily. 90 tablet 3  . losartan (COZAAR) 25 MG tablet TAKE 2 TABLETS BY MOUTH EVERY DAY 60 tablet 0  . meloxicam (MOBIC) 15 MG tablet TAKE ONE TABLET BY MOUTH EACH AM WITH BREAKFAST AS NEEDED FOR PAIN 30 tablet 2  . metFORMIN (GLUCOPHAGE) 500 MG tablet TAKE 1 TABLET BY MOUTH 2 TIMES DAILY WITH A MEAL. 180 tablet 2  . Multiple Vitamin (MULTIVITAMIN) tablet Take 1 tablet by mouth daily.    Marland Kitchen MYRBETRIQ 25 MG TB24 tablet TAKE 1 TABLET BY MOUTH EVERYDAY AT BEDTIME 90 tablet 1  . Omega-3 Fatty Acids (FISH OIL) 1200 MG CAPS Take 1,200 mg by mouth 2 (two) times a week. WITH OMEGA-3 360 MG    . oxyCODONE-acetaminophen (PERCOCET) 10-325 MG tablet Take  1 tablet by mouth every 8 (eight) hours as needed for pain. 90 tablet 0  . potassium chloride (K-DUR) 10 MEQ tablet TAKE 1 TABLET (10 MEQ TOTAL) BY MOUTH 2 (TWO) TIMES DAILY.  60 tablet 11  . rosuvastatin (CRESTOR) 10 MG tablet Take 1 tablet (10 mg total) by mouth at bedtime. 90 tablet 3  . senna (SENOKOT) 8.6 MG tablet Take 1 tablet by mouth at bedtime.     . TURMERIC PO Take 1,000 mg by mouth daily.    . vitamin B-12 (CYANOCOBALAMIN) 100 MCG tablet Take 100 mcg by mouth at bedtime.     . Vitamin D, Ergocalciferol, (DRISDOL) 1.25 MG (50000 UT) CAPS capsule Take 1 capsule (50,000 Units total) by mouth every Sunday. 4 capsule 8  . AMBULATORY NON FORMULARY MEDICATION Medication Name:hydraulic patient lift. Please fax to [facility] 1 each 0  . AMBULATORY NON FORMULARY MEDICATION Shower chair. Use as needed. Impaired mobility. R29.6 1 each 0  . azelaic acid (AZELEX) 20 % cream APPLY TOPICALL 2 TIMES A DAY (MORNING AND EVENING). APPLY AFTER SKIN IS WASHED AND PATTED DRY. 30 g 6  . Fluoxetine HCl, PMDD, 20 MG TABS TAKE 1/2 TABLET BY MOUTH ONCE DAILY FOR 10 DAYS, THEN INCREASE TO WHOLE TAB DAILY 90 tablet 1  . nystatin (MYCOSTATIN/NYSTOP) powder APPLY 1 GRAM TOPICALLY 2 (TWO) TIMES DAILY. X 3 WEEKS. 60 g 1   No facility-administered medications prior to visit.     Allergies  Allergen Reactions  . Iodine Other (See Comments)    Shook violently and passed out.  . Codeine Other (See Comments)    Hallucinations  . Lipitor [Atorvastatin] Other (See Comments)    Myalgias    ROS Review of Systems    Objective:    Physical Exam  Constitutional: She is oriented to person, place, and time. She appears well-developed and well-nourished.  HENT:  Head: Normocephalic and atraumatic.  Cardiovascular: Normal rate, regular rhythm and normal heart sounds.  Pulmonary/Chest: Effort normal and breath sounds normal.  Neurological: She is alert and oriented to person, place, and time.  Skin: Skin is warm and  dry.  Psychiatric: She has a normal mood and affect. Her behavior is normal.    BP (!) 136/49   Pulse 77   Ht 5\' 2"  (1.575 m)   Wt 203 lb (92.1 kg)   SpO2 96%   BMI 37.13 kg/m  Wt Readings from Last 3 Encounters:  12/05/18 203 lb (92.1 kg)  11/08/18 208 lb (94.3 kg)  11/07/18 208 lb (94.3 kg)     Health Maintenance Due  Topic Date Due  . OPHTHALMOLOGY EXAM  06/25/1952  . INFLUENZA VACCINE  11/26/2018    There are no preventive care reminders to display for this patient.  Lab Results  Component Value Date   TSH 0.48 08/22/2018   Lab Results  Component Value Date   WBC 6.3 08/22/2018   HGB 11.0 (L) 08/22/2018   HCT 33.3 (L) 08/22/2018   MCV 86.7 08/22/2018   PLT 264 08/22/2018   Lab Results  Component Value Date   NA 142 08/22/2018   K 4.3 08/22/2018   CO2 30 08/22/2018   GLUCOSE 105 (H) 08/22/2018   BUN 21 08/22/2018   CREATININE 0.67 08/22/2018   BILITOT 0.5 08/22/2018   ALKPHOS 66 12/23/2017   AST 22 08/22/2018   ALT 15 08/22/2018   PROT 6.3 08/22/2018   ALBUMIN 3.7 12/23/2017   CALCIUM 9.7 08/22/2018   ANIONGAP 10 01/04/2018   Lab Results  Component Value Date   CHOL 128 12/03/2017   Lab Results  Component Value Date   HDL 77 12/03/2017   Lab Results  Component Value Date  LDLCALC 35 12/03/2017   Lab Results  Component Value Date   TRIG 75 12/03/2017   Lab Results  Component Value Date   CHOLHDL 1.7 12/03/2017   Lab Results  Component Value Date   HGBA1C 5.3 12/05/2018      Assessment & Plan:   Problem List Items Addressed This Visit      Musculoskeletal and Integument   Closed fracture of right proximal humerus    Has follow-up with Dr. Denyse Amass later this week.  Imaging shows some progress with healing.  We discussed maybe a trial of Voltaren gel she has some at home but has not tried it yet.        Other   Sleep disturbance    We discussed moving her bedtime back slowly.  For example setting bedtime to 2 AM for 3 to 4  weeks and then moving to 1:30 AM for 3 to 4 weeks and eventually progressing to her preferred bedtime.      Lymph edema    Continue to wear compression stockings and can always refer to lymphedema clinic if needed.      Depression, major, single episode, mild ([facility]) - Primary    She decided for now she does not want to switch to the fluoxetine she rather just take with the Cymbalta.  She feels like overall her mood has improved she feels like she can at least see a potential hopeful future for herself even though she is in pain and is frustrated by her chronic medical conditions whereas before she was feeling more hopeless.  So for now we will continue to monitor and make changes as needed.       Other Visit Diagnoses    Type 2 diabetes mellitus without complication, without long-term current use of insulin ([facility])       Relevant Orders   POCT HgB A1C (Completed)      Meds ordered this encounter  Medications  . azelaic acid (AZELEX) 20 % cream    Sig: APPLY TOPICALL 2 TIMES A DAY (MORNING AND EVENING). APPLY AFTER SKIN IS WASHED AND PATTED DRY.    Dispense:  30 g    Refill:  6  . nystatin (MYCOSTATIN/NYSTOP) powder    Sig: APPLY 1 GRAM TOPICALLY 2 (TWO) TIMES DAILY. X 3 WEEKS.    Dispense:  60 g    Refill:  1    Follow-up: Return in about 4 months (around 04/06/2019) for DM, mood.    Nani Gasser, MD

## 2018-12-06 ENCOUNTER — Encounter: Payer: Self-pay | Admitting: Family Medicine

## 2018-12-06 DIAGNOSIS — I890 Lymphedema, not elsewhere classified: Secondary | ICD-10-CM | POA: Insufficient documentation

## 2018-12-06 NOTE — Assessment & Plan Note (Signed)
Continue to wear compression stockings and can always refer to lymphedema clinic if needed.

## 2018-12-06 NOTE — Assessment & Plan Note (Signed)
She decided for now she does not want to switch to the fluoxetine she rather just take with the Cymbalta.  She feels like overall her mood has improved she feels like she can at least see a potential hopeful future for herself even though she is in pain and is frustrated by her chronic medical conditions whereas before she was feeling more hopeless.  So for now we will continue to monitor and make changes as needed.

## 2018-12-08 ENCOUNTER — Ambulatory Visit (INDEPENDENT_AMBULATORY_CARE_PROVIDER_SITE_OTHER): Payer: Medicare Other | Admitting: Family Medicine

## 2018-12-08 ENCOUNTER — Encounter: Payer: Self-pay | Admitting: Family Medicine

## 2018-12-08 ENCOUNTER — Other Ambulatory Visit: Payer: Self-pay

## 2018-12-08 VITALS — BP 133/78 | HR 80 | Temp 97.6°F | Wt 203.0 lb

## 2018-12-08 DIAGNOSIS — S42291D Other displaced fracture of upper end of right humerus, subsequent encounter for fracture with routine healing: Secondary | ICD-10-CM

## 2018-12-08 MED ORDER — TRAMADOL HCL 50 MG PO TABS: 100.0000 mg | ORAL_TABLET | Freq: Two times a day (BID) | ORAL | 0 refills | Status: DC

## 2018-12-08 NOTE — Patient Instructions (Signed)
Thank you for coming in today. Switch to tramadol.  If this is going to work we can go back to oxycodone.  Take 2 twice daily for pain.  Transition out of sling. Continue exercises.  Reasonable to follow up with Dr Lorin Mercy in a few days.  If conservative management is the plan I am happy to continue it.   Dr Paulla Dolly is a good podiatrist.  [facility]) [address] [place], [place] [address] [phone]

## 2018-12-08 NOTE — Progress Notes (Signed)
Joann Maddox is a 78 y.o. female who presents to [facility] today for follow-up proximal humerus fracture.  Patient suffered a right proximal humerus fracture on May 26.  She had trial of conservative management until July.  She did not have much healing visible on plain x-ray just having quite a bit of pain.  CT scan showed some early healing.  She is referred to orthopedic surgery Dr. Kevan Ny for effectively second opinion and surgery evaluation.  After visit elected for nonsurgical management.  In the interim she has been working on range of motion of her shoulder with physical therapy through home health as well as home exercise program.  She notes this is helped a bit.  She continues to experience quite a bit of pain and notes that the sling does provide pain relief.  She has follow-up appoint with Dr. Ophelia Charter scheduled in a few days.  Additionally she has been receiving pain management as well during this.  She previously was taking tramadol 50 total of 120 tablets/month.  Given her increased pain needs he was transition to oxycodone.  She notes that she continues to have pain but would like to try switching back to tramadol.  She has vascular nature quite fatigued.  ROS:  As above  Exam:  BP 133/78   Pulse 80   Temp 97.6 F (36.4 C) (Oral)   Wt 203 lb (92.1 kg)   BMI 37.13 kg/m  Wt Readings from Last 5 Encounters:  12/08/18 203 lb (92.1 kg)  12/05/18 203 lb (92.1 kg)  11/08/18 208 lb (94.3 kg)  11/07/18 208 lb (94.3 kg)  10/31/18 210 lb (95.3 kg)   General: Well Developed, well nourished, and in no acute distress.  Neuro/Psych: Alert and oriented x3, extra-ocular muscles intact, able to move all 4 extremities, sensation grossly intact. Skin: Warm and dry, no rashes noted.  Respiratory: Not using accessory muscles, speaking in full sentences, trachea midline.  Cardiovascular: Pulses palpable, no extremity edema. Abdomen: Does not appear  distended. MSK: Right shoulder: Normal-appearing mildly tender to palpation decreased range of motion.       Assessment and Plan: 78 y.o. female with right shoulder proximal humerus fracture.  Fracture remains not significant change on most recent imaging.  Patient clinically is slightly improved.  Patient has follow-up appoint with Dr. Ophelia Charter next week.  I think is likely that she will receive x-rays regardless at orthopedics and after discussion plan to hold off on x-rays today.  Would like to avoid duplicating work.  Happy to continue providing conservative management care if plan is to continue physical therapy and home exercise program.  Will check back in 1 month.  Additionally patient continues have chronic pain needs from her right shoulder as well as her right of other orthopedic conditions.  Plan to transition back to previous tramadol dose.  Will use tramadol 50 mg number 120/month.  Discontinue oxycodone.   PDMP reviewed during this encounter. No orders of the defined types were placed in this encounter.  No orders of the defined types were placed in this encounter.   Historical information moved to improve visibility of documentation.  Past Medical History:  Diagnosis Date  . Anxiety   . Arthritis   . Depression   . Headache(784.0)    migrains  . Heart murmur   . Hypothyroidism   . Pre-diabetes   . Rheumatoid arthritis(714.0)    Past Surgical History:  Procedure Laterality Date  . ABDOMINAL  HYSTERECTOMY    . ANTERIOR CERVICAL DECOMP/DISCECTOMY FUSION N/A 01/03/2018   Procedure: ANTERIOR CERVICAL DECOMPRESSION/DISCECTOMY FUSION, ALLOGRAFT, PLATE;  Surgeon: Eldred Manges, MD;  Location: [facility] OR;  Service: Orthopedics;  Laterality: N/A;  . APPENDECTOMY    . CHOLECYSTECTOMY    . REPLACEMENT TOTAL KNEE BILATERAL    . TONSILLECTOMY     as  a child  . TOTAL HIP ARTHROPLASTY     right and left   Social History   Tobacco Use  . Smoking status: Never Smoker  .  Smokeless tobacco: Never Used  Substance Use Topics  . Alcohol use: No   family history includes Diabetes in an other family member; Heart disease (age of onset: 61) in her father; Stroke (age of onset: 37) in her mother.  Medications: Current Outpatient Medications  Medication Sig Dispense Refill  . azelaic acid (AZELEX) 20 % cream APPLY TOPICALL 2 TIMES A DAY (MORNING AND EVENING). APPLY AFTER SKIN IS WASHED AND PATTED DRY. 30 g 6  . butalbital-acetaminophen-caffeine (FIORICET, ESGIC) 50-325-40 MG tablet Take 1 tablet by mouth 2 (two) times daily as needed. (Patient taking differently: Take 1 tablet by mouth 2 (two) times daily as needed for migraine. ) 14 tablet 5  . diclofenac sodium (VOLTAREN) 1 % GEL Apply 2 g topically 4 (four) times daily. To affected joint. 100 g 11  . DULoxetine (CYMBALTA) 30 MG capsule Take 1 capsule (30 mg total) by mouth daily. 10 capsule 3  . folic acid (FOLVITE) 1 MG tablet TAKE 1 TABLET BY MOUTH EVERY DAY (Patient taking differently: Take 1 mg by mouth daily. ) 90 tablet 2  . furosemide (LASIX) 20 MG tablet TAKE 1 TABLET BY MOUTH EVERY DAY AS NEEDED (Patient taking differently: Take 20 mg by mouth daily as needed for fluid. ) 90 tablet 1  . gabapentin (NEURONTIN) 300 MG capsule One tab PO qHS for a week, then BID for a week, then TID. May double weekly to a max of 3,600mg /day 180 capsule 3  . hydroxychloroquine (PLAQUENIL) 200 MG tablet 2 TABLET WITH FOOD OR MILK ONCE A DAY ORALLY 180 tablet 1  . levothyroxine (SYNTHROID, LEVOTHROID) 25 MCG tablet Take 1 tablet (25 mcg total) by mouth daily. 90 tablet 3  . losartan (COZAAR) 25 MG tablet TAKE 2 TABLETS BY MOUTH EVERY DAY 60 tablet 0  . meloxicam (MOBIC) 15 MG tablet TAKE ONE TABLET BY MOUTH EACH AM WITH BREAKFAST AS NEEDED FOR PAIN 30 tablet 2  . metFORMIN (GLUCOPHAGE) 500 MG tablet TAKE 1 TABLET BY MOUTH 2 TIMES DAILY WITH A MEAL. 180 tablet 2  . Multiple Vitamin (MULTIVITAMIN) tablet Take 1 tablet by mouth  daily.    Marland Kitchen MYRBETRIQ 25 MG TB24 tablet TAKE 1 TABLET BY MOUTH EVERYDAY AT BEDTIME 90 tablet 1  . nystatin (MYCOSTATIN/NYSTOP) powder APPLY 1 GRAM TOPICALLY 2 (TWO) TIMES DAILY. X 3 WEEKS. 60 g 1  . Omega-3 Fatty Acids (FISH OIL) 1200 MG CAPS Take 1,200 mg by mouth 2 (two) times a week. WITH OMEGA-3 360 MG    . potassium chloride (K-DUR) 10 MEQ tablet TAKE 1 TABLET (10 MEQ TOTAL) BY MOUTH 2 (TWO) TIMES DAILY. 60 tablet 11  . rosuvastatin (CRESTOR) 10 MG tablet Take 1 tablet (10 mg total) by mouth at bedtime. 90 tablet 3  . senna (SENOKOT) 8.6 MG tablet Take 1 tablet by mouth at bedtime.     . TURMERIC PO Take 1,000 mg by mouth daily.    . vitamin  B-12 (CYANOCOBALAMIN) 100 MCG tablet Take 100 mcg by mouth at bedtime.     . Vitamin D, Ergocalciferol, (DRISDOL) 1.25 MG (50000 UT) CAPS capsule Take 1 capsule (50,000 Units total) by mouth every Sunday. 4 capsule 8   No current facility-administered medications for this visit.    Allergies  Allergen Reactions  . Iodine Other (See Comments)    Shook violently and passed out.  . Codeine Other (See Comments)    Hallucinations  . Lipitor [Atorvastatin] Other (See Comments)    Myalgias      Discussed warning signs or symptoms. Please see discharge instructions. Patient expresses understanding.

## 2018-12-13 ENCOUNTER — Ambulatory Visit: Payer: Medicare Other | Admitting: Orthopaedic Surgery

## 2018-12-14 ENCOUNTER — Other Ambulatory Visit: Payer: Self-pay | Admitting: Family Medicine

## 2018-12-20 ENCOUNTER — Ambulatory Visit: Payer: Self-pay

## 2018-12-20 ENCOUNTER — Encounter: Payer: Self-pay | Admitting: Orthopaedic Surgery

## 2018-12-20 ENCOUNTER — Ambulatory Visit (INDEPENDENT_AMBULATORY_CARE_PROVIDER_SITE_OTHER): Payer: Medicare Other | Admitting: Orthopaedic Surgery

## 2018-12-20 VITALS — BP 174/83 | HR 71 | Ht 61.0 in | Wt 203.0 lb

## 2018-12-20 DIAGNOSIS — S42291D Other displaced fracture of upper end of right humerus, subsequent encounter for fracture with routine healing: Secondary | ICD-10-CM | POA: Diagnosis not present

## 2018-12-20 NOTE — Progress Notes (Signed)
Office Visit Note   Patient: Joann Maddox           Date of Birth: [birth year]           MRN: [id number] Visit Date: 12/20/2018              Requested by: Hali Marry, [address] [place],  [place] [id number] PCP: Hali Marry, MD   Assessment & Plan: Visit Diagnoses:  1. Other closed displaced fracture of proximal end of right humerus with routine healing, subsequent encounter     Plan: Continue use of right shoulder with gradual progressive activities.  She can return in 3 months.  She would like to get her right shoulder better for she possibly considers left total shoulder arthroplasty.  Follow-Up Instructions: Return in about 3 months (around 03/22/2019).   Orders:  Orders Placed This Encounter  Procedures  . XR Shoulder Right   No orders of the defined types were placed in this encounter.     Procedures: No procedures performed   Clinical Data: No additional findings.   Subjective: Chief Complaint  Patient presents with  . Right Shoulder - Fracture, Follow-up    DOI 09/22/2018    HPI 78 year old female returns for follow-up of right proximal humerus fracture date of injury 09/22/2018.  She has backed off the oxycodone is now switch back to tramadol.  She has left shoulder osteoarthritis and we had discussed possible total shoulder arthroplasty on the left prior to her fall and injury to the right shoulder.  She is happy with the right total hip arthroplasty also cervical fusion previously done.  She states the shoulder bothers her with range of motion outstretch reaching but she is noticed gradually week to week some improved function in the shoulder and some decreasing pain.  Review of Systems updated previous office note unchanged.  Of note is type 2 diabetes.  Previous surgeries as mentioned above hyperlipidemia hypothyroidism.  Left shoulder OA.  Previous right total up arthroplasty.  Two-level cervical fusion.    Objective: Vital Signs: BP (!) 174/83   Pulse 71   Ht 5\' 1"  (1.549 m)   Wt 203 lb (92.1 kg)   BMI 38.36 kg/m   Physical Exam Constitutional:      Appearance: She is well-developed.  HENT:     Head: Normocephalic.     Right Ear: External ear normal.     Left Ear: External ear normal.  Eyes:     Pupils: Pupils are equal, round, and reactive to light.  Neck:     Thyroid: No thyromegaly.     Trachea: No tracheal deviation.  Cardiovascular:     Rate and Rhythm: Normal rate.  Pulmonary:     Effort: Pulmonary effort is normal.  Abdominal:     Palpations: Abdomen is soft.  Skin:    General: Skin is warm and dry.  Neurological:     Mental Status: She is alert and oriented to person, place, and time.  Psychiatric:        Behavior: Behavior normal.     Ortho Exam proximal and distal humerus moves with 1 piece.  She can get her arm up overhead.  Still has some rotator cuff weakness but is improved from last month exam.  Sensation hand is intact.  She is amatory with a single-point cane.  Specialty Comments:  No specialty comments available.  Imaging: No results found.   PMFS History: Patient Active Problem List  Diagnosis Date Noted  . Lymph edema 12/06/2018  . Sleep disturbance 12/05/2018  . Closed fracture of right proximal humerus 09/22/2018  . Bilateral lower extremity edema 09/20/2018  . Obesity (BMI 30-39.9) 09/02/2018  . Varicose veins of both lower extremities 08/22/2018  . Depression, major, single episode, mild (HCC) 08/22/2018  . Falls frequently 08/12/2018  . Mass of thigh, right 05/17/2018  . 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 Status post cervical spinal fusion 01/11/2018  . Neck pain 10/20/2017  . Chronic venous insufficiency 04/29/2017  . 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HCC) 02/12/2015  . Controlled diabetes mellitus type 2 with complications (HCC) 08/10/2014  .  Chronic constipation 02/19/2014  . Rosacea 11/06/2013  . GAD (generalized anxiety disorder) 06/08/2013  . Essential hypertension, benign 05/04/2013  . Migraine with aura 05/04/2013  . Palpitations 05/04/2013  . Murmur, heart 05/04/2013  . Hyperlipidemia 05/04/2013  . Hypothyroidism 05/04/2013  . Fatigue 02/16/2012  . Obesity, morbid (HCC) 02/16/2012  . Urge incontinence 02/16/2012  . Hyperglycemia 01/12/2012   Past Medical History:  Diagnosis Date  . Anxiety   . Arthritis   . Depression   . Headache(784.0)    migrains  . Heart murmur   . Hypothyroidism   . Pre-diabetes   . Rheumatoid arthritis(714.0)     Family History  Problem Relation Age of Onset  . Heart disease Father 44  . Diabetes Other        aunt   . Stroke Mother 63    Past Surgical History:  Procedure Laterality Date  . ABDOMINAL HYSTERECTOMY    . ANTERIOR CERVICAL DECOMP/DISCECTOMY FUSION N/A 01/03/2018   Procedure: ANTERIOR CERVICAL DECOMPRESSION/DISCECTOMY FUSION, ALLOGRAFT, PLATE;  Surgeon: Eldred Manges, MD;  Location: [facility] OR;  Service: Orthopedics;  Laterality: N/A;  . APPENDECTOMY    . CHOLECYSTECTOMY    . REPLACEMENT TOTAL KNEE BILATERAL    . TONSILLECTOMY     as  a child  . TOTAL HIP ARTHROPLASTY     right and left   Social History   Occupational History  . Occupation: Retired Runner, broadcasting/film/video    Comment: Corporate treasurer  Tobacco Use  . Smoking status: Never Smoker  . Smokeless tobacco: Never Used  Substance and Sexual Activity  . Alcohol use: No  . Drug use: Not on file  . Sexual activity: Not on file

## 2018-12-29 ENCOUNTER — Other Ambulatory Visit: Payer: Self-pay | Admitting: Family Medicine

## 2019-01-01 ENCOUNTER — Other Ambulatory Visit: Payer: Self-pay | Admitting: Family Medicine

## 2019-01-03 NOTE — Telephone Encounter (Signed)
Last OV was 08/10. Please advise. I am not able to send electronically.

## 2019-01-10 ENCOUNTER — Ambulatory Visit (INDEPENDENT_AMBULATORY_CARE_PROVIDER_SITE_OTHER): Payer: Medicare Other | Admitting: Family Medicine

## 2019-01-10 ENCOUNTER — Other Ambulatory Visit: Payer: Self-pay

## 2019-01-10 ENCOUNTER — Encounter: Payer: Self-pay | Admitting: Family Medicine

## 2019-01-10 ENCOUNTER — Telehealth: Payer: Self-pay | Admitting: Family Medicine

## 2019-01-10 ENCOUNTER — Other Ambulatory Visit: Payer: Self-pay | Admitting: Family Medicine

## 2019-01-10 VITALS — BP 162/58 | HR 81 | Temp 98.4°F | Ht 61.0 in | Wt 203.0 lb

## 2019-01-10 DIAGNOSIS — M25511 Pain in right shoulder: Secondary | ICD-10-CM

## 2019-01-10 DIAGNOSIS — M25512 Pain in left shoulder: Secondary | ICD-10-CM

## 2019-01-10 DIAGNOSIS — Z23 Encounter for immunization: Secondary | ICD-10-CM

## 2019-01-10 DIAGNOSIS — M542 Cervicalgia: Secondary | ICD-10-CM | POA: Diagnosis not present

## 2019-01-10 DIAGNOSIS — G479 Sleep disorder, unspecified: Secondary | ICD-10-CM

## 2019-01-10 DIAGNOSIS — F320 Major depressive disorder, single episode, mild: Secondary | ICD-10-CM

## 2019-01-10 DIAGNOSIS — G8929 Other chronic pain: Secondary | ICD-10-CM

## 2019-01-10 MED ORDER — FLUOXETINE HCL 20 MG PO TABS: ORAL_TABLET | ORAL | 0 refills | Status: DC

## 2019-01-10 MED ORDER — LOSARTAN POTASSIUM 100 MG PO TABS: 100.0000 mg | ORAL_TABLET | Freq: Every day | ORAL | 0 refills | Status: DC

## 2019-01-10 MED ORDER — DOXEPIN HCL 10 MG/ML PO CONC: 5.0000 mg | Freq: Every day | ORAL | 0 refills | Status: DC

## 2019-01-10 NOTE — Patient Instructions (Addendum)
Ok, decrease Cymbalta down to 30 mg once a day for 5 to 6 days and then discontinue and start the fluoxetine. I am going to switch the blood pressure to pill to 100 mg of losartan.  New prescription sent to your pharmacy.

## 2019-01-10 NOTE — Telephone Encounter (Signed)
Joann Maddox stopped by. She wanted to know if you heard back from Dr Gates(referred to orthopedic surgery for second opinion and surgery eval).  Thanks

## 2019-01-10 NOTE — Assessment & Plan Note (Signed)
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  So new prescription sent to the pharmacy.

## 2019-01-10 NOTE — Assessment & Plan Note (Signed)
We discussed changes in sleep schedule at the last appointment and strategies around getting to bed at a decent time as well as making sure that things are conducive to sleep.  At this point she has tried to set her bedtime for about 2 AM so that is great and is an improvement but she still having a lot of falling asleep during the day and just feeling really tired and waking up in 2-hour intervals.  We discussed a low-dose of doxepin to see if this is helpful.  Did warn about excess sedation when she first gets up in the morning.

## 2019-01-10 NOTE — Assessment & Plan Note (Signed)
Discussed options.  She is already on 60 mg of Cymbalta.  Rarely a 60 mg more effective especially in elderly patients so plan will be to switch her to fluoxetine instead.  We will need to decrease down the Cymbalta to 30 mg and taper off and then switch to fluoxetine.  Will need to follow-up in 3 to 4 weeks to make sure that she is doing well and so that her dose can be adjusted.  We also discussed therapy and counseling we had originally referred her back in April and she had an appointment in May but then canceled I think this could still be really helpful for her in the long-term.

## 2019-01-10 NOTE — Progress Notes (Signed)
Established Patient Office Visit  Subjective:  Patient ID: Joann Maddox, female    DOB: Apr 25, 1941  Age: 78 y.o. MRN: [id number]  CC:  Chief Complaint  Patient presents with  . mood    HPI Joann Maddox presents for follow-up major depressive disorder.  Last seen 4 weeks ago.  She is actually feeling better when I last saw her.  Her brother had passed away earlier this year.  Though, she was still struggling with poor sleep quality.  She was staying up until 3 or 4 in the morning and then sleeping half of the day.  She says she just loves to read at night and will get lost in a book.  We discussed some strategies around shifting her bedtime.    She feels like she has a lot of anxiety esp around COVID pandemic.  She feels like she has been having frequent headaches on the right side of her head.   She still seeing orthopedics for a closed fracture at the proximal end of the right humerus.  Following with Dr. Annell Greening.  Last appointment was about 2 weeks ago.  Unfortunately she has had some slow healing.  She still having a lot of bilateral shoulder pain.  Is now off oxycodone and just using tramadol as needed for pain.  More recently the right side of her neck is been really hurting.  It really affects both but it seems worse on the right.  She is been trying to use a neck pillow and it does seem to help some.  She denies any known injury or trauma.  But is now triggering more frequent headaches.  Past Medical History:  Diagnosis Date  . Anxiety   . Arthritis   . Depression   . Headache(784.0)    migrains  . Heart murmur   . Hypothyroidism   . Pre-diabetes   . Rheumatoid arthritis(714.0)     Past Surgical History:  Procedure Laterality Date  . ABDOMINAL HYSTERECTOMY    . ANTERIOR CERVICAL DECOMP/DISCECTOMY FUSION N/A 01/03/2018   Procedure: ANTERIOR CERVICAL DECOMPRESSION/DISCECTOMY FUSION, ALLOGRAFT, PLATE;  Surgeon: Eldred Manges, MD;  Location: [facility];  Service: Orthopedics;   Laterality: N/A;  . APPENDECTOMY    . CHOLECYSTECTOMY    . REPLACEMENT TOTAL KNEE BILATERAL    . TONSILLECTOMY     as  a child  . TOTAL HIP ARTHROPLASTY     right and left    Family History  Problem Relation Age of Onset  . Heart disease Father 61  . Diabetes Other        aunt   . Stroke Mother 94    Social History   Socioeconomic History  . Marital status: Married    Spouse name: Dorene Sorrow  . Number of children: 2  . Years of education: Not on file  . Highest education level: Not on file  Occupational History  . Occupation: Retired Runner, broadcasting/film/video    Comment: Corporate treasurer  Social Needs  . Financial resource strain: Not on file  . Food insecurity    Worry: Not on file    Inability: Not on file  . Transportation needs    Medical: Not on file    Non-medical: Not on file  Tobacco Use  . Smoking status: Never Smoker  . Smokeless tobacco: Never Used  Substance and Sexual Activity  . Alcohol use: No  . Drug use: Not on file  . Sexual activity: Not on file  Lifestyle  .  Physical activity    Days per week: Not on file    Minutes per session: Not on file  . Stress: Not on file  Relationships  . Social Musicianconnections    Talks on phone: Not on file    Gets together: Not on file    Attends religious service: Not on file    Active member of club or organization: Not on file    Attends meetings of clubs or organizations: Not on file    Relationship status: Not on file  . Intimate partner violence    Fear of current or ex partner: Not on file    Emotionally abused: Not on file    Physically abused: Not on file    Forced sexual activity: Not on file  Other Topics Concern  . Not on file  Social History Narrative   No regular exercise. 2 caffeine drinks per day.     Outpatient Medications Prior to Visit  Medication Sig Dispense Refill  . DULoxetine (CYMBALTA) 60 MG capsule Take 1 capsule by mouth daily.    Marland Kitchen. azelaic acid (AZELEX) 20 % cream APPLY TOPICALL 2 TIMES A DAY  (MORNING AND EVENING). APPLY AFTER SKIN IS WASHED AND PATTED DRY. 30 g 6  . butalbital-acetaminophen-caffeine (FIORICET, ESGIC) 50-325-40 MG tablet Take 1 tablet by mouth 2 (two) times daily as needed. (Patient taking differently: Take 1 tablet by mouth 2 (two) times daily as needed for migraine. ) 14 tablet 5  . diclofenac sodium (VOLTAREN) 1 % GEL Apply 2 g topically 4 (four) times daily. To affected joint. 100 g 11  . folic acid (FOLVITE) 1 MG tablet TAKE 1 TABLET BY MOUTH EVERY DAY (Patient taking differently: Take 1 mg by mouth daily. ) 90 tablet 2  . furosemide (LASIX) 20 MG tablet TAKE 1 TABLET BY MOUTH EVERY DAY AS NEEDED (Patient taking differently: Take 20 mg by mouth daily as needed for fluid. ) 90 tablet 1  . gabapentin (NEURONTIN) 300 MG capsule One tab PO qHS for a week, then BID for a week, then TID. May double weekly to a max of 3,600mg /day 180 capsule 3  . levothyroxine (SYNTHROID, LEVOTHROID) 25 MCG tablet Take 1 tablet (25 mcg total) by mouth daily. 90 tablet 3  . meloxicam (MOBIC) 15 MG tablet TAKE ONE TABLET BY MOUTH EACH AM WITH BREAKFAST AS NEEDED FOR PAIN 30 tablet 2  . metFORMIN (GLUCOPHAGE) 500 MG tablet TAKE 1 TABLET BY MOUTH 2 TIMES DAILY WITH A MEAL. 180 tablet 2  . Multiple Vitamin (MULTIVITAMIN) tablet Take 1 tablet by mouth daily.    Marland Kitchen. MYRBETRIQ 25 MG TB24 tablet TAKE 1 TABLET BY MOUTH EVERYDAY AT BEDTIME. 30 tablet 5  . nystatin (MYCOSTATIN/NYSTOP) powder APPLY 1 GRAM TOPICALLY 2 (TWO) TIMES DAILY. X 3 WEEKS. 60 g 1  . potassium chloride (K-DUR) 10 MEQ tablet TAKE 1 TABLET (10 MEQ TOTAL) BY MOUTH 2 (TWO) TIMES DAILY. 60 tablet 11  . rosuvastatin (CRESTOR) 10 MG tablet Take 1 tablet (10 mg total) by mouth at bedtime. 90 tablet 3  . senna (SENOKOT) 8.6 MG tablet Take 1 tablet by mouth at bedtime.     . traMADol (ULTRAM) 50 MG tablet Take 2 tablets (100 mg total) by mouth 2 (two) times daily. 120 tablet 0  . TURMERIC PO Take 1,000 mg by mouth daily.    . vitamin B-12  (CYANOCOBALAMIN) 100 MCG tablet Take 100 mcg by mouth at bedtime.     . DULoxetine (CYMBALTA) 30  MG capsule Take 1 capsule (30 mg total) by mouth daily. 10 capsule 3  . hydroxychloroquine (PLAQUENIL) 200 MG tablet TALE 2 TABLETS BY MOUTH WITH FOOD OR MILK ONCE A DAY 60 tablet 5  . losartan (COZAAR) 25 MG tablet TAKE 2 TABLETS BY MOUTH EVERY DAY 180 tablet 1  . Omega-3 Fatty Acids (FISH OIL) 1200 MG CAPS Take 1,200 mg by mouth 2 (two) times a week. WITH OMEGA-3 360 MG    . Vitamin D, Ergocalciferol, (DRISDOL) 1.25 MG (50000 UT) CAPS capsule Take 1 capsule (50,000 Units total) by mouth every Sunday. 4 capsule 8   No facility-administered medications prior to visit.     Allergies  Allergen Reactions  . Iodine Other (See Comments)    Shook violently and passed out.  . Codeine Other (See Comments)    Hallucinations  . Lipitor [Atorvastatin] Other (See Comments)    Myalgias    ROS Review of Systems    Objective:    Physical Exam  BP (!) 162/58   Pulse 81   Temp 98.4 F (36.9 C)   Ht 5\' 1"  (1.549 m)   Wt 203 lb (92.1 kg)   SpO2 95%   BMI 38.36 kg/m  Wt Readings from Last 3 Encounters:  01/10/19 203 lb (92.1 kg)  12/20/18 203 lb (92.1 kg)  12/08/18 203 lb (92.1 kg)     Health Maintenance Due  Topic Date Due  . OPHTHALMOLOGY EXAM  06/25/1952    There are no preventive care reminders to display for this patient.  Lab Results  Component Value Date   TSH 0.48 08/22/2018   Lab Results  Component Value Date   WBC 6.3 08/22/2018   HGB 11.0 (L) 08/22/2018   HCT 33.3 (L) 08/22/2018   MCV 86.7 08/22/2018   PLT 264 08/22/2018   Lab Results  Component Value Date   NA 142 08/22/2018   K 4.3 08/22/2018   CO2 30 08/22/2018   GLUCOSE 105 (H) 08/22/2018   BUN 21 08/22/2018   CREATININE 0.67 08/22/2018   BILITOT 0.5 08/22/2018   ALKPHOS 66 12/23/2017   AST 22 08/22/2018   ALT 15 08/22/2018   PROT 6.3 08/22/2018   ALBUMIN 3.7 12/23/2017   CALCIUM 9.7 08/22/2018    ANIONGAP 10 01/04/2018   Lab Results  Component Value Date   CHOL 128 12/03/2017   Lab Results  Component Value Date   HDL 77 12/03/2017   Lab Results  Component Value Date   LDLCALC 35 12/03/2017   Lab Results  Component Value Date   TRIG 75 12/03/2017   Lab Results  Component Value Date   CHOLHDL 1.7 12/03/2017   Lab Results  Component Value Date   HGBA1C 5.3 12/05/2018      Assessment & Plan:   Problem List Items Addressed This Visit      Other   Sleep disturbance    We discussed changes in sleep schedule at the last appointment and strategies around getting to bed at a decent time as well as making sure that things are conducive to sleep.  At this point she has tried to set her bedtime for about 2 AM so that is great and is an improvement but she still having a lot of falling asleep during the day and just feeling really tired and waking up in 2-hour intervals.  We discussed a low-dose of doxepin to see if this is helpful.  Did warn about excess sedation when she first gets up  in the morning.      Neck pain    Sounds like mechanical dysfunction in the cervical spine I really think she would benefit from formal physical therapy.  So new prescription sent to the pharmacy.      Relevant Orders   Ambulatory referral to Physical Therapy   Depression, major, single episode, mild ([place]) - Primary    Discussed options.  She is already on 60 mg of Cymbalta.  Rarely a 60 mg more effective especially in elderly patients so plan will be to switch her to fluoxetine instead.  We will need to decrease down the Cymbalta to 30 mg and taper off and then switch to fluoxetine.  Will need to follow-up in 3 to 4 weeks to make sure that she is doing well and so that her dose can be adjusted.  We also discussed therapy and counseling we had originally referred her back in April and she had an appointment in May but then canceled I think this could still be really helpful for her in the  long-term.      Relevant Medications   doxepin (SINEQUAN) 10 MG/ML solution   FLUoxetine (PROZAC) 20 MG tablet    Other Visit Diagnoses    Need for immunization against influenza       Relevant Orders   Flu Vaccine QUAD High Dose(Fluad) (Completed)   Chronic pain of both shoulders       Relevant Medications   doxepin (SINEQUAN) 10 MG/ML solution   FLUoxetine (PROZAC) 20 MG tablet   Other Relevant Orders   Ambulatory referral to Physical Therapy      Meds ordered this encounter  Medications  . doxepin (SINEQUAN) 10 MG/ML solution    Sig: Take 0.5 mLs (5 mg total) by mouth at bedtime.    Dispense:  120 mL    Refill:  0  . FLUoxetine (PROZAC) 20 MG tablet    Sig: 1/2 tab po QD x 8 days the increase to whole tab daily.    Dispense:  30 tablet    Refill:  0  . losartan (COZAAR) 100 MG tablet    Sig: Take 1 tablet (100 mg total) by mouth daily.    Dispense:  90 tablet    Refill:  0    Follow-up: Return in about 3 weeks (around 01/31/2019) for New start medication.    Beatrice Lecher, MD

## 2019-01-11 ENCOUNTER — Encounter: Payer: Self-pay | Admitting: Family Medicine

## 2019-01-11 ENCOUNTER — Other Ambulatory Visit: Payer: Self-pay | Admitting: *Deleted

## 2019-01-11 DIAGNOSIS — F320 Major depressive disorder, single episode, mild: Secondary | ICD-10-CM

## 2019-01-11 MED ORDER — DULOXETINE HCL 30 MG PO CPEP: 30.0000 mg | ORAL_CAPSULE | Freq: Every day | ORAL | 0 refills | Status: DC

## 2019-01-11 NOTE — Telephone Encounter (Signed)
I do see a note from Dr Lorin Mercy from 8/25 saying to continue gradual progression of activity.  Happy to see pt for follow up if needed.

## 2019-01-13 NOTE — Telephone Encounter (Signed)
Left VM with recommendation  

## 2019-01-19 ENCOUNTER — Ambulatory Visit (INDEPENDENT_AMBULATORY_CARE_PROVIDER_SITE_OTHER): Payer: Medicare Other | Admitting: Physical Therapy

## 2019-01-19 ENCOUNTER — Encounter: Payer: Self-pay | Admitting: Physical Therapy

## 2019-01-19 ENCOUNTER — Other Ambulatory Visit: Payer: Self-pay

## 2019-01-19 DIAGNOSIS — M6281 Muscle weakness (generalized): Secondary | ICD-10-CM | POA: Diagnosis not present

## 2019-01-19 DIAGNOSIS — R2681 Unsteadiness on feet: Secondary | ICD-10-CM | POA: Diagnosis not present

## 2019-01-19 DIAGNOSIS — R29898 Other symptoms and signs involving the musculoskeletal system: Secondary | ICD-10-CM | POA: Diagnosis not present

## 2019-01-19 DIAGNOSIS — M542 Cervicalgia: Secondary | ICD-10-CM | POA: Diagnosis not present

## 2019-01-19 DIAGNOSIS — R296 Repeated falls: Secondary | ICD-10-CM

## 2019-01-19 NOTE — Patient Instructions (Signed)
Access Code: [email or web address]  URL: [email or web address]  Date: 01/19/2019  Prepared by: Faustino Congress   Exercises  Seated Scapular Retraction - 10 reps - 1 sets - 5 sec hold - 2x daily - 7x weekly  Shoulder External Rotation and Scapular Retraction - 10 reps - 1 sets - 1-2 sec hold - 1x daily - 7x weekly  Patient Education  Trigger Point Dry Needling SUPINE Tips A prolonged snow angel     Being in the supine position means to be lying on the back. Lying on the back is the position of least compression on the bones and discs of the spine, and helps to re-align the natural curves of the back. Work toward 2-5 min hold can bend elbows to release stretch for a few seconds as needed     Thoracic Lift    Press shoulders down. Then lift mid-thoracic spine (area between the shoulder blades). Lift the breastbone slightly. Hold _5-10__ seconds. Relax. Repeat __10_ times.   Angels in the [place]: Single Arm can do both arms at the same time     Arms near sides, palms up. Press one arm lightly into floor, slide arm out to side and up alongside head. Keep contact with floor throughout motion. At maximal position, lengthen arm. Hold 2-3___ seconds. Relax. Repeat _10__ times. Slide arm back to start. Repeat with other arm.

## 2019-01-19 NOTE — Therapy (Signed)
[facility] [address] [place], [place], [address] Phone: [phone]   Fax:  [phone]  Physical Therapy Evaluation  Patient Details  Name: Joann Maddox MRN: [id number] Date of Birth: 12/30/1940 Referring Provider (PT): Agapito GamesMetheney, Catherine D, MD   Encounter Date: 01/19/2019  PT End of Session - 01/19/19 1309    Visit Number  1    Number of Visits  12    Date for PT Re-Evaluation  03/02/19    PT Start Time  1110    PT Stop Time  1143    PT Time Calculation (min)  33 min    Activity Tolerance  Patient tolerated treatment well    Behavior During Therapy  [facility] for tasks assessed/performed       Past Medical History:  Diagnosis Date  . Anxiety   . Arthritis   . Depression   . Headache(784.0)    migrains  . Heart murmur   . Hypothyroidism   . Pre-diabetes   . Rheumatoid arthritis(714.0)     Past Surgical History:  Procedure Laterality Date  . ABDOMINAL HYSTERECTOMY    . ANTERIOR CERVICAL DECOMP/DISCECTOMY FUSION N/A 01/03/2018   Procedure: ANTERIOR CERVICAL DECOMPRESSION/DISCECTOMY FUSION, ALLOGRAFT, PLATE;  Surgeon: Eldred MangesYates, Mark C, MD;  Location: [facility] OR;  Service: Orthopedics;  Laterality: N/A;  . APPENDECTOMY    . CHOLECYSTECTOMY    . REPLACEMENT TOTAL KNEE BILATERAL    . TONSILLECTOMY     as  a child  . TOTAL HIP ARTHROPLASTY     right and left    There were no vitals filed for this visit.   Subjective Assessment - 01/19/19 1111    Subjective  Pt is a 78 y/o female who returns to OPPT with c/o decreased balance, and neck pain.  Pt reports headaches due to neck pain, with recent flare up after Rt humerus fx.  Pt is being seen by orthopedist (Dr. Ophelia CharterYates), and reports humerus is essentially healed at this time.    Diagnostic tests  xrays: arthritis    Patient Stated Goals  improve neck pain    Currently in Pain?  Yes    Pain Score  5    at best 4/10; up to 9/10   Pain Location  Neck    Pain Orientation   Right;Left;Upper;Posterior    Pain Descriptors / Indicators  Sharp;Pressure;Headache;Aching    Pain Type  Acute pain;Chronic pain    Pain Radiating Towards  bil shoulders    Pain Onset  More than a month ago    Pain Frequency  Constant    Aggravating Factors   up walking/movement    Pain Relieving Factors  sitting using neck pillow         [facility] PT Assessment - 01/19/19 1118      Assessment   Medical Diagnosis  M54.2 (ICD-10-CM) - Neck pain    Referring Provider (PT)  Agapito GamesMetheney, Catherine D, MD    Onset Date/Surgical Date  --   late May 2020; chronic with acute exacerbation   Hand Dominance  Right    Next MD Visit  02/01/2019    Prior Therapy  many times for similar condition      Precautions   Precautions  Fall      Restrictions   Weight Bearing Restrictions  No      Balance Screen   Has the patient fallen in the past 6 months  Yes    How many times?  4  Has the patient had a decrease in activity level because of a fear of falling?   Yes    Is the patient reluctant to leave their home because of a fear of falling?   No      Home Film/video editor residence    Living Arrangements  Spouse/significant other    Additional Comments  no stairs at home; no A with with ADLs      Prior Function   Level of Independence  Independent    Vocation  Retired    Leisure  reading; computer and telephone work; no regular exercises      Cognition   Overall Cognitive Status  Within Functional Limits for tasks assessed      Observation/Other Assessments   Focus on Therapeutic Outcomes (FOTO)   deferred; pt arrived late      Posture/Postural Control   Posture/Postural Control  Postural limitations    Postural Limitations  Rounded Shoulders;Forward head;Increased thoracic kyphosis      ROM / Strength   AROM / PROM / Strength  AROM;Strength      AROM   AROM Assessment Site  Cervical    Cervical Flexion  40    Cervical Extension  10    Cervical - Right  Side Bend  13    Cervical - Left Side Bend  13    Cervical - Right Rotation  42    Cervical - Left Rotation  34      Strength   Overall Strength  Deficits    Overall Strength Comments  bil UE strength grossly 3-/5; hx of prior shoulder injuries      Palpation   Palpation comment  tenderness with trigger points in bil cervical paraspinals, upper traps, levator and suboccipitals      Ambulation/Gait   Gait Pattern  Shuffle    Gait velocity  decreased                Objective measurements completed on examination: See above findings.      [facility] Adult PT Treatment/Exercise - 01/19/19 1118      Self-Care   Self-Care  Other Self-Care Comments    Other Self-Care Comments   printed and modified prior HEP-see pt instructions; educated on exercises and how to perform - pt verbalized understanding; handout provided for reading stand to help with posture when reading             PT Education - 01/19/19 1309    Education Details  HEP    Person(s) Educated  Patient    Methods  Explanation;Handout;Demonstration    Comprehension  Verbalized understanding;Need further instruction       PT Short Term Goals - 01/19/19 1320      PT SHORT TERM GOAL #1   Title  balance to be assessed with LTGs to be written if referral placed    Status  New    Target Date  03/02/19        PT Long Term Goals - 01/19/19 1320      PT LONG TERM GOAL #1   Title  independent with HEP    Status  New    Target Date  03/02/19      PT LONG TERM GOAL #2   Title  report pain < 6/10 with activity for improved function    Status  New    Target Date  03/02/19      PT LONG TERM GOAL #3  Title  improve cervical extension and rotation by at least 5 degrees for improved function    Status  New    Target Date  03/02/19      PT LONG TERM GOAL #4   Title  n/a      PT LONG TERM GOAL #5   Title  n/a             Plan - 01/19/19 1310    Clinical Impression Statement  Pt is a 78 y/o  female who returns to OPPT for acute exacerbation of chronic neck pain.  Pt reports noncompliance with prior HEP, so started today with review of 5 basic exercises to start.  Also discussed modifications to decrease forward head posture when reading, as well as goals of PT.  Pt also requesting to address balance, so referral requested from MD.  Pt demonstrates poor posture, decreased strength and ROM and decreased mobility affecting functional mobility.  Pt will benefit from PT to address deficits listed.    Personal Factors and Comorbidities  Comorbidity 3+;Past/Current Experience    Comorbidities  sedentary lifestyle, RA, depression, anxiety, OA, bil THA, bil TKA, ACDF, Rt proximal humerus fx    Examination-Activity Limitations  Transfers;Locomotion Environmental consultant  Unstable/Unpredictable    Clinical Decision Making  High    Rehab Potential  Fair    PT Frequency  1x / week   recommend 2x/wk; pt requesting 1x/wk   PT Duration  6 weeks    PT Treatment/Interventions  ADLs/Self Care Home Management;Cryotherapy;Electrical Stimulation;Ultrasound;Traction;Moist Heat;Iontophoresis 4mg /ml Dexamethasone;Gait training;Stair training;Functional mobility training;Therapeutic activities;Therapeutic exercise;Balance training;Patient/family education;Neuromuscular re-education;Manual techniques;Passive range of motion;Vestibular;Taping;Dry needling    PT Next Visit Plan  review HEP and progress as able, manual/modalities/DN    PT Home Exercise Plan  [email or web address]    Consulted and Agree with Plan of Care  Patient       Patient will benefit from skilled therapeutic intervention in order to improve the following deficits and impairments:  Increased fascial restricitons, Increased muscle spasms, Pain, Impaired UE functional use, Decreased range of motion, Decreased activity tolerance, Decreased balance, Postural dysfunction,  Decreased strength  Visit Diagnosis: Cervicalgia - Plan: PT plan of care cert/re-cert  Other symptoms and signs involving the musculoskeletal system - Plan: PT plan of care cert/re-cert  Muscle weakness (generalized) - Plan: PT plan of care cert/re-cert  Unsteadiness on feet - Plan: PT plan of care cert/re-cert  Repeated falls - Plan: PT plan of care cert/re-cert     Problem List Patient Active Problem List   Diagnosis Date Noted  . Lymph edema 12/06/2018  . Sleep disturbance 12/05/2018  . Closed fracture of right proximal humerus 09/22/2018  . Bilateral lower extremity edema 09/20/2018  . Obesity (BMI 30-39.9) 09/02/2018  . Varicose veins of both lower extremities 08/22/2018  . Depression, major, single episode, mild (HCC) 08/22/2018  . Falls frequently 08/12/2018  . Mass of thigh, right 05/17/2018  . 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 Status post cervical spinal fusion 01/11/2018  . Neck pain 10/20/2017  . Chronic venous insufficiency 04/29/2017  . 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HCC) 02/12/2015  . Controlled diabetes mellitus type 2 with complications (HCC) 08/10/2014  . Chronic constipation 02/19/2014  . Rosacea 11/06/2013  . GAD (generalized anxiety disorder) 06/08/2013  . Essential hypertension, benign 05/04/2013  . Migraine with aura 05/04/2013  . Palpitations 05/04/2013  .  Murmur, heart 05/04/2013  . Hyperlipidemia 05/04/2013  . Hypothyroidism 05/04/2013  . Fatigue 02/16/2012  . Obesity, morbid (HCC) 02/16/2012  . Urge incontinence 02/16/2012  . Hyperglycemia 01/12/2012     Clarita CraneStephanie F Vertis Scheib, PT, DPT 01/19/19 1:24 PM     [facility] [address] [place], [place], [address] Phone: [phone]   Fax:  [phone]  Name: Joann Maddox MRN: [id number] Date of Birth: 12/28/1940

## 2019-01-26 ENCOUNTER — Ambulatory Visit (INDEPENDENT_AMBULATORY_CARE_PROVIDER_SITE_OTHER): Payer: Medicare Other | Admitting: Physical Therapy

## 2019-01-26 ENCOUNTER — Other Ambulatory Visit: Payer: Self-pay

## 2019-01-26 ENCOUNTER — Encounter: Payer: Self-pay | Admitting: Physical Therapy

## 2019-01-26 DIAGNOSIS — M542 Cervicalgia: Secondary | ICD-10-CM | POA: Diagnosis not present

## 2019-01-26 DIAGNOSIS — M6281 Muscle weakness (generalized): Secondary | ICD-10-CM | POA: Diagnosis not present

## 2019-01-26 DIAGNOSIS — R29898 Other symptoms and signs involving the musculoskeletal system: Secondary | ICD-10-CM | POA: Diagnosis not present

## 2019-01-26 DIAGNOSIS — R2681 Unsteadiness on feet: Secondary | ICD-10-CM | POA: Diagnosis not present

## 2019-01-26 NOTE — Therapy (Addendum)
[place], [place], [address] Phone: [phone]   Fax:  [phone]  Physical Therapy Treatment/Discharge Summary  Patient Details  Name: Joann Maddox MRN: [id number] Date of Birth: 1941/04/06 Referring Provider (PT): Hali Marry, MD   Encounter Date: 01/26/2019  PT End of Session - 01/26/19 1518    Visit Number  2    Number of Visits  12    Date for PT Re-Evaluation  03/02/19    PT Start Time  6759    PT Stop Time  1528    PT Time Calculation (min)  43 min    Activity Tolerance  Patient tolerated treatment well    Behavior During Therapy  [facility] for tasks assessed/performed       Past Medical History:  Diagnosis Date  . Anxiety   . Arthritis   . Depression   . Headache(784.0)    migrains  . Heart murmur   . Hypothyroidism   . Pre-diabetes   . Rheumatoid arthritis(714.0)     Past Surgical History:  Procedure Laterality Date  . ABDOMINAL HYSTERECTOMY    . ANTERIOR CERVICAL DECOMP/DISCECTOMY FUSION N/A 01/03/2018   Procedure: ANTERIOR CERVICAL DECOMPRESSION/DISCECTOMY FUSION, ALLOGRAFT, PLATE;  Surgeon: Marybelle Killings, MD;  Location: [place];  Service: Orthopedics;  Laterality: N/A;  . APPENDECTOMY    . CHOLECYSTECTOMY    . REPLACEMENT TOTAL KNEE BILATERAL    . TONSILLECTOMY     as  a child  . TOTAL HIP ARTHROPLASTY     right and left    There were no vitals filed for this visit.  Subjective Assessment - 01/26/19 1448    Subjective  has had a headache this week; hoping to get some dry needling done today.    Diagnostic tests  xrays: arthritis    Patient Stated Goals  improve neck pain    Currently in Pain?  Yes    Pain Score  6    "it's not terribly bad today"   Pain Location  --   "just pain in my body"   Pain Orientation  Right;Left;Posterior;Upper    Pain Descriptors / Indicators  Headache    Pain Onset  More than a month ago                        [facility] Adult PT Treatment/Exercise - 01/26/19 1507      Exercises   Exercises  Shoulder      Shoulder Exercises: Seated   Retraction  Both;10 reps    Retraction Limitations  5 sec hold    External Rotation  Both;10 reps    External Rotation Limitations  5 sec hold    Other Seated Exercises  shoulder rolls backwards      Modalities   Modalities  Electrical Stimulation;Moist Heat      Moist Heat Therapy   Number Minutes Moist Heat  12 Minutes    Moist Heat Location  Cervical      Electrical Stimulation   Electrical Stimulation Location  neck/upper traps    Electrical Stimulation Action  IFC    Electrical Stimulation Parameters  to tolerance    Electrical Stimulation Goals  Pain      Manual Therapy   Manual Therapy  Soft tissue mobilization    Soft tissue mobilization  upper traps into cervical paraspinals and suboccipitals       Trigger Point Dry Needling - 01/26/19 1508  Consent Given?  Yes    Education Handout Provided  Yes    Muscles Treated Head and Neck  Upper trapezius;Suboccipitals;Levator scapulae    Upper Trapezius Response  Twitch reponse elicited;Palpable increased muscle length    SubOccipitals Response  Twitch response elicited;Palpable increased muscle length    Levator Scapulae Response  Twitch response elicited;Palpable increased muscle length             PT Short Term Goals - 01/19/19 1320      PT SHORT TERM GOAL #1   Title  balance to be assessed with LTGs to be written if referral placed    Status  New    Target Date  03/02/19        PT Long Term Goals - 01/19/19 1320      PT LONG TERM GOAL #1   Title  independent with HEP    Status  New    Target Date  03/02/19      PT LONG TERM GOAL #2   Title  report pain < 6/10 with activity for improved function    Status  New    Target Date  03/02/19      PT LONG TERM GOAL #3   Title  improve cervical extension and rotation by at least 5 degrees for  improved function    Status  New    Target Date  03/02/19      PT LONG TERM GOAL #4   Title  n/a      PT LONG TERM GOAL #5   Title  n/a            Plan - 01/26/19 1518    Clinical Impression Statement  Pt presents today with limited compliance with HEP, but positive response today to DN.  No goals met as only 2nd visit.  Needs to increase compliance with HEP to maximize function.    Personal Factors and Comorbidities  Comorbidity 3+;Past/Current Experience    Comorbidities  sedentary lifestyle, RA, depression, anxiety, OA, bil THA, bil TKA, ACDF, Rt proximal humerus fx    Examination-Activity Limitations  Transfers;Locomotion Animator  Unstable/Unpredictable    Rehab Potential  Fair    PT Frequency  1x / week   recommend 2x/wk; pt requesting 1x/wk   PT Duration  6 weeks    PT Treatment/Interventions  ADLs/Self Care Home Management;Cryotherapy;Electrical Stimulation;Ultrasound;Traction;Moist Heat;Iontophoresis '4mg'$ /ml Dexamethasone;Gait training;Stair training;Functional mobility training;Therapeutic activities;Therapeutic exercise;Balance training;Patient/family education;Neuromuscular re-education;Manual techniques;Passive range of motion;Vestibular;Taping;Dry needling    PT Next Visit Plan  review HEP and progress as able, manual/modalities/DN    PT Home Exercise Plan  [id number]    Consulted and Agree with Plan of Care  Patient       Patient will benefit from skilled therapeutic intervention in order to improve the following deficits and impairments:  Increased fascial restricitons, Increased muscle spasms, Pain, Impaired UE functional use, Decreased range of motion, Decreased activity tolerance, Decreased balance, Postural dysfunction, Decreased strength  Visit Diagnosis: Cervicalgia  Other symptoms and signs involving the musculoskeletal system  Muscle weakness  (generalized)  Unsteadiness on feet     Problem List Patient Active Problem List   Diagnosis Date Noted  . Lymph edema 12/06/2018  . Sleep disturbance 12/05/2018  . Closed fracture of right proximal humerus 09/22/2018  . Bilateral lower extremity edema 09/20/2018  . Obesity (BMI 30-39.9) 09/02/2018  . Varicose veins of both lower extremities 08/22/2018  .  Depression, major, single episode, mild ([place]) 08/22/2018  . Falls frequently 08/12/2018  . Mass of thigh, right 05/17/2018  . 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 Status post cervical spinal fusion 01/11/2018  . Neck pain 10/20/2017  . Chronic venous insufficiency 04/29/2017  . 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[place]) 02/12/2015  . Controlled diabetes mellitus type 2 with complications ([place]) 06/34/9494  . Chronic constipation 02/19/2014  . Rosacea 11/06/2013  . GAD (generalized anxiety disorder) 06/08/2013  . Essential hypertension, benign 05/04/2013  . Migraine with aura 05/04/2013  . Palpitations 05/04/2013  . Murmur, heart 05/04/2013  . Hyperlipidemia 05/04/2013  . Hypothyroidism 05/04/2013  . Fatigue 02/16/2012  . Obesity, morbid ([place]) 02/16/2012  . Urge incontinence 02/16/2012  . Hyperglycemia 01/12/2012      Laureen Abrahams, PT, DPT 01/26/19 3:20 PM    [facility] [place], [place], [address] Phone: [phone]   Fax:  [phone]  Name: LONNI DIRDEN MRN: [id number] Date of Birth: 17-Mar-1941      PHYSICAL THERAPY DISCHARGE SUMMARY  Visits from Start of Care: 2  Current functional level related to goals / functional outcomes: See above   Remaining deficits: See above   Education / Equipment: HEP  Plan: Patient agrees to discharge.  Patient goals were not met. Patient is being discharged due to not returning since the last visit.  ?????      Laureen Abrahams, PT, DPT 04/17/19 8:29 AM  [facility] [place],  [address]  [phone] (office) [phone] (fax)

## 2019-02-01 ENCOUNTER — Encounter: Payer: Self-pay | Admitting: Family Medicine

## 2019-02-01 ENCOUNTER — Ambulatory Visit (INDEPENDENT_AMBULATORY_CARE_PROVIDER_SITE_OTHER): Payer: Medicare Other | Admitting: Family Medicine

## 2019-02-01 ENCOUNTER — Other Ambulatory Visit: Payer: Self-pay

## 2019-02-01 VITALS — BP 156/84 | HR 76 | Ht 61.0 in | Wt 203.0 lb

## 2019-02-01 DIAGNOSIS — I10 Essential (primary) hypertension: Secondary | ICD-10-CM | POA: Diagnosis not present

## 2019-02-01 DIAGNOSIS — I890 Lymphedema, not elsewhere classified: Secondary | ICD-10-CM | POA: Diagnosis not present

## 2019-02-01 DIAGNOSIS — F320 Major depressive disorder, single episode, mild: Secondary | ICD-10-CM | POA: Diagnosis not present

## 2019-02-01 NOTE — Assessment & Plan Note (Signed)
PHQ 9 score of 6 today and gad 7 score of 3.  She is has felt more down since we have been transitioning her medication.  Will start a whole tab of fluoxetine tomorrow.  Like to see her back in 5 weeks to make sure that she is doing well.  Also had a long discussion today about encouraging her to consider therapist/counseling.  I think this could really be helpful for her.  She is a little bit hesitant as she tends to come to keep things in.  Her husband is very supportive and she does talk to him sometimes.

## 2019-02-01 NOTE — Assessment & Plan Note (Signed)
In regards to her blood pressure when I looked back it actually has been high since the end of August.  I am not exactly sure why.  She does use her Lasix as needed some maybe she is using using that a little bit less.  She does have a brand-new home blood pressure cuff she just has not started using it yet.  Want her to check her blood pressure a few times a week over the next 2 weeks and then call me with those numbers.  She was feeling a little stressed today so certainly that could be contributing but it may also be that she is just had a increase in her pressure and we may need to adjust her medication regimen.

## 2019-02-01 NOTE — Progress Notes (Signed)
Established Patient Office Visit  Subjective:  Patient ID: Joann Maddox, female    DOB: [birth year]  Age: 78 y.o. MRN: [id number]  CC:  Chief Complaint  Patient presents with  . mood    HPI AMAL CLOWES presents for swelling in her lower extremities.  She says is getting to the point where it never really goes down.  It does not really get better after she goes to bed and gets up in the morning.  They just feel tight and heavy at times.  She does have compression stockings which she wears and her husband helps her put them on.  Follow-up depression/anxiety-she worries about her family a lot and her 2 older brothers who are in their late 34s and whose health is failing.  Plus she had a brother passed away earlier this year.  She is not currently in doing any therapy/counseling.  We did discontinue her duloxetine and she is now on fluoxetine.  She is currently on a half a tab and is transitioning to a whole tab tomorrow.  She has had some nausea this week but no vomiting.  It has not been bad enough that she has had to take any medications such as Tums or Pepto-Bismol.  She is been going to physical therapy for her neck and upper back.  They did some dry needling but she had a little bit more bruising than she is had in the past.  She thinks it is helped some.   She is noticed that her blood pressure has been elevated the last couple times that she has been here.  In looking back over the summer her blood pressures were at goal in the 130s and starting at the end of August her blood pressures have been elevated above 150.  She denies any recent changes to her medication though she did say that her losartan looks a little different than it used to.  She has been under some stress and has also been dealing with pain but has been going to physical therapy.  Past Medical History:  Diagnosis Date  . Anxiety   . Arthritis   . Depression   . Headache(784.0)    migrains  . Heart murmur   .  Hypothyroidism   . Pre-diabetes   . Rheumatoid arthritis(714.0)     Past Surgical History:  Procedure Laterality Date  . ABDOMINAL HYSTERECTOMY    . ANTERIOR CERVICAL DECOMP/DISCECTOMY FUSION N/A 01/03/2018   Procedure: ANTERIOR CERVICAL DECOMPRESSION/DISCECTOMY FUSION, ALLOGRAFT, PLATE;  Surgeon: Eldred Manges, MD;  Location[facility];  Service: Orthopedics;  Laterality: N/A;  . APPENDECTOMY    . CHOLECYSTECTOMY    . REPLACEMENT TOTAL KNEE BILATERAL    . TONSILLECTOMY     as  a child  . TOTAL HIP ARTHROPLASTY     right and left    Family History  Problem Relation Age of Onset  . Heart disease Father 34  . Diabetes Other        aunt   . Stroke Mother 68    Social History   Socioeconomic History  . Marital status: Married    Spouse name: Dorene Sorrow  . Number of children: 2  . Years of education: Not on file  . Highest education level: Not on file  Occupational History  . Occupation: Retired Runner, broadcasting/film/video    Comment: Corporate treasurer  Social Needs  . Financial resource strain: Not on file  . Food insecurity    Worry: Not  on file    Inability: Not on file  . Transportation needs    Medical: Not on file    Non-medical: Not on file  Tobacco Use  . Smoking status: Never Smoker  . Smokeless tobacco: Never Used  Substance and Sexual Activity  . Alcohol use: No  . Drug use: Not on file  . Sexual activity: Not on file  Lifestyle  . Physical activity    Days per week: Not on file    Minutes per session: Not on file  . Stress: Not on file  Relationships  . Social Herbalist on phone: Not on file    Gets together: Not on file    Attends religious service: Not on file    Active member of club or organization: Not on file    Attends meetings of clubs or organizations: Not on file    Relationship status: Not on file  . Intimate partner violence    Fear of current or ex partner: Not on file    Emotionally abused: Not on file    Physically abused: Not on file    Forced  sexual activity: Not on file  Other Topics Concern  . Not on file  Social History Narrative   No regular exercise. 2 caffeine drinks per day.     Outpatient Medications Prior to Visit  Medication Sig Dispense Refill  . azelaic acid (AZELEX) 20 % cream APPLY TOPICALL 2 TIMES A DAY (MORNING AND EVENING). APPLY AFTER SKIN IS WASHED AND PATTED DRY. 30 g 6  . butalbital-acetaminophen-caffeine (FIORICET, ESGIC) 50-325-40 MG tablet Take 1 tablet by mouth 2 (two) times daily as needed. (Patient taking differently: Take 1 tablet by mouth 2 (two) times daily as needed for migraine. ) 14 tablet 5  . diclofenac sodium (VOLTAREN) 1 % GEL Apply 2 g topically 4 (four) times daily. To affected joint. 100 g 11  . doxepin (SINEQUAN) 10 MG/ML solution Take 0.5 mLs (5 mg total) by mouth at bedtime. 120 mL 0  . FLUoxetine (PROZAC) 20 MG tablet 1/2 tab po QD x 8 days the increase to whole tab daily. 30 tablet 0  . folic acid (FOLVITE) 1 MG tablet TAKE 1 TABLET BY MOUTH EVERY DAY (Patient taking differently: Take 1 mg by mouth daily. ) 90 tablet 2  . furosemide (LASIX) 20 MG tablet TAKE 1 TABLET BY MOUTH EVERY DAY AS NEEDED (Patient taking differently: Take 20 mg by mouth daily as needed for fluid. ) 90 tablet 1  . gabapentin (NEURONTIN) 300 MG capsule One tab PO qHS for a week, then BID for a week, then TID. May double weekly to a max of 3,600mg /day 180 capsule 3  . hydroxychloroquine (PLAQUENIL) 200 MG tablet TALE 2 TABLETS BY MOUTH WITH FOOD OR MILK ONCE A DAY 180 tablet 2  . levothyroxine (SYNTHROID, LEVOTHROID) 25 MCG tablet Take 1 tablet (25 mcg total) by mouth daily. 90 tablet 3  . losartan (COZAAR) 100 MG tablet Take 1 tablet (100 mg total) by mouth daily. 90 tablet 0  . meloxicam (MOBIC) 15 MG tablet TAKE ONE TABLET BY MOUTH EACH AM WITH BREAKFAST AS NEEDED FOR PAIN 30 tablet 2  . metFORMIN (GLUCOPHAGE) 500 MG tablet TAKE 1 TABLET BY MOUTH 2 TIMES DAILY WITH A MEAL. 180 tablet 2  . Multiple Vitamin  (MULTIVITAMIN) tablet Take 1 tablet by mouth daily.    Marland Kitchen MYRBETRIQ 25 MG TB24 tablet TAKE 1 TABLET BY MOUTH EVERYDAY AT BEDTIME.  30 tablet 5  . nystatin (MYCOSTATIN/NYSTOP) powder APPLY 1 GRAM TOPICALLY 2 (TWO) TIMES DAILY. X 3 WEEKS. 60 g 1  . potassium chloride (K-DUR) 10 MEQ tablet TAKE 1 TABLET (10 MEQ TOTAL) BY MOUTH 2 (TWO) TIMES DAILY. 60 tablet 11  . rosuvastatin (CRESTOR) 10 MG tablet Take 1 tablet (10 mg total) by mouth at bedtime. 90 tablet 3  . senna (SENOKOT) 8.6 MG tablet Take 1 tablet by mouth at bedtime.     . traMADol (ULTRAM) 50 MG tablet Take 2 tablets (100 mg total) by mouth 2 (two) times daily. 120 tablet 0  . TURMERIC PO Take 1,000 mg by mouth daily.    . vitamin B-12 (CYANOCOBALAMIN) 100 MCG tablet Take 100 mcg by mouth at bedtime.     . DULoxetine (CYMBALTA) 30 MG capsule Take 1 capsule (30 mg total) by mouth daily. 7 capsule 0   No facility-administered medications prior to visit.     Allergies  Allergen Reactions  . Iodine Other (See Comments)    Shook violently and passed out.  . Codeine Other (See Comments)    Hallucinations  . Lipitor [Atorvastatin] Other (See Comments)    Myalgias    ROS Review of Systems    Objective:    Physical Exam  Constitutional: She is oriented to person, place, and time. She appears well-developed and well-nourished.  HENT:  Head: Normocephalic and atraumatic.  Cardiovascular: Normal rate, regular rhythm and normal heart sounds.  Pulmonary/Chest: Effort normal and breath sounds normal.  Neurological: She is alert and oriented to person, place, and time.  Skin: Skin is warm and dry.  Psychiatric: She has a normal mood and affect. Her behavior is normal.    BP (!) 156/84   Pulse 76   Ht 5\' 1"  (1.549 m)   Wt 203 lb (92.1 kg)   SpO2 96%   BMI 38.36 kg/m  Wt Readings from Last 3 Encounters:  02/01/19 203 lb (92.1 kg)  01/10/19 203 lb (92.1 kg)  12/20/18 203 lb (92.1 kg)     There are no preventive care reminders  to display for this patient.  There are no preventive care reminders to display for this patient.  Lab Results  Component Value Date   TSH 0.48 08/22/2018   Lab Results  Component Value Date   WBC 6.3 08/22/2018   HGB 11.0 (L) 08/22/2018   HCT 33.3 (L) 08/22/2018   MCV 86.7 08/22/2018   PLT 264 08/22/2018   Lab Results  Component Value Date   NA 142 08/22/2018   K 4.3 08/22/2018   CO2 30 08/22/2018   GLUCOSE 105 (H) 08/22/2018   BUN 21 08/22/2018   CREATININE 0.67 08/22/2018   BILITOT 0.5 08/22/2018   ALKPHOS 66 12/23/2017   AST 22 08/22/2018   ALT 15 08/22/2018   PROT 6.3 08/22/2018   ALBUMIN 3.7 12/23/2017   CALCIUM 9.7 08/22/2018   ANIONGAP 10 01/04/2018   Lab Results  Component Value Date   CHOL 128 12/03/2017   Lab Results  Component Value Date   HDL 77 12/03/2017   Lab Results  Component Value Date   LDLCALC 35 12/03/2017   Lab Results  Component Value Date   TRIG 75 12/03/2017   Lab Results  Component Value Date   CHOLHDL 1.7 12/03/2017   Lab Results  Component Value Date   HGBA1C 5.3 12/05/2018      Assessment & Plan:   Problem List Items Addressed This Visit  Cardiovascular and Mediastinum   Essential hypertension, benign - Primary    In regards to her blood pressure when I looked back it actually has been high since the end of August.  I am not exactly sure why.  She does use her Lasix as needed some maybe she is using using that a little bit less.  She does have a brand-new home blood pressure cuff she just has not started using it yet.  Want her to check her blood pressure a few times a week over the next 2 weeks and then call me with those numbers.  She was feeling a little stressed today so certainly that could be contributing but it may also be that she is just had a increase in her pressure and we may need to adjust her medication regimen.        Other   Lymph edema    Discussed again referral to lymphedema clinic.  She would  prefer [facility].  Discussed that this is the only true treatment for lymphedema is compressive therapy.  Her husband is willing to learn what to do.  Also discussed possibly getting her orthotic footwear after her lymphedema treatments.      Depression, major, single episode, mild ([facility])    PHQ 9 score of 6 today and gad 7 score of 3.  She is has felt more down since we have been transitioning her medication.  Will start a whole tab of fluoxetine tomorrow.  Like to see her back in 5 weeks to make sure that she is doing well.  Also had a long discussion today about encouraging her to consider therapist/counseling.  I think this could really be helpful for her.  She is a little bit hesitant as she tends to come to keep things in.  Her husband is very supportive and she does talk to him sometimes.       Other Visit Diagnoses    Lymphedema       Relevant Orders   Ambulatory referral to Physical Therapy      No orders of the defined types were placed in this encounter.   Follow-up: Return in about 4 weeks (around 03/01/2019) for New start medication.    Nani Gasser, MD

## 2019-02-01 NOTE — Assessment & Plan Note (Signed)
Discussed again referral to lymphedema clinic.  She would prefer [facility].  Discussed that this is the only true treatment for lymphedema is compressive therapy.  Her husband is willing to learn what to do.  Also discussed possibly getting her orthotic footwear after her lymphedema treatments.

## 2019-02-02 ENCOUNTER — Encounter: Payer: Medicare Other | Admitting: Physical Therapy

## 2019-02-09 ENCOUNTER — Encounter: Payer: Medicare Other | Admitting: Physical Therapy

## 2019-02-20 ENCOUNTER — Other Ambulatory Visit: Payer: Self-pay | Admitting: Family Medicine

## 2019-02-21 ENCOUNTER — Other Ambulatory Visit: Payer: Self-pay | Admitting: Family Medicine

## 2019-02-23 ENCOUNTER — Other Ambulatory Visit: Payer: Self-pay | Admitting: Family Medicine

## 2019-02-23 ENCOUNTER — Other Ambulatory Visit: Payer: Self-pay | Admitting: *Deleted

## 2019-02-23 DIAGNOSIS — D649 Anemia, unspecified: Secondary | ICD-10-CM

## 2019-03-01 ENCOUNTER — Ambulatory Visit (INDEPENDENT_AMBULATORY_CARE_PROVIDER_SITE_OTHER): Payer: Medicare Other | Admitting: Family Medicine

## 2019-03-01 ENCOUNTER — Other Ambulatory Visit: Payer: Self-pay

## 2019-03-01 ENCOUNTER — Encounter: Payer: Self-pay | Admitting: Family Medicine

## 2019-03-01 ENCOUNTER — Ambulatory Visit (INDEPENDENT_AMBULATORY_CARE_PROVIDER_SITE_OTHER): Payer: Medicare Other

## 2019-03-01 VITALS — BP 142/64 | HR 72 | Ht 61.0 in | Wt 198.0 lb

## 2019-03-01 DIAGNOSIS — M25512 Pain in left shoulder: Secondary | ICD-10-CM

## 2019-03-01 DIAGNOSIS — F320 Major depressive disorder, single episode, mild: Secondary | ICD-10-CM | POA: Diagnosis not present

## 2019-03-01 DIAGNOSIS — M542 Cervicalgia: Secondary | ICD-10-CM

## 2019-03-01 DIAGNOSIS — R296 Repeated falls: Secondary | ICD-10-CM

## 2019-03-01 DIAGNOSIS — I10 Essential (primary) hypertension: Secondary | ICD-10-CM

## 2019-03-01 DIAGNOSIS — G479 Sleep disorder, unspecified: Secondary | ICD-10-CM

## 2019-03-01 MED ORDER — METOPROLOL SUCCINATE ER 25 MG PO TB24: 25.0000 mg | ORAL_TABLET | Freq: Every day | ORAL | 2 refills | Status: DC

## 2019-03-01 MED ORDER — AMBULATORY NON FORMULARY MEDICATION: 0 refills | Status: DC

## 2019-03-01 NOTE — Assessment & Plan Note (Signed)
Doing well on the fluoxetine.  She wants to stay at 20 mg for now.  Plan to follow back up in about 6 weeks.

## 2019-03-01 NOTE — Progress Notes (Signed)
Established Patient Office Visit  Subjective:  Patient ID: Joann Maddox, female    DOB: 08/13/1940  Age: 78 y.o. MRN: [id number]  CC:  Chief Complaint  Patient presents with  . mood    she is up to a whole tab of the fluoxetine 20 mg feels that she is doing well on this  regimen    HPI Joann RamRita P Mclaurin presents for   Hypertension- Pt denies chest pain, SOB, dizziness, or heart palpitations.  Taking meds as directed w/o problems.  Denies medication side effects.  She had bought a new blood pressure cuff and our plan was to have her check it at home a couple times a week until she saw me back but she says she forgot about it.  Follow-up depression-overall she feels like she is doing better on fluoxetine.  She is up to a whole tab and has been for the last month.  She does feel like it is helpful and denies any side effects.  In regards to her cervical spine pain she feels like it is actually been getting a little bit worse but she also has not been to physical therapy in several weeks.  She also had a fall 2 weeks ago and landed on her left shoulder.  This is the opposite shoulder from the one she just had surgery on.  She says it has been sore and she has noticed a decreased range of motion.  She has not been trying any heat or ice.  She says it is hard to put a shirt on.   Still not sleeping well.  We sent a prescription over for doxepin liquid.  She said she picked it up but she was not sure what it was for and so she never took it.   Past Medical History:  Diagnosis Date  . Anxiety   . Arthritis   . Depression   . Headache(784.0)    migrains  . Heart murmur   . Hypothyroidism   . Pre-diabetes   . Rheumatoid arthritis(714.0)     Past Surgical History:  Procedure Laterality Date  . ABDOMINAL HYSTERECTOMY    . ANTERIOR CERVICAL DECOMP/DISCECTOMY FUSION N/A 01/03/2018   Procedure: ANTERIOR CERVICAL DECOMPRESSION/DISCECTOMY FUSION, ALLOGRAFT, PLATE;  Surgeon: Eldred MangesYates, Mark C, MD;   Location: [facility];  Service: Orthopedics;  Laterality: N/A;  . APPENDECTOMY    . CHOLECYSTECTOMY    . REPLACEMENT TOTAL KNEE BILATERAL    . TONSILLECTOMY     as  a child  . TOTAL HIP ARTHROPLASTY     right and left    Family History  Problem Relation Age of Onset  . Heart disease Father 6877  . Diabetes Other        aunt   . Stroke Mother 887    Social History   Socioeconomic History  . Marital status: Married    Spouse name: Dorene SorrowJerry  . Number of children: 2  . Years of education: Not on file  . Highest education level: Not on file  Occupational History  . Occupation: Retired Runner, broadcasting/film/videoTeacher    Comment: [employer]  Social Needs  . Financial resource strain: Not on file  . Food insecurity    Worry: Not on file    Inability: Not on file  . Transportation needs    Medical: Not on file    Non-medical: Not on file  Tobacco Use  . Smoking status: Never Smoker  . Smokeless tobacco: Never Used  Substance and  Sexual Activity  . Alcohol use: No  . Drug use: Not on file  . Sexual activity: Not on file  Lifestyle  . Physical activity    Days per week: Not on file    Minutes per session: Not on file  . Stress: Not on file  Relationships  . Social Musician on phone: Not on file    Gets together: Not on file    Attends religious service: Not on file    Active member of club or organization: Not on file    Attends meetings of clubs or organizations: Not on file    Relationship status: Not on file  . Intimate partner violence    Fear of current or ex partner: Not on file    Emotionally abused: Not on file    Physically abused: Not on file    Forced sexual activity: Not on file  Other Topics Concern  . Not on file  Social History Narrative   No regular exercise. 2 caffeine drinks per day.     Outpatient Medications Prior to Visit  Medication Sig Dispense Refill  . azelaic acid (AZELEX) 20 % cream APPLY TOPICALL 2 TIMES A DAY (MORNING AND EVENING). APPLY AFTER  SKIN IS WASHED AND PATTED DRY. 30 g 6  . butalbital-acetaminophen-caffeine (FIORICET, ESGIC) 50-325-40 MG tablet Take 1 tablet by mouth 2 (two) times daily as needed. (Patient taking differently: Take 1 tablet by mouth 2 (two) times daily as needed for migraine. ) 14 tablet 5  . diclofenac sodium (VOLTAREN) 1 % GEL Apply 2 g topically 4 (four) times daily. To affected joint. 100 g 11  . doxepin (SINEQUAN) 10 MG/ML solution Take 0.5 mLs (5 mg total) by mouth at bedtime. 120 mL 0  . FLUoxetine (PROZAC) 20 MG tablet 1/2 tab po QD x 8 days the increase to whole tab daily. 30 tablet 0  . folic acid (FOLVITE) 1 MG tablet TAKE 1 TABLET BY MOUTH EVERY DAY 90 tablet 3  . furosemide (LASIX) 20 MG tablet TAKE 1 TABLET BY MOUTH EVERY DAY AS NEEDED (Patient taking differently: Take 20 mg by mouth daily as needed for fluid. ) 90 tablet 1  . gabapentin (NEURONTIN) 300 MG capsule One tab PO qHS for a week, then BID for a week, then TID. May double weekly to a max of 3,600mg /day 180 capsule 3  . hydroxychloroquine (PLAQUENIL) 200 MG tablet TALE 2 TABLETS BY MOUTH WITH FOOD OR MILK ONCE A DAY 180 tablet 2  . levothyroxine (SYNTHROID, LEVOTHROID) 25 MCG tablet Take 1 tablet (25 mcg total) by mouth daily. 90 tablet 3  . losartan (COZAAR) 100 MG tablet Take 1 tablet (100 mg total) by mouth daily. 90 tablet 0  . meloxicam (MOBIC) 15 MG tablet TAKE ONE TABLET BY MOUTH EACH AM WITH BREAKFAST AS NEEDED FOR PAIN 30 tablet 2  . metFORMIN (GLUCOPHAGE) 500 MG tablet TAKE 1 TABLET BY MOUTH 2 TIMES DAILY WITH A MEAL. 180 tablet 2  . Multiple Vitamin (MULTIVITAMIN) tablet Take 1 tablet by mouth daily.    Marland Kitchen MYRBETRIQ 25 MG TB24 tablet TAKE 1 TABLET BY MOUTH EVERYDAY AT BEDTIME. 30 tablet 5  . nystatin (MYCOSTATIN/NYSTOP) powder APPLY 1 GRAM TOPICALLY 2 (TWO) TIMES DAILY. X 3 WEEKS. 60 g 1  . potassium chloride (K-DUR) 10 MEQ tablet TAKE 1 TABLET (10 MEQ TOTAL) BY MOUTH 2 (TWO) TIMES DAILY. 60 tablet 11  . rosuvastatin (CRESTOR) 10  MG tablet Take 1 tablet (  10 mg total) by mouth at bedtime. 90 tablet 3  . senna (SENOKOT) 8.6 MG tablet Take 1 tablet by mouth at bedtime.     . traMADol (ULTRAM) 50 MG tablet Take 2 tablets (100 mg total) by mouth 2 (two) times daily. 120 tablet 0  . TURMERIC PO Take 1,000 mg by mouth daily.    . vitamin B-12 (CYANOCOBALAMIN) 100 MCG tablet Take 100 mcg by mouth at bedtime.     . Vitamin D, Ergocalciferol, (DRISDOL) 1.25 MG (50000 UT) CAPS capsule Take 50,000 Units by mouth once a week.     No facility-administered medications prior to visit.     Allergies  Allergen Reactions  . Iodine Other (See Comments)    Shook violently and passed out.  . Codeine Other (See Comments)    Hallucinations  . Lipitor [Atorvastatin] Other (See Comments)    Myalgias    ROS Review of Systems    Objective:    Physical Exam  Constitutional: She is oriented to person, place, and time. She appears well-developed and well-nourished.  HENT:  Head: Normocephalic and atraumatic.  Cardiovascular: Normal rate and regular rhythm.  Murmur heard. 2/6 SEM  Pulmonary/Chest: Effort normal and breath sounds normal.  Musculoskeletal:     Comments: Able to lift her left shoulder to about 45 degrees.  She only has about 10 degrees of external rotation is unable to reach across her opposite shoulder.  She is tender over the anterior shoulder just over the bicep tendon area.  Neurological: She is alert and oriented to person, place, and time.  Skin: Skin is warm and dry.  Psychiatric: She has a normal mood and affect. Her behavior is normal.    BP (!) 142/64   Pulse 72   Ht 5\' 1"  (1.549 m)   Wt 198 lb (89.8 kg)   SpO2 97%   BMI 37.41 kg/m  Wt Readings from Last 3 Encounters:  03/01/19 198 lb (89.8 kg)  02/01/19 203 lb (92.1 kg)  01/10/19 203 lb (92.1 kg)     There are no preventive care reminders to display for this patient.  There are no preventive care reminders to display for this patient.  Lab  Results  Component Value Date   TSH 0.48 08/22/2018   Lab Results  Component Value Date   WBC 6.3 08/22/2018   HGB 11.0 (L) 08/22/2018   HCT 33.3 (L) 08/22/2018   MCV 86.7 08/22/2018   PLT 264 08/22/2018   Lab Results  Component Value Date   NA 142 08/22/2018   K 4.3 08/22/2018   CO2 30 08/22/2018   GLUCOSE 105 (H) 08/22/2018   BUN 21 08/22/2018   CREATININE 0.67 08/22/2018   BILITOT 0.5 08/22/2018   ALKPHOS 66 12/23/2017   AST 22 08/22/2018   ALT 15 08/22/2018   PROT 6.3 08/22/2018   ALBUMIN 3.7 12/23/2017   CALCIUM 9.7 08/22/2018   ANIONGAP 10 01/04/2018   Lab Results  Component Value Date   CHOL 128 12/03/2017   Lab Results  Component Value Date   HDL 77 12/03/2017   Lab Results  Component Value Date   LDLCALC 35 12/03/2017   Lab Results  Component Value Date   TRIG 75 12/03/2017   Lab Results  Component Value Date   CHOLHDL 1.7 12/03/2017   Lab Results  Component Value Date   HGBA1C 5.3 12/05/2018      Assessment & Plan:   Problem List Items Addressed This Visit  Cardiovascular and Mediastinum   Essential hypertension, benign - Primary    Blood pressure is still elevated today.  I am going to go ahead and add 25 mg of metoprolol at bedtime.  I do want to monitor for increased sedation or fatigue on the medication.  Did consider amlodipine but her diastolic tends to run low anyway and she really wants to avoid any type of diuretic because it makes her urinate more.      Relevant Medications   metoprolol succinate (TOPROL-XL) 25 MG 24 hr tablet     Other   Sleep disturbance    Courage her to try the doxepin and just make sure to follow the directions on the bottle carefully.      Neck pain    Courage her to get back in with physical therapy I think she was doing better when she was going to PT.      Falls frequently    Her husband would like [place] to try to get a Hoyer lift for the home.  Because of her frequent falls he says that when  it happens he is not able to get her up and ends up having to call the fire department.      Depression, major, single episode, mild ([place])    Doing well on the fluoxetine.  She wants to stay at 20 mg for now.  Plan to follow back up in about 6 weeks.       Other Visit Diagnoses    Acute pain of left shoulder       Relevant Orders   DG Shoulder Left   Frequent falls       Relevant Medications   AMBULATORY NON FORMULARY MEDICATION     Left shoulder pain-recommend x-ray for further work-up I do not think she has a fracture but certainly she could have a shoulder separation that her exam is most consistent with a frozen shoulder.  Once we have the x-ray results back consider more definitive evaluation with one of our sports medicine providers.  Meds ordered this encounter  Medications  . metoprolol succinate (TOPROL-XL) 25 MG 24 hr tablet    Sig: Take 1 tablet (25 mg total) by mouth at bedtime.    Dispense:  30 tablet    Refill:  2  . AMBULATORY NON FORMULARY MEDICATION    Sig: Medication Name: Harrel Lemon lift.  Dx frequent falls and husand can't get her up off the floor when he falls. [facility]    Dispense:  1 vial    Refill:  0    Follow-up: Return in about 6 weeks (around 04/12/2019) for BP and mood.    Beatrice Lecher, MD

## 2019-03-01 NOTE — Assessment & Plan Note (Signed)
Blood pressure is still elevated today.  I am going to go ahead and add 25 mg of metoprolol at bedtime.  I do want to monitor for increased sedation or fatigue on the medication.  Did consider amlodipine but her diastolic tends to run low anyway and she really wants to avoid any type of diuretic because it makes her urinate more.

## 2019-03-01 NOTE — Assessment & Plan Note (Signed)
Courage her to try the doxepin and just make sure to follow the directions on the bottle carefully.

## 2019-03-01 NOTE — Assessment & Plan Note (Signed)
Her husband would like [place] to try to get a Harrel Lemon lift for the home.  Because of her frequent falls he says that when it happens he is not able to get her up and ends up having to call the fire department.

## 2019-03-01 NOTE — Assessment & Plan Note (Signed)
Courage her to get back in with physical therapy I think she was doing better when she was going to PT.

## 2019-03-13 ENCOUNTER — Other Ambulatory Visit: Payer: Self-pay

## 2019-03-13 ENCOUNTER — Encounter: Payer: Self-pay | Admitting: Sports Medicine

## 2019-03-13 ENCOUNTER — Ambulatory Visit (INDEPENDENT_AMBULATORY_CARE_PROVIDER_SITE_OTHER): Payer: Medicare Other | Admitting: Sports Medicine

## 2019-03-13 DIAGNOSIS — M19012 Primary osteoarthritis, left shoulder: Secondary | ICD-10-CM | POA: Diagnosis not present

## 2019-03-13 NOTE — Assessment & Plan Note (Signed)
End-stage glenohumeral osteoarthritis, injection as above, adding formal PT. Last injection was 2-1/2 years ago.

## 2019-03-13 NOTE — Progress Notes (Signed)
Subjective:    CC: L shoulder pain  HPI: Joann Maddox is a pleasant 78 year old with a history significant for falls and OA of the L shoulder presenting today for severe L shoulder pain and limited range of motion. She had previously had an injection into her L shoulder which gave her good relief for several years. Her current symptoms onset after a fall in which she landed on her shoulder. X-ray was obtained and was negative for acute fracture but positive for severe OA with bone spur formation. She reports that she is unable to raise her arm past shoulder level.  I reviewed the past medical history, family history, social history, surgical history, and allergies today and no changes were needed.  Please see the problem list section below in epic for further details.  Past Medical History: Past Medical History:  Diagnosis Date  . Anxiety   . Arthritis   . Depression   . Headache(784.0)    migrains  . Heart murmur   . Hypothyroidism   . Pre-diabetes   . Rheumatoid arthritis(714.0)    Past Surgical History: Past Surgical History:  Procedure Laterality Date  . ABDOMINAL HYSTERECTOMY    . ANTERIOR CERVICAL DECOMP/DISCECTOMY FUSION N/A 01/03/2018   Procedure: ANTERIOR CERVICAL DECOMPRESSION/DISCECTOMY FUSION, ALLOGRAFT, PLATE;  Surgeon: Eldred Manges, MD;  Location: [facility] OR;  Service: Orthopedics;  Laterality: N/A;  . APPENDECTOMY    . CHOLECYSTECTOMY    . REPLACEMENT TOTAL KNEE BILATERAL    . TONSILLECTOMY     as  a child  . TOTAL HIP ARTHROPLASTY     right and left   Social History: Social History   Socioeconomic History  . Marital status: Married    Spouse name: Joann Maddox  . Number of children: 2  . Years of education: Not on file  . Highest education level: Not on file  Occupational History  . Occupation: Retired Runner, broadcasting/film/video    Comment: Corporate treasurer  Social Needs  . Financial resource strain: Not on file  . Food insecurity    Worry: Not on file    Inability: Not on file  .  Transportation needs    Medical: Not on file    Non-medical: Not on file  Tobacco Use  . Smoking status: Never Smoker  . Smokeless tobacco: Never Used  Substance and Sexual Activity  . Alcohol use: No  . Drug use: Not on file  . Sexual activity: Not on file  Lifestyle  . Physical activity    Days per week: Not on file    Minutes per session: Not on file  . Stress: Not on file  Relationships  . Social Musician on phone: Not on file    Gets together: Not on file    Attends religious service: Not on file    Active member of club or organization: Not on file    Attends meetings of clubs or organizations: Not on file    Relationship status: Not on file  Other Topics Concern  . Not on file  Social History Narrative   No regular exercise. 2 caffeine drinks per day.    Family History: Family History  Problem Relation Age of Onset  . Heart disease Father 74  . Diabetes Other        aunt   . Stroke Mother 26   Allergies: Allergies  Allergen Reactions  . Iodine Other (See Comments)    Shook violently and passed out.  . Codeine  Other (See Comments)    Hallucinations  . Lipitor [Atorvastatin] Other (See Comments)    Myalgias   Medications: See med rec.  Review of Systems: No fevers, chills, night sweats, weight loss, chest pain, or shortness of breath.   Objective:    General: Frail, well Developed, well nourished, and in no acute distress.  Neuro: Alert and oriented x3, extra-ocular muscles intact, sensation grossly intact.  HEENT: Normocephalic, atraumatic, pupils equal round reactive to light.  Skin: Warm and dry, no rashes. Cardiac: Regular rate and rhythm, no lower extremity edema.  Respiratory: Not using accessory muscles, speaking in full sentences.  L Shoulder: Inspection reveals no abnormalities, atrophy or asymmetry. Palpation significant for tenderness over AC joint and bicipital groove. ROM severely limited by pain. Rotator cuff strength  diminished in all planes. Signs of impingement with positive Neer and Hawkin's tests, empty can sign.  A/P: Joann Maddox has severe OA of her L shoulder which had previously been managed with steroid injection. She had a return of symptoms after a fall effecting the L shoulder. She received a steroid injection today but was advised that the curative method would be shoulder reconstruction. Formal PT was added to help slow progression and provide stability.  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 Last injection was 2-1/2 years ago.   ___________________________________________ Gwen Her. Dianah Field, M.D., ABFM., CAQSM. Primary Care and Sports Medicine [facility]  Adjunct Professor of [place] [employer] [facility] of Medicine

## 2019-03-15 ENCOUNTER — Other Ambulatory Visit: Payer: Self-pay | Admitting: Family Medicine

## 2019-03-22 ENCOUNTER — Ambulatory Visit: Payer: Medicare Other | Admitting: Orthopaedic Surgery

## 2019-03-28 ENCOUNTER — Ambulatory Visit: Payer: Medicare Other | Admitting: Rehabilitative and Restorative Service Providers"

## 2019-03-31 ENCOUNTER — Other Ambulatory Visit: Payer: Self-pay | Admitting: Family Medicine

## 2019-03-31 DIAGNOSIS — E038 Other specified hypothyroidism: Secondary | ICD-10-CM

## 2019-04-06 ENCOUNTER — Ambulatory Visit: Payer: Medicare Other | Admitting: Family Medicine

## 2019-04-09 ENCOUNTER — Other Ambulatory Visit: Payer: Self-pay | Admitting: Family Medicine

## 2019-04-10 ENCOUNTER — Encounter: Payer: Self-pay | Admitting: Sports Medicine

## 2019-04-10 ENCOUNTER — Ambulatory Visit (INDEPENDENT_AMBULATORY_CARE_PROVIDER_SITE_OTHER): Payer: Medicare Other

## 2019-04-10 ENCOUNTER — Other Ambulatory Visit: Payer: Self-pay

## 2019-04-10 ENCOUNTER — Ambulatory Visit (INDEPENDENT_AMBULATORY_CARE_PROVIDER_SITE_OTHER): Payer: Medicare Other | Admitting: Sports Medicine

## 2019-04-10 DIAGNOSIS — M19012 Primary osteoarthritis, left shoulder: Secondary | ICD-10-CM

## 2019-04-10 MED ORDER — TRAMADOL HCL 50 MG PO TABS: 50.0000 mg | ORAL_TABLET | Freq: Three times a day (TID) | ORAL | 0 refills | Status: DC

## 2019-04-10 NOTE — Progress Notes (Signed)
Subjective:    CC: Follow-up  HPI: Joann Maddox returns, she is a pleasant 78 year old female, she has end-stage left glenohumeral osteoarthritis, we have tried multiple medications as well as an intra-articular injection at the last visit with ultrasound guidance that resulted in only a few days of relief, she has a recurrence of pain, so, persistent, localized without radiation.  Joint line.  I reviewed the past medical history, family history, social history, surgical history, and allergies today and no changes were needed.  Please see the problem list section below in epic for further details.  Past Medical History: Past Medical History:  Diagnosis Date  . Anxiety   . Arthritis   . Depression   . Headache(784.0)    migrains  . Heart murmur   . Hypothyroidism   . Pre-diabetes   . Rheumatoid arthritis(714.0)    Past Surgical History: Past Surgical History:  Procedure Laterality Date  . ABDOMINAL HYSTERECTOMY    . ANTERIOR CERVICAL DECOMP/DISCECTOMY FUSION N/A 01/03/2018   Procedure: ANTERIOR CERVICAL DECOMPRESSION/DISCECTOMY FUSION, ALLOGRAFT, PLATE;  Surgeon: Eldred Manges, MD;  Location: [facility] OR;  Service: Orthopedics;  Laterality: N/A;  . APPENDECTOMY    . CHOLECYSTECTOMY    . REPLACEMENT TOTAL KNEE BILATERAL    . TONSILLECTOMY     as  a child  . TOTAL HIP ARTHROPLASTY     right and left   Social History: Social History   Socioeconomic History  . Marital status: Married    Spouse name: Dorene Sorrow  . Number of children: 2  . Years of education: Not on file  . Highest education level: Not on file  Occupational History  . Occupation: Retired Runner, broadcasting/film/video    Comment: Corporate treasurer  Tobacco Use  . Smoking status: Never Smoker  . Smokeless tobacco: Never Used  Substance and Sexual Activity  . Alcohol use: No  . Drug use: Not on file  . Sexual activity: Not on file  Other Topics Concern  . Not on file  Social History Narrative   No regular exercise. 2 caffeine drinks per day.      Social Determinants of Health   Financial Resource Strain:   . Difficulty of Paying Living Expenses: Not on file  Food Insecurity:   . Worried About Programme researcher, broadcasting/film/video in the Last Year: Not on file  . Ran Out of Food in the Last Year: Not on file  Transportation Needs:   . Lack of Transportation (Medical): Not on file  . Lack of Transportation (Non-Medical): Not on file  Physical Activity:   . Days of Exercise per Week: Not on file  . Minutes of Exercise per Session: Not on file  Stress:   . Feeling of Stress : Not on file  Social Connections:   . Frequency of Communication with Friends and Family: Not on file  . Frequency of Social Gatherings with Friends and Family: Not on file  . Attends Religious Services: Not on file  . Active Member of Clubs or Organizations: Not on file  . Attends Banker Meetings: Not on file  . Marital Status: Not on file   Family History: Family History  Problem Relation Age of Onset  . Heart disease Father 53  . Diabetes Other        aunt   . Stroke Mother 51   Allergies: Allergies  Allergen Reactions  . Iodine Other (See Comments)    Shook violently and passed out.  . Codeine Other (See Comments)  Hallucinations  . Lipitor [Atorvastatin] Other (See Comments)    Myalgias   Medications: See med rec.  Review of Systems: No fevers, chills, night sweats, weight loss, chest pain, or shortness of breath.   Objective:    General: Well Developed, well nourished, and in no acute distress.  Neuro: Alert and oriented x3, extra-ocular muscles intact, sensation grossly intact.  HEENT: Normocephalic, atraumatic, pupils equal round reactive to light, neck supple, no masses, no lymphadenopathy, thyroid nonpalpable.  Skin: Warm and dry, no rashes. Cardiac: Regular rate and rhythm, no murmurs rubs or gallops, no lower extremity edema.  Respiratory: Clear to auscultation bilaterally. Not using accessory muscles, speaking in full  sentences.  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 Increasing tramadol to 2 tabs 3 times a day, she has an appointment with Dr. Lorin Mercy, she does need total shoulder arthroplasty. We are going to do a preoperative CT of the shoulder for planning purposes. She will take a CD to her shoulder surgeon.   ___________________________________________ Gwen Her. Dianah Field, M.D., ABFM., CAQSM. Primary Care and Sports Medicine [facility]  Adjunct Professor of [place] [employer] of Medicine

## 2019-04-10 NOTE — Assessment & Plan Note (Signed)
End-stage glenohumeral osteoarthritis, no better with an injection in November. Increasing tramadol to 2 tabs 3 times a day, she has an appointment with Dr. Lorin Mercy, she does need total shoulder arthroplasty. We are going to do a preoperative CT of the shoulder for planning purposes. She will take a CD to her shoulder surgeon.

## 2019-04-13 ENCOUNTER — Ambulatory Visit (INDEPENDENT_AMBULATORY_CARE_PROVIDER_SITE_OTHER): Payer: Medicare Other | Admitting: Family Medicine

## 2019-04-13 ENCOUNTER — Encounter: Payer: Self-pay | Admitting: Family Medicine

## 2019-04-13 VITALS — BP 148/53 | HR 51 | Ht 61.0 in | Wt 194.0 lb

## 2019-04-13 DIAGNOSIS — I10 Essential (primary) hypertension: Secondary | ICD-10-CM | POA: Diagnosis not present

## 2019-04-13 DIAGNOSIS — F320 Major depressive disorder, single episode, mild: Secondary | ICD-10-CM

## 2019-04-13 DIAGNOSIS — M25512 Pain in left shoulder: Secondary | ICD-10-CM

## 2019-04-13 NOTE — Progress Notes (Signed)
Established Patient Office Visit  Subjective:  Patient ID: Joann Maddox, female    DOB: 1940-06-30  Age: 78 y.o. MRN: [id number]  CC:  Chief Complaint  Patient presents with  . Hypertension  . mood    HPI [name] presents for   Hypertension- Pt denies chest pain, SOB, dizziness, or heart palpitations.  Taking meds as directed w/o problems.  Denies medication side effects.  Added metoprolol 6 weeks.  Been tolerating the medication well without any side effects or problems.  She does have a home blood pressure cuff but has not used it recently.  F/U depression -overall she is doing okay.  She is taking the fluoxetine 20 mg and says she is actually been very happy with the results so far.  She has been much less tearful and feels like emotionally she really is getting to a better place.  She still continuing to have problems with her left shoulder and in fact is going to see orthopedist soon.  Has appt to see Dr. Lorin Mercy.  I believe her appointment is on January 5.  She says she will likely need surgery so she is a little bit stressed about that.  She really does not want to have surgery but if she needs it she will.    Past Medical History:  Diagnosis Date  . Anxiety   . Arthritis   . Depression   . Headache(784.0)    migrains  . Heart murmur   . Hypothyroidism   . Pre-diabetes   . Rheumatoid arthritis(714.0)     Past Surgical History:  Procedure Laterality Date  . ABDOMINAL HYSTERECTOMY    . ANTERIOR CERVICAL DECOMP/DISCECTOMY FUSION N/A 01/03/2018   Procedure: ANTERIOR CERVICAL DECOMPRESSION/DISCECTOMY FUSION, ALLOGRAFT, PLATE;  Surgeon: Marybelle Killings, MD;  Location: [facility];  Service: Orthopedics;  Laterality: N/A;  . APPENDECTOMY    . CHOLECYSTECTOMY    . REPLACEMENT TOTAL KNEE BILATERAL    . TONSILLECTOMY     as  a child  . TOTAL HIP ARTHROPLASTY     right and left    Family History  Problem Relation Age of Onset  . Heart disease Father 5  . Diabetes Other         aunt   . Stroke Mother 33    Social History   Socioeconomic History  . Marital status: Married    Spouse name: Sonia Side  . Number of children: 2  . Years of education: Not on file  . Highest education level: Not on file  Occupational History  . Occupation: Retired Pharmacist, hospital    Comment: Engineering geologist  Tobacco Use  . Smoking status: Never Smoker  . Smokeless tobacco: Never Used  Substance and Sexual Activity  . Alcohol use: No  . Drug use: Not on file  . Sexual activity: Not on file  Other Topics Concern  . Not on file  Social History Narrative   No regular exercise. 2 caffeine drinks per day.    Social Determinants of Health   Financial Resource Strain:   . Difficulty of Paying Living Expenses: Not on file  Food Insecurity:   . Worried About Charity fundraiser in the Last Year: Not on file  . Ran Out of Food in the Last Year: Not on file  Transportation Needs:   . Lack of Transportation (Medical): Not on file  . Lack of Transportation (Non-Medical): Not on file  Physical Activity:   . Days of Exercise per  Week: Not on file  . Minutes of Exercise per Session: Not on file  Stress:   . Feeling of Stress : Not on file  Social Connections:   . Frequency of Communication with Friends and Family: Not on file  . Frequency of Social Gatherings with Friends and Family: Not on file  . Attends Religious Services: Not on file  . Active Member of Clubs or Organizations: Not on file  . Attends Banker Meetings: Not on file  . Marital Status: Not on file  Intimate Partner Violence:   . Fear of Current or Ex-Partner: Not on file  . Emotionally Abused: Not on file  . Physically Abused: Not on file  . Sexually Abused: Not on file    Outpatient Medications Prior to Visit  Medication Sig Dispense Refill  . azelaic acid (AZELEX) 20 % cream APPLY TOPICALL 2 TIMES A DAY (MORNING AND EVENING). APPLY AFTER SKIN IS WASHED AND PATTED DRY. 30 g 6  .  butalbital-acetaminophen-caffeine (FIORICET, ESGIC) 50-325-40 MG tablet Take 1 tablet by mouth 2 (two) times daily as needed. (Patient taking differently: Take 1 tablet by mouth 2 (two) times daily as needed for migraine. ) 14 tablet 5  . diclofenac sodium (VOLTAREN) 1 % GEL Apply 2 g topically 4 (four) times daily. To affected joint. 100 g 11  . doxepin (SINEQUAN) 10 MG/ML solution Take 0.5 mLs (5 mg total) by mouth at bedtime. 120 mL 0  . FLUoxetine (PROZAC) 20 MG tablet 1/2 tab po QD x 8 days the increase to whole tab daily. 30 tablet 0  . folic acid (FOLVITE) 1 MG tablet TAKE 1 TABLET BY MOUTH EVERY DAY 90 tablet 3  . furosemide (LASIX) 20 MG tablet TAKE 1 TABLET BY MOUTH EVERY DAY AS NEEDED (Patient taking differently: Take 20 mg by mouth daily as needed for fluid. ) 90 tablet 1  . gabapentin (NEURONTIN) 300 MG capsule One tab PO qHS for a week, then BID for a week, then TID. May double weekly to a max of 3,600mg /day 180 capsule 3  . hydroxychloroquine (PLAQUENIL) 200 MG tablet TALE 2 TABLETS BY MOUTH WITH FOOD OR MILK ONCE A DAY 180 tablet 2  . levothyroxine (SYNTHROID) 25 MCG tablet TAKE 1 TABLET BY MOUTH EVERY DAY 20 tablet 13  . losartan (COZAAR) 100 MG tablet TAKE ONE TAB BY MOUTH DAILY 24 tablet 11  . meloxicam (MOBIC) 15 MG tablet TAKE ONE TABLET BY MOUTH EACH AM WITH BREAKFAST AS NEEDED FOR PAIN 30 tablet 2  . metFORMIN (GLUCOPHAGE) 500 MG tablet TAKE 1 TABLET BY MOUTH 2 TIMES DAILY WITH A MEAL. 180 tablet 2  . metoprolol succinate (TOPROL-XL) 25 MG 24 hr tablet Take 1 tablet (25 mg total) by mouth at bedtime. 30 tablet 2  . Multiple Vitamin (MULTIVITAMIN) tablet Take 1 tablet by mouth daily.    Marland Kitchen MYRBETRIQ 25 MG TB24 tablet TAKE 1 TABLET BY MOUTH EVERYDAY AT BEDTIME. 30 tablet 5  . nystatin (MYCOSTATIN/NYSTOP) powder APPLY 1 GRAM TOPICALLY 2 (TWO) TIMES DAILY. X 3 WEEKS. 60 g 1  . potassium chloride (K-DUR) 10 MEQ tablet TAKE 1 TABLET (10 MEQ TOTAL) BY MOUTH 2 (TWO) TIMES DAILY. 60  tablet 11  . rosuvastatin (CRESTOR) 10 MG tablet Take 1 tablet (10 mg total) by mouth at bedtime. 90 tablet 3  . senna (SENOKOT) 8.6 MG tablet Take 1 tablet by mouth at bedtime.     . traMADol (ULTRAM) 50 MG tablet Take 1-2 tablets (  50-100 mg total) by mouth 3 (three) times daily. 180 tablet 0  . TURMERIC PO Take 1,000 mg by mouth daily.    . vitamin B-12 (CYANOCOBALAMIN) 100 MCG tablet Take 100 mcg by mouth at bedtime.     . Vitamin D, Ergocalciferol, (DRISDOL) 1.25 MG (50000 UT) CAPS capsule Take 50,000 Units by mouth once a week.    . AMBULATORY NON FORMULARY MEDICATION Medication Name: Michiel Sites lift.  Dx frequent falls and husand can't get her up off the floor when he falls. [facility]   No facility-administered medications prior to visit.    Allergies  Allergen Reactions  . Iodine Other (See Comments)    Shook violently and passed out.  . Codeine Other (See Comments)    Hallucinations  . Lipitor [Atorvastatin] Other (See Comments)    Myalgias    ROS Review of Systems    Objective:    Physical Exam  Constitutional: She is oriented to person, place, and time. She appears well-developed and well-nourished.  HENT:  Head: Normocephalic and atraumatic.  Cardiovascular: Normal rate, regular rhythm and normal heart sounds.  Pulmonary/Chest: Effort normal and breath sounds normal.  Neurological: She is alert and oriented to person, place, and time.  Skin: Skin is warm and dry.  Psychiatric: She has a normal mood and affect. Her behavior is normal.    BP (!) 148/53   Pulse (!) 51   Ht 5\' 1"  (1.549 m)   Wt 194 lb (88 kg)   SpO2 100%   BMI 36.66 kg/m  Wt Readings from Last 3 Encounters:  04/13/19 194 lb (88 kg)  04/10/19 196 lb (88.9 kg)  03/13/19 200 lb (90.7 kg)     There are no preventive care reminders to display for this patient.  There are no preventive care reminders to display for this patient.  Lab Results  Component Value Date   TSH  0.48 08/22/2018   Lab Results  Component Value Date   WBC 6.3 08/22/2018   HGB 11.0 (L) 08/22/2018   HCT 33.3 (L) 08/22/2018   MCV 86.7 08/22/2018   PLT 264 08/22/2018   Lab Results  Component Value Date   NA 142 08/22/2018   K 4.3 08/22/2018   CO2 30 08/22/2018   GLUCOSE 105 (H) 08/22/2018   BUN 21 08/22/2018   CREATININE 0.67 08/22/2018   BILITOT 0.5 08/22/2018   ALKPHOS 66 12/23/2017   AST 22 08/22/2018   ALT 15 08/22/2018   PROT 6.3 08/22/2018   ALBUMIN 3.7 12/23/2017   CALCIUM 9.7 08/22/2018   ANIONGAP 10 01/04/2018   Lab Results  Component Value Date   CHOL 128 12/03/2017   Lab Results  Component Value Date   HDL 77 12/03/2017   Lab Results  Component Value Date   LDLCALC 35 12/03/2017   Lab Results  Component Value Date   TRIG 75 12/03/2017   Lab Results  Component Value Date   CHOLHDL 1.7 12/03/2017   Lab Results  Component Value Date   HGBA1C 5.3 12/05/2018      Assessment & Plan:   Problem List Items Addressed This Visit      Cardiovascular and Mediastinum   Essential hypertension, benign - Primary    BP elevated today but it does look a little bit better that her diastolic is a little bit low.  I want her to try checking her blood pressure at home over the next couple weeks and then let  me know what they are doing.  I wonder if they may actually be a little bit better.  She is also in some pain which could be contributing.  Consider increasing metoprolol if needed.          Other   Depression, major, single episode, mild (HCC)    Overall I do think she has improved.  PHQ-9 score of 7 and GAD-7 score of 7 today which is great.  We discussed options including continuing current regimen nation or even increasing her dose to 40 mg.  She rather stay at 20 mg for now also plan will be to follow-up in 2 months.       Other Visit Diagnoses    Acute pain of left shoulder          Left shoulder pain-planning on seeing Dr. Kevan NyGates.  Gave her a  copy of her CD to take with her to her appointment so that he can take a look at the images.  No orders of the defined types were placed in this encounter.   Follow-up: Return in about 2 months (around 06/14/2019) for Mood and BP check .    Nani Gasseratherine Clarke Peretz, MD

## 2019-04-13 NOTE — Assessment & Plan Note (Signed)
Overall I do think she has improved.  PHQ-9 score of 7 and GAD-7 score of 7 today which is great.  We discussed options including continuing current regimen nation or even increasing her dose to 40 mg.  She rather stay at 20 mg for now also plan will be to follow-up in 2 months.

## 2019-04-13 NOTE — Assessment & Plan Note (Addendum)
BP elevated today but it does look a little bit better that her diastolic is a little bit low.  I want her to try checking her blood pressure at home over the next couple weeks and then let me know what they are doing.  I wonder if they may actually be a little bit better.  She is also in some pain which could be contributing.  Consider increasing metoprolol if needed.

## 2019-04-13 NOTE — Patient Instructions (Signed)
  Check BPs at home for the next 2 weeks.  Give [place] those numbers.

## 2019-04-17 ENCOUNTER — Other Ambulatory Visit: Payer: Self-pay | Admitting: Family Medicine

## 2019-04-24 ENCOUNTER — Other Ambulatory Visit: Payer: Self-pay | Admitting: Family Medicine

## 2019-04-24 MED ORDER — NYSTATIN 100000 UNIT/GM EX POWD: CUTANEOUS | 1 refills | Status: DC

## 2019-04-25 ENCOUNTER — Other Ambulatory Visit: Payer: Self-pay | Admitting: Family Medicine

## 2019-05-02 ENCOUNTER — Encounter: Payer: Self-pay | Admitting: Orthopaedic Surgery

## 2019-05-02 ENCOUNTER — Other Ambulatory Visit: Payer: Self-pay

## 2019-05-02 ENCOUNTER — Ambulatory Visit (INDEPENDENT_AMBULATORY_CARE_PROVIDER_SITE_OTHER): Payer: Medicare Other | Admitting: Orthopaedic Surgery

## 2019-05-02 VITALS — Ht 60.0 in | Wt 197.0 lb

## 2019-05-02 DIAGNOSIS — M19012 Primary osteoarthritis, left shoulder: Secondary | ICD-10-CM | POA: Diagnosis not present

## 2019-05-02 MED ORDER — LIDOCAINE HCL 1 % IJ SOLN
0.5000 mL | INTRAMUSCULAR | Status: AC | PRN
  Administered 2019-05-02: .5 mL

## 2019-05-02 MED ORDER — METHYLPREDNISOLONE ACETATE 40 MG/ML IJ SUSP
40.0000 mg | INTRAMUSCULAR | Status: AC | PRN
  Administered 2019-05-02: 15:00:00 40 mg via INTRA_ARTICULAR

## 2019-05-02 MED ORDER — BUPIVACAINE HCL 0.25 % IJ SOLN
4.0000 mL | INTRAMUSCULAR | Status: AC | PRN
  Administered 2019-05-02: 4 mL via INTRA_ARTICULAR

## 2019-05-02 NOTE — Progress Notes (Signed)
Office Visit Note   Patient: Joann Maddox           Date of Birth: 19-May-1940           MRN: [id number] Visit Date: 05/02/2019              Requested by: Agapito Games, MD [address] [place],  [place] [address] PCP: Agapito Games, MD   Assessment & Plan: Visit Diagnoses:  1. Primary osteoarthritis of left shoulder     Plan: Shoulder injection performed we will recheck her in 2 months we can discuss setting her up for total shoulder arthroplasty depending on Covid surgical scheduling allow ability.  Follow-Up Instructions: Return in about 2 months (around 06/30/2019).   Orders:  Orders Placed This Encounter  Procedures   Large Joint Inj   No orders of the defined types were placed in this encounter.     Procedures: Large Joint Inj: L glenohumeral on 05/02/2019 3:00 PM Indications: pain Details: 22 G 1.5 in needle  Arthrogram: No  Medications: 4 mL bupivacaine 0.25 %; 40 mg methylPREDNISolone acetate 40 MG/ML; 0.5 mL lidocaine 1 % Outcome: tolerated well, no immediate complications Procedure, treatment alternatives, risks and benefits explained, specific risks discussed. Consent was given by the patient. Immediately prior to procedure a time out was called to verify the correct patient, procedure, equipment, support staff and site/side marked as required. Patient was prepped and draped in the usual sterile fashion.       Clinical Data: No additional findings.   Subjective: Chief Complaint  Patient presents with   Right Shoulder - Follow-up   Left Shoulder - Pain    HPI 79 year old female returns had a recent CT scan of her left shoulder for preoperative planning for total shoulder arthroplasty which shows severe erosive changes over shoulder without subluxation.  Opposite shoulder had comminuted fracture which is healed but she is having great difficulty with left shoulder pain with activities.  She is using tramadol without  significant relief.  She is having to ambulate with a cane in her right hand and is requesting an injection in her left shoulder so that she can wait a few months until the Covid situation improves.  Review of Systems 14 point systems updated unchanged from last office visit.  Type 2 diabetes noted.Last A1c 4 months ago was 5.3.   Objective: Vital Signs: Ht 5' (1.524 m)    Wt 197 lb (89.4 kg)    BMI 38.47 kg/m   Physical Exam Constitutional:      Appearance: She is well-developed.  HENT:     Head: Normocephalic.     Right Ear: External ear normal.     Left Ear: External ear normal.  Eyes:     Pupils: Pupils are equal, round, and reactive to light.  Neck:     Thyroid: No thyromegaly.     Trachea: No tracheal deviation.  Cardiovascular:     Rate and Rhythm: Normal rate.  Pulmonary:     Effort: Pulmonary effort is normal.  Abdominal:     Palpations: Abdomen is soft.  Skin:    General: Skin is warm and dry.  Neurological:     Mental Status: She is alert and oriented to person, place, and time.  Psychiatric:        Behavior: Behavior normal.     Ortho Exam patient has 30 to 60degrees abduction active and passive with pain pain with internal rotation and the  mid axillary line pain with external rotation 45 degrees.  Crepitus with shoulder range of motion.  Specialty Comments:  No specialty comments available.  Imaging: No results found.   PMFS History: Patient Active Problem List   Diagnosis Date Noted   Lymph edema 12/06/2018   Sleep disturbance 12/05/2018   Closed fracture of right proximal humerus 09/22/2018   Bilateral lower extremity edema 09/20/2018   Obesity (BMI 30-39.9) 09/02/2018   Varicose veins of both lower extremities 08/22/2018   Depression, major, single episode, mild ([facility]) 08/22/2018   Falls frequently 08/12/2018   Mass of thigh, right 05/17/2018   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   Status post cervical spinal fusion  01/11/2018   Neck pain 10/20/2017   Chronic venous insufficiency 04/29/2017   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[facility]) 02/12/2015   Controlled diabetes mellitus type 2 with complications ([facility]) 09/32/6712   Chronic constipation 02/19/2014   Rosacea 11/06/2013   GAD (generalized anxiety disorder) 06/08/2013   Essential hypertension, benign 05/04/2013   Migraine with aura 05/04/2013   Murmur, heart 05/04/2013   Hyperlipidemia 05/04/2013   Hypothyroidism 05/04/2013   Fatigue 02/16/2012   Obesity, morbid ([facility]) 02/16/2012   Urge incontinence 02/16/2012   Hyperglycemia 01/12/2012   Past Medical History:  Diagnosis Date   Anxiety    Arthritis    Depression    Headache(784.0)    migrains   Heart murmur    Hypothyroidism    Pre-diabetes    Rheumatoid arthritis(714.0)     Family History  Problem Relation Age of Onset   Heart disease Father 78   Diabetes Other        aunt    Stroke Mother 69    Past Surgical History:  Procedure Laterality Date   ABDOMINAL HYSTERECTOMY     ANTERIOR CERVICAL DECOMP/DISCECTOMY FUSION N/A 01/03/2018   Procedure: ANTERIOR CERVICAL DECOMPRESSION/DISCECTOMY FUSION, ALLOGRAFT, PLATE;  Surgeon: Marybelle Killings, MD;  Location: [facility];  Service: Orthopedics;  Laterality: N/A;   APPENDECTOMY     CHOLECYSTECTOMY     REPLACEMENT TOTAL KNEE BILATERAL     TONSILLECTOMY     as  a child   TOTAL HIP ARTHROPLASTY     right and left   Social History   Occupational History   Occupation: Retired Pharmacist, hospital    Comment: Engineering geologist  Tobacco Use   Smoking status: Never Smoker   Smokeless tobacco: Never Used  Substance and Sexual Activity   Alcohol use: No   Drug use: Not on file   Sexual activity: Not on file

## 2019-05-03 ENCOUNTER — Other Ambulatory Visit: Payer: Self-pay | Admitting: Family Medicine

## 2019-05-03 MED ORDER — BUTALBITAL-APAP-CAFFEINE 50-325-40 MG PO TABS: 1.0000 | ORAL_TABLET | Freq: Two times a day (BID) | ORAL | 5 refills | Status: DC | PRN

## 2019-05-03 NOTE — Telephone Encounter (Signed)
Patient called and stated that she forgot to ask you at the appointment from the other day about sending in her migraine medication. She reports her migraines are back again. Please advise.

## 2019-05-12 ENCOUNTER — Telehealth: Payer: Self-pay

## 2019-05-12 NOTE — Telephone Encounter (Signed)
Joann Maddox's husband called and states he needs information about the covid vaccine. I called Joann Maddox back and gave her the information about Joann Maddox vaccine information.   COVID-19 Vaccination Clinics [facility] On Tuesday, Jan. 19, the [facility] of Public Health [facility]) and [employer] [facility] begin large-scale COVID-19 vaccinations at the [facility]. The vaccinations are appointment only and for those 65 and older. It is expected that 750 will initially be vaccinated per day [facility]. Capacity is expected to grow in the weeks ahead. However, the number of reservations accepted depends on the amount of vaccine available. Online or Phone Registration Only. Walk-ins will NOT be accepted. Register online for the [facility] clinic or call [phone]

## 2019-05-14 ENCOUNTER — Other Ambulatory Visit: Payer: Self-pay | Admitting: Family Medicine

## 2019-05-30 ENCOUNTER — Other Ambulatory Visit: Payer: Self-pay | Admitting: Family Medicine

## 2019-06-02 ENCOUNTER — Other Ambulatory Visit: Payer: Self-pay | Admitting: Family Medicine

## 2019-06-03 ENCOUNTER — Other Ambulatory Visit: Payer: Self-pay | Admitting: Family Medicine

## 2019-06-15 ENCOUNTER — Ambulatory Visit: Payer: Medicare Other | Admitting: Family Medicine

## 2019-06-22 ENCOUNTER — Other Ambulatory Visit: Payer: Self-pay

## 2019-06-22 ENCOUNTER — Ambulatory Visit (INDEPENDENT_AMBULATORY_CARE_PROVIDER_SITE_OTHER): Payer: Medicare Other | Admitting: Family Medicine

## 2019-06-22 ENCOUNTER — Encounter: Payer: Self-pay | Admitting: Family Medicine

## 2019-06-22 VITALS — BP 132/64 | HR 62 | Ht 60.0 in | Wt 200.0 lb

## 2019-06-22 DIAGNOSIS — E118 Type 2 diabetes mellitus with unspecified complications: Secondary | ICD-10-CM | POA: Diagnosis not present

## 2019-06-22 DIAGNOSIS — I10 Essential (primary) hypertension: Secondary | ICD-10-CM

## 2019-06-22 DIAGNOSIS — F411 Generalized anxiety disorder: Secondary | ICD-10-CM

## 2019-06-22 DIAGNOSIS — F320 Major depressive disorder, single episode, mild: Secondary | ICD-10-CM | POA: Diagnosis not present

## 2019-06-22 DIAGNOSIS — D509 Iron deficiency anemia, unspecified: Secondary | ICD-10-CM | POA: Insufficient documentation

## 2019-06-22 LAB — POCT GLYCOSYLATED HEMOGLOBIN (HGB A1C): Hemoglobin A1C: 5.4 % (ref 4.0–5.6)

## 2019-06-22 MED ORDER — METFORMIN HCL 500 MG PO TABS: 500.0000 mg | ORAL_TABLET | Freq: Every day | ORAL | 0 refills | Status: DC

## 2019-06-22 MED ORDER — GABAPENTIN 300 MG PO CAPS: 300.0000 mg | ORAL_CAPSULE | Freq: Every day | ORAL | 3 refills | Status: DC

## 2019-06-22 MED ORDER — NYSTATIN 100000 UNIT/GM EX POWD: CUTANEOUS | 11 refills | Status: DC

## 2019-06-22 NOTE — Progress Notes (Signed)
Established Patient Office Visit  Subjective:  Patient ID: Joann Maddox, female    DOB: 05/08/1940  Age: 79 y.o. MRN: [id number]  CC:  Chief Complaint  Patient presents with  . Hypertension  . mood    HPI Joann Maddox presents for  Diabetes - no hypoglycemic events. No wounds or sores that are not healing well. No increased thirst or urination. Checking glucose at home. Taking medications as prescribed without any side effects.  Hypertension- Pt denies chest pain, SOB, dizziness, or heart palpitations.  Taking meds as directed w/o problems.  Denies medication side effects.   F/U depression/Anxiety -currently on fluoxetine.  Joann Maddox feels like overall the medication has been helpful and Joann Maddox is tolerating it well.  When I saw her back in December Joann Maddox wanted to continue with the 20 mg dose so we opted to stay on that for a while longer.\  Joann Maddox also reports that Joann Maddox has been taking the gabapentin at bedtime and that really seems to be helping with her pain and her sleep so Joann Maddox actually has not been using the doxepin.  We will go ahead and remove that from the medication list.  Joann Maddox is taking just a 300 mg capsule gabapentin so we will update her med list and send over new prescription for that as well.  Past Medical History:  Diagnosis Date  . Anxiety   . Arthritis   . Depression   . Headache(784.0)    migrains  . Heart murmur   . Hypothyroidism   . Pre-diabetes   . Rheumatoid arthritis(714.0)     Past Surgical History:  Procedure Laterality Date  . ABDOMINAL HYSTERECTOMY    . ANTERIOR CERVICAL DECOMP/DISCECTOMY FUSION N/A 01/03/2018   Procedure: ANTERIOR CERVICAL DECOMPRESSION/DISCECTOMY FUSION, ALLOGRAFT, PLATE;  Surgeon: Eldred Manges, MD;  Location: [facility] OR;  Service: Orthopedics;  Laterality: N/A;  . APPENDECTOMY    . CHOLECYSTECTOMY    . REPLACEMENT TOTAL KNEE BILATERAL    . TONSILLECTOMY     as  a child  . TOTAL HIP ARTHROPLASTY     right and left    Family History   Problem Relation Age of Onset  . Heart disease Father 75  . Diabetes Other        aunt   . Stroke Mother 50    Social History   Socioeconomic History  . Marital status: Married    Spouse name: Dorene Sorrow  . Number of children: 2  . Years of education: Not on file  . Highest education level: Not on file  Occupational History  . Occupation: Retired Runner, broadcasting/film/video    Comment: Corporate treasurer  Tobacco Use  . Smoking status: Never Smoker  . Smokeless tobacco: Never Used  Substance and Sexual Activity  . Alcohol use: No  . Drug use: Not on file  . Sexual activity: Not on file  Other Topics Concern  . Not on file  Social History Narrative   No regular exercise. 2 caffeine drinks per day.    Social Determinants of Health   Financial Resource Strain:   . Difficulty of Paying Living Expenses: Not on file  Food Insecurity:   . Worried About Programme researcher, broadcasting/film/video in the Last Year: Not on file  . Ran Out of Food in the Last Year: Not on file  Transportation Needs:   . Lack of Transportation (Medical): Not on file  . Lack of Transportation (Non-Medical): Not on file  Physical Activity:   .  Days of Exercise per Week: Not on file  . Minutes of Exercise per Session: Not on file  Stress:   . Feeling of Stress : Not on file  Social Connections:   . Frequency of Communication with Friends and Family: Not on file  . Frequency of Social Gatherings with Friends and Family: Not on file  . Attends Religious Services: Not on file  . Active Member of Clubs or Organizations: Not on file  . Attends Archivist Meetings: Not on file  . Marital Status: Not on file  Intimate Partner Violence:   . Fear of Current or Ex-Partner: Not on file  . Emotionally Abused: Not on file  . Physically Abused: Not on file  . Sexually Abused: Not on file    Outpatient Medications Prior to Visit  Medication Sig Dispense Refill  . azelaic acid (AZELEX) 20 % cream APPLY TOPICALL 2 TIMES A DAY (MORNING AND  EVENING). APPLY AFTER SKIN IS WASHED AND PATTED DRY. 30 g 6  . butalbital-acetaminophen-caffeine (FIORICET) 50-325-40 MG tablet Take 1 tablet by mouth 2 (two) times daily as needed. 14 tablet 5  . diclofenac sodium (VOLTAREN) 1 % GEL Apply 2 g topically 4 (four) times daily. To affected joint. 100 g 11  . FLUoxetine (PROZAC) 20 MG tablet 1/2 tab po QD x 8 days the increase to whole tab daily. (Patient taking differently: Take 20 mg by mouth daily. ) 30 tablet 0  . folic acid (FOLVITE) 1 MG tablet TAKE 1 TABLET BY MOUTH EVERY DAY 90 tablet 3  . furosemide (LASIX) 20 MG tablet TAKE 1 TABLET BY MOUTH EVERY DAY AS NEEDED 30 tablet 0  . hydroxychloroquine (PLAQUENIL) 200 MG tablet TALE 2 TABLETS BY MOUTH WITH FOOD OR MILK ONCE A DAY 180 tablet 2  . levothyroxine (SYNTHROID) 25 MCG tablet TAKE 1 TABLET BY MOUTH EVERY DAY 20 tablet 13  . losartan (COZAAR) 100 MG tablet TAKE ONE TAB BY MOUTH DAILY 24 tablet 11  . meloxicam (MOBIC) 15 MG tablet TAKE ONE TABLET BY MOUTH EACH AM WITH BREAKFAST AS NEEDED FOR PAIN 30 tablet 2  . metoprolol succinate (TOPROL-XL) 25 MG 24 hr tablet TAKE 1 TABLET BY MOUTH EVERYDAY AT BEDTIME 30 tablet 2  . Multiple Vitamin (MULTIVITAMIN) tablet Take 1 tablet by mouth daily.    Marland Kitchen MYRBETRIQ 25 MG TB24 tablet TAKE 1 TABLET BY MOUTH EVERYDAY AT BEDTIME. 30 tablet 5  . potassium chloride (K-DUR) 10 MEQ tablet TAKE 1 TABLET (10 MEQ TOTAL) BY MOUTH 2 (TWO) TIMES DAILY. 60 tablet 11  . rosuvastatin (CRESTOR) 10 MG tablet TAKE 1 TABLET BY MOUTH EVERYDAY AT BEDTIME 30 tablet 11  . senna (SENOKOT) 8.6 MG tablet Take 1 tablet by mouth at bedtime.     . traMADol (ULTRAM) 50 MG tablet Take 1-2 tablets (50-100 mg total) by mouth 3 (three) times daily. 180 tablet 0  . TURMERIC PO Take 1,000 mg by mouth daily.    . vitamin B-12 (CYANOCOBALAMIN) 100 MCG tablet Take 100 mcg by mouth at bedtime.     . Vitamin D, Ergocalciferol, (DRISDOL) 1.25 MG (50000 UT) CAPS capsule TAKE 1 CAPSULE (50,000 UNITS  TOTAL) BY MOUTH EVERY SUNDAY. 12 capsule 2  . gabapentin (NEURONTIN) 300 MG capsule One tab PO qHS for a week, then BID for a week, then TID. May double weekly to a max of 3,600mg /day 180 capsule 3  . metFORMIN (GLUCOPHAGE) 500 MG tablet TAKE 1 TABLET BY MOUTH 2 TIMES DAILY  WITH A MEAL. 60 tablet 5  . nystatin (MYCOSTATIN/NYSTOP) powder Apply 1 gram to affected area 2 times daily. 60 g 1  . doxepin (SINEQUAN) 10 MG/ML solution Take 0.5 mLs (5 mg total) by mouth at bedtime. (Patient not taking: Reported on 06/22/2019) 120 mL 0   No facility-administered medications prior to visit.    Allergies  Allergen Reactions  . Iodine Other (See Comments)    Shook violently and passed out.  . Codeine Other (See Comments)    Hallucinations  . Lipitor [Atorvastatin] Other (See Comments)    Myalgias    ROS Review of Systems    Objective:    Physical Exam  Constitutional: Joann Maddox is oriented to person, place, and time. Joann Maddox appears well-developed and well-nourished.  HENT:  Head: Normocephalic and atraumatic.  Cardiovascular: Normal rate, regular rhythm and normal heart sounds.  Pulmonary/Chest: Effort normal and breath sounds normal.  Neurological: Joann Maddox is alert and oriented to person, place, and time.  Skin: Skin is warm and dry.  Psychiatric: Joann Maddox has a normal mood and affect. Her behavior is normal.    BP 132/64   Pulse 62   Ht 5' (1.524 m)   Wt 200 lb (90.7 kg)   SpO2 100%   BMI 39.06 kg/m  Wt Readings from Last 3 Encounters:  06/22/19 200 lb (90.7 kg)  05/02/19 197 lb (89.4 kg)  04/13/19 194 lb (88 kg)     There are no preventive care reminders to display for this patient.  There are no preventive care reminders to display for this patient.  Lab Results  Component Value Date   TSH 0.48 08/22/2018   Lab Results  Component Value Date   WBC 6.3 08/22/2018   HGB 11.0 (L) 08/22/2018   HCT 33.3 (L) 08/22/2018   MCV 86.7 08/22/2018   PLT 264 08/22/2018   Lab Results   Component Value Date   NA 142 08/22/2018   K 4.3 08/22/2018   CO2 30 08/22/2018   GLUCOSE 105 (H) 08/22/2018   BUN 21 08/22/2018   CREATININE 0.67 08/22/2018   BILITOT 0.5 08/22/2018   ALKPHOS 66 12/23/2017   AST 22 08/22/2018   ALT 15 08/22/2018   PROT 6.3 08/22/2018   ALBUMIN 3.7 12/23/2017   CALCIUM 9.7 08/22/2018   ANIONGAP 10 01/04/2018   Lab Results  Component Value Date   CHOL 128 12/03/2017   Lab Results  Component Value Date   HDL 77 12/03/2017   Lab Results  Component Value Date   LDLCALC 35 12/03/2017   Lab Results  Component Value Date   TRIG 75 12/03/2017   Lab Results  Component Value Date   CHOLHDL 1.7 12/03/2017   Lab Results  Component Value Date   HGBA1C 5.4 06/22/2019      Assessment & Plan:   Problem List Items Addressed This Visit      Cardiovascular and Mediastinum   Essential hypertension, benign - Primary   Relevant Orders   CBC   COMPLETE METABOLIC PANEL WITH GFR   Lipid panel   Fe+TIBC+Fer     Endocrine   Controlled diabetes mellitus type 2 with complications (HCC)   Relevant Medications   metFORMIN (GLUCOPHAGE) 500 MG tablet   Other Relevant Orders   CBC   COMPLETE METABOLIC PANEL WITH GFR   Lipid panel   Fe+TIBC+Fer   POCT glycosylated hemoglobin (Hb A1C) (Completed)     Other   Iron deficiency anemia   Relevant Orders   CBC  Fe+TIBC+Fer   GAD (generalized anxiety disorder)    GAD-7 score of 5 today, previous of 5.  Rates a somewhat difficult.      Depression, major, single episode, mild (HCC)    HQ 9 score of 4 today previous of 7 so some improvement from December.  Continue current regimen.  Happy with current dose.  Follow-up in 3 to 4 months.         Meds ordered this encounter  Medications  . nystatin (MYCOSTATIN/NYSTOP) powder    Sig: Apply 1 gram to affected area 2 times daily.    Dispense:  60 g    Refill:  11  . metFORMIN (GLUCOPHAGE) 500 MG tablet    Sig: Take 1 tablet (500 mg total) by  mouth daily with breakfast.    Dispense:  1 tablet    Refill:  0  . gabapentin (NEURONTIN) 300 MG capsule    Sig: Take 1 capsule (300 mg total) by mouth at bedtime. One tab PO qHS for a week, then BID for a week, then TID. May double weekly to a max of 3,600mg /day    Dispense:  90 capsule    Refill:  3    Follow-up: Return in about 4 months (around 10/20/2019) for Hypertension, Diabetes follow-up.    Nani Gasser, MD

## 2019-06-22 NOTE — Assessment & Plan Note (Addendum)
HQ 9 score of 4 today previous of 7 so some improvement from December.  Continue current regimen.  Happy with current dose.  Follow-up in 3 to 4 months.

## 2019-06-22 NOTE — Assessment & Plan Note (Signed)
GAD-7 score of 5 today, previous of 5.  Rates a somewhat difficult.

## 2019-06-25 ENCOUNTER — Other Ambulatory Visit: Payer: Self-pay | Admitting: Family Medicine

## 2019-06-28 ENCOUNTER — Ambulatory Visit (INDEPENDENT_AMBULATORY_CARE_PROVIDER_SITE_OTHER): Payer: Medicare Other | Admitting: Family Medicine

## 2019-06-28 ENCOUNTER — Encounter: Payer: Self-pay | Admitting: Family Medicine

## 2019-06-28 VITALS — BP 144/53 | HR 54 | Ht 62.0 in | Wt 200.0 lb

## 2019-06-28 DIAGNOSIS — W19XXXA Unspecified fall, initial encounter: Secondary | ICD-10-CM

## 2019-06-28 DIAGNOSIS — S40022A Contusion of left upper arm, initial encounter: Secondary | ICD-10-CM

## 2019-06-28 DIAGNOSIS — M79602 Pain in left arm: Secondary | ICD-10-CM

## 2019-06-28 DIAGNOSIS — E118 Type 2 diabetes mellitus with unspecified complications: Secondary | ICD-10-CM

## 2019-06-28 DIAGNOSIS — N644 Mastodynia: Secondary | ICD-10-CM

## 2019-06-28 DIAGNOSIS — S2002XA Contusion of left breast, initial encounter: Secondary | ICD-10-CM

## 2019-06-28 DIAGNOSIS — S46812A Strain of other muscles, fascia and tendons at shoulder and upper arm level, left arm, initial encounter: Secondary | ICD-10-CM

## 2019-06-28 DIAGNOSIS — Y92009 Unspecified place in unspecified non-institutional (private) residence as the place of occurrence of the external cause: Secondary | ICD-10-CM

## 2019-06-28 NOTE — Progress Notes (Signed)
She reports that on Saturday she was filling her dog's food bowl and she bent forward and began to fall and landed on her L side. She has bruising on her L breast and arm. She also c/o L sided neck soreness.  The areas are very tender and sore. She has been using a heating pad on her neck which helps some. She also states that having swelling bilaterally in her feet.   She reports that she had a really bad headache the following day this has subsided. Denies any visual changes.

## 2019-06-28 NOTE — Progress Notes (Signed)
Acute Office Visit  Subjective:    Patient ID: Joann Maddox, female    DOB: 1940/12/05, 79 y.o.   MRN: [id number]  Chief Complaint  Patient presents with  . Fall    HPI Patient is in today for She reports that on Saturday she was filling her dog's food bowl and she bent forward and began to fall and landed on her L side. She has bruising on her L breast and arm. She also c/o L sided neck soreness.  The areas are very tender and sore. She has been using a heating pad on her neck which helps some. She also states that having swelling bilaterally in her feet. No head injury.  She did have a bad HA the day after she fell but says that has resolved.  She hasn't tried ice etc. no chest pain with coughing or sneezing.  She reports that she had a really bad headache the following day this has subsided. Denies any visual changes.   Past Medical History:  Diagnosis Date  . Anxiety   . Arthritis   . Depression   . Headache(784.0)    migrains  . Heart murmur   . Hypothyroidism   . Pre-diabetes   . Rheumatoid arthritis(714.0)     Past Surgical History:  Procedure Laterality Date  . ABDOMINAL HYSTERECTOMY    . ANTERIOR CERVICAL DECOMP/DISCECTOMY FUSION N/A 01/03/2018   Procedure: ANTERIOR CERVICAL DECOMPRESSION/DISCECTOMY FUSION, ALLOGRAFT, PLATE;  Surgeon: Eldred Manges, MD;  Location: [facility] OR;  Service: Orthopedics;  Laterality: N/A;  . APPENDECTOMY    . CHOLECYSTECTOMY    . REPLACEMENT TOTAL KNEE BILATERAL    . TONSILLECTOMY     as  a child  . TOTAL HIP ARTHROPLASTY     right and left    Family History  Problem Relation Age of Onset  . Heart disease Father 62  . Diabetes Other        aunt   . Stroke Mother 87    Social History   Socioeconomic History  . Marital status: Married    Spouse name: Dorene Sorrow  . Number of children: 2  . Years of education: Not on file  . Highest education level: Not on file  Occupational History  . Occupation: Retired Runner, broadcasting/film/video    Comment: Actuary  Tobacco Use  . Smoking status: Never Smoker  . Smokeless tobacco: Never Used  Substance and Sexual Activity  . Alcohol use: No  . Drug use: Not on file  . Sexual activity: Not on file  Other Topics Concern  . Not on file  Social History Narrative   No regular exercise. 2 caffeine drinks per day.    Social Determinants of Health   Financial Resource Strain:   . Difficulty of Paying Living Expenses: Not on file  Food Insecurity:   . Worried About Programme researcher, broadcasting/film/video in the Last Year: Not on file  . Ran Out of Food in the Last Year: Not on file  Transportation Needs:   . Lack of Transportation (Medical): Not on file  . Lack of Transportation (Non-Medical): Not on file  Physical Activity:   . Days of Exercise per Week: Not on file  . Minutes of Exercise per Session: Not on file  Stress:   . Feeling of Stress : Not on file  Social Connections:   . Frequency of Communication with Friends and Family: Not on file  . Frequency of Social Gatherings with Friends and Family:  Not on file  . Attends Religious Services: Not on file  . Active Member of Clubs or Organizations: Not on file  . Attends Banker Meetings: Not on file  . Marital Status: Not on file  Intimate Partner Violence:   . Fear of Current or Ex-Partner: Not on file  . Emotionally Abused: Not on file  . Physically Abused: Not on file  . Sexually Abused: Not on file    Outpatient Medications Prior to Visit  Medication Sig Dispense Refill  . azelaic acid (AZELEX) 20 % cream APPLY TOPICALL 2 TIMES A DAY (MORNING AND EVENING). APPLY AFTER SKIN IS WASHED AND PATTED DRY. 30 g 6  . butalbital-acetaminophen-caffeine (FIORICET) 50-325-40 MG tablet Take 1 tablet by mouth 2 (two) times daily as needed. 14 tablet 5  . diclofenac sodium (VOLTAREN) 1 % GEL Apply 2 g topically 4 (four) times daily. To affected joint. 100 g 11  . FLUoxetine (PROZAC) 20 MG tablet 1/2 tab po QD x 8 days the increase to whole tab  daily. (Patient taking differently: Take 20 mg by mouth daily. ) 30 tablet 0  . folic acid (FOLVITE) 1 MG tablet TAKE 1 TABLET BY MOUTH EVERY DAY 90 tablet 3  . furosemide (LASIX) 20 MG tablet TAKE 1 TABLET BY MOUTH EVERY DAY AS NEEDED 30 tablet 0  . gabapentin (NEURONTIN) 300 MG capsule Take 1 capsule (300 mg total) by mouth at bedtime. One tab PO qHS for a week, then BID for a week, then TID. May double weekly to a max of 3,600mg /day 90 capsule 3  . hydroxychloroquine (PLAQUENIL) 200 MG tablet TALE 2 TABLETS BY MOUTH WITH FOOD OR MILK ONCE A DAY 180 tablet 2  . levothyroxine (SYNTHROID) 25 MCG tablet TAKE 1 TABLET BY MOUTH EVERY DAY 20 tablet 13  . losartan (COZAAR) 100 MG tablet TAKE ONE TAB BY MOUTH DAILY 24 tablet 11  . meloxicam (MOBIC) 15 MG tablet TAKE ONE TABLET BY MOUTH EACH AM WITH BREAKFAST AS NEEDED FOR PAIN 30 tablet 2  . metFORMIN (GLUCOPHAGE) 500 MG tablet Take 1 tablet (500 mg total) by mouth daily with breakfast. 1 tablet 0  . metoprolol succinate (TOPROL-XL) 25 MG 24 hr tablet TAKE 1 TABLET BY MOUTH EVERYDAY AT BEDTIME 90 tablet 1  . Multiple Vitamin (MULTIVITAMIN) tablet Take 1 tablet by mouth daily.    Marland Kitchen MYRBETRIQ 25 MG TB24 tablet TAKE 1 TABLET BY MOUTH EVERYDAY AT BEDTIME. 30 tablet 5  . nystatin (MYCOSTATIN/NYSTOP) powder Apply 1 gram to affected area 2 times daily. 60 g 11  . potassium chloride (K-DUR) 10 MEQ tablet TAKE 1 TABLET (10 MEQ TOTAL) BY MOUTH 2 (TWO) TIMES DAILY. 60 tablet 11  . rosuvastatin (CRESTOR) 10 MG tablet TAKE 1 TABLET BY MOUTH EVERYDAY AT BEDTIME 30 tablet 11  . senna (SENOKOT) 8.6 MG tablet Take 1 tablet by mouth at bedtime.     . traMADol (ULTRAM) 50 MG tablet Take 1-2 tablets (50-100 mg total) by mouth 3 (three) times daily. 180 tablet 0  . TURMERIC PO Take 1,000 mg by mouth daily.    . vitamin B-12 (CYANOCOBALAMIN) 100 MCG tablet Take 100 mcg by mouth at bedtime.     . Vitamin D, Ergocalciferol, (DRISDOL) 1.25 MG (50000 UT) CAPS capsule TAKE 1  CAPSULE (50,000 UNITS TOTAL) BY MOUTH EVERY SUNDAY. 12 capsule 2   No facility-administered medications prior to visit.    Allergies  Allergen Reactions  . Iodine Other (See Comments)  Shook violently and passed out.  . Codeine Other (See Comments)    Hallucinations  . Lipitor [Atorvastatin] Other (See Comments)    Myalgias    Review of Systems     Objective:    Physical Exam Vitals reviewed.  Constitutional:      Appearance: She is well-developed.  HENT:     Head: Normocephalic and atraumatic.  Eyes:     Conjunctiva/sclera: Conjunctivae normal.  Cardiovascular:     Rate and Rhythm: Normal rate.  Pulmonary:     Effort: Pulmonary effort is normal.  Musculoskeletal:     Comments: Normal cervical flexion.  Decreased extension.  Significantly decreased rotation right and left.  She had pain with rotation to the left.  And decreased sidebending right and left.  She is tender over the left trapezius muscle nontender of the cervical spine.  And nontender over the clavicle and shoulder joint itself.  She has a large bruise over her upper outer arm extending down to her elbow.  She has decreased extension of her elbow to about 120 degrees but it is fairly symmetric compared to her right elbow.  She is only able to extend her left shoulder to 90 degrees but this is unchanged from previous.  Left wrist with normal range of motion and no bruising.  Radial pulse 2+.  She has significant bruising over most of her left side of her breast.  No chest wall/rib tenderness on exam.  Skin:    General: Skin is dry.     Coloration: Skin is not pale.  Neurological:     Mental Status: She is alert and oriented to person, place, and time.  Psychiatric:        Behavior: Behavior normal.     BP (!) 144/53   Pulse (!) 54   Ht 5\' 2"  (1.575 m)   Wt 200 lb (90.7 kg)   SpO2 94%   BMI 36.58 kg/m  Wt Readings from Last 3 Encounters:  06/28/19 200 lb (90.7 kg)  06/22/19 200 lb (90.7 kg)  05/02/19  197 lb (89.4 kg)    There are no preventive care reminders to display for this patient.  There are no preventive care reminders to display for this patient.   Lab Results  Component Value Date   TSH 0.48 08/22/2018   Lab Results  Component Value Date   WBC 6.3 08/22/2018   HGB 11.0 (L) 08/22/2018   HCT 33.3 (L) 08/22/2018   MCV 86.7 08/22/2018   PLT 264 08/22/2018   Lab Results  Component Value Date   NA 142 08/22/2018   K 4.3 08/22/2018   CO2 30 08/22/2018   GLUCOSE 105 (H) 08/22/2018   BUN 21 08/22/2018   CREATININE 0.67 08/22/2018   BILITOT 0.5 08/22/2018   ALKPHOS 66 12/23/2017   AST 22 08/22/2018   ALT 15 08/22/2018   PROT 6.3 08/22/2018   ALBUMIN 3.7 12/23/2017   CALCIUM 9.7 08/22/2018   ANIONGAP 10 01/04/2018   Lab Results  Component Value Date   CHOL 128 12/03/2017   Lab Results  Component Value Date   HDL 77 12/03/2017   Lab Results  Component Value Date   LDLCALC 35 12/03/2017   Lab Results  Component Value Date   TRIG 75 12/03/2017   Lab Results  Component Value Date   CHOLHDL 1.7 12/03/2017   Lab Results  Component Value Date   HGBA1C 5.4 06/22/2019       Assessment & Plan:  Problem List Items Addressed This Visit      Endocrine   Controlled diabetes mellitus type 2 with complications [facility]) - Primary   Relevant Orders   Ambulatory referral to Ophthalmology    Other Visit Diagnoses    Left arm pain       Breast pain, left       Fall in home, initial encounter       Contusion of left breast, initial encounter       Contusion of left upper extremity, initial encounter       Strain of left trapezius muscle, initial encounter         Left arm pain and contusion-recommend icing.  No sign of fracture on exam.  Work on gentle range of motion.  If not improving or develops any new symptoms then please let me know.  Left breast contusion/pain-no sign of chest wall injury or rib fracture.  Recommend icing aggressively.  Fall in  the home-she had bent forward to feed her dog and lost her balance and continued to fall.  She has had several falls in a short period of time.  Left trapezius strain-work on gentle range of motion stretches given to take home to do on her own.  Declined more formal physical therapy.  Okay to use her tramadol as needed.  And continue to use heat since that does seem to provide some temporary relief for her.  She is very tender on exam but no bruising around the shoulder or neck.   No orders of the defined types were placed in this encounter.    Nani Gasser, MD

## 2019-06-29 ENCOUNTER — Ambulatory Visit: Payer: Medicare Other | Admitting: Family Medicine

## 2019-07-02 ENCOUNTER — Other Ambulatory Visit: Payer: Self-pay | Admitting: Family Medicine

## 2019-07-05 ENCOUNTER — Ambulatory Visit: Payer: Medicare Other | Admitting: Orthopaedic Surgery

## 2019-07-21 ENCOUNTER — Other Ambulatory Visit: Payer: Self-pay | Admitting: Family Medicine

## 2019-07-29 ENCOUNTER — Other Ambulatory Visit: Payer: Self-pay | Admitting: Sports Medicine

## 2019-07-29 DIAGNOSIS — M19012 Primary osteoarthritis, left shoulder: Secondary | ICD-10-CM

## 2019-08-18 ENCOUNTER — Other Ambulatory Visit: Payer: Self-pay

## 2019-08-18 ENCOUNTER — Ambulatory Visit (INDEPENDENT_AMBULATORY_CARE_PROVIDER_SITE_OTHER): Payer: Medicare Other | Admitting: Family Medicine

## 2019-08-18 VITALS — BP 166/40 | HR 54 | Ht 62.0 in | Wt 203.0 lb

## 2019-08-18 DIAGNOSIS — R600 Localized edema: Secondary | ICD-10-CM | POA: Diagnosis not present

## 2019-08-18 DIAGNOSIS — I890 Lymphedema, not elsewhere classified: Secondary | ICD-10-CM

## 2019-08-18 DIAGNOSIS — R0600 Dyspnea, unspecified: Secondary | ICD-10-CM | POA: Diagnosis not present

## 2019-08-18 NOTE — Progress Notes (Signed)
Established Patient Office Visit  Subjective:  Patient ID: Joann Maddox, female    DOB: [birth year]  Age: 79 y.o. MRN: [id number]  CC:  Chief Complaint  Patient presents with  . Edema    HPI Joann Maddox presents for f/u LE swelling.  She says she really does not like taking the furosemide because after she takes it for a few days she just feels extremely weak.  She says she has been taking her potassium she has not been skipping that in fact she takes it every day whether or not she takes her furosemide.  She does feel like the furosemide works and that she does notice a significant increase in urination when she takes it.  But just starts to feel weak afterwards.  She feels like her lower extremity swelling has actually gotten worse in the last few weeks.  He says she noticed that she is getting more short of breath with activity.  She said she walked to the restaurant last night and by the time she sat down she was huffing and puffing.  Is noticed recently she started to get a little bit more shortness of breath with activities.  No prior history of heart failure.  Based on her recent weight she is up anywhere between 3 to 6 pounds which is most likely fluid.  Does try to eat a low-salt diet.  Past Medical History:  Diagnosis Date  . Anxiety   . Arthritis   . Depression   . Headache(784.0)    migrains  . Heart murmur   . Hypothyroidism   . Pre-diabetes   . Rheumatoid arthritis(714.0)     Past Surgical History:  Procedure Laterality Date  . ABDOMINAL HYSTERECTOMY    . ANTERIOR CERVICAL DECOMP/DISCECTOMY FUSION N/A 01/03/2018   Procedure: ANTERIOR CERVICAL DECOMPRESSION/DISCECTOMY FUSION, ALLOGRAFT, PLATE;  Surgeon: Marybelle Killings, MD;  Location: [facility];  Service: Orthopedics;  Laterality: N/A;  . APPENDECTOMY    . CHOLECYSTECTOMY    . REPLACEMENT TOTAL KNEE BILATERAL    . TONSILLECTOMY     as  a child  . TOTAL HIP ARTHROPLASTY     right and left    Family History   Problem Relation Age of Onset  . Heart disease Father 9  . Diabetes Other        aunt   . Stroke Mother 62    Social History   Socioeconomic History  . Marital status: Married    Spouse name: Sonia Side  . Number of children: 2  . Years of education: Not on file  . Highest education level: Not on file  Occupational History  . Occupation: Retired Pharmacist, hospital    Comment: Engineering geologist  Tobacco Use  . Smoking status: Never Smoker  . Smokeless tobacco: Never Used  Substance and Sexual Activity  . Alcohol use: No  . Drug use: Not on file  . Sexual activity: Not on file  Other Topics Concern  . Not on file  Social History Narrative   No regular exercise. 2 caffeine drinks per day.    Social Determinants of Health   Financial Resource Strain:   . Difficulty of Paying Living Expenses:   Food Insecurity:   . Worried About Charity fundraiser in the Last Year:   . Arboriculturist in the Last Year:   Transportation Needs:   . Film/video editor (Medical):   Marland Kitchen Lack of Transportation (Non-Medical):   Physical Activity:   .  Days of Exercise per Week:   . Minutes of Exercise per Session:   Stress:   . Feeling of Stress :   Social Connections:   . Frequency of Communication with Friends and Family:   . Frequency of Social Gatherings with Friends and Family:   . Attends Religious Services:   . Active Member of Clubs or Organizations:   . Attends Banker Meetings:   Marland Kitchen Marital Status:   Intimate Partner Violence:   . Fear of Current or Ex-Partner:   . Emotionally Abused:   Marland Kitchen Physically Abused:   . Sexually Abused:     Outpatient Medications Prior to Visit  Medication Sig Dispense Refill  . azelaic acid (AZELEX) 20 % cream APPLY TOPICALL 2 TIMES A DAY (MORNING AND EVENING). APPLY AFTER SKIN IS WASHED AND PATTED DRY. 30 g 6  . butalbital-acetaminophen-caffeine (FIORICET) 50-325-40 MG tablet Take 1 tablet by mouth 2 (two) times daily as needed. 14 tablet 5  .  diclofenac sodium (VOLTAREN) 1 % GEL Apply 2 g topically 4 (four) times daily. To affected joint. 100 g 11  . FLUoxetine (PROZAC) 20 MG tablet 1/2 tab po QD x 8 days the increase to whole tab daily. (Patient taking differently: Take 20 mg by mouth daily. ) 30 tablet 0  . folic acid (FOLVITE) 1 MG tablet TAKE 1 TABLET BY MOUTH EVERY DAY 90 tablet 3  . gabapentin (NEURONTIN) 300 MG capsule Take 1 capsule (300 mg total) by mouth at bedtime. One tab PO qHS for a week, then BID for a week, then TID. May double weekly to a max of 3,600mg /day 90 capsule 3  . hydroxychloroquine (PLAQUENIL) 200 MG tablet TALE 2 TABLETS BY MOUTH WITH FOOD OR MILK ONCE A DAY 180 tablet 2  . levothyroxine (SYNTHROID) 25 MCG tablet TAKE 1 TABLET BY MOUTH EVERY DAY 20 tablet 13  . losartan (COZAAR) 100 MG tablet TAKE ONE TAB BY MOUTH DAILY 24 tablet 11  . metFORMIN (GLUCOPHAGE) 500 MG tablet Take 1 tablet (500 mg total) by mouth daily with breakfast. 1 tablet 0  . metoprolol succinate (TOPROL-XL) 25 MG 24 hr tablet TAKE 1 TABLET BY MOUTH EVERYDAY AT BEDTIME 90 tablet 1  . Multiple Vitamin (MULTIVITAMIN) tablet Take 1 tablet by mouth daily.    Marland Kitchen MYRBETRIQ 25 MG TB24 tablet TAKE 1 TABLET BY MOUTH EVERYDAY AT BEDTIME 30 tablet 5  . nystatin (MYCOSTATIN/NYSTOP) powder Apply 1 gram to affected area 2 times daily. 60 g 11  . potassium chloride (K-DUR) 10 MEQ tablet TAKE 1 TABLET (10 MEQ TOTAL) BY MOUTH 2 (TWO) TIMES DAILY. 60 tablet 11  . rosuvastatin (CRESTOR) 10 MG tablet TAKE 1 TABLET BY MOUTH EVERYDAY AT BEDTIME 30 tablet 11  . senna (SENOKOT) 8.6 MG tablet Take 1 tablet by mouth at bedtime.     . traMADol (ULTRAM) 50 MG tablet TAKE 1-2 TABLETS (50-100 MG TOTAL) BY MOUTH 3 (THREE) TIMES DAILY. 180 tablet 0  . TURMERIC PO Take 1,000 mg by mouth daily.    . vitamin B-12 (CYANOCOBALAMIN) 100 MCG tablet Take 100 mcg by mouth at bedtime.     . Vitamin D, Ergocalciferol, (DRISDOL) 1.25 MG (50000 UT) CAPS capsule TAKE 1 CAPSULE (50,000  UNITS TOTAL) BY MOUTH EVERY SUNDAY. 12 capsule 2  . meloxicam (MOBIC) 15 MG tablet TAKE ONE TABLET BY MOUTH EACH AM WITH BREAKFAST AS NEEDED FOR PAIN 30 tablet 2  . furosemide (LASIX) 20 MG tablet TAKE 1 TABLET BY MOUTH  EVERY DAY AS NEEDED (Patient not taking: Reported on 08/18/2019) 90 tablet 0   No facility-administered medications prior to visit.    Allergies  Allergen Reactions  . Iodine Other (See Comments)    Shook violently and passed out.  . Codeine Other (See Comments)    Hallucinations  . Lipitor [Atorvastatin] Other (See Comments)    Myalgias    ROS Review of Systems    Objective:    Physical Exam  Constitutional: She is oriented to person, place, and time. She appears well-developed and well-nourished.  HENT:  Head: Normocephalic and atraumatic.  Cardiovascular: Normal rate, regular rhythm and normal heart sounds.  Pulmonary/Chest: Effort normal and breath sounds normal.  Musculoskeletal:        General: Edema present.     Comments: Plus pitting edema in both feet.  Neurological: She is alert and oriented to person, place, and time.  Skin: Skin is warm and dry.  Psychiatric: She has a normal mood and affect. Her behavior is normal.    BP (!) 166/40   Pulse (!) 54   Ht 5\' 2"  (1.575 m)   Wt 203 lb (92.1 kg)   SpO2 100%   BMI 37.13 kg/m  Wt Readings from Last 3 Encounters:  08/18/19 203 lb (92.1 kg)  06/28/19 200 lb (90.7 kg)  06/22/19 200 lb (90.7 kg)     Health Maintenance Due  Topic Date Due  . OPHTHALMOLOGY EXAM  Never done  . COVID-19 Vaccine (1) Never done    There are no preventive care reminders to display for this patient.  Lab Results  Component Value Date   TSH 0.48 08/22/2018   Lab Results  Component Value Date   WBC 5.2 08/21/2019   HGB 11.9 08/21/2019   HCT 35.9 08/21/2019   MCV 90.0 08/21/2019   PLT 204 08/21/2019   Lab Results  Component Value Date   NA 139 08/21/2019   K 4.2 08/21/2019   CO2 28 08/21/2019   GLUCOSE  86 08/21/2019   BUN 23 08/21/2019   CREATININE 0.68 08/21/2019   BILITOT 0.7 08/21/2019   ALKPHOS 66 12/23/2017   AST 20 08/21/2019   ALT 14 08/21/2019   PROT 6.3 08/21/2019   ALBUMIN 3.7 12/23/2017   CALCIUM 9.3 08/21/2019   ANIONGAP 10 01/04/2018   Lab Results  Component Value Date   CHOL 141 08/21/2019   Lab Results  Component Value Date   HDL 80 08/21/2019   Lab Results  Component Value Date   LDLCALC 47 08/21/2019   Lab Results  Component Value Date   TRIG 56 08/21/2019   Lab Results  Component Value Date   CHOLHDL 1.8 08/21/2019   Lab Results  Component Value Date   HGBA1C 5.4 06/22/2019      Assessment & Plan:   Problem List Items Addressed This Visit    None    Visit Diagnoses    Dyspnea, unspecified type    -  Primary   Relevant Orders   ECHOCARDIOGRAM COMPLETE   Lymphedema       Lower extremity edema         Extremity edema-I am concerned that her weight is up about 6 pounds.  Discussed increasing her diuretic at least for 2 days in a row and then alternating.  To try to get some of the excess fluid off.  We discussed the possibility of even switching diuretics since she feels like this 1 makes her feel more tired.  Also  discussed alternating the furosemide every other day after that instead of just taking it as needed.  Also discussed limiting her fluid intake to no more than 60 ounces and limiting sodium intake as well.  Also encouraged her to keep daily weights and bring that log in with her when she follows up in 2 weeks.  SOB-again probably secondary to volume overload but I would like to get an up-to-date echocardiogram to make sure that there is no new onset of heart failure.  Lymph Edema-I still want a get her in with the lymphedema clinic.  No orders of the defined types were placed in this encounter.   Follow-up: Return in about 2 weeks (around 09/01/2019).    Nani Gasser, MD

## 2019-08-18 NOTE — Patient Instructions (Addendum)
Please take your furosemide every other day in the morning.   Limit your fluid intake to no more than 60 ounces per day. Limit your sodium intake You can work on getting you scheduled for an echocardiogram for your heart. Bring in your daily weight log with you when you come in.

## 2019-08-19 ENCOUNTER — Other Ambulatory Visit: Payer: Self-pay | Admitting: Family Medicine

## 2019-08-21 ENCOUNTER — Encounter: Payer: Self-pay | Admitting: Family Medicine

## 2019-08-22 ENCOUNTER — Other Ambulatory Visit: Payer: Self-pay | Admitting: Family Medicine

## 2019-08-22 LAB — IRON,TIBC AND FERRITIN PANEL
%SAT: 23 % (calc) (ref 16–45)
Ferritin: 27 ng/mL (ref 16–288)
Iron: 78 ug/dL (ref 45–160)
TIBC: 342 mcg/dL (calc) (ref 250–450)

## 2019-08-22 LAB — COMPLETE METABOLIC PANEL WITH GFR
AG Ratio: 1.9 (calc) (ref 1.0–2.5)
ALT: 14 U/L (ref 6–29)
AST: 20 U/L (ref 10–35)
Albumin: 4.1 g/dL (ref 3.6–5.1)
Alkaline phosphatase (APISO): 79 U/L (ref 37–153)
BUN: 23 mg/dL (ref 7–25)
CO2: 28 mmol/L (ref 20–32)
Calcium: 9.3 mg/dL (ref 8.6–10.4)
Chloride: 106 mmol/L (ref 98–110)
Creat: 0.68 mg/dL (ref 0.60–0.93)
GFR, Est African American: 98 mL/min/{1.73_m2} (ref >=60)
GFR, Est Non African American: 84 mL/min/{1.73_m2} (ref >=60)
Globulin: 2.2 g/dL (calc) (ref 1.9–3.7)
Glucose, Bld: 86 mg/dL (ref 65–99)
Potassium: 4.2 mmol/L (ref 3.5–5.3)
Sodium: 139 mmol/L (ref 135–146)
Total Bilirubin: 0.7 mg/dL (ref 0.2–1.2)
Total Protein: 6.3 g/dL (ref 6.1–8.1)

## 2019-08-22 LAB — CBC
HCT: 35.9 % (ref 35.0–45.0)
Hemoglobin: 11.9 g/dL (ref 11.7–15.5)
MCH: 29.8 pg (ref 27.0–33.0)
MCHC: 33.1 g/dL (ref 32.0–36.0)
MCV: 90 fL (ref 80.0–100.0)
MPV: 9.8 fL (ref 7.5–12.5)
Platelets: 204 10*3/uL (ref 140–400)
RBC: 3.99 10*6/uL (ref 3.80–5.10)
RDW: 12.6 % (ref 11.0–15.0)
WBC: 5.2 10*3/uL (ref 3.8–10.8)

## 2019-08-22 LAB — LIPID PANEL
Cholesterol: 141 mg/dL (ref <200)
HDL: 80 mg/dL (ref >=50)
LDL Cholesterol (Calc): 47 mg/dL (calc)
Non-HDL Cholesterol (Calc): 61 mg/dL (calc) (ref <130)
Total CHOL/HDL Ratio: 1.8 (calc) (ref <5.0)
Triglycerides: 56 mg/dL (ref <150)

## 2019-08-24 ENCOUNTER — Other Ambulatory Visit: Payer: Self-pay | Admitting: Family Medicine

## 2019-08-25 ENCOUNTER — Ambulatory Visit (HOSPITAL_BASED_OUTPATIENT_CLINIC_OR_DEPARTMENT_OTHER): Admission: RE | Admit: 2019-08-25 | Payer: Medicare Other | Source: Ambulatory Visit

## 2019-08-31 ENCOUNTER — Other Ambulatory Visit: Payer: Self-pay | Admitting: Family Medicine

## 2019-09-01 ENCOUNTER — Other Ambulatory Visit: Payer: Self-pay

## 2019-09-01 ENCOUNTER — Ambulatory Visit (HOSPITAL_BASED_OUTPATIENT_CLINIC_OR_DEPARTMENT_OTHER)
Admission: RE | Admit: 2019-09-01 | Discharge: 2019-09-01 | Disposition: A | Discharge status: Home / Self Care | Payer: Medicare Other | Source: Ambulatory Visit | Attending: Family Medicine | Admitting: Family Medicine

## 2019-09-01 ENCOUNTER — Encounter: Payer: Self-pay | Admitting: Family Medicine

## 2019-09-01 ENCOUNTER — Ambulatory Visit (INDEPENDENT_AMBULATORY_CARE_PROVIDER_SITE_OTHER): Payer: Medicare Other | Admitting: Family Medicine

## 2019-09-01 VITALS — BP 145/45 | HR 63 | Ht 62.0 in | Wt 206.0 lb

## 2019-09-01 DIAGNOSIS — I10 Essential (primary) hypertension: Secondary | ICD-10-CM

## 2019-09-01 DIAGNOSIS — R600 Localized edema: Secondary | ICD-10-CM | POA: Diagnosis not present

## 2019-09-01 DIAGNOSIS — I890 Lymphedema, not elsewhere classified: Secondary | ICD-10-CM | POA: Diagnosis not present

## 2019-09-01 DIAGNOSIS — R0600 Dyspnea, unspecified: Secondary | ICD-10-CM | POA: Insufficient documentation

## 2019-09-01 MED ORDER — BUMETANIDE 1 MG PO TABS: 1.0000 mg | ORAL_TABLET | Freq: Every morning | ORAL | 0 refills | Status: DC

## 2019-09-01 MED ORDER — AZELEX 20 % EX CREA: TOPICAL_CREAM | CUTANEOUS | 6 refills | Status: DC

## 2019-09-01 NOTE — Progress Notes (Signed)
Established Patient Office Visit  Subjective:  Patient ID: Joann Maddox, female    DOB: 09-23-1940  Age: 79 y.o. MRN: [id number]  CC:  Chief Complaint  Patient presents with  . Edema    HPI Joann Maddox presents for Follow-up extremity edema I was very concerned last time I saw her about 2 weeks ago because she was up 6 pounds.  She had not been taking her diuretic regularly because she said it would make her feel weak if she would take it a couple days in a row so we discussed alternating her furosemide to every other day instead of just taking it as needed and not feeling good.  To see if she felt like that was helping more consistently to keep the fluid off but not making her feel weak or tired.  We also discussed the option of switching diuretics completely.  She said she when she would take the diuretic she would go down about 2 to 3 pounds and then she would gain it back the next day.  She says it did not make her feel super bad when she would take it like before when she was taking it multiple days in a row but says she still in general does not like how it makes her feel.  She has not taken it for the last 2 days because she was out and about.  Feeling down and depressed because of some family stressors and health issues.  But she declines to see a therapist or counselor.  Past Medical History:  Diagnosis Date  . Anxiety   . Arthritis   . Depression   . Headache(784.0)    migrains  . Heart murmur   . Hypothyroidism   . Pre-diabetes   . Rheumatoid arthritis(714.0)     Past Surgical History:  Procedure Laterality Date  . ABDOMINAL HYSTERECTOMY    . ANTERIOR CERVICAL DECOMP/DISCECTOMY FUSION N/A 01/03/2018   Procedure: ANTERIOR CERVICAL DECOMPRESSION/DISCECTOMY FUSION, ALLOGRAFT, PLATE;  Surgeon: Marybelle Killings, MD;  Location: [facility];  Service: Orthopedics;  Laterality: N/A;  . APPENDECTOMY    . CHOLECYSTECTOMY    . REPLACEMENT TOTAL KNEE BILATERAL    . TONSILLECTOMY     as  a child  . TOTAL HIP ARTHROPLASTY     right and left    Family History  Problem Relation Age of Onset  . Heart disease Father 41  . Diabetes Other        aunt   . Stroke Mother 19    Social History   Socioeconomic History  . Marital status: Married    Spouse name: Sonia Side  . Number of children: 2  . Years of education: Not on file  . Highest education level: Not on file  Occupational History  . Occupation: Retired Pharmacist, hospital    Comment: Engineering geologist  Tobacco Use  . Smoking status: Never Smoker  . Smokeless tobacco: Never Used  Substance and Sexual Activity  . Alcohol use: No  . Drug use: Not on file  . Sexual activity: Not on file  Other Topics Concern  . Not on file  Social History Narrative   No regular exercise. 2 caffeine drinks per day.    Social Determinants of Health   Financial Resource Strain:   . Difficulty of Paying Living Expenses:   Food Insecurity:   . Worried About Charity fundraiser in the Last Year:   . [place] in the Last Year:  Transportation Needs:   . Freight forwarder (Medical):   Marland Kitchen Lack of Transportation (Non-Medical):   Physical Activity:   . Days of Exercise per Week:   . Minutes of Exercise per Session:   Stress:   . Feeling of Stress :   Social Connections:   . Frequency of Communication with Friends and Family:   . Frequency of Social Gatherings with Friends and Family:   . Attends Religious Services:   . Active Member of Clubs or Organizations:   . Attends Banker Meetings:   Marland Kitchen Marital Status:   Intimate Partner Violence:   . Fear of Current or Ex-Partner:   . Emotionally Abused:   Marland Kitchen Physically Abused:   . Sexually Abused:     Outpatient Medications Prior to Visit  Medication Sig Dispense Refill  . butalbital-acetaminophen-caffeine (FIORICET) 50-325-40 MG tablet Take 1 tablet by mouth 2 (two) times daily as needed. 14 tablet 5  . diclofenac sodium (VOLTAREN) 1 % GEL Apply 2 g topically 4  (four) times daily. To affected joint. 100 g 11  . diclofenac Sodium (VOLTAREN) 1 % GEL     . doxepin (SINEQUAN) 10 MG/ML solution     . FLUoxetine (PROZAC) 20 MG tablet Take 1 tablet (20 mg total) by mouth daily. 90 tablet 1  . folic acid (FOLVITE) 1 MG tablet TAKE 1 TABLET BY MOUTH EVERY DAY 90 tablet 3  . gabapentin (NEURONTIN) 300 MG capsule Take 1 capsule (300 mg total) by mouth at bedtime. One tab PO qHS for a week, then BID for a week, then TID. May double weekly to a max of 3,600mg /day 90 capsule 3  . hydroxychloroquine (PLAQUENIL) 200 MG tablet TALE 2 TABLETS BY MOUTH WITH FOOD OR MILK ONCE A DAY 60 tablet 6  . levothyroxine (SYNTHROID) 25 MCG tablet TAKE 1 TABLET BY MOUTH EVERY DAY 20 tablet 13  . losartan (COZAAR) 100 MG tablet TAKE ONE TAB BY MOUTH DAILY 24 tablet 11  . meloxicam (MOBIC) 15 MG tablet TAKE ONE TABLET BY MOUTH EACH AM WITH BREAKFAST AS NEEDED FOR PAIN 30 tablet 2  . metFORMIN (GLUCOPHAGE) 500 MG tablet Take 1 tablet (500 mg total) by mouth daily with breakfast. 1 tablet 0  . metoprolol succinate (TOPROL-XL) 25 MG 24 hr tablet TAKE 1 TABLET BY MOUTH EVERYDAY AT BEDTIME 90 tablet 1  . Multiple Vitamin (MULTIVITAMIN) tablet Take 1 tablet by mouth daily.    Marland Kitchen MYRBETRIQ 25 MG TB24 tablet TAKE 1 TABLET BY MOUTH EVERYDAY AT BEDTIME 30 tablet 5  . nystatin (MYCOSTATIN/NYSTOP) powder Apply 1 gram to affected area 2 times daily. 60 g 11  . potassium chloride (K-DUR) 10 MEQ tablet TAKE 1 TABLET (10 MEQ TOTAL) BY MOUTH 2 (TWO) TIMES DAILY. 60 tablet 11  . rosuvastatin (CRESTOR) 10 MG tablet TAKE 1 TABLET BY MOUTH EVERYDAY AT BEDTIME 30 tablet 11  . senna (SENOKOT) 8.6 MG tablet Take 1 tablet by mouth at bedtime.     . traMADol (ULTRAM) 50 MG tablet TAKE 1-2 TABLETS (50-100 MG TOTAL) BY MOUTH 3 (THREE) TIMES DAILY. 180 tablet 0  . TURMERIC PO Take 1,000 mg by mouth daily.    . vitamin B-12 (CYANOCOBALAMIN) 100 MCG tablet Take 100 mcg by mouth at bedtime.     . Vitamin D,  Ergocalciferol, (DRISDOL) 1.25 MG (50000 UT) CAPS capsule TAKE 1 CAPSULE (50,000 UNITS TOTAL) BY MOUTH EVERY SUNDAY. 12 capsule 2  . azelaic acid (AZELEX) 20 % cream APPLY TOPICALL  2 TIMES A DAY (MORNING AND EVENING). APPLY AFTER SKIN IS WASHED AND PATTED DRY. 30 g 6  . furosemide (LASIX) 20 MG tablet TAKE 1 TABLET BY MOUTH EVERY DAY AS NEEDED 90 tablet 1   No facility-administered medications prior to visit.    Allergies  Allergen Reactions  . Iodine Other (See Comments)    Shook violently and passed out.  . Codeine Other (See Comments)    Hallucinations  . Lipitor [Atorvastatin] Other (See Comments)    Myalgias    ROS Review of Systems    Objective:    Physical Exam  BP (!) 145/45   Pulse 63   Ht 5\' 2"  (1.575 m)   Wt 206 lb (93.4 kg)   SpO2 96%   BMI 37.68 kg/m  Wt Readings from Last 3 Encounters:  09/01/19 206 lb (93.4 kg)  08/18/19 203 lb (92.1 kg)  06/28/19 200 lb (90.7 kg)     Health Maintenance Due  Topic Date Due  . OPHTHALMOLOGY EXAM  Never done    There are no preventive care reminders to display for this patient.  Lab Results  Component Value Date   TSH 0.48 08/22/2018   Lab Results  Component Value Date   WBC 5.2 08/21/2019   HGB 11.9 08/21/2019   HCT 35.9 08/21/2019   MCV 90.0 08/21/2019   PLT 204 08/21/2019   Lab Results  Component Value Date   NA 139 08/21/2019   K 4.2 08/21/2019   CO2 28 08/21/2019   GLUCOSE 86 08/21/2019   BUN 23 08/21/2019   CREATININE 0.68 08/21/2019   BILITOT 0.7 08/21/2019   ALKPHOS 66 12/23/2017   AST 20 08/21/2019   ALT 14 08/21/2019   PROT 6.3 08/21/2019   ALBUMIN 3.7 12/23/2017   CALCIUM 9.3 08/21/2019   ANIONGAP 10 01/04/2018   Lab Results  Component Value Date   CHOL 141 08/21/2019   Lab Results  Component Value Date   HDL 80 08/21/2019   Lab Results  Component Value Date   LDLCALC 47 08/21/2019   Lab Results  Component Value Date   TRIG 56 08/21/2019   Lab Results  Component Value  Date   CHOLHDL 1.8 08/21/2019   Lab Results  Component Value Date   HGBA1C 5.4 06/22/2019      Assessment & Plan:   Problem List Items Addressed This Visit      Cardiovascular and Mediastinum   Essential hypertension, benign - Primary    BP not well controlled. Hopefully once she is diuresed her BP will be better.       Relevant Medications   bumetanide (BUMEX) 1 MG tablet     Other   Lymph edema    Again encouraed compression stockings. Her husband said he is willing ot put them on her in the AMs      Bilateral lower extremity edema    Change to Bumex and take daily in AM. Monitor for fatigue. F/U in 1 week and will check labs. Weight daily and log.        Other Visit Diagnoses    Lymphedema       Relevant Medications   bumetanide (BUMEX) 1 MG tablet   Lower extremity edema       Relevant Medications   bumetanide (BUMEX) 1 MG tablet      Meds ordered this encounter  Medications  . azelaic acid (AZELEX) 20 % cream    Sig: APPLY TOPICALL 2 TIMES A DAY (MORNING  AND EVENING). APPLY AFTER SKIN IS WASHED AND PATTED DRY.    Dispense:  30 g    Refill:  6  . bumetanide (BUMEX) 1 MG tablet    Sig: Take 1 tablet (1 mg total) by mouth in the morning.    Dispense:  30 tablet    Refill:  0    Follow-up: Return in about 1 week (around 09/08/2019).    Nani Gasser, MD

## 2019-09-01 NOTE — Assessment & Plan Note (Signed)
Again encouraed compression stockings. Her husband said he is willing ot put them on her in the AMs

## 2019-09-01 NOTE — Assessment & Plan Note (Signed)
Change to Bumex and take daily in AM. Monitor for fatigue. F/U in 1 week and will check labs. Weight daily and log.

## 2019-09-01 NOTE — Progress Notes (Signed)
She reports that her home bp's have been   167/66 and 148/44. Her weight was 203 this morning.   She stated that because she was out of the house the past 2 days she last took the lasix on Wednesday.

## 2019-09-01 NOTE — Assessment & Plan Note (Signed)
BP not well controlled. Hopefully once she is diuresed her BP will be better.

## 2019-09-01 NOTE — Progress Notes (Signed)
  Echocardiogram 2D Echocardiogram has been performed.  Joann Maddox 09/01/2019, 10:59 AM

## 2019-09-08 ENCOUNTER — Ambulatory Visit (INDEPENDENT_AMBULATORY_CARE_PROVIDER_SITE_OTHER): Payer: Medicare Other | Admitting: Family Medicine

## 2019-09-08 ENCOUNTER — Encounter: Payer: Self-pay | Admitting: Family Medicine

## 2019-09-08 ENCOUNTER — Other Ambulatory Visit: Payer: Self-pay

## 2019-09-08 VITALS — BP 154/55 | HR 51 | Ht 62.0 in | Wt 200.0 lb

## 2019-09-08 DIAGNOSIS — I10 Essential (primary) hypertension: Secondary | ICD-10-CM | POA: Diagnosis not present

## 2019-09-08 DIAGNOSIS — R600 Localized edema: Secondary | ICD-10-CM | POA: Diagnosis not present

## 2019-09-08 DIAGNOSIS — I890 Lymphedema, not elsewhere classified: Secondary | ICD-10-CM | POA: Diagnosis not present

## 2019-09-08 LAB — BASIC METABOLIC PANEL WITH GFR
BUN: 21 mg/dL (ref 7–25)
CO2: 30 mmol/L (ref 20–32)
Calcium: 9.5 mg/dL (ref 8.6–10.4)
Chloride: 105 mmol/L (ref 98–110)
Creat: 0.7 mg/dL (ref 0.60–0.93)
GFR, Est African American: 97 mL/min/{1.73_m2} (ref >=60)
GFR, Est Non African American: 84 mL/min/{1.73_m2} (ref >=60)
Glucose, Bld: 78 mg/dL (ref 65–99)
Potassium: 4.1 mmol/L (ref 3.5–5.3)
Sodium: 142 mmol/L (ref 135–146)

## 2019-09-08 MED ORDER — AMLODIPINE BESYLATE 5 MG PO TABS
5.0000 mg | ORAL_TABLET | Freq: Every day | ORAL | 3 refills | Status: DC
Start: 2019-09-08 — End: 2019-12-31

## 2019-09-08 NOTE — Assessment & Plan Note (Signed)
Blood pressure is still high today.  And even at home she has been getting blood pressures in the 140s we discussed that based on her age still would like to get her blood pressure under 140 if at all possible.  She is already maxed out on her ACE inhibitor.  Metoprolol is at a low dose but her pulse is already in the 50s.  So we will try adding 5 mg of amlodipine need to monitor for increase in swelling.  Follow-up 3 to 4 weeks before she is due for refill.

## 2019-09-08 NOTE — Assessment & Plan Note (Signed)
Swelling actually does look better today still little greater on the left leg compared to the right left.  She actually does have compression stockings on so she is actually been getting the mom with the help of her husband and she has been trying to wear them which is fantastic.  She is actually down about 6 pounds on our scale so for now we will continue with Bumex but need to get an updated BMP just to make sure that her renal function looks okay as well as her potassium.

## 2019-09-08 NOTE — Progress Notes (Signed)
Established Patient Office Visit  Subjective:  Patient ID: Joann Maddox, female    DOB: [birth year]  Age: 79 y.o. MRN: [id number]  CC:  Chief Complaint  Patient presents with  . Hypertension    HPI [name] presents for extremity edema.  I decided to switch her to Bumex because she did not feel good taking the Lasix it would make her feel weak.  She is here today to follow-up to see if she is responding to the medication feeling well on it did ask her to bring in her daily weight log.  Extremely had one weight written down on her log but said she did check it and look like it was down about 3 pounds on her home scale.  She is actually down about 6 pounds on our scale here today.  HTN -she has been checking her blood pressures at home and did bring in her blood pressure log.  It looks like in the last couple days she has been getting some pressures in the 140s and 150s which is good.  She would like to go over her echo results today as well.    Past Medical History:  Diagnosis Date  . Anxiety   . Arthritis   . Depression   . Headache(784.0)    migrains  . Heart murmur   . Hypothyroidism   . Pre-diabetes   . Rheumatoid arthritis(714.0)     Past Surgical History:  Procedure Laterality Date  . ABDOMINAL HYSTERECTOMY    . ANTERIOR CERVICAL DECOMP/DISCECTOMY FUSION N/A 01/03/2018   Procedure: ANTERIOR CERVICAL DECOMPRESSION/DISCECTOMY FUSION, ALLOGRAFT, PLATE;  Surgeon: Eldred Manges, MD;  Location: [facility] OR;  Service: Orthopedics;  Laterality: N/A;  . APPENDECTOMY    . CHOLECYSTECTOMY    . REPLACEMENT TOTAL KNEE BILATERAL    . TONSILLECTOMY     as  a child  . TOTAL HIP ARTHROPLASTY     right and left    Family History  Problem Relation Age of Onset  . Heart disease Father 76  . Diabetes Other        aunt   . Stroke Mother 66    Social History   Socioeconomic History  . Marital status: Married    Spouse name: Dorene Sorrow  . Number of children: 2  . Years of  education: Not on file  . Highest education level: Not on file  Occupational History  . Occupation: Retired Runner, broadcasting/film/video    Comment: Corporate treasurer  Tobacco Use  . Smoking status: Never Smoker  . Smokeless tobacco: Never Used  Substance and Sexual Activity  . Alcohol use: No  . Drug use: Not on file  . Sexual activity: Not on file  Other Topics Concern  . Not on file  Social History Narrative   No regular exercise. 2 caffeine drinks per day.    Social Determinants of Health   Financial Resource Strain:   . Difficulty of Paying Living Expenses:   Food Insecurity:   . Worried About Programme researcher, broadcasting/film/video in the Last Year:   . Barista in the Last Year:   Transportation Needs:   . Freight forwarder (Medical):   Marland Kitchen Lack of Transportation (Non-Medical):   Physical Activity:   . Days of Exercise per Week:   . Minutes of Exercise per Session:   Stress:   . Feeling of Stress :   Social Connections:   . Frequency of Communication with Friends and Family:   .  Frequency of Social Gatherings with Friends and Family:   . Attends Religious Services:   . Active Member of Clubs or Organizations:   . Attends Banker Meetings:   Marland Kitchen Marital Status:   Intimate Partner Violence:   . Fear of Current or Ex-Partner:   . Emotionally Abused:   Marland Kitchen Physically Abused:   . Sexually Abused:     Outpatient Medications Prior to Visit  Medication Sig Dispense Refill  . azelaic acid (AZELEX) 20 % cream APPLY TOPICALL 2 TIMES A DAY (MORNING AND EVENING). APPLY AFTER SKIN IS WASHED AND PATTED DRY. 30 g 6  . bumetanide (BUMEX) 1 MG tablet Take 1 tablet (1 mg total) by mouth in the morning. 30 tablet 0  . butalbital-acetaminophen-caffeine (FIORICET) 50-325-40 MG tablet Take 1 tablet by mouth 2 (two) times daily as needed. 14 tablet 5  . diclofenac sodium (VOLTAREN) 1 % GEL Apply 2 g topically 4 (four) times daily. To affected joint. 100 g 11  . doxepin (SINEQUAN) 10 MG/ML solution     .  FLUoxetine (PROZAC) 20 MG tablet Take 1 tablet (20 mg total) by mouth daily. 90 tablet 1  . folic acid (FOLVITE) 1 MG tablet TAKE 1 TABLET BY MOUTH EVERY DAY 90 tablet 3  . gabapentin (NEURONTIN) 300 MG capsule Take 1 capsule (300 mg total) by mouth at bedtime. One tab PO qHS for a week, then BID for a week, then TID. May double weekly to a max of 3,600mg /day 90 capsule 3  . hydroxychloroquine (PLAQUENIL) 200 MG tablet TALE 2 TABLETS BY MOUTH WITH FOOD OR MILK ONCE A DAY 60 tablet 6  . levothyroxine (SYNTHROID) 25 MCG tablet TAKE 1 TABLET BY MOUTH EVERY DAY 20 tablet 13  . losartan (COZAAR) 100 MG tablet TAKE ONE TAB BY MOUTH DAILY 24 tablet 11  . meloxicam (MOBIC) 15 MG tablet TAKE ONE TABLET BY MOUTH EACH AM WITH BREAKFAST AS NEEDED FOR PAIN 30 tablet 2  . metFORMIN (GLUCOPHAGE) 500 MG tablet Take 1 tablet (500 mg total) by mouth daily with breakfast. 1 tablet 0  . metoprolol succinate (TOPROL-XL) 25 MG 24 hr tablet TAKE 1 TABLET BY MOUTH EVERYDAY AT BEDTIME 90 tablet 1  . Multiple Vitamin (MULTIVITAMIN) tablet Take 1 tablet by mouth daily.    Marland Kitchen MYRBETRIQ 25 MG TB24 tablet TAKE 1 TABLET BY MOUTH EVERYDAY AT BEDTIME 30 tablet 5  . nystatin (MYCOSTATIN/NYSTOP) powder Apply 1 gram to affected area 2 times daily. 60 g 11  . potassium chloride (K-DUR) 10 MEQ tablet TAKE 1 TABLET (10 MEQ TOTAL) BY MOUTH 2 (TWO) TIMES DAILY. 60 tablet 11  . rosuvastatin (CRESTOR) 10 MG tablet TAKE 1 TABLET BY MOUTH EVERYDAY AT BEDTIME 30 tablet 11  . senna (SENOKOT) 8.6 MG tablet Take 1 tablet by mouth at bedtime.     . traMADol (ULTRAM) 50 MG tablet TAKE 1-2 TABLETS (50-100 MG TOTAL) BY MOUTH 3 (THREE) TIMES DAILY. 180 tablet 0  . TURMERIC PO Take 1,000 mg by mouth daily.    . vitamin B-12 (CYANOCOBALAMIN) 100 MCG tablet Take 100 mcg by mouth at bedtime.     . Vitamin D, Ergocalciferol, (DRISDOL) 1.25 MG (50000 UT) CAPS capsule TAKE 1 CAPSULE (50,000 UNITS TOTAL) BY MOUTH EVERY SUNDAY. 12 capsule 2  . diclofenac  Sodium (VOLTAREN) 1 % GEL      No facility-administered medications prior to visit.    Allergies  Allergen Reactions  . Iodine Other (See Comments)  Shook violently and passed out.  . Codeine Other (See Comments)    Hallucinations  . Lipitor [Atorvastatin] Other (See Comments)    Myalgias    ROS Review of Systems    Objective:    Physical Exam  Constitutional: She is oriented to person, place, and time. She appears well-developed and well-nourished.  HENT:  Head: Normocephalic and atraumatic.  Cardiovascular: Normal rate, regular rhythm and normal heart sounds.  Pulmonary/Chest: Effort normal and breath sounds normal.  Musculoskeletal:     Comments: 1+ pitting edema bilaterally. Has compression stocking.    Neurological: She is alert and oriented to person, place, and time.  Skin: Skin is warm and dry.  Psychiatric: She has a normal mood and affect. Her behavior is normal.    BP (!) 154/55   Pulse (!) 51   Ht 5\' 2"  (1.575 m)   Wt 200 lb (90.7 kg)   SpO2 100%   BMI 36.58 kg/m  Wt Readings from Last 3 Encounters:  09/08/19 200 lb (90.7 kg)  09/01/19 206 lb (93.4 kg)  08/18/19 203 lb (92.1 kg)     Health Maintenance Due  Topic Date Due  . OPHTHALMOLOGY EXAM  Never done    There are no preventive care reminders to display for this patient.  Lab Results  Component Value Date   TSH 0.48 08/22/2018   Lab Results  Component Value Date   WBC 5.2 08/21/2019   HGB 11.9 08/21/2019   HCT 35.9 08/21/2019   MCV 90.0 08/21/2019   PLT 204 08/21/2019   Lab Results  Component Value Date   NA 139 08/21/2019   K 4.2 08/21/2019   CO2 28 08/21/2019   GLUCOSE 86 08/21/2019   BUN 23 08/21/2019   CREATININE 0.68 08/21/2019   BILITOT 0.7 08/21/2019   ALKPHOS 66 12/23/2017   AST 20 08/21/2019   ALT 14 08/21/2019   PROT 6.3 08/21/2019   ALBUMIN 3.7 12/23/2017   CALCIUM 9.3 08/21/2019   ANIONGAP 10 01/04/2018   Lab Results  Component Value Date   CHOL 141  08/21/2019   Lab Results  Component Value Date   HDL 80 08/21/2019   Lab Results  Component Value Date   LDLCALC 47 08/21/2019   Lab Results  Component Value Date   TRIG 56 08/21/2019   Lab Results  Component Value Date   CHOLHDL 1.8 08/21/2019   Lab Results  Component Value Date   HGBA1C 5.4 06/22/2019      Assessment & Plan:   Problem List Items Addressed This Visit      Cardiovascular and Mediastinum   Essential hypertension, benign - Primary    Blood pressure is still high today.  And even at home she has been getting blood pressures in the 140s we discussed that based on her age still would like to get her blood pressure under 140 if at all possible.  She is already maxed out on her ACE inhibitor.  Metoprolol is at a low dose but her pulse is already in the 50s.  So we will try adding 5 mg of amlodipine need to monitor for increase in swelling.  Follow-up 3 to 4 weeks before she is due for refill.      Relevant Medications   amLODipine (NORVASC) 5 MG tablet   Other Relevant Orders   BASIC METABOLIC PANEL WITH GFR     Other   Bilateral lower extremity edema    Swelling actually does look better today still little  greater on the left leg compared to the right left.  She actually does have compression stockings on so she is actually been getting the mom with the help of her husband and she has been trying to wear them which is fantastic.  She is actually down about 6 pounds on our scale so for now we will continue with Bumex but need to get an updated BMP just to make sure that her renal function looks okay as well as her potassium.       Other Visit Diagnoses    Lower extremity edema       Relevant Orders   BASIC METABOLIC PANEL WITH GFR   Lymphedema         Reviewed Echo with her toda and her husband as well. Given copy.   Meds ordered this encounter  Medications  . amLODipine (NORVASC) 5 MG tablet    Sig: Take 1 tablet (5 mg total) by mouth daily.     Dispense:  30 tablet    Refill:  3    Follow-up: Return in about 3 weeks (around 09/29/2019), or if symptoms worsen or fail to improve.    Beatrice Lecher, MD

## 2019-09-08 NOTE — Progress Notes (Signed)
Doing well on Bumetanide. She has noticed that the swelling in her feet has really gone down considerably.

## 2019-09-11 NOTE — Progress Notes (Signed)
All labs are normal. 

## 2019-09-20 ENCOUNTER — Other Ambulatory Visit: Payer: Self-pay | Admitting: Family Medicine

## 2019-09-24 ENCOUNTER — Other Ambulatory Visit: Payer: Self-pay | Admitting: Family Medicine

## 2019-09-24 DIAGNOSIS — I890 Lymphedema, not elsewhere classified: Secondary | ICD-10-CM

## 2019-09-24 DIAGNOSIS — R600 Localized edema: Secondary | ICD-10-CM

## 2019-09-29 ENCOUNTER — Encounter: Payer: Self-pay | Admitting: Family Medicine

## 2019-09-29 ENCOUNTER — Ambulatory Visit (INDEPENDENT_AMBULATORY_CARE_PROVIDER_SITE_OTHER): Payer: Medicare Other | Admitting: Family Medicine

## 2019-09-29 ENCOUNTER — Other Ambulatory Visit: Payer: Self-pay

## 2019-09-29 VITALS — BP 142/64 | HR 58 | Ht 62.0 in | Wt 200.0 lb

## 2019-09-29 DIAGNOSIS — I872 Venous insufficiency (chronic) (peripheral): Secondary | ICD-10-CM | POA: Diagnosis not present

## 2019-09-29 DIAGNOSIS — R600 Localized edema: Secondary | ICD-10-CM

## 2019-09-29 DIAGNOSIS — I10 Essential (primary) hypertension: Secondary | ICD-10-CM

## 2019-09-29 DIAGNOSIS — I890 Lymphedema, not elsewhere classified: Secondary | ICD-10-CM | POA: Diagnosis not present

## 2019-09-29 NOTE — Patient Instructions (Signed)
Take 1/2 tab of the bumex on the days inbetween the whole tab.    Continue to check weight daily and bring those in with you.  If you are noticing Blood pressures under 110 then marked those on your sheet so that we can try to keep track.  If you are having several of those then we may need to actually decrease your amlodipine.  Go ahead and decrease her fluid intake to 50 ounces daily.

## 2019-09-29 NOTE — Progress Notes (Signed)
Established Patient Office Visit  Subjective:  Patient ID: Joann Maddox, female    DOB: [birth year]  Age: 79 y.o. MRN: [id number]  CC:  Chief Complaint  Patient presents with  . Hypertension  . Edema    HPI Joann Maddox presents for f/U lower extremity edema and BP.  She says the swelling is okay but it still not optimal.  She has not been able to take the Bumex daily she says if she takes it every day she feels like it is just too much it makes her go to the bathroom to the to the point that she just feels exhausted and tired and worn out.  Also here to follow-up for blood pressure.  We added 5 mg of amlodipine at the last office visit because of uncontrolled hypertension.  Her pressure was 154/55 at the last visit.  She says her home blood pressures have looked fantastic since she added on the amlodipine and the swelling has not gotten worse.   Past Medical History:  Diagnosis Date  . Anxiety   . Arthritis   . Depression   . Headache(784.0)    migrains  . Heart murmur   . Hypothyroidism   . Pre-diabetes   . Rheumatoid arthritis(714.0)     Past Surgical History:  Procedure Laterality Date  . ABDOMINAL HYSTERECTOMY    . ANTERIOR CERVICAL DECOMP/DISCECTOMY FUSION N/A 01/03/2018   Procedure: ANTERIOR CERVICAL DECOMPRESSION/DISCECTOMY FUSION, ALLOGRAFT, PLATE;  Surgeon: Eldred Manges, MD;  Location: [facility] OR;  Service: Orthopedics;  Laterality: N/A;  . APPENDECTOMY    . CHOLECYSTECTOMY    . REPLACEMENT TOTAL KNEE BILATERAL    . TONSILLECTOMY     as  a child  . TOTAL HIP ARTHROPLASTY     right and left    Family History  Problem Relation Age of Onset  . Heart disease Father 26  . Diabetes Other        aunt   . Stroke Mother 97    Social History   Socioeconomic History  . Marital status: Married    Spouse name: Dorene Sorrow  . Number of children: 2  . Years of education: Not on file  . Highest education level: Not on file  Occupational History  . Occupation: Retired  Runner, broadcasting/film/video    Comment: Corporate treasurer  Tobacco Use  . Smoking status: Never Smoker  . Smokeless tobacco: Never Used  Substance and Sexual Activity  . Alcohol use: No  . Drug use: Not on file  . Sexual activity: Not on file  Other Topics Concern  . Not on file  Social History Narrative   No regular exercise. 2 caffeine drinks per day.    Social Determinants of Health   Financial Resource Strain:   . Difficulty of Paying Living Expenses:   Food Insecurity:   . Worried About Programme researcher, broadcasting/film/video in the Last Year:   . Barista in the Last Year:   Transportation Needs:   . Freight forwarder (Medical):   Marland Kitchen Lack of Transportation (Non-Medical):   Physical Activity:   . Days of Exercise per Week:   . Minutes of Exercise per Session:   Stress:   . Feeling of Stress :   Social Connections:   . Frequency of Communication with Friends and Family:   . Frequency of Social Gatherings with Friends and Family:   . Attends Religious Services:   . Active Member of Clubs or Organizations:   .  Attends Banker Meetings:   Marland Kitchen Marital Status:   Intimate Partner Violence:   . Fear of Current or Ex-Partner:   . Emotionally Abused:   Marland Kitchen Physically Abused:   . Sexually Abused:     Outpatient Medications Prior to Visit  Medication Sig Dispense Refill  . amLODipine (NORVASC) 5 MG tablet Take 1 tablet (5 mg total) by mouth daily. 30 tablet 3  . azelaic acid (AZELEX) 20 % cream APPLY TOPICALL 2 TIMES A DAY (MORNING AND EVENING). APPLY AFTER SKIN IS WASHED AND PATTED DRY. 30 g 6  . bumetanide (BUMEX) 1 MG tablet TAKE 1 TABLET (1 MG TOTAL) BY MOUTH IN THE MORNING. 90 tablet 1  . butalbital-acetaminophen-caffeine (FIORICET) 50-325-40 MG tablet Take 1 tablet by mouth 2 (two) times daily as needed. 14 tablet 5  . diclofenac sodium (VOLTAREN) 1 % GEL Apply 2 g topically 4 (four) times daily. To affected joint. 100 g 11  . doxepin (SINEQUAN) 10 MG/ML solution TAKE 0.5 MLS (5 MG TOTAL)  BY MOUTH AT BEDTIME. 15 mL 7  . FLUoxetine (PROZAC) 20 MG tablet Take 1 tablet (20 mg total) by mouth daily. 90 tablet 1  . folic acid (FOLVITE) 1 MG tablet TAKE 1 TABLET BY MOUTH EVERY DAY 90 tablet 3  . gabapentin (NEURONTIN) 300 MG capsule Take 1 capsule (300 mg total) by mouth at bedtime. One tab PO qHS for a week, then BID for a week, then TID. May double weekly to a max of 3,600mg /day 90 capsule 3  . hydroxychloroquine (PLAQUENIL) 200 MG tablet TALE 2 TABLETS BY MOUTH WITH FOOD OR MILK ONCE A DAY 60 tablet 6  . levothyroxine (SYNTHROID) 25 MCG tablet TAKE 1 TABLET BY MOUTH EVERY DAY 20 tablet 13  . losartan (COZAAR) 100 MG tablet TAKE ONE TAB BY MOUTH DAILY 24 tablet 11  . meloxicam (MOBIC) 15 MG tablet TAKE ONE TABLET BY MOUTH EACH AM WITH BREAKFAST AS NEEDED FOR PAIN 30 tablet 2  . metFORMIN (GLUCOPHAGE) 500 MG tablet Take 1 tablet (500 mg total) by mouth daily with breakfast. 1 tablet 0  . metoprolol succinate (TOPROL-XL) 25 MG 24 hr tablet TAKE 1 TABLET BY MOUTH EVERYDAY AT BEDTIME 90 tablet 1  . Multiple Vitamin (MULTIVITAMIN) tablet Take 1 tablet by mouth daily.    Marland Kitchen MYRBETRIQ 25 MG TB24 tablet TAKE 1 TABLET BY MOUTH EVERYDAY AT BEDTIME 30 tablet 5  . nystatin (MYCOSTATIN/NYSTOP) powder Apply 1 gram to affected area 2 times daily. 60 g 11  . potassium chloride (K-DUR) 10 MEQ tablet TAKE 1 TABLET (10 MEQ TOTAL) BY MOUTH 2 (TWO) TIMES DAILY. 60 tablet 11  . rosuvastatin (CRESTOR) 10 MG tablet TAKE 1 TABLET BY MOUTH EVERYDAY AT BEDTIME 30 tablet 11  . senna (SENOKOT) 8.6 MG tablet Take 1 tablet by mouth at bedtime.     . traMADol (ULTRAM) 50 MG tablet TAKE 1-2 TABLETS (50-100 MG TOTAL) BY MOUTH 3 (THREE) TIMES DAILY. 180 tablet 0  . TURMERIC PO Take 1,000 mg by mouth daily.    . vitamin B-12 (CYANOCOBALAMIN) 100 MCG tablet Take 100 mcg by mouth at bedtime.     . Vitamin D, Ergocalciferol, (DRISDOL) 1.25 MG (50000 UT) CAPS capsule TAKE 1 CAPSULE (50,000 UNITS TOTAL) BY MOUTH EVERY SUNDAY.  12 capsule 2   No facility-administered medications prior to visit.    Allergies  Allergen Reactions  . Iodine Other (See Comments)    Shook violently and passed out.  . Codeine  Other (See Comments)    Hallucinations  . Lipitor [Atorvastatin] Other (See Comments)    Myalgias    ROS Review of Systems    Objective:    Physical Exam  Constitutional: She is oriented to person, place, and time. She appears well-developed and well-nourished.  HENT:  Head: Normocephalic and atraumatic.  Cardiovascular: Normal rate, regular rhythm and normal heart sounds.  Pulmonary/Chest: Effort normal and breath sounds normal.  Neurological: She is alert and oriented to person, place, and time.  Skin: Skin is warm and dry.  Psychiatric: She has a normal mood and affect. Her behavior is normal.    BP (!) 142/64   Pulse (!) 58   Ht 5\' 2"  (1.575 m)   Wt 200 lb (90.7 kg)   SpO2 100%   BMI 36.58 kg/m  Wt Readings from Last 3 Encounters:  09/29/19 200 lb (90.7 kg)  09/08/19 200 lb (90.7 kg)  09/01/19 206 lb (93.4 kg)     Health Maintenance Due  Topic Date Due  . OPHTHALMOLOGY EXAM  Never done    There are no preventive care reminders to display for this patient.  Lab Results  Component Value Date   TSH 0.48 08/22/2018   Lab Results  Component Value Date   WBC 5.2 08/21/2019   HGB 11.9 08/21/2019   HCT 35.9 08/21/2019   MCV 90.0 08/21/2019   PLT 204 08/21/2019   Lab Results  Component Value Date   NA 142 09/08/2019   K 4.1 09/08/2019   CO2 30 09/08/2019   GLUCOSE 78 09/08/2019   BUN 21 09/08/2019   CREATININE 0.70 09/08/2019   BILITOT 0.7 08/21/2019   ALKPHOS 66 12/23/2017   AST 20 08/21/2019   ALT 14 08/21/2019   PROT 6.3 08/21/2019   ALBUMIN 3.7 12/23/2017   CALCIUM 9.5 09/08/2019   ANIONGAP 10 01/04/2018   Lab Results  Component Value Date   CHOL 141 08/21/2019   Lab Results  Component Value Date   HDL 80 08/21/2019   Lab Results  Component Value Date    LDLCALC 47 08/21/2019   Lab Results  Component Value Date   TRIG 56 08/21/2019   Lab Results  Component Value Date   CHOLHDL 1.8 08/21/2019   Lab Results  Component Value Date   HGBA1C 5.4 06/22/2019      Assessment & Plan:   Problem List Items Addressed This Visit      Cardiovascular and Mediastinum   Essential hypertension, benign - Primary    Blood pressure is a little borderline today.  But she reports that have been fantastic at home.  So will have her bring in her numbers when she returns at next office visit.  She reports a couple of low blood pressures but for now we will just continue current dosing and see her back in about 3 to 4 weeks.  Have her try to take the Bumex with at least a half a tab in between the days where she takes a whole tab instead of just skipping it completely.  Also discussed decreasing fluid intake to 50 ounces daily.      Chronic venous insufficiency    Discussed adjustments to regimen as above.  If she is unable to take the half tape on the in between days then please let 06/24/2019 know.  Again please continue to monitor weights regularly.       Other Visit Diagnoses    Lower extremity edema  Lymphedema           No orders of the defined types were placed in this encounter.   Follow-up: Return in about 3 weeks (around 10/20/2019) for BP and swelling . Please bring in log with you.  Beatrice Lecher, MD

## 2019-09-29 NOTE — Assessment & Plan Note (Signed)
Blood pressure is a little borderline today.  But she reports that have been fantastic at home.  So will have her bring in her numbers when she returns at next office visit.  She reports a couple of low blood pressures but for now we will just continue current dosing and see her back in about 3 to 4 weeks.  Have her try to take the Bumex with at least a half a tab in between the days where she takes a whole tab instead of just skipping it completely.  Also discussed decreasing fluid intake to 50 ounces daily.

## 2019-09-29 NOTE — Assessment & Plan Note (Signed)
Discussed adjustments to regimen as above.  If she is unable to take the half tape on the in between days then please let us know.  Again please continue to monitor weights regularly.

## 2019-10-12 ENCOUNTER — Telehealth: Payer: Self-pay | Admitting: Family Medicine

## 2019-10-12 NOTE — Telephone Encounter (Signed)
Patient would like a refill on nystatin (MYCOSTATIN/NYSTOP) powder [[id number]].

## 2019-10-13 NOTE — Telephone Encounter (Signed)
Pt advised that she can call pharmacy she has refills.

## 2019-10-15 ENCOUNTER — Other Ambulatory Visit: Payer: Self-pay | Admitting: Family Medicine

## 2019-10-19 ENCOUNTER — Other Ambulatory Visit: Payer: Self-pay | Admitting: Family Medicine

## 2019-10-20 ENCOUNTER — Ambulatory Visit (INDEPENDENT_AMBULATORY_CARE_PROVIDER_SITE_OTHER): Payer: Medicare Other | Admitting: Family Medicine

## 2019-10-20 ENCOUNTER — Encounter: Payer: Self-pay | Admitting: Family Medicine

## 2019-10-20 VITALS — BP 128/58 | HR 52 | Ht 62.0 in | Wt 208.0 lb

## 2019-10-20 DIAGNOSIS — R600 Localized edema: Secondary | ICD-10-CM | POA: Diagnosis not present

## 2019-10-20 DIAGNOSIS — I890 Lymphedema, not elsewhere classified: Secondary | ICD-10-CM | POA: Diagnosis not present

## 2019-10-20 DIAGNOSIS — E118 Type 2 diabetes mellitus with unspecified complications: Secondary | ICD-10-CM | POA: Diagnosis not present

## 2019-10-20 DIAGNOSIS — E038 Other specified hypothyroidism: Secondary | ICD-10-CM

## 2019-10-20 DIAGNOSIS — I10 Essential (primary) hypertension: Secondary | ICD-10-CM | POA: Diagnosis not present

## 2019-10-20 LAB — POCT GLYCOSYLATED HEMOGLOBIN (HGB A1C): Hemoglobin A1C: 5.5 % (ref 4.0–5.6)

## 2019-10-20 NOTE — Assessment & Plan Note (Signed)
Taking medication regularly.  Due to recheck TSH its been a little over a year

## 2019-10-20 NOTE — Progress Notes (Signed)
Established Patient Office Visit  Subjective:  Patient ID: Joann Maddox, female    DOB: [birth year]  Age: 79 y.o. MRN: [id number]  CC: No chief complaint on file.   HPI Joann Maddox presents for   Diabetes - no hypoglycemic events. No wounds or sores that are not healing well. No increased thirst or urination. Checking glucose at home. Taking medications as prescribed without any side effects.  Hypertension- Pt denies chest pain, SOB, dizziness, or heart palpitations.  Taking meds as directed w/o problems.  Denies medication side effects.    She still very concerned about the swelling in her legs.  Part of the issue is that she has only been taking half of her diuretic daily because when she takes that she has such urinary urgency that she actually has accidents.\  Been worried about both of her sons and says that really impacting her health.  Her older son has been recently hospitalized after having had his gallbladder removed and had complications he has been ill for almost 2 months.  Her other son who is also my patient has had multiple eye surgeries and unfortunately is getting ready to have another eye surgery.  He has Stickler syndrome.  Past Medical History:  Diagnosis Date  . Anxiety   . Arthritis   . Depression   . Headache(784.0)    migrains  . Heart murmur   . Hypothyroidism   . Pre-diabetes   . Rheumatoid arthritis(714.0)     Past Surgical History:  Procedure Laterality Date  . ABDOMINAL HYSTERECTOMY    . ANTERIOR CERVICAL DECOMP/DISCECTOMY FUSION N/A 01/03/2018   Procedure: ANTERIOR CERVICAL DECOMPRESSION/DISCECTOMY FUSION, ALLOGRAFT, PLATE;  Surgeon: Eldred Manges, MD;  Location: [facility] OR;  Service: Orthopedics;  Laterality: N/A;  . APPENDECTOMY    . CHOLECYSTECTOMY    . REPLACEMENT TOTAL KNEE BILATERAL    . TONSILLECTOMY     as  a child  . TOTAL HIP ARTHROPLASTY     right and left    Family History  Problem Relation Age of Onset  . Heart disease Father 75   . Diabetes Other        aunt   . Stroke Mother 86    Social History   Socioeconomic History  . Marital status: Married    Spouse name: Dorene Sorrow  . Number of children: 2  . Years of education: Not on file  . Highest education level: Not on file  Occupational History  . Occupation: Retired Runner, broadcasting/film/video    Comment: Corporate treasurer  Tobacco Use  . Smoking status: Never Smoker  . Smokeless tobacco: Never Used  Substance and Sexual Activity  . Alcohol use: No  . Drug use: Not on file  . Sexual activity: Not on file  Other Topics Concern  . Not on file  Social History Narrative   No regular exercise. 2 caffeine drinks per day.    Social Determinants of Health   Financial Resource Strain:   . Difficulty of Paying Living Expenses:   Food Insecurity:   . Worried About Programme researcher, broadcasting/film/video in the Last Year:   . Barista in the Last Year:   Transportation Needs:   . Freight forwarder (Medical):   Marland Kitchen Lack of Transportation (Non-Medical):   Physical Activity:   . Days of Exercise per Week:   . Minutes of Exercise per Session:   Stress:   . Feeling of Stress :   Social Connections:   .  Frequency of Communication with Friends and Family:   . Frequency of Social Gatherings with Friends and Family:   . Attends Religious Services:   . Active Member of Clubs or Organizations:   . Attends Archivist Meetings:   Marland Kitchen Marital Status:   Intimate Partner Violence:   . Fear of Current or Ex-Partner:   . Emotionally Abused:   Marland Kitchen Physically Abused:   . Sexually Abused:     Outpatient Medications Prior to Visit  Medication Sig Dispense Refill  . amLODipine (NORVASC) 5 MG tablet Take 1 tablet (5 mg total) by mouth daily. 30 tablet 3  . azelaic acid (AZELEX) 20 % cream APPLY TOPICALL 2 TIMES A DAY (MORNING AND EVENING). APPLY AFTER SKIN IS WASHED AND PATTED DRY. 30 g 6  . bumetanide (BUMEX) 1 MG tablet TAKE 1 TABLET (1 MG TOTAL) BY MOUTH IN THE MORNING. 90 tablet 1  .  butalbital-acetaminophen-caffeine (FIORICET) 50-325-40 MG tablet Take 1 tablet by mouth 2 (two) times daily as needed. 14 tablet 5  . diclofenac sodium (VOLTAREN) 1 % GEL Apply 2 g topically 4 (four) times daily. To affected joint. 100 g 11  . diclofenac Sodium (VOLTAREN) 1 % GEL Apply 2 g topically 4 (four) times daily.    Marland Kitchen doxepin (SINEQUAN) 10 MG/ML solution TAKE 0.5 MLS (5 MG TOTAL) BY MOUTH AT BEDTIME. 45 mL 0  . FLUoxetine (PROZAC) 20 MG tablet Take 1 tablet (20 mg total) by mouth daily. 90 tablet 1  . folic acid (FOLVITE) 1 MG tablet TAKE 1 TABLET BY MOUTH EVERY DAY 90 tablet 3  . gabapentin (NEURONTIN) 300 MG capsule Take 1 capsule (300 mg total) by mouth at bedtime. One tab PO qHS for a week, then BID for a week, then TID. May double weekly to a max of 3,600mg /day 90 capsule 3  . hydroxychloroquine (PLAQUENIL) 200 MG tablet TALE 2 TABLETS BY MOUTH WITH FOOD OR MILK ONCE A DAY 60 tablet 6  . levothyroxine (SYNTHROID) 25 MCG tablet TAKE 1 TABLET BY MOUTH EVERY DAY 20 tablet 13  . losartan (COZAAR) 100 MG tablet TAKE ONE TAB BY MOUTH DAILY 24 tablet 11  . meloxicam (MOBIC) 15 MG tablet TAKE ONE TABLET BY MOUTH EACH AM WITH BREAKFAST AS NEEDED FOR PAIN 30 tablet 2  . metFORMIN (GLUCOPHAGE) 500 MG tablet Take 1 tablet (500 mg total) by mouth daily with breakfast. 1 tablet 0  . metoprolol succinate (TOPROL-XL) 25 MG 24 hr tablet TAKE 1 TABLET BY MOUTH EVERYDAY AT BEDTIME 90 tablet 1  . Multiple Vitamin (MULTIVITAMIN) tablet Take 1 tablet by mouth daily.    Marland Kitchen MYRBETRIQ 25 MG TB24 tablet TAKE 1 TABLET BY MOUTH EVERYDAY AT BEDTIME 30 tablet 5  . nystatin (MYCOSTATIN/NYSTOP) powder Apply 1 gram to affected area 2 times daily. 60 g 11  . potassium chloride (K-DUR) 10 MEQ tablet TAKE 1 TABLET (10 MEQ TOTAL) BY MOUTH 2 (TWO) TIMES DAILY. 60 tablet 11  . rosuvastatin (CRESTOR) 10 MG tablet TAKE 1 TABLET BY MOUTH EVERYDAY AT BEDTIME 30 tablet 11  . senna (SENOKOT) 8.6 MG tablet Take 1 tablet by mouth at  bedtime.     . traMADol (ULTRAM) 50 MG tablet TAKE 1-2 TABLETS (50-100 MG TOTAL) BY MOUTH 3 (THREE) TIMES DAILY. 180 tablet 0  . TURMERIC PO Take 1,000 mg by mouth daily.    . vitamin B-12 (CYANOCOBALAMIN) 100 MCG tablet Take 100 mcg by mouth at bedtime.     . Vitamin  D, Ergocalciferol, (DRISDOL) 1.25 MG (50000 UT) CAPS capsule TAKE 1 CAPSULE (50,000 UNITS TOTAL) BY MOUTH EVERY SUNDAY. 12 capsule 2   No facility-administered medications prior to visit.    Allergies  Allergen Reactions  . Iodine Other (See Comments)    Shook violently and passed out.  . Codeine Other (See Comments)    Hallucinations  . Lipitor [Atorvastatin] Other (See Comments)    Myalgias    ROS Review of Systems    Objective:    Physical Exam Constitutional:      Appearance: She is well-developed.  HENT:     Head: Normocephalic and atraumatic.  Cardiovascular:     Rate and Rhythm: Normal rate and regular rhythm.     Heart sounds: Normal heart sounds.     Comments: 2/6 SEM Pulmonary:     Effort: Pulmonary effort is normal.     Breath sounds: Normal breath sounds.  Skin:    General: Skin is warm and dry.  Neurological:     Mental Status: She is alert and oriented to person, place, and time.  Psychiatric:        Behavior: Behavior normal.     BP (!) 128/58   Pulse (!) 52   Ht 5\' 2"  (1.575 m)   Wt 208 lb (94.3 kg)   SpO2 98%   BMI 38.04 kg/m  Wt Readings from Last 3 Encounters:  10/20/19 208 lb (94.3 kg)  09/29/19 200 lb (90.7 kg)  09/08/19 200 lb (90.7 kg)     Health Maintenance Due  Topic Date Due  . Hepatitis C Screening  Never done  . OPHTHALMOLOGY EXAM  Never done    There are no preventive care reminders to display for this patient.  Lab Results  Component Value Date   TSH 0.48 08/22/2018   Lab Results  Component Value Date   WBC 5.2 08/21/2019   HGB 11.9 08/21/2019   HCT 35.9 08/21/2019   MCV 90.0 08/21/2019   PLT 204 08/21/2019   Lab Results  Component Value Date    NA 142 09/08/2019   K 4.1 09/08/2019   CO2 30 09/08/2019   GLUCOSE 78 09/08/2019   BUN 21 09/08/2019   CREATININE 0.70 09/08/2019   BILITOT 0.7 08/21/2019   ALKPHOS 66 12/23/2017   AST 20 08/21/2019   ALT 14 08/21/2019   PROT 6.3 08/21/2019   ALBUMIN 3.7 12/23/2017   CALCIUM 9.5 09/08/2019   ANIONGAP 10 01/04/2018   Lab Results  Component Value Date   CHOL 141 08/21/2019   Lab Results  Component Value Date   HDL 80 08/21/2019   Lab Results  Component Value Date   LDLCALC 47 08/21/2019   Lab Results  Component Value Date   TRIG 56 08/21/2019   Lab Results  Component Value Date   CHOLHDL 1.8 08/21/2019   Lab Results  Component Value Date   HGBA1C 5.5 10/20/2019      Assessment & Plan:   Problem List Items Addressed This Visit      Cardiovascular and Mediastinum   Essential hypertension, benign    Ports her home blood pressures have been much better since the addition of the amlodipine and we have not noticed a significant change or increase in her swelling it has been persistent and chronic but has not gotten worse.      Relevant Orders   BASIC METABOLIC PANEL WITH GFR     Endocrine   Hypothyroidism    Taking medication regularly.  Due to recheck TSH its been a little over a year      Relevant Orders   TSH   Controlled diabetes mellitus type 2 with complications (HCC) - Primary    A1c looks phenomenal today at 5.5.      Relevant Orders   POCT glycosylated hemoglobin (Hb A1C) (Completed)    Other Visit Diagnoses    Lower extremity edema       Lymphedema       Relevant Orders   Ambulatory referral to Physical Therapy     LE swelling - will have her split the Bumex 1/2 BID and see fi tthat works better.    Lymphedema - discussed will see if there is a clinic locally.   No orders of the defined types were placed in this encounter.   Follow-up: Return in about 3 months (around 01/20/2020) for DM BP.    Nani Gasser, MD

## 2019-10-20 NOTE — Assessment & Plan Note (Signed)
A1c looks phenomenal today at 5.5. ?

## 2019-10-20 NOTE — Patient Instructions (Signed)
Let us try taking a half a tab of the Bumex in the morning and a half a tab at lunch.  That way you can get a whole tab in daily and see if this helps more consistently with the swelling in your legs.  You to limit your fluids.

## 2019-10-20 NOTE — Assessment & Plan Note (Signed)
Ports her home blood pressures have been much better since the addition of the amlodipine and we have not noticed a significant change or increase in her swelling it has been persistent and chronic but has not gotten worse.

## 2019-10-21 ENCOUNTER — Other Ambulatory Visit: Payer: Self-pay | Admitting: Sports Medicine

## 2019-10-21 DIAGNOSIS — M19012 Primary osteoarthritis, left shoulder: Secondary | ICD-10-CM

## 2019-11-03 ENCOUNTER — Ambulatory Visit: Payer: Medicare Other | Admitting: Family Medicine

## 2019-11-14 ENCOUNTER — Other Ambulatory Visit: Payer: Self-pay | Admitting: Family Medicine

## 2019-11-15 ENCOUNTER — Other Ambulatory Visit: Payer: Self-pay | Admitting: Family Medicine

## 2019-11-18 ENCOUNTER — Other Ambulatory Visit: Payer: Self-pay | Admitting: Family Medicine

## 2019-11-20 ENCOUNTER — Other Ambulatory Visit: Payer: Self-pay | Admitting: Family Medicine

## 2019-12-06 ENCOUNTER — Other Ambulatory Visit: Payer: Self-pay | Admitting: Family Medicine

## 2019-12-13 ENCOUNTER — Other Ambulatory Visit: Payer: Self-pay | Admitting: Family Medicine

## 2019-12-13 DIAGNOSIS — D649 Anemia, unspecified: Secondary | ICD-10-CM

## 2019-12-16 ENCOUNTER — Other Ambulatory Visit: Payer: Self-pay | Admitting: Family Medicine

## 2019-12-21 ENCOUNTER — Telehealth: Payer: Self-pay | Admitting: Family Medicine

## 2019-12-21 NOTE — Telephone Encounter (Signed)
Received fax for PA on Doxepin sent through cover my meds and received message that the drug is not covered under prescription plan. Placing in providers box for review. - CF

## 2019-12-30 ENCOUNTER — Other Ambulatory Visit: Payer: Self-pay | Admitting: Family Medicine

## 2019-12-31 ENCOUNTER — Other Ambulatory Visit: Payer: Self-pay | Admitting: Family Medicine

## 2020-01-12 ENCOUNTER — Other Ambulatory Visit: Payer: Self-pay | Admitting: Family Medicine

## 2020-01-16 ENCOUNTER — Encounter: Payer: Self-pay | Admitting: Family Medicine

## 2020-01-16 ENCOUNTER — Ambulatory Visit (INDEPENDENT_AMBULATORY_CARE_PROVIDER_SITE_OTHER): Payer: Medicare Other | Admitting: Family Medicine

## 2020-01-16 VITALS — BP 126/82 | HR 66 | Ht 62.0 in

## 2020-01-16 DIAGNOSIS — S92415A Nondisplaced fracture of proximal phalanx of left great toe, initial encounter for closed fracture: Secondary | ICD-10-CM

## 2020-01-16 DIAGNOSIS — S42202A Unspecified fracture of upper end of left humerus, initial encounter for closed fracture: Secondary | ICD-10-CM

## 2020-01-16 DIAGNOSIS — S0081XA Abrasion of other part of head, initial encounter: Secondary | ICD-10-CM | POA: Diagnosis not present

## 2020-01-16 DIAGNOSIS — S80212A Abrasion, left knee, initial encounter: Secondary | ICD-10-CM | POA: Diagnosis not present

## 2020-01-16 DIAGNOSIS — M19012 Primary osteoarthritis, left shoulder: Secondary | ICD-10-CM

## 2020-01-16 MED ORDER — TRAMADOL HCL 50 MG PO TABS: 50.0000 mg | ORAL_TABLET | Freq: Three times a day (TID) | ORAL | 0 refills | Status: DC

## 2020-01-16 NOTE — Assessment & Plan Note (Signed)
Placed her in a postop shoe today. Can refer her to Ortho for her left shoulder fracture they can also take a look at her toe but if not we can always repeat the x-ray in 2 weeks to make sure it is healing. I hesitate to put her in anything more bulky because of her fall risk but I think she will do okay with a postop shoe. Just want to get pressure off of it and try to keep her from bending and flexing the toe. Recommend icing the area for swelling and inflammation.

## 2020-01-16 NOTE — Progress Notes (Signed)
Acute Office Visit  Subjective:    Patient ID: Joann Maddox, female    DOB: 03-Oct-1940, 79 y.o.   MRN: [id number]  Chief Complaint  Patient presents with  . Follow-up    HPI Patient is in today for follow-up of recent emergency department visit after a fall. She was walking on an uneven sidewalk around 8 PM on September 15 and fell forward she hit the left side of her face, left shoulder, left knee and injured her left great toe. She was given some tramadol for pain and recommended to take an NSAID as well as ice packs and told to follow-up. He was diagnosed with a left humerus fracture. He is wearing a shoulder sling on her left shoulder. But says it just really is not comfortable. She still having some discomfort over that left anterior knee and the left side of her face. They did suture a laceration over her left eyebrow. She was not told that she had a fracture of her left toe but in reviewing the report as below it did mention a intra-articular fracture of the head of the first digit of the proximal phalanx. It is nondisplaced.  She is still not sleeping well at night but she is taking several naps during the daytime. She says in part because she is really only comfortable if she is in her chair, which she sits in during the daytime. At night if she cannot sleep she will usually just read for a while. Can take hours for her to eventually fall asleep.  TECHNIQUE: XR TOE(S) LEFT   FINDINGS:    BONES:  Nondisplaced intra-articular fracture of the head of the first digit proximalphalanx.  Severe multijoint degenerative changes, most severe in the first metatarsal phalangeal joint.   SOFT TISSUES:  Mild first digit soft tissue swelling   FOREIGN BODY:  Negative.     IMPRESSION:  Nondisplaced intra-articular fracture of thehead of the first digit proximal phalanx.    Past Medical History:  Diagnosis Date  . Anxiety   . Arthritis   . Depression   . Headache(784.0)     migrains  . Heart murmur   . Hypothyroidism   . Pre-diabetes   . Rheumatoid arthritis(714.0)     Past Surgical History:  Procedure Laterality Date  . ABDOMINAL HYSTERECTOMY    . ANTERIOR CERVICAL DECOMP/DISCECTOMY FUSION N/A 01/03/2018   Procedure: ANTERIOR CERVICAL DECOMPRESSION/DISCECTOMY FUSION, ALLOGRAFT, PLATE;  Surgeon: Eldred Manges, MD;  Location: [facility] OR;  Service: Orthopedics;  Laterality: N/A;  . APPENDECTOMY    . CHOLECYSTECTOMY    . REPLACEMENT TOTAL KNEE BILATERAL    . TONSILLECTOMY     as  a child  . TOTAL HIP ARTHROPLASTY     right and left    Family History  Problem Relation Age of Onset  . Heart disease Father 75  . Diabetes Other        aunt   . Stroke Mother 83    Social History   Socioeconomic History  . Marital status: Married    Spouse name: Dorene Sorrow  . Number of children: 2  . Years of education: Not on file  . Highest education level: Not on file  Occupational History  . Occupation: Retired Runner, broadcasting/film/video    Comment: Corporate treasurer  Tobacco Use  . Smoking status: Never Smoker  . Smokeless tobacco: Never Used  Substance and Sexual Activity  . Alcohol use: No  . Drug use: Not on file  .  Sexual activity: Not on file  Other Topics Concern  . Not on file  Social History Narrative   No regular exercise. 2 caffeine drinks per day.    Social Determinants of Health   Financial Resource Strain:   . Difficulty of Paying Living Expenses: Not on file  Food Insecurity:   . Worried About Programme researcher, broadcasting/film/video in the Last Year: Not on file  . Ran Out of Food in the Last Year: Not on file  Transportation Needs:   . Lack of Transportation (Medical): Not on file  . Lack of Transportation (Non-Medical): Not on file  Physical Activity:   . Days of Exercise per Week: Not on file  . Minutes of Exercise per Session: Not on file  Stress:   . Feeling of Stress : Not on file  Social Connections:   . Frequency of Communication with Friends and Family: Not on file   . Frequency of Social Gatherings with Friends and Family: Not on file  . Attends Religious Services: Not on file  . Active Member of Clubs or Organizations: Not on file  . Attends Banker Meetings: Not on file  . Marital Status: Not on file  Intimate Partner Violence:   . Fear of Current or Ex-Partner: Not on file  . Emotionally Abused: Not on file  . Physically Abused: Not on file  . Sexually Abused: Not on file    Outpatient Medications Prior to Visit  Medication Sig Dispense Refill  . amLODipine (NORVASC) 5 MG tablet TAKE 1 TABLET BY MOUTH EVERY DAY 30 tablet 3  . azelaic acid (AZELEX) 20 % cream APPLY TOPICALL 2 TIMES A DAY (MORNING AND EVENING). APPLY AFTER SKIN IS WASHED AND PATTED DRY. 30 g 6  . bumetanide (BUMEX) 1 MG tablet TAKE 1 TABLET (1 MG TOTAL) BY MOUTH IN THE MORNING. 90 tablet 1  . butalbital-acetaminophen-caffeine (FIORICET) 50-325-40 MG tablet Take 1 tablet by mouth 2 (two) times daily as needed. 14 tablet 5  . diclofenac sodium (VOLTAREN) 1 % GEL Apply 2 g topically 4 (four) times daily. To affected joint. 100 g 11  . diclofenac Sodium (VOLTAREN) 1 % GEL Apply 2 g topically 4 (four) times daily.    Marland Kitchen doxepin (SINEQUAN) 10 MG/ML solution TAKE 0.5 MLS (5 MG TOTAL) BY MOUTH AT BEDTIME. 45 mL 1  . FLUoxetine (PROZAC) 20 MG tablet Take 1 tablet (20 mg total) by mouth daily. 90 tablet 1  . folic acid (FOLVITE) 1 MG tablet TAKE 1 TABLET BY MOUTH EVERY DAY 90 tablet 3  . gabapentin (NEURONTIN) 300 MG capsule TAKE 1 CAPSULE (300 MG TOTAL) BY MOUTH AT BEDTIME. ONE TAB BY MOUTH DAILY AT BEDTIME FOR A WEEK, THEN TWO TIMES DAILY FOR A WEEK, THEN THREE TIMES DAILY. MAY DOUBLE WEEKLY TO A MAX OF 3,600MG /DAY 90 capsule 3  . hydroxychloroquine (PLAQUENIL) 200 MG tablet TALE 2 TABLETS BY MOUTH WITH FOOD OR MILK ONCE A DAY 60 tablet 6  . levothyroxine (SYNTHROID) 25 MCG tablet TAKE 1 TABLET BY MOUTH EVERY DAY 20 tablet 13  . losartan (COZAAR) 100 MG tablet TAKE 1 TABLET BY  MOUTH EVERY DAY 90 tablet 1  . meloxicam (MOBIC) 15 MG tablet TAKE ONE TABLET BY MOUTH EACH AM WITH BREAKFAST AS NEEDED FOR PAIN 30 tablet 2  . metFORMIN (GLUCOPHAGE) 500 MG tablet TAKE 1 TABLET BY MOUTH 2 TIMES DAILY WITH A MEAL. 180 tablet 1  . metoprolol succinate (TOPROL-XL) 25 MG 24 hr tablet  Take 1 tablet (25 mg total) by mouth daily. Needs labs 90 tablet 0  . Multiple Vitamin (MULTIVITAMIN) tablet Take 1 tablet by mouth daily.    Marland Kitchen MYRBETRIQ 25 MG TB24 tablet TAKE 1 TABLET BY MOUTH EVERYDAY AT BEDTIME 30 tablet 5  . nystatin (MYCOSTATIN/NYSTOP) powder Apply 1 gram to affected area 2 times daily. 60 g 11  . potassium chloride (K-DUR) 10 MEQ tablet TAKE 1 TABLET (10 MEQ TOTAL) BY MOUTH 2 (TWO) TIMES DAILY. 60 tablet 11  . rosuvastatin (CRESTOR) 10 MG tablet TAKE 1 TABLET BY MOUTH EVERYDAY AT BEDTIME 30 tablet 11  . senna (SENOKOT) 8.6 MG tablet Take 1 tablet by mouth at bedtime.     . TURMERIC PO Take 1,000 mg by mouth daily.    . vitamin B-12 (CYANOCOBALAMIN) 100 MCG tablet Take 100 mcg by mouth at bedtime.     . Vitamin D, Ergocalciferol, (DRISDOL) 1.25 MG (50000 UNIT) CAPS capsule TAKE 1 CAPSULE (50,000 UNITS TOTAL) BY MOUTH EVERY SUNDAY. 4 capsule 8  . traMADol (ULTRAM) 50 MG tablet TAKE 1-2 TABLETS (50-100 MG TOTAL) BY MOUTH 3 (THREE) TIMES DAILY. 180 tablet 0   No facility-administered medications prior to visit.    Allergies  Allergen Reactions  . Iodine Other (See Comments)    Shook violently and passed out.  . Codeine Other (See Comments)    Hallucinations  . Lipitor [Atorvastatin] Other (See Comments)    Myalgias    Review of Systems     Objective:    Physical Exam Vitals reviewed.  Constitutional:      Appearance: She is well-developed.  HENT:     Head: Normocephalic and atraumatic.  Eyes:     Conjunctiva/sclera: Conjunctivae normal.  Cardiovascular:     Rate and Rhythm: Normal rate.  Pulmonary:     Effort: Pulmonary effort is normal.  Musculoskeletal:      Comments: Left great toe is bruised and swollen and tender over the proximal phalanx. She is able to flex and extend just slightly.  Skin:    General: Skin is dry.     Coloration: Skin is not pale.     Comments: Very large bruise over her left upper outer arm and bruising and abrasion over the left knee mildly tender on exam. She has significant bruising and swelling over the left great toe. She has bruising and laceration over the left eye. Incision looks dry. No drainage or significant erythema. Has multiple abrasions on her knuckles on her right hand.  Neurological:     Mental Status: She is alert and oriented to person, place, and time.  Psychiatric:        Behavior: Behavior normal.     BP 126/82   Pulse 66   Ht 5\' 2"  (1.575 m)   SpO2 98%   BMI 38.04 kg/m  Wt Readings from Last 3 Encounters:  10/20/19 208 lb (94.3 kg)  09/29/19 200 lb (90.7 kg)  09/08/19 200 lb (90.7 kg)    Health Maintenance Due  Topic Date Due  . Hepatitis C Screening  Never done  . OPHTHALMOLOGY EXAM  Never done    There are no preventive care reminders to display for this patient.   Lab Results  Component Value Date   TSH 0.48 08/22/2018   Lab Results  Component Value Date   WBC 5.2 08/21/2019   HGB 11.9 08/21/2019   HCT 35.9 08/21/2019   MCV 90.0 08/21/2019   PLT 204 08/21/2019   Lab Results  Component Value Date   NA 142 09/08/2019   K 4.1 09/08/2019   CO2 30 09/08/2019   GLUCOSE 78 09/08/2019   BUN 21 09/08/2019   CREATININE 0.70 09/08/2019   BILITOT 0.7 08/21/2019   ALKPHOS 66 12/23/2017   AST 20 08/21/2019   ALT 14 08/21/2019   PROT 6.3 08/21/2019   ALBUMIN 3.7 12/23/2017   CALCIUM 9.5 09/08/2019   ANIONGAP 10 01/04/2018   Lab Results  Component Value Date   CHOL 141 08/21/2019   Lab Results  Component Value Date   HDL 80 08/21/2019   Lab Results  Component Value Date   LDLCALC 47 08/21/2019   Lab Results  Component Value Date   TRIG 56 08/21/2019   Lab  Results  Component Value Date   CHOLHDL 1.8 08/21/2019   Lab Results  Component Value Date   HGBA1C 5.5 10/20/2019       Assessment & Plan:   Problem List Items Addressed This Visit      Musculoskeletal and Integument   Primary osteoarthritis of left shoulder   Relevant Medications   traMADol (ULTRAM) 50 MG tablet   Closed nondisplaced fracture of proximal phalanx of left great toe - Primary    Placed her in a postop shoe today. Can refer her to Ortho for her left shoulder fracture they can also take a look at her toe but if not we can always repeat the x-ray in 2 weeks to make sure it is healing. I hesitate to put her in anything more bulky because of her fall risk but I think she will do okay with a postop shoe. Just want to get pressure off of it and try to keep her from bending and flexing the toe. Recommend icing the area for swelling and inflammation.      Relevant Orders   Ambulatory referral to Orthopedic Surgery   Closed fracture of left proximal humerus    Will refer to Dr. Annell Greening. Continue with shoulder sling and icing and pain medications as needed for now.      Relevant Orders   Ambulatory referral to Orthopedic Surgery    Other Visit Diagnoses    Abrasion of left knee, initial encounter       Abrasion of face, initial encounter          Apply  Vaseline twice daily to abrasions.   Meds ordered this encounter  Medications  . traMADol (ULTRAM) 50 MG tablet    Sig: Take 1-2 tablets (50-100 mg total) by mouth 3 (three) times daily.    Dispense:  180 tablet    Refill:  0    Not to exceed 5 additional fills before 01/25/2020 DX Code Needed  .     Nani Gasser, MD

## 2020-01-16 NOTE — Patient Instructions (Signed)
He is apply Vaseline twice a day to the abrasions on your knuckles and your left knee. Neck sure that you are icing the painful and swollen areas for about 10 minutes 3-4 times a day if at all possible.

## 2020-01-16 NOTE — Assessment & Plan Note (Signed)
Will refer to Dr. Annell Greening. Continue with shoulder sling and icing and pain medications as needed for now.

## 2020-01-21 ENCOUNTER — Other Ambulatory Visit: Payer: Self-pay | Admitting: Family Medicine

## 2020-01-21 DIAGNOSIS — E038 Other specified hypothyroidism: Secondary | ICD-10-CM

## 2020-01-22 ENCOUNTER — Ambulatory Visit: Payer: Medicare Other | Admitting: Family Medicine

## 2020-01-23 ENCOUNTER — Ambulatory Visit: Payer: Self-pay

## 2020-01-23 ENCOUNTER — Ambulatory Visit (INDEPENDENT_AMBULATORY_CARE_PROVIDER_SITE_OTHER): Payer: Medicare Other | Admitting: Orthopaedic Surgery

## 2020-01-23 ENCOUNTER — Encounter: Payer: Self-pay | Admitting: Orthopaedic Surgery

## 2020-01-23 VITALS — Ht 62.0 in | Wt 195.0 lb

## 2020-01-23 DIAGNOSIS — S92414A Nondisplaced fracture of proximal phalanx of right great toe, initial encounter for closed fracture: Secondary | ICD-10-CM | POA: Diagnosis not present

## 2020-01-23 DIAGNOSIS — M19012 Primary osteoarthritis, left shoulder: Secondary | ICD-10-CM | POA: Diagnosis not present

## 2020-01-23 DIAGNOSIS — S42291D Other displaced fracture of upper end of right humerus, subsequent encounter for fracture with routine healing: Secondary | ICD-10-CM

## 2020-01-23 DIAGNOSIS — S42292D Other displaced fracture of upper end of left humerus, subsequent encounter for fracture with routine healing: Secondary | ICD-10-CM

## 2020-01-23 MED ORDER — HYDROCODONE-ACETAMINOPHEN 5-325 MG PO TABS: 1.0000 | ORAL_TABLET | Freq: Four times a day (QID) | ORAL | 0 refills | Status: DC | PRN

## 2020-01-23 NOTE — Progress Notes (Signed)
Office Visit Note   Patient: Joann Maddox           Date of Birth: [birth year]           MRN: [id number] Visit Date: 01/23/2020              Requested by: Agapito Games, MD [address] [place],  [place] [address] PCP: Agapito Games, MD   Assessment & Plan: Visit Diagnoses: Left great toe fracture. 2.  Left shoulder osteoarthritis  Plan: Patient to use her sling intermittently.  Recheck 3 weeks.  She has some ecchymosis down better elbow.  Repeat x-ray left shoulder on return.  I reviewed the old scan with her and discussed that I do not see a new fracture of her shoulder but she certainly does have severe osteoarthritis bone-on-bone changes and has atrophy consistent with rotator cuff tearing in the past.  We reviewed the toe fracture.  Recheck 3 weeks.  Follow-Up Instructions: Return in about 3 weeks (around 02/13/2020).   Orders:  Orders Placed This Encounter  Procedures  . XR Shoulder Left  . XR Toe Great Left   Meds ordered this encounter  Medications  . HYDROcodone-acetaminophen (NORCO/VICODIN) 5-325 MG tablet    Sig: Take 1-2 tablets by mouth every 6 (six) hours as needed for moderate pain. Prn fracture pain , shoulder and great toe    Dispense:  20 tablet    Refill:  0    Prn shoulder and toe fracture pain      Procedures: No procedures performed   Clinical Data: No additional findings.   Subjective: Chief Complaint  Patient presents with  . Left Shoulder - Fracture, Pain  . Left Foot - Fracture, Pain    HPI 79 year old female had a fall on 11/09/2019 with great toe proximal phalanx condyle fracture and left proximal humerus fracture.  Patient has known severe shoulder osteoarthritis and had previous scan that showed supraspinatus and infraspinatus atrophy with severe osteoarthritis.  X-rays obtained today did not show a new fracture.   Review of Systems other systems updated are negative other than mentioned  HPI.  Objective: Vital Signs: Ht 5\' 2"  (1.575 m)   Wt 195 lb (88.5 kg)   BMI 35.67 kg/m   Physical Exam Constitutional:      Appearance: She is well-developed.  HENT:     Head: Normocephalic.     Right Ear: External ear normal.     Left Ear: External ear normal.  Eyes:     Pupils: Pupils are equal, round, and reactive to light.  Neck:     Thyroid: No thyromegaly.     Trachea: No tracheal deviation.  Cardiovascular:     Rate and Rhythm: Normal rate.  Pulmonary:     Effort: Pulmonary effort is normal.  Abdominal:     Palpations: Abdomen is soft.  Skin:    General: Skin is warm and dry.  Neurological:     Mental Status: She is alert and oriented to person, place, and time.  Psychiatric:        Behavior: Behavior normal.     Ortho Exam patient is in a left shoulder sling.  Axillary sensation laterally over the arm is intact.  She can flex and extend her fingers sensation of the hand and fingers are intact.  Left great toe swelling tenderness over the proximal phalanx and minimal metatarsal phalangeal joint motion with prominent medial osteophyte/bunion.  Specialty Comments:  No specialty comments  available.  Imaging: XR Toe Great Left  Result Date: 01/24/2020 Three-view x-rays left foot demonstrate severe erosive first MTP arthritis with fracture of the medial condyle proximal phalanx minimally displaced. Impression: Left great toe fracture proximal phalanx medial condyle as described above.  XR Shoulder Left  Result Date: 01/24/2020 Three-view x-rays left shoulder shows severe osteoarthritis left shoulder with loss of joint space subchondral sclerosis marginal erosion and significant spurring.  Comparison CT scan 04/16/2019.  No acute fracture is noted. Impression: Severe left glenohumeral osteoarthritis.    PMFS History: Patient Active Problem List   Diagnosis Date Noted  . Iron deficiency anemia 06/22/2019  . Lymph edema 12/06/2018  . Sleep disturbance  12/05/2018  . Bilateral lower extremity edema 09/20/2018  . Obesity (BMI 30-39.9) 09/02/2018  . Varicose veins of both lower extremities 08/22/2018  . Depression, major, single episode, mild (HCC) 08/22/2018  . Falls frequently 08/12/2018  . Mass of thigh, right 05/17/2018  . 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  . Status post cervical spinal fusion 01/11/2018  . Neck pain 10/20/2017  . Chronic venous insufficiency 04/29/2017  . 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HCC) 02/12/2015  . Controlled diabetes mellitus type 2 with complications (HCC) 08/10/2014  . Chronic constipation 02/19/2014  . Rosacea 11/06/2013  . GAD (generalized anxiety disorder) 06/08/2013  . Essential hypertension, benign 05/04/2013  . Migraine with aura 05/04/2013  . Murmur, heart 05/04/2013  . Hyperlipidemia 05/04/2013  . Hypothyroidism 05/04/2013  . Fatigue 02/16/2012  . Obesity, morbid (HCC) 02/16/2012  . Urge incontinence 02/16/2012  . Hyperglycemia 01/12/2012   Past Medical History:  Diagnosis Date  . Anxiety   . Arthritis   . Depression   . Headache(784.0)    migrains  . Heart murmur   . Hypothyroidism   . Pre-diabetes   . Rheumatoid arthritis(714.0)     Family History  Problem Relation Age of Onset  . Heart disease Father 24  . Diabetes Other        aunt   . Stroke Mother 70    Past Surgical History:  Procedure Laterality Date  . ABDOMINAL HYSTERECTOMY    . ANTERIOR CERVICAL DECOMP/DISCECTOMY FUSION N/A 01/03/2018   Procedure: ANTERIOR CERVICAL DECOMPRESSION/DISCECTOMY FUSION, ALLOGRAFT, PLATE;  Surgeon: Eldred Manges, MD;  Location: [facility] OR;  Service: Orthopedics;  Laterality: N/A;  . APPENDECTOMY    . CHOLECYSTECTOMY    . REPLACEMENT TOTAL KNEE BILATERAL    . TONSILLECTOMY     as  a child  . TOTAL HIP ARTHROPLASTY     right and left   Social History   Occupational History  . Occupation: Retired Runner, broadcasting/film/video    Comment: Corporate treasurer  Tobacco Use  .  Smoking status: Never Smoker  . Smokeless tobacco: Never Used  Substance and Sexual Activity  . Alcohol use: No  . Drug use: Not on file  . Sexual activity: Not on file

## 2020-01-26 ENCOUNTER — Other Ambulatory Visit: Payer: Self-pay | Admitting: Family Medicine

## 2020-01-28 ENCOUNTER — Other Ambulatory Visit: Payer: Self-pay | Admitting: Family Medicine

## 2020-02-05 ENCOUNTER — Encounter: Payer: Self-pay | Admitting: Family Medicine

## 2020-02-05 ENCOUNTER — Ambulatory Visit (INDEPENDENT_AMBULATORY_CARE_PROVIDER_SITE_OTHER): Payer: Medicare Other | Admitting: Family Medicine

## 2020-02-05 VITALS — BP 136/72 | HR 56 | Ht 62.0 in | Wt 206.0 lb

## 2020-02-05 DIAGNOSIS — S80212D Abrasion, left knee, subsequent encounter: Secondary | ICD-10-CM

## 2020-02-05 DIAGNOSIS — E038 Other specified hypothyroidism: Secondary | ICD-10-CM | POA: Diagnosis not present

## 2020-02-05 DIAGNOSIS — Z23 Encounter for immunization: Secondary | ICD-10-CM

## 2020-02-05 DIAGNOSIS — S4992XA Unspecified injury of left shoulder and upper arm, initial encounter: Secondary | ICD-10-CM

## 2020-02-05 DIAGNOSIS — I10 Essential (primary) hypertension: Secondary | ICD-10-CM

## 2020-02-05 DIAGNOSIS — E118 Type 2 diabetes mellitus with unspecified complications: Secondary | ICD-10-CM

## 2020-02-05 LAB — POCT GLYCOSYLATED HEMOGLOBIN (HGB A1C): Hemoglobin A1C: 5.3 % (ref 4.0–5.6)

## 2020-02-05 MED ORDER — METFORMIN HCL 500 MG PO TABS
500.0000 mg | ORAL_TABLET | Freq: Every day | ORAL | 0 refills | Status: DC
Start: 2020-02-05 — End: 2020-03-27

## 2020-02-05 NOTE — Progress Notes (Signed)
Established Patient Office Visit  Subjective:  Patient ID: Joann Maddox, female    DOB: Aug 31, 1940  Age: 79 y.o. MRN: [id number]  CC:  Chief Complaint  Patient presents with  . Diabetes  . Hypertension    HPI Joann Maddox presents for   Diabetes - no hypoglycemic events. No wounds or sores that are not healing well. No increased thirst or urination. Checking glucose at home. Taking medications as prescribed without any side effects.  Seeing Dr. Ophelia Charter for her left shoulder s/p fall.  H eis recommending shoulder replacement surgery.  The bones are just fractured.  She is still in a lot of pain. Taking tramadol and gabapentin for pain.  She is also taking the meloxicam daily.  Still has extensive bruising over the left arm and says that Dr.  Ophelia Charter wanted to improved before surgery.  She also has a scab on her left knee that is healing.   He also wants me to check the suture that she had placed to the laceration on the left side of her forehead she says it sticking out.  Past Medical History:  Diagnosis Date  . Anxiety   . Arthritis   . Depression   . Headache(784.0)    migrains  . Heart murmur   . Hypothyroidism   . Pre-diabetes   . Rheumatoid arthritis(714.0)     Past Surgical History:  Procedure Laterality Date  . ABDOMINAL HYSTERECTOMY    . ANTERIOR CERVICAL DECOMP/DISCECTOMY FUSION N/A 01/03/2018   Procedure: ANTERIOR CERVICAL DECOMPRESSION/DISCECTOMY FUSION, ALLOGRAFT, PLATE;  Surgeon: Eldred Manges, MD;  Location: [facility] OR;  Service: Orthopedics;  Laterality: N/A;  . APPENDECTOMY    . CHOLECYSTECTOMY    . REPLACEMENT TOTAL KNEE BILATERAL    . TONSILLECTOMY     as  a child  . TOTAL HIP ARTHROPLASTY     right and left    Family History  Problem Relation Age of Onset  . Heart disease Father 33  . Diabetes Other        aunt   . Stroke Mother 78    Social History   Socioeconomic History  . Marital status: Married    Spouse name: Dorene Sorrow  . Number of children:  2  . Years of education: Not on file  . Highest education level: Not on file  Occupational History  . Occupation: Retired Runner, broadcasting/film/video    Comment: Corporate treasurer  Tobacco Use  . Smoking status: Never Smoker  . Smokeless tobacco: Never Used  Substance and Sexual Activity  . Alcohol use: No  . Drug use: Not on file  . Sexual activity: Not on file  Other Topics Concern  . Not on file  Social History Narrative   No regular exercise. 2 caffeine drinks per day.    Social Determinants of Health   Financial Resource Strain:   . Difficulty of Paying Living Expenses: Not on file  Food Insecurity:   . Worried About Programme researcher, broadcasting/film/video in the Last Year: Not on file  . Ran Out of Food in the Last Year: Not on file  Transportation Needs:   . Lack of Transportation (Medical): Not on file  . Lack of Transportation (Non-Medical): Not on file  Physical Activity:   . Days of Exercise per Week: Not on file  . Minutes of Exercise per Session: Not on file  Stress:   . Feeling of Stress : Not on file  Social Connections:   . Frequency of  Communication with Friends and Family: Not on file  . Frequency of Social Gatherings with Friends and Family: Not on file  . Attends Religious Services: Not on file  . Active Member of Clubs or Organizations: Not on file  . Attends Banker Meetings: Not on file  . Marital Status: Not on file  Intimate Partner Violence:   . Fear of Current or Ex-Partner: Not on file  . Emotionally Abused: Not on file  . Physically Abused: Not on file  . Sexually Abused: Not on file    Outpatient Medications Prior to Visit  Medication Sig Dispense Refill  . amLODipine (NORVASC) 5 MG tablet TAKE 1 TABLET BY MOUTH EVERY DAY 90 tablet 2  . azelaic acid (AZELEX) 20 % cream APPLY TOPICALL 2 TIMES A DAY (MORNING AND EVENING). APPLY AFTER SKIN IS WASHED AND PATTED DRY. 30 g 6  . bumetanide (BUMEX) 1 MG tablet TAKE 1 TABLET (1 MG TOTAL) BY MOUTH IN THE MORNING. 90 tablet  1  . diclofenac sodium (VOLTAREN) 1 % GEL Apply 2 g topically 4 (four) times daily. To affected joint. 100 g 11  . doxepin (SINEQUAN) 10 MG/ML solution TAKE 0.5 MLS (5 MG TOTAL) BY MOUTH AT BEDTIME. 45 mL 1  . FLUoxetine (PROZAC) 20 MG tablet Take 1 tablet (20 mg total) by mouth daily. 90 tablet 1  . folic acid (FOLVITE) 1 MG tablet TAKE 1 TABLET BY MOUTH EVERY DAY 90 tablet 3  . gabapentin (NEURONTIN) 300 MG capsule TAKE 1 CAPSULE (300 MG TOTAL) BY MOUTH AT BEDTIME. ONE TAB BY MOUTH DAILY AT BEDTIME FOR A WEEK, THEN TWO TIMES DAILY FOR A WEEK, THEN THREE TIMES DAILY. MAY DOUBLE WEEKLY TO A MAX OF 3,600MG /DAY 90 capsule 3  . HYDROcodone-acetaminophen (NORCO/VICODIN) 5-325 MG tablet Take 1-2 tablets by mouth every 6 (six) hours as needed for moderate pain. Prn fracture pain , shoulder and great toe 20 tablet 0  . hydroxychloroquine (PLAQUENIL) 200 MG tablet TALE 2 TABLETS BY MOUTH WITH FOOD OR MILK ONCE A DAY 60 tablet 6  . levothyroxine (SYNTHROID) 25 MCG tablet TAKE 1 TABLET BY MOUTH EVERY DAY 30 tablet 3  . losartan (COZAAR) 100 MG tablet TAKE 1 TABLET BY MOUTH EVERY DAY 90 tablet 1  . meloxicam (MOBIC) 15 MG tablet TAKE ONE TABLET BY MOUTH EACH AM WITH BREAKFAST AS NEEDED FOR PAIN 30 tablet 2  . metoprolol succinate (TOPROL-XL) 25 MG 24 hr tablet Take 1 tablet (25 mg total) by mouth daily. Needs labs 90 tablet 0  . Multiple Vitamin (MULTIVITAMIN) tablet Take 1 tablet by mouth daily.    Marland Kitchen MYRBETRIQ 25 MG TB24 tablet TAKE 1 TABLET BY MOUTH EVERYDAY AT BEDTIME 30 tablet 5  . nystatin (MYCOSTATIN/NYSTOP) powder Apply 1 gram to affected area 2 times daily. 60 g 11  . potassium chloride (KLOR-CON) 10 MEQ tablet TAKE 1 TABLET (10 MEQ TOTAL) BY MOUTH 2 (TWO) TIMES DAILY. 60 tablet 11  . rosuvastatin (CRESTOR) 10 MG tablet TAKE 1 TABLET BY MOUTH EVERYDAY AT BEDTIME 30 tablet 11  . senna (SENOKOT) 8.6 MG tablet Take 1 tablet by mouth at bedtime.     . traMADol (ULTRAM) 50 MG tablet Take 1-2 tablets  (50-100 mg total) by mouth 3 (three) times daily. 180 tablet 0  . TURMERIC PO Take 1,000 mg by mouth daily.    . vitamin B-12 (CYANOCOBALAMIN) 100 MCG tablet Take 100 mcg by mouth at bedtime.     . Vitamin D,  Ergocalciferol, (DRISDOL) 1.25 MG (50000 UNIT) CAPS capsule TAKE 1 CAPSULE (50,000 UNITS TOTAL) BY MOUTH EVERY SUNDAY. 4 capsule 8  . butalbital-acetaminophen-caffeine (FIORICET) 50-325-40 MG tablet Take 1 tablet by mouth 2 (two) times daily as needed. 14 tablet 5  . diclofenac Sodium (VOLTAREN) 1 % GEL Apply 2 g topically 4 (four) times daily.    . metFORMIN (GLUCOPHAGE) 500 MG tablet TAKE 1 TABLET BY MOUTH 2 TIMES DAILY WITH A MEAL. 180 tablet 1   No facility-administered medications prior to visit.    Allergies  Allergen Reactions  . Iodine Other (See Comments)    Shook violently and passed out.  . Codeine Other (See Comments)    Hallucinations  . Lipitor [Atorvastatin] Other (See Comments)    Myalgias    ROS Review of Systems    Objective:    Physical Exam Constitutional:      Appearance: She is well-developed.  HENT:     Head: Normocephalic and atraumatic.  Cardiovascular:     Rate and Rhythm: Normal rate and regular rhythm.     Heart sounds: Normal heart sounds.  Pulmonary:     Effort: Pulmonary effort is normal.     Breath sounds: Normal breath sounds.  Skin:    General: Skin is warm and dry.  Neurological:     Mental Status: She is alert and oriented to person, place, and time.  Psychiatric:        Behavior: Behavior normal.     BP 136/72   Pulse (!) 56   Ht 5\' 2"  (1.575 m)   Wt 206 lb (93.4 kg)   SpO2 99%   BMI 37.68 kg/m  Wt Readings from Last 3 Encounters:  02/05/20 206 lb (93.4 kg)  01/23/20 195 lb (88.5 kg)  10/20/19 208 lb (94.3 kg)     Health Maintenance Due  Topic Date Due  . Hepatitis C Screening  Never done  . OPHTHALMOLOGY EXAM  Never done    There are no preventive care reminders to display for this patient.  Lab Results   Component Value Date   TSH 0.48 08/22/2018   Lab Results  Component Value Date   WBC 5.2 08/21/2019   HGB 11.9 08/21/2019   HCT 35.9 08/21/2019   MCV 90.0 08/21/2019   PLT 204 08/21/2019   Lab Results  Component Value Date   NA 142 09/08/2019   K 4.1 09/08/2019   CO2 30 09/08/2019   GLUCOSE 78 09/08/2019   BUN 21 09/08/2019   CREATININE 0.70 09/08/2019   BILITOT 0.7 08/21/2019   ALKPHOS 66 12/23/2017   AST 20 08/21/2019   ALT 14 08/21/2019   PROT 6.3 08/21/2019   ALBUMIN 3.7 12/23/2017   CALCIUM 9.5 09/08/2019   ANIONGAP 10 01/04/2018   Lab Results  Component Value Date   CHOL 141 08/21/2019   Lab Results  Component Value Date   HDL 80 08/21/2019   Lab Results  Component Value Date   LDLCALC 47 08/21/2019   Lab Results  Component Value Date   TRIG 56 08/21/2019   Lab Results  Component Value Date   CHOLHDL 1.8 08/21/2019   Lab Results  Component Value Date   HGBA1C 5.3 02/05/2020      Assessment & Plan:   Problem List Items Addressed This Visit      Cardiovascular and Mediastinum   Essential hypertension, benign    Well controlled. Continue current regimen. Follow up in  6 months.  Relevant Orders   BASIC METABOLIC PANEL WITH GFR     Endocrine   Hypothyroidism    Due to recheck TSH.       Relevant Orders   TSH   Controlled diabetes mellitus type 2 with complications (HCC)    A1c looks phenomenal.  Decrease Metformin down to 1 tab daily and we will plan to recheck again in 3 to 4 months.  If it still looks good we may be able to discontinue Metformin completely and she may just be able to be diet controlled.      Relevant Medications   metFORMIN (GLUCOPHAGE) 500 MG tablet   Other Relevant Orders   POCT glycosylated hemoglobin (Hb A1C) (Completed)    Other Visit Diagnoses    Need for immunization against influenza    -  Primary   Relevant Orders   Flu Vaccine QUAD High Dose(Fluad) (Completed)   Abrasion of left knee, subsequent  encounter       Injury of left shoulder, initial encounter          Apply vaseline to wounds ad scab to promote healing and reduce infection.    Left shoulder injury -  Will refill Tramadol in a couple of week.  jsut having general joint pain that is not well controlled on her current regimen.    Meds ordered this encounter  Medications  . metFORMIN (GLUCOPHAGE) 500 MG tablet    Sig: Take 1 tablet (500 mg total) by mouth daily with breakfast.    Dispense:  90 tablet    Refill:  0    Please discontinue all other Metformin prescriptions.    Follow-up: Return in about 3 months (around 05/07/2020) for Diabetes follow-up.    Nani Gasser, MD

## 2020-02-05 NOTE — Patient Instructions (Signed)
Please decrease your Metformin down to 1 tablet daily in the morning you do not need to take the evening tablet.  Your A1c looks phenomenal.

## 2020-02-05 NOTE — Assessment & Plan Note (Signed)
A1c looks phenomenal.  Decrease Metformin down to 1 tab daily and we will plan to recheck again in 3 to 4 months.  If it still looks good we may be able to discontinue Metformin completely and she may just be able to be diet controlled.

## 2020-02-05 NOTE — Assessment & Plan Note (Signed)
Well controlled. Continue current regimen. Follow up in  6 months.  

## 2020-02-05 NOTE — Assessment & Plan Note (Signed)
Due to recheck TSH. 

## 2020-02-13 ENCOUNTER — Telehealth: Payer: Self-pay

## 2020-02-13 ENCOUNTER — Ambulatory Visit: Payer: Medicare Other | Admitting: Orthopaedic Surgery

## 2020-02-13 DIAGNOSIS — M6281 Muscle weakness (generalized): Secondary | ICD-10-CM

## 2020-02-13 DIAGNOSIS — R29898 Other symptoms and signs involving the musculoskeletal system: Secondary | ICD-10-CM

## 2020-02-13 DIAGNOSIS — R296 Repeated falls: Secondary | ICD-10-CM

## 2020-02-13 NOTE — Telephone Encounter (Signed)
Walker's husband called wanting home health evaluation. Please advise.

## 2020-02-13 NOTE — Telephone Encounter (Signed)
Order placed

## 2020-02-13 NOTE — Telephone Encounter (Signed)
Ok to order Home health.  Can use frequent falls, lower extremity weakness, deconditioning

## 2020-02-15 ENCOUNTER — Other Ambulatory Visit: Payer: Self-pay | Admitting: Family Medicine

## 2020-02-20 ENCOUNTER — Ambulatory Visit: Payer: Medicare Other | Admitting: Orthopaedic Surgery

## 2020-02-23 ENCOUNTER — Other Ambulatory Visit: Payer: Self-pay | Admitting: Family Medicine

## 2020-03-08 ENCOUNTER — Other Ambulatory Visit: Payer: Self-pay | Admitting: Family Medicine

## 2020-03-12 ENCOUNTER — Ambulatory Visit (INDEPENDENT_AMBULATORY_CARE_PROVIDER_SITE_OTHER): Payer: Medicare Other | Admitting: Family Medicine

## 2020-03-12 ENCOUNTER — Encounter: Payer: Self-pay | Admitting: Family Medicine

## 2020-03-12 ENCOUNTER — Other Ambulatory Visit: Payer: Self-pay

## 2020-03-12 VITALS — BP 106/62 | HR 62 | Ht 62.0 in | Wt 188.0 lb

## 2020-03-12 DIAGNOSIS — E118 Type 2 diabetes mellitus with unspecified complications: Secondary | ICD-10-CM | POA: Diagnosis not present

## 2020-03-12 DIAGNOSIS — N390 Urinary tract infection, site not specified: Secondary | ICD-10-CM

## 2020-03-12 DIAGNOSIS — I9589 Other hypotension: Secondary | ICD-10-CM | POA: Diagnosis not present

## 2020-03-12 DIAGNOSIS — E861 Hypovolemia: Secondary | ICD-10-CM

## 2020-03-12 DIAGNOSIS — R519 Headache, unspecified: Secondary | ICD-10-CM | POA: Diagnosis not present

## 2020-03-12 DIAGNOSIS — B9620 Unspecified Escherichia coli [E. coli] as the cause of diseases classified elsewhere: Secondary | ICD-10-CM

## 2020-03-12 DIAGNOSIS — G8929 Other chronic pain: Secondary | ICD-10-CM

## 2020-03-12 MED ORDER — BLOOD GLUCOSE MONITOR KIT: PACK | 99 refills | Status: DC

## 2020-03-12 NOTE — Assessment & Plan Note (Signed)
He was given the rest of her insulin from the hospital to use she has not started it.  Discussed not starting it and not even taking her Metformin for right now monitor blood glucose levels and as long as they are staying under 140 fasting continue to hold Metformin she is really had poor to fair p.o. intake since she has been home from the hospital so I do not want to compound anything by causing any hypoglycemia so we will just hold for now and continue to monitor carefully.  She would like a new prescription for glucometer sent to the pharmacy.

## 2020-03-12 NOTE — Patient Instructions (Signed)
Hold your Metformin.  Continue to hold as long as your blood sugars in the morning are under 140. Let years losartan in half and just take half a tab daily.  I like for your blood pressure to be greater than 115.  So if you are taking half a tab for couple days and it still staying under 115 then you can hold the tab completely for a few days.

## 2020-03-12 NOTE — Progress Notes (Signed)
Established Patient Office Visit  Subjective:  Patient ID: Joann Maddox, female    DOB: Aug 04, 1940  Age: 79 y.o. MRN: [id number]  CC:  Chief Complaint  Patient presents with  . Hospitalization Follow-up    HPI Joann Maddox presents for hospital follow-up.  She presented to the emergency department on October 20 for nausea and vomiting.  Was diagnosed with e coli bacteremia with early pyelonephritis with a 40m obstructing left UPJ stone and left 637mrenal stone.  Urology placed left stent 10/21 with noted purulent urine medial to stone. Abx deescalated to exclusively ceftriaxone and dose-adjusted to accommodate Ecoli bacteremia.  He has followed up with urology since discharge from the hospital they felt like she still had an ongoing infection and so they have placed her on oral Macrobid they are seeing her back in about a week to recheck her urine.  She reports that she has passed 2 stones but still has 1 more.  She is really not having a lot of back pain or discomfort because of this.   Right sided HAs that have been frequent.  They started while she was hospitalized but have continued since she has been home.  They usually start midmorning and then last the rest of the day couple times she is taken Tylenol in a couple times she is used some tramadol it does seem to help some.   She is not taking the doxepin.    Past Medical History:  Diagnosis Date  . Anxiety   . Arthritis   . Depression   . Headache(784.0)    migrains  . Heart murmur   . Hypothyroidism   . Pre-diabetes   . Rheumatoid arthritis(714.0)     Past Surgical History:  Procedure Laterality Date  . ABDOMINAL HYSTERECTOMY    . ANTERIOR CERVICAL DECOMP/DISCECTOMY FUSION N/A 01/03/2018   Procedure: ANTERIOR CERVICAL DECOMPRESSION/DISCECTOMY FUSION, ALLOGRAFT, PLATE;  Surgeon: YaMarybelle KillingsMD;  [facility] Service: Orthopedics;  Laterality: N/A;  . APPENDECTOMY    . CHOLECYSTECTOMY    . REPLACEMENT TOTAL KNEE  BILATERAL    . TONSILLECTOMY     as  a child  . TOTAL HIP ARTHROPLASTY     right and left    Family History  Problem Relation Age of Onset  . Heart disease Father 7724. Diabetes Other        aunt   . Stroke Mother [id number]  Social History   Socioeconomic History  . Marital status: Married    Spouse name: JeSonia Side. Number of children: 2  . Years of education: Not on file  . Highest education level: Not on file  Occupational History  . Occupation: Retired TePharmacist, hospital  Comment: BaEngineering geologistTobacco Use  . Smoking status: Never Smoker  . Smokeless tobacco: Never Used  Substance and Sexual Activity  . Alcohol use: No  . Drug use: Not on file  . Sexual activity: Not on file  Other Topics Concern  . Not on file  Social History Narrative   No regular exercise. 2 caffeine drinks per day.    Social Determinants of Health   Financial Resource Strain:   . Difficulty of Paying Living Expenses: Not on file  Food Insecurity:   . Worried About RuCharity fundraisern the Last Year: Not on file  . Ran Out of Food in the Last Year: Not on file  Transportation Needs:   .  Lack of Transportation (Medical): Not on file  . Lack of Transportation (Non-Medical): Not on file  Physical Activity:   . Days of Exercise per Week: Not on file  . Minutes of Exercise per Session: Not on file  Stress:   . Feeling of Stress : Not on file  Social Connections:   . Frequency of Communication with Friends and Family: Not on file  . Frequency of Social Gatherings with Friends and Family: Not on file  . Attends Religious Services: Not on file  . Active Member of Clubs or Organizations: Not on file  . Attends Archivist Meetings: Not on file  . Marital Status: Not on file  Intimate Partner Violence:   . Fear of Current or Ex-Partner: Not on file  . Emotionally Abused: Not on file  . Physically Abused: Not on file  . Sexually Abused: Not on file    Outpatient Medications Prior to Visit   Medication Sig Dispense Refill  . amLODipine (NORVASC) 5 MG tablet TAKE 1 TABLET BY MOUTH EVERY DAY 90 tablet 2  . azelaic acid (AZELEX) 20 % cream APPLY TOPICALL 2 TIMES A DAY (MORNING AND EVENING). APPLY AFTER SKIN IS WASHED AND PATTED DRY. 30 g 6  . bumetanide (BUMEX) 1 MG tablet TAKE 1 TABLET (1 MG TOTAL) BY MOUTH IN THE MORNING. 90 tablet 1  . diclofenac sodium (VOLTAREN) 1 % GEL Apply 2 g topically 4 (four) times daily. To affected joint. 100 g 11  . FLUoxetine (PROZAC) 20 MG tablet TAKE 1 TABLET BY MOUTH EVERY DAY 30 tablet 5  . folic acid (FOLVITE) 1 MG tablet TAKE 1 TABLET BY MOUTH EVERY DAY 90 tablet 3  . gabapentin (NEURONTIN) 300 MG capsule TAKE 1 CAPSULE (300 MG TOTAL) BY MOUTH AT BEDTIME. ONE TAB BY MOUTH DAILY AT BEDTIME FOR A WEEK, THEN TWO TIMES DAILY FOR A WEEK, THEN THREE TIMES DAILY. MAY DOUBLE WEEKLY TO A MAX OF 3,600MG/DAY 90 capsule 3  . hydroxychloroquine (PLAQUENIL) 200 MG tablet TALE 2 TABLETS BY MOUTH WITH FOOD OR MILK ONCE A DAY 60 tablet 6  . levothyroxine (SYNTHROID) 25 MCG tablet TAKE 1 TABLET BY MOUTH EVERY DAY 30 tablet 3  . losartan (COZAAR) 100 MG tablet TAKE 1 TABLET BY MOUTH EVERY DAY 90 tablet 1  . meloxicam (MOBIC) 15 MG tablet TAKE ONE TABLET BY MOUTH EACH AM WITH BREAKFAST AS NEEDED FOR PAIN 30 tablet 2  . metFORMIN (GLUCOPHAGE) 500 MG tablet Take 1 tablet (500 mg total) by mouth daily with breakfast. 90 tablet 0  . metoprolol succinate (TOPROL-XL) 25 MG 24 hr tablet Take 1 tablet (25 mg total) by mouth daily. Needs labs 90 tablet 0  . Multiple Vitamin (MULTIVITAMIN) tablet Take 1 tablet by mouth daily.    Marland Kitchen MYRBETRIQ 25 MG TB24 tablet TAKE 1 TABLET BY MOUTH EVERYDAY AT BEDTIME 30 tablet 5  . nitrofurantoin, macrocrystal-monohydrate, (MACROBID) 100 MG capsule Take by mouth.    . nystatin (MYCOSTATIN/NYSTOP) powder Apply 1 gram to affected area 2 times daily. 60 g 11  . potassium chloride (KLOR-CON) 10 MEQ tablet TAKE 1 TABLET (10 MEQ TOTAL) BY MOUTH 2  (TWO) TIMES DAILY. 60 tablet 11  . rosuvastatin (CRESTOR) 10 MG tablet TAKE 1 TABLET BY MOUTH EVERYDAY AT BEDTIME 30 tablet 11  . senna (SENOKOT) 8.6 MG tablet Take 1 tablet by mouth at bedtime.     . traMADol (ULTRAM) 50 MG tablet Take 1-2 tablets (50-100 mg total) by mouth 3 (  three) times daily. 180 tablet 0  . TURMERIC PO Take 1,000 mg by mouth daily.    . vitamin B-12 (CYANOCOBALAMIN) 100 MCG tablet Take 100 mcg by mouth at bedtime.     . Vitamin D, Ergocalciferol, (DRISDOL) 1.25 MG (50000 UNIT) CAPS capsule TAKE 1 CAPSULE (50,000 UNITS TOTAL) BY MOUTH EVERY SUNDAY. 4 capsule 8  . doxepin (SINEQUAN) 10 MG/ML solution TAKE 0.5 MLS (5 MG TOTAL) BY MOUTH AT BEDTIME. (Patient not taking: Reported on 03/12/2020) 45 mL 1  . HYDROcodone-acetaminophen (NORCO/VICODIN) 5-325 MG tablet Take 1-2 tablets by mouth every 6 (six) hours as needed for moderate pain. Prn fracture pain , shoulder and great toe 20 tablet 0   No facility-administered medications prior to visit.    Allergies  Allergen Reactions  . Iodine Other (See Comments)    Shook violently and passed out.  . Codeine Other (See Comments)    Hallucinations  . Lipitor [Atorvastatin] Other (See Comments)    Myalgias    ROS Review of Systems    Objective:    Physical Exam Constitutional:      Appearance: She is well-developed.  HENT:     Head: Normocephalic and atraumatic.  Cardiovascular:     Rate and Rhythm: Normal rate and regular rhythm.     Heart sounds: Normal heart sounds.  Pulmonary:     Effort: Pulmonary effort is normal.     Breath sounds: Normal breath sounds.  Musculoskeletal:     Comments: Trace ankle edema bilaterally.  Skin:    General: Skin is warm and dry.  Neurological:     Mental Status: She is alert and oriented to person, place, and time.  Psychiatric:        Behavior: Behavior normal.     BP 106/62   Pulse 62   Ht _0  (1.575 m)   Wt 188 lb (85.3 kg)   SpO2 97%   BMI 34.39 kg/m  Wt  Readings from Last 3 Encounters:  03/12/20 188 lb (85.3 kg)  02/05/20 206 lb (93.4 kg)  01/23/20 195 lb (88.5 kg)     Health Maintenance Due  Topic Date Due  . Hepatitis C Screening  Never done  . OPHTHALMOLOGY EXAM  Never done    There are no preventive care reminders to display for this patient.  Lab Results  Component Value Date   TSH 0.48 08/22/2018   Lab Results  Component Value Date   WBC 5.2 08/21/2019   HGB 11.9 08/21/2019   HCT 35.9 08/21/2019   MCV 90.0 08/21/2019   PLT 204 08/21/2019   Lab Results  Component Value Date   NA 142 09/08/2019   K 4.1 09/08/2019   CO2 30 09/08/2019   GLUCOSE 78 09/08/2019   BUN 21 09/08/2019   CREATININE 0.70 09/08/2019   BILITOT 0.7 08/21/2019   ALKPHOS 66 12/23/2017   AST 20 08/21/2019   ALT 14 08/21/2019   PROT 6.3 08/21/2019   ALBUMIN 3.7 12/23/2017   CALCIUM 9.5 09/08/2019   ANIONGAP 10 01/04/2018   Lab Results  Component Value Date   CHOL 141 08/21/2019   Lab Results  Component Value Date   HDL 80 08/21/2019   Lab Results  Component Value Date   LDLCALC 47 08/21/2019   Lab Results  Component Value Date   TRIG 56 08/21/2019   Lab Results  Component Value Date   CHOLHDL 1.8 08/21/2019   Lab Results  Component Value Date   HGBA1C 5.3 02/05/2020  Assessment & Plan:   Problem List Items Addressed This Visit      Endocrine   Controlled diabetes mellitus type 2 with complications ([place]) - Primary    He was given the rest of her insulin from the hospital to use she has not started it.  Discussed not starting it and not even taking her Metformin for right now monitor blood glucose levels and as long as they are staying under 140 fasting continue to hold Metformin she is really had poor to fair p.o. intake since she has been home from the hospital so I do not want to compound anything by causing any hypoglycemia so we will just hold for now and continue to monitor carefully.  She would like a new  prescription for glucometer sent to the pharmacy.      Relevant Medications   blood glucose meter kit and supplies KIT    Other Visit Diagnoses    Hypotension due to hypovolemia       E. coli UTI       Relevant Medications   nitrofurantoin, macrocrystal-monohydrate, (MACROBID) 100 MG capsule   Chronic nonintractable headache, unspecified headache type          Hypotension-in fact her weight is down about 16 pounds from when she was last here.  Her lower extremity swelling is much improved.  We will have her cut her low [place] in half and just take half a tab daily and monitor blood pressures at home she does have a home cuff.  E. coli UTI-currently on Macrobid make sure to complete course and keep follow-up with urology for repeat urinalysis.  Headaches-could be secondary to recent antibiotic use versus her hypotension which could be causing the headache.  I like to see if we can get her blood pressure come up a little bit get her hydrating a little better and see if her blood her headaches go away.  Okay to use Tylenol as needed.  Meds ordered this encounter  Medications  . blood glucose meter kit and supplies KIT    Sig: Dispense based on patient and insurance preference. Use up to once time daily as directed. (FOR ICD-9 250.00, 250.01).    Dispense:  1 each    Refill:  PRN    Order Specific Question:   Number of strips    Answer:   100    Order Specific Question:   Number of lancets    Answer:   100    Follow-up: Return in about 2 weeks (around 03/26/2020) for recheck BP with PCP.   I spent 42 minutes on the day of the encounter to include pre-visit record review, face-to-face time with the patient and post visit ordering of test.   Beatrice Lecher, MD

## 2020-03-13 ENCOUNTER — Telehealth: Payer: Self-pay

## 2020-03-13 NOTE — Telephone Encounter (Signed)
Tresa Endo with [facility] called for verbal orders. Order given.

## 2020-03-13 NOTE — Telephone Encounter (Signed)
Agree with documentation as above.   Cacie Gaskins, MD  

## 2020-03-17 ENCOUNTER — Encounter: Payer: Self-pay | Admitting: Family Medicine

## 2020-03-18 ENCOUNTER — Encounter: Payer: Self-pay | Admitting: Family Medicine

## 2020-03-18 ENCOUNTER — Telehealth: Payer: Self-pay

## 2020-03-18 DIAGNOSIS — R296 Repeated falls: Secondary | ICD-10-CM

## 2020-03-18 DIAGNOSIS — M6281 Muscle weakness (generalized): Secondary | ICD-10-CM

## 2020-03-18 DIAGNOSIS — R29898 Other symptoms and signs involving the musculoskeletal system: Secondary | ICD-10-CM

## 2020-03-18 MED ORDER — AMBULATORY NON FORMULARY MEDICATION: 0 refills | Status: DC

## 2020-03-18 NOTE — Telephone Encounter (Signed)
Faxed

## 2020-03-18 NOTE — Telephone Encounter (Signed)
OK printed. Placed in [facility]

## 2020-03-18 NOTE — Telephone Encounter (Signed)
Joann Maddox called requesting verbal orders for continued OT once a week for 6 weeks. Verbal orders given.   Also requesting RX for bedside commode and rolator walker to be sent to [facility]   Both RX pended, please print if OK (my computer is not letting print RX right now)

## 2020-03-26 ENCOUNTER — Encounter: Payer: Self-pay | Admitting: Family Medicine

## 2020-03-27 ENCOUNTER — Ambulatory Visit (INDEPENDENT_AMBULATORY_CARE_PROVIDER_SITE_OTHER): Payer: Medicare Other | Admitting: Family Medicine

## 2020-03-27 ENCOUNTER — Other Ambulatory Visit: Payer: Self-pay

## 2020-03-27 VITALS — BP 78/62 | HR 58 | Ht 62.0 in | Wt 183.0 lb

## 2020-03-27 DIAGNOSIS — K2100 Gastro-esophageal reflux disease with esophagitis, without bleeding: Secondary | ICD-10-CM

## 2020-03-27 DIAGNOSIS — I10 Essential (primary) hypertension: Secondary | ICD-10-CM | POA: Diagnosis not present

## 2020-03-27 DIAGNOSIS — G479 Sleep disorder, unspecified: Secondary | ICD-10-CM

## 2020-03-27 DIAGNOSIS — E118 Type 2 diabetes mellitus with unspecified complications: Secondary | ICD-10-CM

## 2020-03-27 DIAGNOSIS — R600 Localized edema: Secondary | ICD-10-CM

## 2020-03-27 LAB — TSH: TSH: 0.34 mIU/L — ABNORMAL LOW (ref 0.40–4.50)

## 2020-03-27 MED ORDER — LOSARTAN POTASSIUM 50 MG PO TABS: 50.0000 mg | ORAL_TABLET | Freq: Every day | ORAL | 0 refills | Status: DC

## 2020-03-27 MED ORDER — BLOOD GLUCOSE MONITOR KIT: PACK | 99 refills | Status: DC

## 2020-03-27 MED ORDER — OMEPRAZOLE 40 MG PO CPDR: 40.0000 mg | DELAYED_RELEASE_CAPSULE | Freq: Every morning | ORAL | 0 refills | Status: DC

## 2020-03-27 NOTE — Progress Notes (Signed)
Established Patient Office Visit  Subjective:  Patient ID: Joann Maddox, female    DOB: [birth year]  Age: 79 y.o. MRN: [id number]  CC:  Chief Complaint  Patient presents with  . Hypertension    HPI [name] presents for   Hypertension-her husband has been sending me some of his blood pressures over my chart.  We will actually cut the losartan in half.  She still been getting some lower blood pressures such as 94/77 with a pulse of 71.  Past Medical History:  Diagnosis Date  . Anxiety   . Arthritis   . Depression   . Headache(784.0)    migrains  . Heart murmur   . Hypothyroidism   . Pre-diabetes   . Rheumatoid arthritis(714.0)     Past Surgical History:  Procedure Laterality Date  . ABDOMINAL HYSTERECTOMY    . ANTERIOR CERVICAL DECOMP/DISCECTOMY FUSION N/A 01/03/2018   Procedure: ANTERIOR CERVICAL DECOMPRESSION/DISCECTOMY FUSION, ALLOGRAFT, PLATE;  Surgeon: Marybelle Killings, MD;  Location: [place];  Service: Orthopedics;  Laterality: N/A;  . APPENDECTOMY    . CHOLECYSTECTOMY    . REPLACEMENT TOTAL KNEE BILATERAL    . TONSILLECTOMY     as  a child  . TOTAL HIP ARTHROPLASTY     right and left    Family History  Problem Relation Age of Onset  . Heart disease Father 73  . Diabetes Other        aunt   . Stroke Mother 59    Social History   Socioeconomic History  . Marital status: Married    Spouse name: Sonia Side  . Number of children: 2  . Years of education: Not on file  . Highest education level: Not on file  Occupational History  . Occupation: Retired Pharmacist, hospital    Comment: Engineering geologist  Tobacco Use  . Smoking status: Never Smoker  . Smokeless tobacco: Never Used  Substance and Sexual Activity  . Alcohol use: No  . Drug use: Not on file  . Sexual activity: Not on file  Other Topics Concern  . Not on file  Social History Narrative   No regular exercise. 2 caffeine drinks per day.    Social Determinants of Health   Financial Resource Strain: Not  on file  Food Insecurity: Not on file  Transportation Needs: Not on file  Physical Activity: Not on file  Stress: Not on file  Social Connections: Not on file  Intimate Partner Violence: Not on file    Outpatient Medications Prior to Visit  Medication Sig Dispense Refill  . AMBULATORY NON FORMULARY MEDICATION Rolator Walker use as needed for ambulation DX: R29.6 1 each 0  . azelaic acid (AZELEX) 20 % cream APPLY TOPICALL 2 TIMES A DAY (MORNING AND EVENING). APPLY AFTER SKIN IS WASHED AND PATTED DRY. 30 g 6  . bumetanide (BUMEX) 1 MG tablet TAKE 1 TABLET (1 MG TOTAL) BY MOUTH IN THE MORNING. 90 tablet 1  . diclofenac sodium (VOLTAREN) 1 % GEL Apply 2 g topically 4 (four) times daily. To affected joint. 100 g 11  . FLUoxetine (PROZAC) 20 MG tablet TAKE 1 TABLET BY MOUTH EVERY DAY 30 tablet 5  . folic acid (FOLVITE) 1 MG tablet TAKE 1 TABLET BY MOUTH EVERY DAY 90 tablet 3  . gabapentin (NEURONTIN) 300 MG capsule TAKE 1 CAPSULE (300 MG TOTAL) BY MOUTH AT BEDTIME. ONE TAB BY MOUTH DAILY AT BEDTIME FOR A WEEK, THEN TWO TIMES DAILY FOR A WEEK, THEN  THREE TIMES DAILY. MAY DOUBLE WEEKLY TO A MAX OF 3,$Remove'600MG'LBzEiFU$ /DAY 90 capsule 3  . levothyroxine (SYNTHROID) 25 MCG tablet TAKE 1 TABLET BY MOUTH EVERY DAY 30 tablet 3  . meloxicam (MOBIC) 15 MG tablet TAKE ONE TABLET BY MOUTH EACH AM WITH BREAKFAST AS NEEDED FOR PAIN 30 tablet 2  . Multiple Vitamin (MULTIVITAMIN) tablet Take 1 tablet by mouth daily.    Marland Kitchen MYRBETRIQ 25 MG TB24 tablet TAKE 1 TABLET BY MOUTH EVERYDAY AT BEDTIME 30 tablet 5  . nystatin (MYCOSTATIN/NYSTOP) powder Apply 1 gram to affected area 2 times daily. 60 g 11  . potassium chloride (KLOR-CON) 10 MEQ tablet TAKE 1 TABLET (10 MEQ TOTAL) BY MOUTH 2 (TWO) TIMES DAILY. 60 tablet 11  . rosuvastatin (CRESTOR) 10 MG tablet TAKE 1 TABLET BY MOUTH EVERYDAY AT BEDTIME 30 tablet 11  . senna (SENOKOT) 8.6 MG tablet Take 1 tablet by mouth at bedtime.     . traMADol (ULTRAM) 50 MG tablet Take 1-2 tablets  (50-100 mg total) by mouth 3 (three) times daily. 180 tablet 0  . TURMERIC PO Take 1,000 mg by mouth daily.    . vitamin B-12 (CYANOCOBALAMIN) 100 MCG tablet Take 100 mcg by mouth at bedtime.     . Vitamin D, Ergocalciferol, (DRISDOL) 1.25 MG (50000 UNIT) CAPS capsule TAKE 1 CAPSULE (50,000 UNITS TOTAL) BY MOUTH EVERY SUNDAY. 4 capsule 8  . amLODipine (NORVASC) 5 MG tablet TAKE 1 TABLET BY MOUTH EVERY DAY 90 tablet 2  . blood glucose meter kit and supplies KIT Dispense based on patient and insurance preference. Use up to once time daily as directed. (FOR ICD-9 250.00, 250.01). 1 each PRN  . hydroxychloroquine (PLAQUENIL) 200 MG tablet TALE 2 TABLETS BY MOUTH WITH FOOD OR MILK ONCE A DAY 60 tablet 6  . losartan (COZAAR) 100 MG tablet TAKE 1 TABLET BY MOUTH EVERY DAY 90 tablet 1  . metFORMIN (GLUCOPHAGE) 500 MG tablet Take 1 tablet (500 mg total) by mouth daily with breakfast. 90 tablet 0  . metoprolol succinate (TOPROL-XL) 25 MG 24 hr tablet Take 1 tablet (25 mg total) by mouth daily. Needs labs 90 tablet 0   No facility-administered medications prior to visit.    Allergies  Allergen Reactions  . Iodine Other (See Comments)    Shook violently and passed out.  . Codeine Other (See Comments)    Hallucinations  . Lipitor [Atorvastatin] Other (See Comments)    Myalgias    ROS Review of Systems    Objective:    Physical Exam Constitutional:      Appearance: She is well-developed.  HENT:     Head: Normocephalic and atraumatic.  Cardiovascular:     Rate and Rhythm: Normal rate and regular rhythm.     Heart sounds: Normal heart sounds.  Pulmonary:     Effort: Pulmonary effort is normal.     Breath sounds: Normal breath sounds.  Skin:    General: Skin is warm and dry.  Neurological:     Mental Status: She is alert and oriented to person, place, and time.  Psychiatric:        Behavior: Behavior normal.     BP (!) 78/62   Pulse (!) 58   Ht $R'5\' 2"'DV$  (1.575 m)   Wt 183 lb (83 kg)    SpO2 97%   BMI 33.47 kg/m  Wt Readings from Last 3 Encounters:  03/29/20 184 lb (83.5 kg)  03/27/20 183 lb (83 kg)  03/12/20 188 lb (  85.3 kg)     Health Maintenance Due  Topic Date Due  . Hepatitis C Screening  Never done  . OPHTHALMOLOGY EXAM  Never done  . COVID-19 Vaccine (3 - Booster for Pfizer series) 12/21/2019    There are no preventive care reminders to display for this patient.  Lab Results  Component Value Date   TSH 0.34 (L) 03/27/2020   Lab Results  Component Value Date   WBC 5.2 08/21/2019   HGB 11.9 08/21/2019   HCT 35.9 08/21/2019   MCV 90.0 08/21/2019   PLT 204 08/21/2019   Lab Results  Component Value Date   NA 139 03/27/2020   K 4.8 03/27/2020   CO2 24 03/27/2020   GLUCOSE 84 03/27/2020   BUN 13 03/27/2020   CREATININE 1.16 (H) 03/27/2020   BILITOT 0.7 08/21/2019   ALKPHOS 66 12/23/2017   AST 20 08/21/2019   ALT 14 08/21/2019   PROT 6.3 08/21/2019   ALBUMIN 3.7 12/23/2017   CALCIUM 9.4 03/27/2020   ANIONGAP 10 01/04/2018   Lab Results  Component Value Date   CHOL 141 08/21/2019   Lab Results  Component Value Date   HDL 80 08/21/2019   Lab Results  Component Value Date   LDLCALC 47 08/21/2019   Lab Results  Component Value Date   TRIG 56 08/21/2019   Lab Results  Component Value Date   CHOLHDL 1.8 08/21/2019   Lab Results  Component Value Date   HGBA1C 5.3 02/05/2020      Assessment & Plan:   Problem List Items Addressed This Visit      Cardiovascular and Mediastinum   Essential hypertension, benign - Primary    BP still been running low at home.  We have a couple of options we could the amlodipine or the losartan.  We discussed stopping the amlodipine for now completely.  And monitoring blood pressures over the next 2 weeks.  We will also change losartan to the 50 mg tab.      Relevant Medications   losartan (COZAAR) 50 MG tablet     Digestive   Gastroesophageal reflux disease with esophagitis without  hemorrhage    More frequent GERD symptoms recently.  Already takes omeprazole.  New prescription sent to pharmacy.  May need to adjust timing of the medication as she is having more reflux at night.      Relevant Medications   omeprazole (PRILOSEC) 40 MG capsule     Endocrine   Controlled diabetes mellitus type 2 with complications ([place])    Well controlled. Continue current regimen. Follow up in  4 months.   Lab Results  Component Value Date   HGBA1C 5.3 02/05/2020         Relevant Medications   losartan (COZAAR) 50 MG tablet   blood glucose meter kit and supplies KIT     Other   Sleep disturbance    Much improved.        Bilateral lower extremity edema    Doing OK overall.  duiretic is working well.           So encouraged her to work on getting up and moving more in the day and being active.  She does have home health physical therapist coming out but does not do a lot in between visits.  Meds ordered this encounter  Medications  . losartan (COZAAR) 50 MG tablet    Sig: Take 1 tablet (50 mg total) by mouth daily.    Dispense:  90 tablet    Refill:  0    Please d/c any refills for the 162m dose. Pt will call when needed.  .Marland Kitchenomeprazole (PRILOSEC) 40 MG capsule    Sig: Take 1 capsule (40 mg total) by mouth in the morning.    Dispense:  30 capsule    Refill:  0  . blood glucose meter kit and supplies KIT    Sig: Dispense based on patient and insurance preference. Use up to once time daily as directed. (FOR ICD-9 250.00, 250.01).    Dispense:  1 each    Refill:  PRN    Order Specific Question:   Number of strips    Answer:   100    Order Specific Question:   Number of lancets    Answer:   100    Follow-up: No follow-ups on file.    CBeatrice Lecher MD

## 2020-03-27 NOTE — Assessment & Plan Note (Addendum)
BP still been running low at home.  We have a couple of options we could the amlodipine or the losartan.  We discussed stopping the amlodipine for now completely.  And monitoring blood pressures over the next 2 weeks.  We will also change losartan to the 50 mg tab.

## 2020-03-27 NOTE — Progress Notes (Signed)
Pt's at home Vitals  BP:  100/56     P:   60 BS:  106 WT: 183.4

## 2020-03-27 NOTE — Patient Instructions (Signed)
Please stop your amlodipine completely.

## 2020-03-28 ENCOUNTER — Encounter: Payer: Self-pay | Admitting: Family Medicine

## 2020-03-28 ENCOUNTER — Other Ambulatory Visit: Payer: Self-pay | Admitting: *Deleted

## 2020-03-28 DIAGNOSIS — E038 Other specified hypothyroidism: Secondary | ICD-10-CM

## 2020-03-28 DIAGNOSIS — R7989 Other specified abnormal findings of blood chemistry: Secondary | ICD-10-CM

## 2020-03-28 LAB — BASIC METABOLIC PANEL WITH GFR
BUN/Creatinine Ratio: 11 (calc) (ref 6–22)
BUN: 13 mg/dL (ref 7–25)
CO2: 24 mmol/L (ref 20–32)
Calcium: 9.4 mg/dL (ref 8.6–10.4)
Chloride: 107 mmol/L (ref 98–110)
Creat: 1.16 mg/dL — ABNORMAL HIGH (ref 0.60–0.93)
GFR, Est African American: 53 mL/min/{1.73_m2} — ABNORMAL LOW (ref >=60)
GFR, Est Non African American: 45 mL/min/{1.73_m2} — ABNORMAL LOW (ref >=60)
Glucose, Bld: 84 mg/dL (ref 65–139)
Potassium: 4.8 mmol/L (ref 3.5–5.3)
Sodium: 139 mmol/L (ref 135–146)

## 2020-03-29 ENCOUNTER — Encounter: Payer: Self-pay | Admitting: Orthopaedic Surgery

## 2020-03-29 ENCOUNTER — Ambulatory Visit: Payer: Self-pay

## 2020-03-29 ENCOUNTER — Other Ambulatory Visit: Payer: Self-pay

## 2020-03-29 ENCOUNTER — Ambulatory Visit (INDEPENDENT_AMBULATORY_CARE_PROVIDER_SITE_OTHER): Payer: Medicare Other | Admitting: Orthopaedic Surgery

## 2020-03-29 VITALS — Ht 62.0 in | Wt 184.0 lb

## 2020-03-29 DIAGNOSIS — M19012 Primary osteoarthritis, left shoulder: Secondary | ICD-10-CM | POA: Diagnosis not present

## 2020-03-29 NOTE — Progress Notes (Signed)
Office Visit Note   Patient: Joann Maddox           Date of Birth: 1940/10/10           MRN: [id number] Visit Date: 03/29/2020              Requested by: Agapito Games, MD [address] [place],  [place] [address] PCP: Agapito Games, MD   Assessment & Plan: Visit Diagnoses:  1. Primary osteoarthritis of left shoulder     Plan: We discussed severe arthritic changes in her shoulder.  She is certainly not in any condition to consider a shoulder arthroplasty which would be required to help with the problems she has in with possible rotator cuff tear she might require reverse total shoulder arthroplasty.  She will continue to work on convalescence after represent discharge.  We reviewed x-rays on the right and also left shoulder as well as previous CT scan of her shoulder.  She will try to avoid outstretched and overhead activities with her left arm.  She can return if she has persistent symptoms.  Follow-Up Instructions: No follow-ups on file.   Orders:  Orders Placed This Encounter  Procedures  . XR Shoulder Left   No orders of the defined types were placed in this encounter.     Procedures: No procedures performed   Clinical Data: No additional findings.   Subjective: Chief Complaint  Patient presents with  . Left Shoulder - Pain    HPI 79 year old female returns and states she has had recent [facility] with problems with infection, kidney stones requiring stent placement, pneumonia.  She is in the hospital and then had to go through rehabilitation.  She has previous diagnosis of rheumatoid arthritis also type 2 diabetes.  She has been amatory with a rolling walker and has had increased pain with range of motion of her left shoulder.  Previously she was seen with right shoulder problems.  She had a CT of her shoulder December 2020 which showed severe glenohumeral arthritis with multiple ossified loose bodies and atrophy of the  infraspinatus and supraspinatus muscles suggestive of rotator cuff tears.  Two-level cervical fusion C5-6 C6-7 done 2018 healed solidly.  Patient is using a rolling walker.  Patient been on tramadol and also gabapentin help with the pain.  Review of Systems all other systems are negative as pertains HPI.   Objective: Vital Signs: Ht 5\' 2"  (1.575 m)   Wt 184 lb (83.5 kg)   BMI 33.65 kg/m   Physical Exam Constitutional:      Appearance: She is well-developed.  HENT:     Head: Normocephalic.     Right Ear: External ear normal.     Left Ear: External ear normal.  Eyes:     Pupils: Pupils are equal, round, and reactive to light.  Neck:     Thyroid: No thyromegaly.     Trachea: No tracheal deviation.  Cardiovascular:     Rate and Rhythm: Normal rate.  Pulmonary:     Effort: Pulmonary effort is normal.  Abdominal:     Palpations: Abdomen is soft.  Skin:    General: Skin is warm and dry.  Neurological:     Mental Status: She is alert and oriented to person, place, and time.  Psychiatric:        Behavior: Behavior normal.     Ortho Exam patient has pain with abduction of the shoulder positive impingement.  Weakness supraspinatus and  infraspinatus teres on testing.  Subscap test strong. Specialty Comments:  No specialty comments available.  Imaging: No results found.   PMFS History: Patient Active Problem List   Diagnosis Date Noted  . Iron deficiency anemia 06/22/2019  . Lymph edema 12/06/2018  . Sleep disturbance 12/05/2018  . Bilateral lower extremity edema 09/20/2018  . Obesity (BMI 30-39.9) 09/02/2018  . Varicose veins of both lower extremities 08/22/2018  . Depression, major, single episode, mild (HCC) 08/22/2018  . Falls frequently 08/12/2018  . Mass of thigh, right 05/17/2018  . 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  . Status post cervical spinal fusion 01/11/2018  . Neck pain 10/20/2017  . Chronic venous insufficiency 04/29/2017  .  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HCC) 02/12/2015  . Controlled diabetes mellitus type 2 with complications (HCC) 08/10/2014  . Chronic constipation 02/19/2014  . Rosacea 11/06/2013  . GAD (generalized anxiety disorder) 06/08/2013  . Essential hypertension, benign 05/04/2013  . Migraine with aura 05/04/2013  . Murmur, heart 05/04/2013  . Hyperlipidemia 05/04/2013  . Hypothyroidism 05/04/2013  . Fatigue 02/16/2012  . Obesity, morbid (HCC) 02/16/2012  . Urge incontinence 02/16/2012  . Hyperglycemia 01/12/2012   Past Medical History:  Diagnosis Date  . Anxiety   . Arthritis   . Depression   . Headache(784.0)    migrains  . Heart murmur   . Hypothyroidism   . Pre-diabetes   . Rheumatoid arthritis(714.0)     Family History  Problem Relation Age of Onset  . Heart disease Father 45  . Diabetes Other        aunt   . Stroke Mother 4    Past Surgical History:  Procedure Laterality Date  . ABDOMINAL HYSTERECTOMY    . ANTERIOR CERVICAL DECOMP/DISCECTOMY FUSION N/A 01/03/2018   Procedure: ANTERIOR CERVICAL DECOMPRESSION/DISCECTOMY FUSION, ALLOGRAFT, PLATE;  Surgeon: Eldred Manges, MD;  Location: [facility];  Service: Orthopedics;  Laterality: N/A;  . APPENDECTOMY    . CHOLECYSTECTOMY    . REPLACEMENT TOTAL KNEE BILATERAL    . TONSILLECTOMY     as  a child  . TOTAL HIP ARTHROPLASTY     right and left   Social History   Occupational History  . Occupation: Retired Runner, broadcasting/film/video    Comment: Corporate treasurer  Tobacco Use  . Smoking status: Never Smoker  . Smokeless tobacco: Never Used  Substance and Sexual Activity  . Alcohol use: No  . Drug use: Not on file  . Sexual activity: Not on file

## 2020-03-31 ENCOUNTER — Other Ambulatory Visit: Payer: Self-pay | Admitting: Family Medicine

## 2020-04-01 ENCOUNTER — Encounter: Payer: Self-pay | Admitting: Family Medicine

## 2020-04-02 ENCOUNTER — Encounter: Payer: Self-pay | Admitting: Family Medicine

## 2020-04-02 NOTE — Telephone Encounter (Signed)
FYI on vitals

## 2020-04-03 ENCOUNTER — Other Ambulatory Visit: Payer: Self-pay | Admitting: Family Medicine

## 2020-04-03 NOTE — Telephone Encounter (Signed)
Addressed the reflux in a different my chart note.

## 2020-04-04 ENCOUNTER — Other Ambulatory Visit: Payer: Self-pay | Admitting: Family Medicine

## 2020-04-05 ENCOUNTER — Encounter: Payer: Self-pay | Admitting: Family Medicine

## 2020-04-05 DIAGNOSIS — K2100 Gastro-esophageal reflux disease with esophagitis, without bleeding: Secondary | ICD-10-CM | POA: Insufficient documentation

## 2020-04-05 NOTE — Assessment & Plan Note (Signed)
Doing OK overall.  duiretic is working well.

## 2020-04-05 NOTE — Assessment & Plan Note (Signed)
Well controlled. Continue current regimen. Follow up in  4 months.   Lab Results  Component Value Date   HGBA1C 5.3 02/05/2020

## 2020-04-05 NOTE — Assessment & Plan Note (Signed)
Much improved

## 2020-04-05 NOTE — Assessment & Plan Note (Signed)
More frequent GERD symptoms recently.  Already takes omeprazole.  New prescription sent to pharmacy.  May need to adjust timing of the medication as she is having more reflux at night.

## 2020-04-08 ENCOUNTER — Other Ambulatory Visit: Payer: Self-pay | Admitting: Family Medicine

## 2020-04-08 ENCOUNTER — Encounter: Payer: Self-pay | Admitting: Family Medicine

## 2020-04-08 DIAGNOSIS — K2100 Gastro-esophageal reflux disease with esophagitis, without bleeding: Secondary | ICD-10-CM

## 2020-04-09 MED ORDER — OMEPRAZOLE 40 MG PO CPDR: 40.0000 mg | DELAYED_RELEASE_CAPSULE | Freq: Every morning | ORAL | 0 refills | Status: DC

## 2020-04-16 ENCOUNTER — Ambulatory Visit (INDEPENDENT_AMBULATORY_CARE_PROVIDER_SITE_OTHER): Payer: Medicare Other | Admitting: Family Medicine

## 2020-04-16 ENCOUNTER — Encounter: Payer: Self-pay | Admitting: Family Medicine

## 2020-04-16 ENCOUNTER — Other Ambulatory Visit: Payer: Self-pay

## 2020-04-16 VITALS — BP 114/62 | HR 52 | Ht 62.0 in | Wt 178.0 lb

## 2020-04-16 DIAGNOSIS — R319 Hematuria, unspecified: Secondary | ICD-10-CM

## 2020-04-16 DIAGNOSIS — R300 Dysuria: Secondary | ICD-10-CM

## 2020-04-16 DIAGNOSIS — J3489 Other specified disorders of nose and nasal sinuses: Secondary | ICD-10-CM | POA: Diagnosis not present

## 2020-04-16 LAB — POCT URINALYSIS DIP (CLINITEK)
Bilirubin, UA: NEGATIVE
Glucose, UA: NEGATIVE mg/dL
Ketones, POC UA: NEGATIVE mg/dL
Nitrite, UA: NEGATIVE
POC PROTEIN,UA: NEGATIVE
Spec Grav, UA: 1.015 (ref 1.010–1.025)
Urobilinogen, UA: 0.2 E.U./dL
pH, UA: 5.5 (ref 5.0–8.0)

## 2020-04-16 MED ORDER — NITROFURANTOIN MONOHYD MACRO 100 MG PO CAPS: 100.0000 mg | ORAL_CAPSULE | Freq: Two times a day (BID) | ORAL | 0 refills | Status: DC

## 2020-04-16 NOTE — Progress Notes (Signed)
Acute Office Visit  Subjective:    Patient ID: Joann Maddox, female    DOB: 02/22/1941, 79 y.o.   MRN: [id number]  Chief Complaint  Patient presents with  . Urinary Tract Infection    HPI Patient is in today for urinary sxs. sxs started 10 days ago. She said that they became worse 3 days ago. She c/o burning, cloudy urine. She hasn't tried any OTC medications.  No fevers or chills she has had some midline low back pain as well as some pelvic pressure.  No nausea vomiting.  She also notes a clear runny nose for couple of weeks but no other upper respiratory symptoms such as congestion sore throat ear pressure pain etc.  Past Medical History:  Diagnosis Date  . Anxiety   . Arthritis   . Depression   . Headache(784.0)    migrains  . Heart murmur   . Hypothyroidism   . Pre-diabetes   . Rheumatoid arthritis(714.0)     Past Surgical History:  Procedure Laterality Date  . ABDOMINAL HYSTERECTOMY    . ANTERIOR CERVICAL DECOMP/DISCECTOMY FUSION N/A 01/03/2018   Procedure: ANTERIOR CERVICAL DECOMPRESSION/DISCECTOMY FUSION, ALLOGRAFT, PLATE;  Surgeon: Marybelle Killings, MD;  Location: [facility];  Service: Orthopedics;  Laterality: N/A;  . APPENDECTOMY    . CHOLECYSTECTOMY    . REPLACEMENT TOTAL KNEE BILATERAL    . TONSILLECTOMY     as  a child  . TOTAL HIP ARTHROPLASTY     right and left    Family History  Problem Relation Age of Onset  . Heart disease Father 32  . Diabetes Other        aunt   . Stroke Mother 6    Social History   Socioeconomic History  . Marital status: Married    Spouse name: Sonia Side  . Number of children: 2  . Years of education: Not on file  . Highest education level: Not on file  Occupational History  . Occupation: Retired Pharmacist, hospital    Comment: Engineering geologist  Tobacco Use  . Smoking status: Never Smoker  . Smokeless tobacco: Never Used  Substance and Sexual Activity  . Alcohol use: No  . Drug use: Not on file  . Sexual activity: Not on file   Other Topics Concern  . Not on file  Social History Narrative   No regular exercise. 2 caffeine drinks per day.    Social Determinants of Health   Financial Resource Strain: Not on file  Food Insecurity: Not on file  Transportation Needs: Not on file  Physical Activity: Not on file  Stress: Not on file  Social Connections: Not on file  Intimate Partner Violence: Not on file    Outpatient Medications Prior to Visit  Medication Sig Dispense Refill  . AMBULATORY NON FORMULARY MEDICATION Rolator Walker use as needed for ambulation DX: R29.6 1 each 0  . azelaic acid (AZELEX) 20 % cream APPLY TOPICALL 2 TIMES A DAY (MORNING AND EVENING). APPLY AFTER SKIN IS WASHED AND PATTED DRY. 30 g 6  . blood glucose meter kit and supplies KIT Dispense based on patient and insurance preference. Use up to once time daily as directed. (FOR ICD-9 250.00, 250.01). 1 each PRN  . bumetanide (BUMEX) 1 MG tablet TAKE 1 TABLET (1 MG TOTAL) BY MOUTH IN THE MORNING. 90 tablet 1  . diclofenac sodium (VOLTAREN) 1 % GEL Apply 2 g topically 4 (four) times daily. To affected joint. 100 g 11  . FLUoxetine (  PROZAC) 20 MG tablet TAKE 1 TABLET BY MOUTH EVERY DAY 30 tablet 5  . folic acid (FOLVITE) 1 MG tablet TAKE 1 TABLET BY MOUTH EVERY DAY 90 tablet 3  . gabapentin (NEURONTIN) 300 MG capsule TAKE 1 CAPSULE (300 MG TOTAL) BY MOUTH AT BEDTIME. ONE TAB BY MOUTH DAILY AT BEDTIME FOR A WEEK, THEN TWO TIMES DAILY FOR A WEEK, THEN THREE TIMES DAILY. MAY DOUBLE WEEKLY TO A MAX OF 3,600MG/DAY 90 capsule 3  . hydroxychloroquine (PLAQUENIL) 200 MG tablet TALE 2 TABLETS BY MOUTH WITH FOOD OR MILK ONCE A DAY 60 tablet 6  . levothyroxine (SYNTHROID) 25 MCG tablet TAKE 1 TABLET BY MOUTH EVERY DAY 30 tablet 3  . losartan (COZAAR) 50 MG tablet Take 1 tablet (50 mg total) by mouth daily. 90 tablet 0  . meloxicam (MOBIC) 15 MG tablet TAKE ONE TABLET BY MOUTH EACH AM WITH BREAKFAST AS NEEDED FOR PAIN 30 tablet 2  . metoprolol succinate  (TOPROL-XL) 25 MG 24 hr tablet Take 1 tablet (25 mg total) by mouth daily. 90 tablet 1  . Multiple Vitamin (MULTIVITAMIN) tablet Take 1 tablet by mouth daily.    Marland Kitchen MYRBETRIQ 25 MG TB24 tablet TAKE 1 TABLET BY MOUTH EVERYDAY AT BEDTIME 30 tablet 5  . nystatin (MYCOSTATIN/NYSTOP) powder Apply 1 gram to affected area 2 times daily. 60 g 11  . omeprazole (PRILOSEC) 40 MG capsule Take 1 capsule (40 mg total) by mouth in the morning. 30 capsule 0  . potassium chloride (KLOR-CON) 10 MEQ tablet TAKE 1 TABLET (10 MEQ TOTAL) BY MOUTH 2 (TWO) TIMES DAILY. 60 tablet 11  . rosuvastatin (CRESTOR) 10 MG tablet TAKE 1 TABLET BY MOUTH EVERYDAY AT BEDTIME 30 tablet 11  . senna (SENOKOT) 8.6 MG tablet Take 1 tablet by mouth at bedtime.     . traMADol (ULTRAM) 50 MG tablet Take 1-2 tablets (50-100 mg total) by mouth 3 (three) times daily. 180 tablet 0  . TURMERIC PO Take 1,000 mg by mouth daily.    . vitamin B-12 (CYANOCOBALAMIN) 100 MCG tablet Take 100 mcg by mouth at bedtime.     . Vitamin D, Ergocalciferol, (DRISDOL) 1.25 MG (50000 UNIT) CAPS capsule TAKE 1 CAPSULE (50,000 UNITS TOTAL) BY MOUTH EVERY SUNDAY. 4 capsule 8   No facility-administered medications prior to visit.    Allergies  Allergen Reactions  . Iodine Other (See Comments)    Shook violently and passed out.  . Codeine Other (See Comments)    Hallucinations  . Lipitor [Atorvastatin] Other (See Comments)    Myalgias    Review of Systems     Objective:    Physical Exam Vitals reviewed.  Constitutional:      Appearance: She is well-developed and well-nourished.  HENT:     Head: Normocephalic and atraumatic.  Eyes:     Extraocular Movements: EOM normal.     Conjunctiva/sclera: Conjunctivae normal.  Cardiovascular:     Rate and Rhythm: Normal rate.  Pulmonary:     Effort: Pulmonary effort is normal.  Skin:    General: Skin is dry.     Coloration: Skin is not pale.  Neurological:     Mental Status: She is alert and oriented to  person, place, and time.  Psychiatric:        Mood and Affect: Mood and affect normal.        Behavior: Behavior normal.     BP 114/62   Pulse (!) 52   Ht 5' 2"  (1.575  m)   Wt 178 lb (80.7 kg)   SpO2 100%   BMI 32.56 kg/m  Wt Readings from Last 3 Encounters:  04/16/20 178 lb (80.7 kg)  03/29/20 184 lb (83.5 kg)  03/27/20 183 lb (83 kg)    Health Maintenance Due  Topic Date Due  . Hepatitis C Screening  Never done  . OPHTHALMOLOGY EXAM  Never done  . COVID-19 Vaccine (3 - Booster for Pfizer series) 12/21/2019  . FOOT EXAM  04/12/2020    There are no preventive care reminders to display for this patient.   Lab Results  Component Value Date   TSH 0.34 (L) 03/27/2020   Lab Results  Component Value Date   WBC 5.2 08/21/2019   HGB 11.9 08/21/2019   HCT 35.9 08/21/2019   MCV 90.0 08/21/2019   PLT 204 08/21/2019   Lab Results  Component Value Date   NA 139 03/27/2020   K 4.8 03/27/2020   CO2 24 03/27/2020   GLUCOSE 84 03/27/2020   BUN 13 03/27/2020   CREATININE 1.16 (H) 03/27/2020   BILITOT 0.7 08/21/2019   ALKPHOS 66 12/23/2017   AST 20 08/21/2019   ALT 14 08/21/2019   PROT 6.3 08/21/2019   ALBUMIN 3.7 12/23/2017   CALCIUM 9.4 03/27/2020   ANIONGAP 10 01/04/2018   Lab Results  Component Value Date   CHOL 141 08/21/2019   Lab Results  Component Value Date   HDL 80 08/21/2019   Lab Results  Component Value Date   LDLCALC 47 08/21/2019   Lab Results  Component Value Date   TRIG 56 08/21/2019   Lab Results  Component Value Date   CHOLHDL 1.8 08/21/2019   Lab Results  Component Value Date   HGBA1C 5.3 02/05/2020       Assessment & Plan:   Problem List Items Addressed This Visit   None   Visit Diagnoses    Burning with urination    -  Primary   Relevant Orders   POCT URINALYSIS DIP (CLINITEK) (Completed)   Urine Culture   Dysuria       Relevant Medications   nitrofurantoin, macrocrystal-monohydrate, (MACROBID) 100 MG capsule    Other Relevant Orders   Urine Culture   Hematuria, unspecified type       Relevant Orders   Urine Culture   Rhinorrhea         Dysuria-urinalysis shows blood and leukocytes negative for nitrites.  Possible UTI since she is getting ready to travel out of the state for the holidays and get a go ahead and send over prescription to the pharmacy.  We will treat with Macrobid.  Will send for culture will call with results once available.  Continue to hydrate well.  Rhinorrhea-likely due to change in weather it does not sound like it is related to any upper respiratory symptoms.  Continue to monitor.  Meds ordered this encounter  Medications  . nitrofurantoin, macrocrystal-monohydrate, (MACROBID) 100 MG capsule    Sig: Take 1 capsule (100 mg total) by mouth 2 (two) times daily.    Dispense:  10 capsule    Refill:  0     Beatrice Lecher, MD

## 2020-04-16 NOTE — Progress Notes (Signed)
sxs started 10 days ago. She said that they became worse 3 days ago. She c/o burning, cloudy urine  She hasn't tried any OTC medications

## 2020-04-17 ENCOUNTER — Other Ambulatory Visit: Payer: Self-pay | Admitting: Family Medicine

## 2020-04-17 DIAGNOSIS — R600 Localized edema: Secondary | ICD-10-CM

## 2020-04-17 DIAGNOSIS — I890 Lymphedema, not elsewhere classified: Secondary | ICD-10-CM

## 2020-04-18 ENCOUNTER — Encounter: Payer: Self-pay | Admitting: Family Medicine

## 2020-04-18 LAB — URINE CULTURE
MICRO NUMBER:: 11346418
SPECIMEN QUALITY:: ADEQUATE

## 2020-04-24 DIAGNOSIS — Z48816 Encounter for surgical aftercare following surgery on the genitourinary system: Secondary | ICD-10-CM | POA: Diagnosis not present

## 2020-04-25 ENCOUNTER — Other Ambulatory Visit: Payer: Self-pay | Admitting: Nurse Practitioner

## 2020-04-25 DIAGNOSIS — K2100 Gastro-esophageal reflux disease with esophagitis, without bleeding: Secondary | ICD-10-CM

## 2020-05-03 ENCOUNTER — Encounter: Payer: Self-pay | Admitting: Family Medicine

## 2020-05-03 DIAGNOSIS — R2681 Unsteadiness on feet: Secondary | ICD-10-CM

## 2020-05-03 NOTE — Telephone Encounter (Signed)
Pended home health physical therapy orders, please review and sign if appropriate for transfer training.

## 2020-05-07 ENCOUNTER — Ambulatory Visit: Payer: Medicare Other | Admitting: Family Medicine

## 2020-05-07 ENCOUNTER — Other Ambulatory Visit: Payer: Self-pay | Admitting: Family Medicine

## 2020-05-07 DIAGNOSIS — E038 Other specified hypothyroidism: Secondary | ICD-10-CM

## 2020-05-08 ENCOUNTER — Other Ambulatory Visit: Payer: Self-pay

## 2020-05-08 ENCOUNTER — Ambulatory Visit (INDEPENDENT_AMBULATORY_CARE_PROVIDER_SITE_OTHER): Payer: Medicare Other | Admitting: Family Medicine

## 2020-05-08 ENCOUNTER — Encounter: Payer: Self-pay | Admitting: Family Medicine

## 2020-05-08 VITALS — BP 110/52 | HR 58 | Temp 97.9°F | Wt 176.0 lb

## 2020-05-08 DIAGNOSIS — R3129 Other microscopic hematuria: Secondary | ICD-10-CM

## 2020-05-08 DIAGNOSIS — R300 Dysuria: Secondary | ICD-10-CM

## 2020-05-08 LAB — POCT URINALYSIS DIP (CLINITEK)
Bilirubin, UA: NEGATIVE
Glucose, UA: NEGATIVE mg/dL
Ketones, POC UA: NEGATIVE mg/dL
Nitrite, UA: NEGATIVE
POC PROTEIN,UA: 100 — AB
Spec Grav, UA: 1.02 (ref 1.010–1.025)
Urobilinogen, UA: 0.2 E.U./dL
pH, UA: 7 (ref 5.0–8.0)

## 2020-05-08 MED ORDER — CEPHALEXIN 500 MG PO CAPS: 500.0000 mg | ORAL_CAPSULE | Freq: Three times a day (TID) | ORAL | 0 refills | Status: DC

## 2020-05-08 NOTE — Patient Instructions (Signed)
Please call [facility] and schedule a follow-up appointment.     DR. Myles Lipps, Majid put in your stents.  You can follow up with him or with one of his partners if he is not available.

## 2020-05-08 NOTE — Progress Notes (Signed)
Acute Office Visit  Subjective:    Patient ID: Joann Maddox, female    DOB: 11-19-1940, 80 y.o.   MRN: [id number]  Chief Complaint  Patient presents with  . Dysuria    HPI Patient is in today for burning with urination and pressure and frequency.  We actually saw her about 3 weeks ago for urinary symptoms.  Urine culture ended up coming back negative we treated her with Macrobid.  She says her symptoms actually did improve significantly until about a week and a half ago when they started to come back.  No fever, chills or sweats.  She does have a urinary stent in place and has not actually followed back up with a urologist.  She says that they were not given a follow-up appointment after her last hospitalization.  She is worried that she may be getting another stone.  Past Medical History:  Diagnosis Date  . Anxiety   . Arthritis   . Depression   . Headache(784.0)    migrains  . Heart murmur   . Hypothyroidism   . Pre-diabetes   . Rheumatoid arthritis(714.0)     Past Surgical History:  Procedure Laterality Date  . ABDOMINAL HYSTERECTOMY    . ANTERIOR CERVICAL DECOMP/DISCECTOMY FUSION N/A 01/03/2018   Procedure: ANTERIOR CERVICAL DECOMPRESSION/DISCECTOMY FUSION, ALLOGRAFT, PLATE;  Surgeon: Marybelle Killings, MD;  Location: [place];  Service: Orthopedics;  Laterality: N/A;  . APPENDECTOMY    . CHOLECYSTECTOMY    . REPLACEMENT TOTAL KNEE BILATERAL    . TONSILLECTOMY     as  a child  . TOTAL HIP ARTHROPLASTY     right and left    Family History  Problem Relation Age of Onset  . Heart disease Father 47  . Diabetes Other        aunt   . Stroke Mother 69    Social History   Socioeconomic History  . Marital status: Married    Spouse name: Sonia Side  . Number of children: 2  . Years of education: Not on file  . Highest education level: Not on file  Occupational History  . Occupation: Retired Pharmacist, hospital    Comment: Engineering geologist  Tobacco Use  . Smoking status: Never Smoker   . Smokeless tobacco: Never Used  Substance and Sexual Activity  . Alcohol use: No  . Drug use: Not on file  . Sexual activity: Not on file  Other Topics Concern  . Not on file  Social History Narrative   No regular exercise. 2 caffeine drinks per day.    Social Determinants of Health   Financial Resource Strain: Not on file  Food Insecurity: Not on file  Transportation Needs: Not on file  Physical Activity: Not on file  Stress: Not on file  Social Connections: Not on file  Intimate Partner Violence: Not on file    Outpatient Medications Prior to Visit  Medication Sig Dispense Refill  . AMBULATORY NON FORMULARY MEDICATION Rolator Walker use as needed for ambulation DX: R29.6 1 each 0  . azelaic acid (AZELEX) 20 % cream APPLY TOPICALL 2 TIMES A DAY (MORNING AND EVENING). APPLY AFTER SKIN IS WASHED AND PATTED DRY. 30 g 6  . blood glucose meter kit and supplies KIT Dispense based on patient and insurance preference. Use up to once time daily as directed. (FOR ICD-9 250.00, 250.01). 1 each PRN  . bumetanide (BUMEX) 1 MG tablet TAKE 1 TABLET (1 MG TOTAL) BY MOUTH IN THE MORNING. [place]  tablet 2  . diclofenac sodium (VOLTAREN) 1 % GEL Apply 2 g topically 4 (four) times daily. To affected joint. 100 g 11  . FLUoxetine (PROZAC) 20 MG tablet TAKE 1 TABLET BY MOUTH EVERY DAY 30 tablet 5  . folic acid (FOLVITE) 1 MG tablet TAKE 1 TABLET BY MOUTH EVERY DAY 90 tablet 3  . gabapentin (NEURONTIN) 300 MG capsule TAKE 1 CAPSULE (300 MG TOTAL) BY MOUTH AT BEDTIME. ONE TAB BY MOUTH DAILY AT BEDTIME FOR A WEEK, THEN TWO TIMES DAILY FOR A WEEK, THEN THREE TIMES DAILY. MAY DOUBLE WEEKLY TO A MAX OF 3,600MG/DAY 90 capsule 3  . hydroxychloroquine (PLAQUENIL) 200 MG tablet TALE 2 TABLETS BY MOUTH WITH FOOD OR MILK ONCE A DAY 60 tablet 6  . levothyroxine (SYNTHROID) 25 MCG tablet TAKE 1 TABLET BY MOUTH EVERY DAY 30 tablet 3  . losartan (COZAAR) 50 MG tablet Take 1 tablet (50 mg total) by mouth daily. 90 tablet 0   . meloxicam (MOBIC) 15 MG tablet TAKE ONE TABLET BY MOUTH EACH AM WITH BREAKFAST AS NEEDED FOR PAIN 30 tablet 2  . metoprolol succinate (TOPROL-XL) 25 MG 24 hr tablet Take 1 tablet (25 mg total) by mouth daily. 90 tablet 1  . Multiple Vitamin (MULTIVITAMIN) tablet Take 1 tablet by mouth daily.    Marland Kitchen MYRBETRIQ 25 MG TB24 tablet TAKE 1 TABLET BY MOUTH EVERYDAY AT BEDTIME 30 tablet 5  . nystatin (MYCOSTATIN/NYSTOP) powder Apply 1 gram to affected area 2 times daily. 60 g 11  . omeprazole (PRILOSEC) 40 MG capsule TAKE 1 CAPSULE (40 MG TOTAL) BY MOUTH IN THE MORNING. 90 capsule 1  . potassium chloride (KLOR-CON) 10 MEQ tablet TAKE 1 TABLET (10 MEQ TOTAL) BY MOUTH 2 (TWO) TIMES DAILY. 60 tablet 11  . rosuvastatin (CRESTOR) 10 MG tablet TAKE 1 TABLET BY MOUTH EVERYDAY AT BEDTIME 90 tablet 4  . senna (SENOKOT) 8.6 MG tablet Take 1 tablet by mouth at bedtime.     . traMADol (ULTRAM) 50 MG tablet Take 1-2 tablets (50-100 mg total) by mouth 3 (three) times daily. 180 tablet 0  . TURMERIC PO Take 1,000 mg by mouth daily.    . vitamin B-12 (CYANOCOBALAMIN) 100 MCG tablet Take 100 mcg by mouth at bedtime.     . Vitamin D, Ergocalciferol, (DRISDOL) 1.25 MG (50000 UNIT) CAPS capsule TAKE 1 CAPSULE (50,000 UNITS TOTAL) BY MOUTH EVERY SUNDAY. 4 capsule 8  . nitrofurantoin, macrocrystal-monohydrate, (MACROBID) 100 MG capsule Take 1 capsule (100 mg total) by mouth 2 (two) times daily. 10 capsule 0   No facility-administered medications prior to visit.    Allergies  Allergen Reactions  . Iodine Other (See Comments)    Shook violently and passed out.  . Codeine Other (See Comments)    Hallucinations  . Lipitor [Atorvastatin] Other (See Comments)    Myalgias    Review of Systems     Objective:    Physical Exam Vitals reviewed.  Constitutional:      Appearance: She is well-developed and well-nourished.  HENT:     Head: Normocephalic and atraumatic.  Eyes:     Extraocular Movements: EOM normal.      Conjunctiva/sclera: Conjunctivae normal.  Cardiovascular:     Rate and Rhythm: Normal rate.  Pulmonary:     Effort: Pulmonary effort is normal.  Skin:    General: Skin is dry.     Coloration: Skin is not pale.  Neurological:     Mental Status: She is alert  and oriented to person, place, and time.  Psychiatric:        Mood and Affect: Mood and affect normal.        Behavior: Behavior normal.     BP (!) 110/52   Pulse (!) 58   Temp 97.9 F (36.6 C) (Oral)   Wt 176 lb (79.8 kg)   SpO2 100%   BMI 32.19 kg/m  Wt Readings from Last 3 Encounters:  05/08/20 176 lb (79.8 kg)  04/16/20 178 lb (80.7 kg)  03/29/20 184 lb (83.5 kg)    Health Maintenance Due  Topic Date Due  . Hepatitis C Screening  Never done  . OPHTHALMOLOGY EXAM  Never done  . FOOT EXAM  04/12/2020    There are no preventive care reminders to display for this patient.   Lab Results  Component Value Date   TSH 0.34 (L) 03/27/2020   Lab Results  Component Value Date   WBC 5.2 08/21/2019   HGB 11.9 08/21/2019   HCT 35.9 08/21/2019   MCV 90.0 08/21/2019   PLT 204 08/21/2019   Lab Results  Component Value Date   NA 139 03/27/2020   K 4.8 03/27/2020   CO2 24 03/27/2020   GLUCOSE 84 03/27/2020   BUN 13 03/27/2020   CREATININE 1.16 (H) 03/27/2020   BILITOT 0.7 08/21/2019   ALKPHOS 66 12/23/2017   AST 20 08/21/2019   ALT 14 08/21/2019   PROT 6.3 08/21/2019   ALBUMIN 3.7 12/23/2017   CALCIUM 9.4 03/27/2020   ANIONGAP 10 01/04/2018   Lab Results  Component Value Date   CHOL 141 08/21/2019   Lab Results  Component Value Date   HDL 80 08/21/2019   Lab Results  Component Value Date   LDLCALC 47 08/21/2019   Lab Results  Component Value Date   TRIG 56 08/21/2019   Lab Results  Component Value Date   CHOLHDL 1.8 08/21/2019   Lab Results  Component Value Date   HGBA1C 5.3 02/05/2020       Assessment & Plan:   Problem List Items Addressed This Visit   None   Visit Diagnoses     Burning with urination    -  Primary   Relevant Orders   POCT URINALYSIS DIP (CLINITEK) (Completed)   Urine Culture   Urinalysis, microscopic only   Other microscopic hematuria       Relevant Orders   POCT URINALYSIS DIP (CLINITEK) (Completed)   Urine Culture   Urinalysis, microscopic only     Dysuria-likely consistent with UTI because she is quite uncomfortable with urination I did go ahead and opt to treat her today with Keflex she just recently had a prescription for Macrobid.  We will send for culture.  She also had hematuria for the second time in a row some also going to send this for microscopic review that she does have a stent in place which could be the cause.  Kidney stone-recommended that she follow back up with urology as she has not seen them as outpatient since the stent placement on October 21.  She was not sure who actually put them in's.  I was able to get the information from epic and wrote it down for her to call to get in with Dr. Marnee Guarneri at [facility] urology.   Meds ordered this encounter  Medications  . cephALEXin (KEFLEX) 500 MG capsule    Sig: Take 1 capsule (500 mg total) by mouth 3 (three) times daily.  Dispense:  6 capsule    Refill:  0     Beatrice Lecher, MD

## 2020-05-10 LAB — URINALYSIS, MICROSCOPIC ONLY

## 2020-05-10 LAB — URINE CULTURE
MICRO NUMBER:: 11412509
SPECIMEN QUALITY:: ADEQUATE

## 2020-05-28 ENCOUNTER — Encounter: Payer: Self-pay | Admitting: Family Medicine

## 2020-05-30 ENCOUNTER — Other Ambulatory Visit: Payer: Self-pay | Admitting: Family Medicine

## 2020-05-30 DIAGNOSIS — I10 Essential (primary) hypertension: Secondary | ICD-10-CM

## 2020-06-04 IMAGING — MR MR CERVICAL SPINE W/O CM
4 of 5 series · 29 of 48 positions shown · non-contrast
Comparison: MRI cervical spine 08/31/2015

CLINICAL DATA: Neck pain bilateral shoulder pain

EXAM:
MRI CERVICAL SPINE WITHOUT CONTRAST
TECHNIQUE: Multiplanar, multisequence MR imaging of the cervical spine was
performed. No intravenous contrast was administered.

[Series 3: T2 · sagittal · 3.0mm · 0.66mm/px · 8 of 15 slices shown (1 of 2)]
[im 1/15]
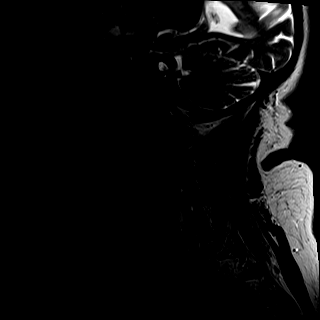
[im 3/15]
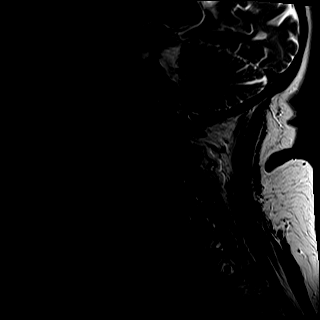
[im 5/15]
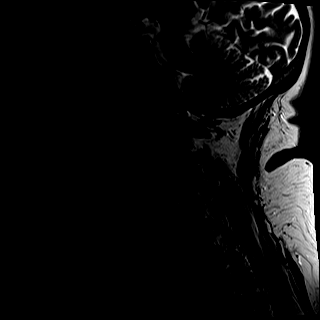
[im 7/15]
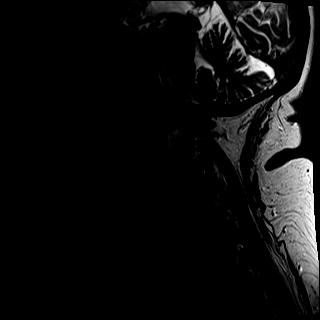
[im 9/15]
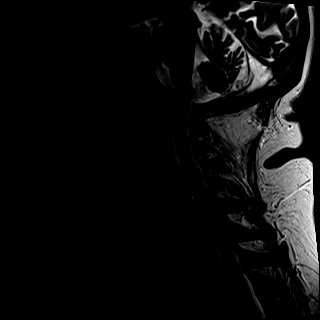
[im 11/15]
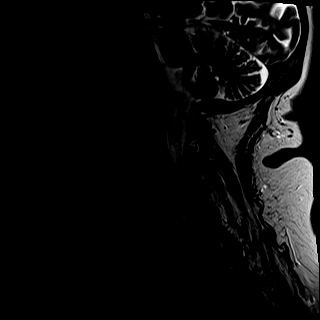
[im 13/15]
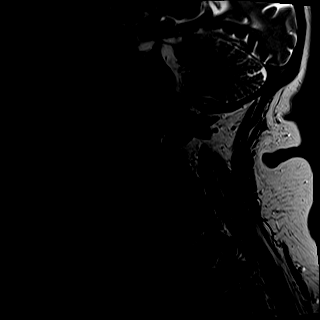
[im 15/15]
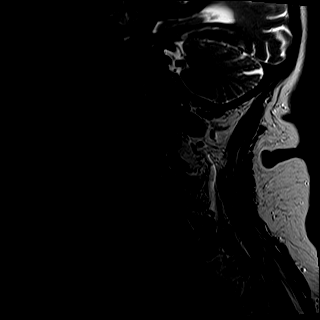

[Series 4: STIR · sagittal · 3.0mm · 0.41mm/px · 5 of 15 slices shown]
[im 1/15]
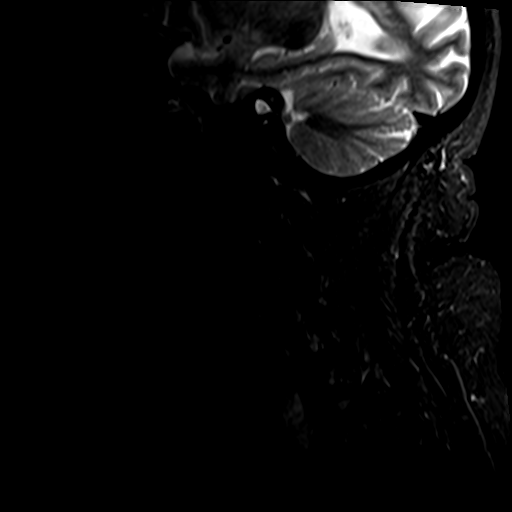
[im 3/15]
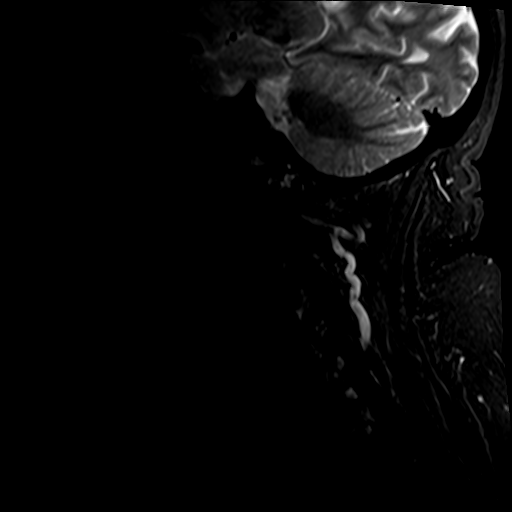
[im 5/15]
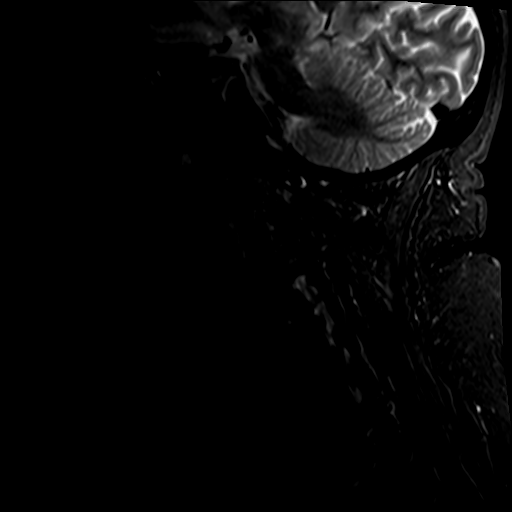
[im 8/15]
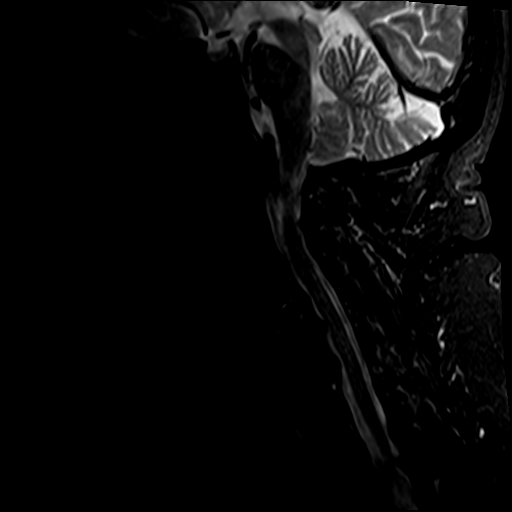
[im 12/15]
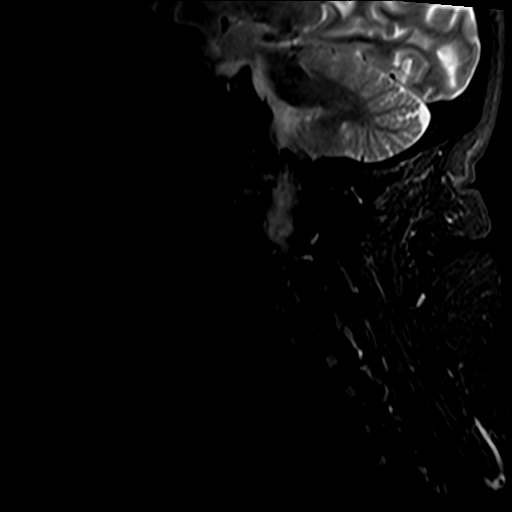

[Series 5: T1 · sagittal · 3.0mm · 0.41mm/px · 7 of 15 slices shown]
[im 1/15]
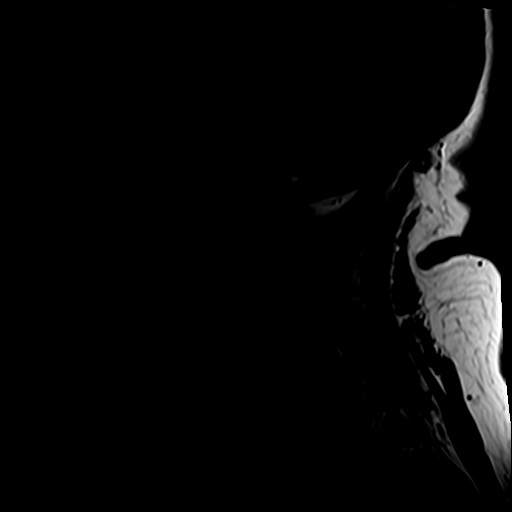
[im 3/15]
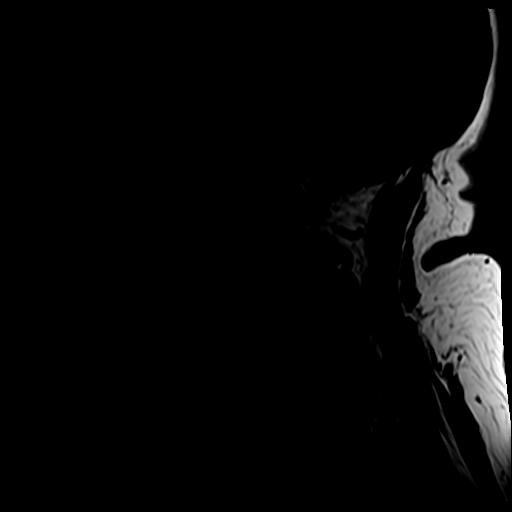
[im 5/15]
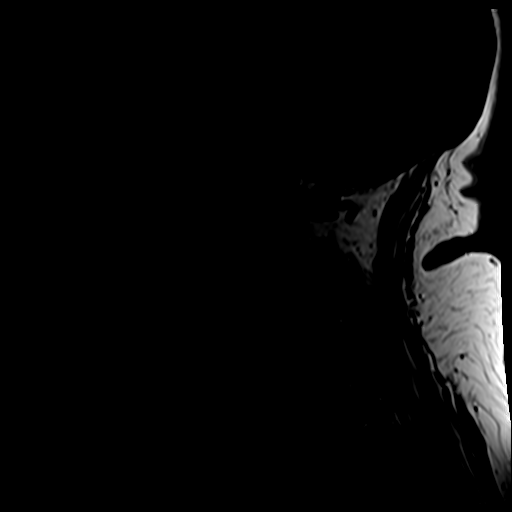
[im 8/15]
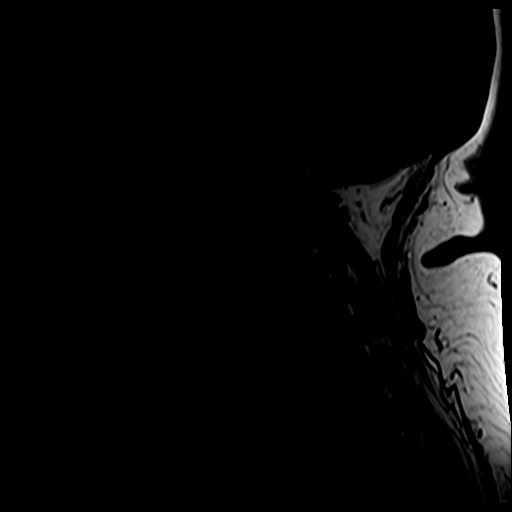
[im 10/15]
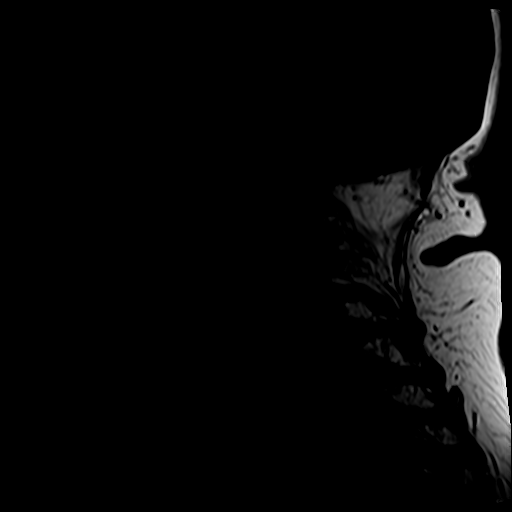
[im 12/15]
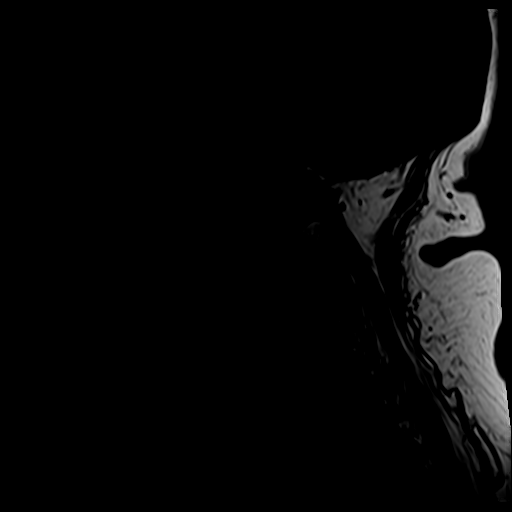
[im 15/15]
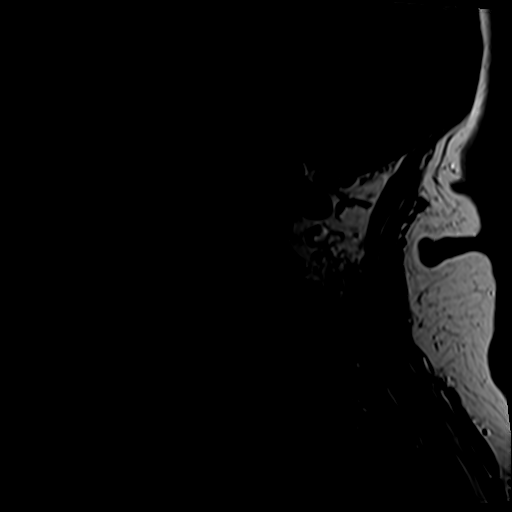

[Series 7: T2 · axial · 3.0mm · 0.70mm/px · z∈[-59,+34]mm · 9 of 28 slices shown (2 of 2)]
[im 1/28]
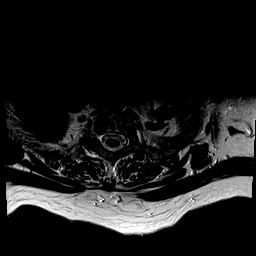
[im 5/28]
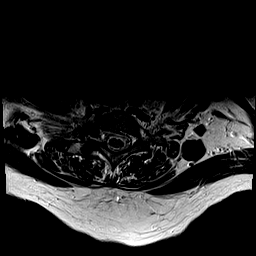
[im 10/28]
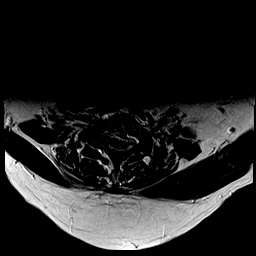
[im 12/28]
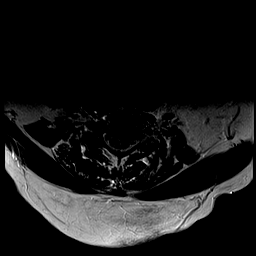
[im 14/28]
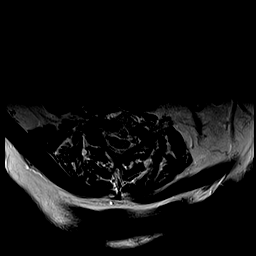
[im 16/28]
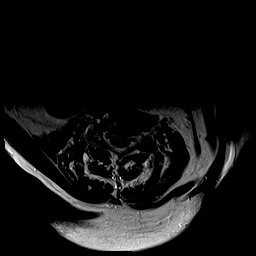
[im 19/28]
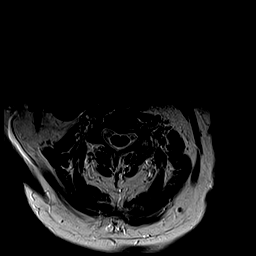
[im 23/28]
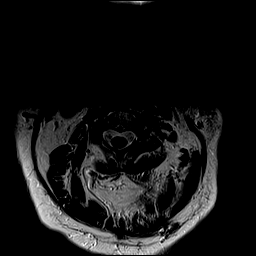
[im 28/28]
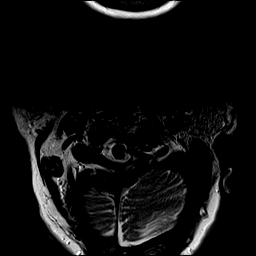

[29 of 48 positions shown; findings below may reference images not displayed]

FINDINGS: Alignment: Normal alignment. Straightening of the cervical lordosis
with slight kyphosis.

Vertebrae: Normal bone marrow.  Negative for fracture or mass

Cord: Normal spinal cord signal without focal cord lesion.

Posterior Fossa, vertebral arteries, paraspinal tissues: Negative

Disc levels:

C1-2: Advanced asymmetric arthropathy on the left with marked bony
overgrowth which has progressed. This is causing marked left
foraminal encroachment at C1-2.

C2-3: Mild facet degeneration on the left with mild left foraminal
narrowing

C3-4: Moderate left facet hypertrophy with mild left foraminal
narrowing

C4-5: Small central disc protrusion and spurring unchanged. Mild
facet degeneration on the right. No significant stenosis

C5-6: Central left-sided osteophyte. Cord flattening on the left
with moderate left foraminal encroachment unchanged from the prior
study.

C6-7: Prominent left-sided uncinate spur causing moderate to severe
left foraminal encroachment unchanged from the prior study. Cord
flattening on the left with mild spinal stenosis

C7-T1: Mild right foraminal narrowing due to facet degeneration.
IMPRESSION: Advanced degenerative changes C1-2 on the left with marked bony
overgrowth and left foraminal encroachment. This has progressed
since 8559

Mild left foraminal narrowing C2-3 and C3-4 due to facet
degeneration and spurring

Left-sided osteophyte at C5-6 with moderate left foraminal
encroachment unchanged.

Prominent left-sided uncinate spur C6-7 with moderate to severe left
foraminal encroachment and mild spinal stenosis unchanged from the
prior MRI.

## 2020-06-17 ENCOUNTER — Inpatient Hospital Stay: Payer: Medicare Other | Admitting: Family Medicine

## 2020-06-19 ENCOUNTER — Ambulatory Visit (INDEPENDENT_AMBULATORY_CARE_PROVIDER_SITE_OTHER): Payer: Medicare Other | Admitting: Family Medicine

## 2020-06-19 ENCOUNTER — Other Ambulatory Visit: Payer: Self-pay

## 2020-06-19 ENCOUNTER — Encounter: Payer: Self-pay | Admitting: Family Medicine

## 2020-06-19 VITALS — BP 106/43 | HR 123 | Ht 62.0 in | Wt 169.0 lb

## 2020-06-19 DIAGNOSIS — I959 Hypotension, unspecified: Secondary | ICD-10-CM

## 2020-06-19 DIAGNOSIS — F411 Generalized anxiety disorder: Secondary | ICD-10-CM | POA: Diagnosis not present

## 2020-06-19 DIAGNOSIS — R350 Frequency of micturition: Secondary | ICD-10-CM

## 2020-06-19 DIAGNOSIS — R8290 Unspecified abnormal findings in urine: Secondary | ICD-10-CM | POA: Diagnosis not present

## 2020-06-19 DIAGNOSIS — R519 Headache, unspecified: Secondary | ICD-10-CM

## 2020-06-19 LAB — POCT URINALYSIS DIP (CLINITEK)
Bilirubin, UA: NEGATIVE
Glucose, UA: NEGATIVE mg/dL
Ketones, POC UA: NEGATIVE mg/dL
Nitrite, UA: NEGATIVE
POC PROTEIN,UA: 100 — AB
Spec Grav, UA: 1.025 (ref 1.010–1.025)
Urobilinogen, UA: 0.2 E.U./dL
pH, UA: 7.5 (ref 5.0–8.0)

## 2020-06-19 MED ORDER — FLUOXETINE HCL 20 MG PO TABS: 20.0000 mg | ORAL_TABLET | Freq: Every day | ORAL | 3 refills | Status: DC

## 2020-06-19 NOTE — Progress Notes (Signed)
Established Patient Office Visit  Subjective:  Patient ID: Joann Maddox, female    DOB: [birth year]  Age: 80 y.o. MRN: [id number]  CC:  Chief Complaint  Patient presents with  . Follow-up  . Edema  . pain in head    HPI Joann Maddox presents for low up of lithotripsy.  She did end up seeing urology for the left ureteral stone.  They ended up doing lithotripsy at the end of last week.  She has noted she is had a little bit harder time urinating since then.  Have a follow-up coming up on March 4 May also placed a stent and hopefully will remove it that day.  She has also had a right-sided headache since having the lithotripsy done.  She also noticed recent increase onset of lower extremity edema over the last week or 2.  She normally takes a half tab of Bumex daily.  Monitoring her weights and those have been steady so even though she is swelling more her weight has been stable.  She is actually been trying to eat more healthy though so I do think she has been losing some weight.  Has been brought in a new medication list that he typed up that separates out the a.m., p.m. and bedtime drugs.  Reports that she is been having right-sided headaches also for about the last week or 2.  I seems to be on the same Maddox.  Her husband has been applying Voltaren gel and lidocaine to her neck and then rubbing it and and that actually seems to help relieve the headaches.  Past Medical History:  Diagnosis Date  . Anxiety   . Arthritis   . Depression   . Headache(784.0)    migrains  . Heart murmur   . Hypothyroidism   . Pre-diabetes   . Rheumatoid arthritis(714.0)     Past Surgical History:  Procedure Laterality Date  . ABDOMINAL HYSTERECTOMY    . ANTERIOR CERVICAL DECOMP/DISCECTOMY FUSION N/A 01/03/2018   Procedure: ANTERIOR CERVICAL DECOMPRESSION/DISCECTOMY FUSION, ALLOGRAFT, PLATE;  Surgeon: Marybelle Killings, MD;  Location: [place];  Service: Orthopedics;  Laterality: N/A;  . APPENDECTOMY     . CHOLECYSTECTOMY    . REPLACEMENT TOTAL KNEE BILATERAL    . TONSILLECTOMY     as  a child  . TOTAL HIP ARTHROPLASTY     right and left    Family History  Problem Relation Age of Onset  . Heart disease Father 23  . Diabetes Other        aunt   . Stroke Mother 11    Social History   Socioeconomic History  . Marital status: Married    Spouse name: Joann Maddox  . Number of children: 2  . Years of education: Not on file  . Highest education level: Not on file  Occupational History  . Occupation: Retired Pharmacist, hospital    Comment: Engineering geologist  Tobacco Use  . Smoking status: Never Smoker  . Smokeless tobacco: Never Used  Substance and Sexual Activity  . Alcohol use: No  . Drug use: Not on file  . Sexual activity: Not on file  Other Topics Concern  . Not on file  Social History Narrative   No regular exercise. 2 caffeine drinks per day.    Social Determinants of Health   Financial Resource Strain: Not on file  Food Insecurity: Not on file  Transportation Needs: Not on file  Physical Activity: Not on file  Stress: Not  on file  Social Connections: Not on file  Intimate Partner Violence: Not on file    Outpatient Medications Prior to Visit  Medication Sig Dispense Refill  . AMBULATORY NON FORMULARY MEDICATION Rolator Walker use as needed for ambulation DX: R29.6 1 each 0  . azelaic acid (AZELEX) 20 % cream APPLY TOPICALL 2 TIMES A DAY (MORNING AND EVENING). APPLY AFTER SKIN IS WASHED AND PATTED DRY. 30 g 6  . blood glucose meter kit and supplies KIT Dispense based on patient and insurance preference. Use up to once time daily as directed. (FOR ICD-9 250.00, 250.01). 1 each PRN  . bumetanide (BUMEX) 1 MG tablet TAKE 1 TABLET (1 MG TOTAL) BY MOUTH IN THE MORNING. 90 tablet 2  . diclofenac sodium (VOLTAREN) 1 % GEL Apply 2 g topically 4 (four) times daily. To affected joint. 100 g 11  . gabapentin (NEURONTIN) 300 MG capsule TAKE 1 CAPSULE (300 MG TOTAL) BY MOUTH AT BEDTIME. ONE  TAB BY MOUTH DAILY AT BEDTIME FOR A WEEK, THEN TWO TIMES DAILY FOR A WEEK, THEN THREE TIMES DAILY. MAY DOUBLE WEEKLY TO A MAX OF 3,600MG/DAY 90 capsule 3  . hydroxychloroquine (PLAQUENIL) 200 MG tablet TALE 2 TABLETS BY MOUTH WITH FOOD OR MILK ONCE A DAY 60 tablet 6  . levothyroxine (SYNTHROID) 25 MCG tablet TAKE 1 TABLET BY MOUTH EVERY DAY 90 tablet 0  . losartan (COZAAR) 50 MG tablet TAKE 1 TABLET BY MOUTH EVERY DAY 90 tablet 1  . meloxicam (MOBIC) 15 MG tablet TAKE ONE TABLET BY MOUTH EACH AM WITH BREAKFAST AS NEEDED FOR PAIN 30 tablet 2  . metoprolol succinate (TOPROL-XL) 25 MG 24 hr tablet Take 1 tablet (25 mg total) by mouth daily. 90 tablet 1  . Multiple Vitamin (MULTIVITAMIN) tablet Take 1 tablet by mouth daily.    Marland Kitchen MYRBETRIQ 25 MG TB24 tablet TAKE 1 TABLET BY MOUTH EVERYDAY AT BEDTIME 30 tablet 5  . nystatin (MYCOSTATIN/NYSTOP) powder Apply 1 gram to affected area 2 times daily. 60 g 11  . omeprazole (PRILOSEC) 40 MG capsule TAKE 1 CAPSULE (40 MG TOTAL) BY MOUTH IN THE MORNING. 90 capsule 1  . potassium chloride (KLOR-CON) 10 MEQ tablet TAKE 1 TABLET (10 MEQ TOTAL) BY MOUTH 2 (TWO) TIMES DAILY. 60 tablet 11  . rosuvastatin (CRESTOR) 10 MG tablet TAKE 1 TABLET BY MOUTH EVERYDAY AT BEDTIME 90 tablet 4  . senna (SENOKOT) 8.6 MG tablet Take 1 tablet by mouth at bedtime.     . traMADol (ULTRAM) 50 MG tablet Take 1-2 tablets (50-100 mg total) by mouth 3 (three) times daily. 180 tablet 0  . TURMERIC PO Take 1,000 mg by mouth daily.    . vitamin B-12 (CYANOCOBALAMIN) 100 MCG tablet Take 100 mcg by mouth at bedtime.     . Vitamin D, Ergocalciferol, (DRISDOL) 1.25 MG (50000 UNIT) CAPS capsule TAKE 1 CAPSULE (50,000 UNITS TOTAL) BY MOUTH EVERY SUNDAY. 4 capsule 8  . amLODipine (NORVASC) 5 MG tablet Take 5 mg by mouth daily.    Marland Kitchen FLUoxetine (PROZAC) 20 MG tablet TAKE 1 TABLET BY MOUTH EVERY DAY 30 tablet 5  . folic acid (FOLVITE) 1 MG tablet TAKE 1 TABLET BY MOUTH EVERY DAY 90 tablet 3  .  cephALEXin (KEFLEX) 500 MG capsule Take 1 capsule (500 mg total) by mouth 3 (three) times daily. 6 capsule 0   No facility-administered medications prior to visit.    Allergies  Allergen Reactions  . Iodine Other (See Comments)  Shook violently and passed out.  . Codeine Other (See Comments)    Hallucinations  . Lipitor [Atorvastatin] Other (See Comments)    Myalgias    ROS Review of Systems    Objective:    Physical Exam Vitals reviewed.  Constitutional:      Appearance: She is well-developed and well-nourished.  HENT:     Head: Normocephalic and atraumatic.  Eyes:     Extraocular Movements: EOM normal.     Conjunctiva/sclera: Conjunctivae normal.  Cardiovascular:     Rate and Rhythm: Normal rate.  Pulmonary:     Effort: Pulmonary effort is normal.  Musculoskeletal:     Comments: 1-2+ edema in both lower legs to her knees bilaterally.   Skin:    General: Skin is dry.     Coloration: Skin is not pale.  Neurological:     Mental Status: She is alert and oriented to person, place, and time.  Psychiatric:        Mood and Affect: Mood and affect normal.        Behavior: Behavior normal.     BP (!) 106/43   Pulse (!) 123   Ht 5' 2"  (1.575 m)   Wt 169 lb (76.7 kg)   SpO2 95%   BMI 30.91 kg/m  Wt Readings from Last 3 Encounters:  06/19/20 169 lb (76.7 kg)  05/08/20 176 lb (79.8 kg)  04/16/20 178 lb (80.7 kg)     Health Maintenance Due  Topic Date Due  . Hepatitis C Screening  Never done  . OPHTHALMOLOGY EXAM  Never done  . FOOT EXAM  04/12/2020    There are no preventive care reminders to display for this patient.  Lab Results  Component Value Date   TSH 0.34 (L) 03/27/2020   Lab Results  Component Value Date   WBC 5.2 08/21/2019   HGB 11.9 08/21/2019   HCT 35.9 08/21/2019   MCV 90.0 08/21/2019   PLT 204 08/21/2019   Lab Results  Component Value Date   NA 139 03/27/2020   K 4.8 03/27/2020   CO2 24 03/27/2020   GLUCOSE 84 03/27/2020    BUN 13 03/27/2020   CREATININE 1.16 (H) 03/27/2020   BILITOT 0.7 08/21/2019   ALKPHOS 66 12/23/2017   AST 20 08/21/2019   ALT 14 08/21/2019   PROT 6.3 08/21/2019   ALBUMIN 3.7 12/23/2017   CALCIUM 9.4 03/27/2020   ANIONGAP 10 01/04/2018   Lab Results  Component Value Date   CHOL 141 08/21/2019   Lab Results  Component Value Date   HDL 80 08/21/2019   Lab Results  Component Value Date   LDLCALC 47 08/21/2019   Lab Results  Component Value Date   TRIG 56 08/21/2019   Lab Results  Component Value Date   CHOLHDL 1.8 08/21/2019   Lab Results  Component Value Date   HGBA1C 5.3 02/05/2020      Assessment & Plan:   Problem List Items Addressed This Visit      Other   GAD (generalized anxiety disorder)    Doing well on the fluoxetine.  Refills sent to the pharmacy today.      Relevant Medications   FLUoxetine (PROZAC) 20 MG tablet    Other Visit Diagnoses    Cloudy urine    -  Primary   Relevant Orders   POCT URINALYSIS DIP (CLINITEK) (Completed)   Urine Culture   Urinary frequency       Relevant Orders   POCT  URINALYSIS DIP (CLINITEK) (Completed)   Urine Culture   Hypotension, unspecified hypotension type       Worsening headaches       Relevant Medications   FLUoxetine (PROZAC) 20 MG tablet      Hypotension-we will discontinue amlodipine 5 mg completely.  I am worried her blood pressure might even be lower at home than it is here today.  We will monitor that over the next couple weeks I do have a home blood pressure cuff.  Cloudy urine/urinary frequency.-urinalysis positive for leukocytes blood and protein.  Though she just had lithotripsy so we will send it for culture for further evaluation.  Headaches, unilateral -  certainly could be originating from her neck which is what it sounds like since applying the lidocaine and Voltaren seems to be helpful encourage him to continue to do that also encouraged her to work on some gentle stretches with her neck.   Offered to provide a handout if she would like.  LE edema worsened - increase bumex to whole tab QD x 3 days.    Meds ordered this encounter  Medications  . FLUoxetine (PROZAC) 20 MG tablet    Sig: Take 1 tablet (20 mg total) by mouth daily.    Dispense:  90 tablet    Refill:  3    Follow-up: No follow-ups on file.    Beatrice Lecher, MD

## 2020-06-19 NOTE — Patient Instructions (Addendum)
Please increase your Bumex to a whole tab daily for 3 days in a row.  We will see if we can get some the fluid off in your feet and ankles.  Continue to monitor your weights daily. [name] stop the amlodipine for now your blood pressure has been running a little low.  We can see how you do without it.

## 2020-06-19 NOTE — Assessment & Plan Note (Signed)
Doing well on the fluoxetine.  Refills sent to the pharmacy today.

## 2020-06-20 ENCOUNTER — Encounter: Payer: Self-pay | Admitting: Family Medicine

## 2020-06-21 LAB — URINE CULTURE
MICRO NUMBER:: 11573771
Result:: NO GROWTH
SPECIMEN QUALITY:: ADEQUATE

## 2020-06-27 ENCOUNTER — Other Ambulatory Visit: Payer: Self-pay | Admitting: Family Medicine

## 2020-06-27 DIAGNOSIS — E038 Other specified hypothyroidism: Secondary | ICD-10-CM

## 2020-07-02 ENCOUNTER — Telehealth: Payer: Self-pay | Admitting: Family Medicine

## 2020-07-02 ENCOUNTER — Encounter: Payer: Self-pay | Admitting: Family Medicine

## 2020-07-02 ENCOUNTER — Other Ambulatory Visit: Payer: Self-pay

## 2020-07-02 ENCOUNTER — Ambulatory Visit (INDEPENDENT_AMBULATORY_CARE_PROVIDER_SITE_OTHER): Payer: Medicare Other | Admitting: Family Medicine

## 2020-07-02 VITALS — BP 107/53 | HR 52 | Ht 62.0 in | Wt 163.0 lb

## 2020-07-02 DIAGNOSIS — M19012 Primary osteoarthritis, left shoulder: Secondary | ICD-10-CM

## 2020-07-02 DIAGNOSIS — L659 Nonscarring hair loss, unspecified: Secondary | ICD-10-CM | POA: Diagnosis not present

## 2020-07-02 DIAGNOSIS — I10 Essential (primary) hypertension: Secondary | ICD-10-CM

## 2020-07-02 DIAGNOSIS — E038 Other specified hypothyroidism: Secondary | ICD-10-CM

## 2020-07-02 DIAGNOSIS — E118 Type 2 diabetes mellitus with unspecified complications: Secondary | ICD-10-CM | POA: Diagnosis not present

## 2020-07-02 DIAGNOSIS — N3946 Mixed incontinence: Secondary | ICD-10-CM

## 2020-07-02 LAB — POCT GLYCOSYLATED HEMOGLOBIN (HGB A1C): Hemoglobin A1C: 5.1 % (ref 4.0–5.6)

## 2020-07-02 MED ORDER — TRAMADOL HCL 50 MG PO TABS: 50.0000 mg | ORAL_TABLET | Freq: Two times a day (BID) | ORAL | 0 refills | Status: DC | PRN

## 2020-07-02 NOTE — Telephone Encounter (Signed)
Lease call patient and let her know that I want her to go ahead and start cutting her losartan in half.  If she is already doing so then just let me know.  I also want to have her hold her mail protract for about a week just to see if she notices a difference in the urination.  Because if it is really not helping her hold her urine then I want to discontinue it for now.

## 2020-07-02 NOTE — Assessment & Plan Note (Addendum)
Blood pressure looks great off of the amlodipine.  We will have her cut her losartan in half as well.

## 2020-07-02 NOTE — Assessment & Plan Note (Signed)
A1c looks phenomenal today.  Monitor again in 6 months.  Lab Results  Component Value Date   HGBA1C 5.1 07/02/2020

## 2020-07-02 NOTE — Progress Notes (Signed)
Established Patient Office Visit  Subjective:  Patient ID: Joann Maddox, female    DOB: Dec 24, 1940  Age: 80 y.o. MRN: [id number]  CC:  Chief Complaint  Patient presents with  . Follow-up    HPI Joann Maddox presents for   C/O hair loss as well.  She said she had noticed it a couple of months ago.  But the last time she saw her hairdresser she actually felt like there was a little less hair loss this time.  She feels like it is a little bit all over its not localized to one area.  No major medication changes if anything we have actually decreased her medications.  She feels like overall the swelling in her legs has been fairly stable she did have a few days at the end of last week where she started to swell again but then it seemed to come back down in the last couple days it looked good again.  She has been taking her medications regularly.  Follow up with Urology next month.  They are planning on trying to go into remove the stone and if they are able to do so then they will remove the stent.  If not then they will leave the stent in.  Pain she is mostly relying on Tylenol and as needed tramadol she does need a refill on the tramadol.  We had discontinued her amlodipine at last office visit she has been doing well without it.  She continues to work on weight loss and would like to get down to about 140 pounds.  Past Medical History:  Diagnosis Date  . Anxiety   . Arthritis   . Depression   . Headache(784.0)    migrains  . Heart murmur   . Hypothyroidism   . Pre-diabetes   . Rheumatoid arthritis(714.0)     Past Surgical History:  Procedure Laterality Date  . ABDOMINAL HYSTERECTOMY    . ANTERIOR CERVICAL DECOMP/DISCECTOMY FUSION N/A 01/03/2018   Procedure: ANTERIOR CERVICAL DECOMPRESSION/DISCECTOMY FUSION, ALLOGRAFT, PLATE;  Surgeon: Marybelle Killings, MD;  Location: [facility];  Service: Orthopedics;  Laterality: N/A;  . APPENDECTOMY    . CHOLECYSTECTOMY    . REPLACEMENT TOTAL  KNEE BILATERAL    . TONSILLECTOMY     as  a child  . TOTAL HIP ARTHROPLASTY     right and left    Family History  Problem Relation Age of Onset  . Heart disease Father 59  . Diabetes Other        aunt   . Stroke Mother 58    Social History   Socioeconomic History  . Marital status: Married    Spouse name: Sonia Side  . Number of children: 2  . Years of education: Not on file  . Highest education level: Not on file  Occupational History  . Occupation: Retired Pharmacist, hospital    Comment: Engineering geologist  Tobacco Use  . Smoking status: Never Smoker  . Smokeless tobacco: Never Used  Substance and Sexual Activity  . Alcohol use: No  . Drug use: Not on file  . Sexual activity: Not on file  Other Topics Concern  . Not on file  Social History Narrative   No regular exercise. 2 caffeine drinks per day.    Social Determinants of Health   Financial Resource Strain: Not on file  Food Insecurity: Not on file  Transportation Needs: Not on file  Physical Activity: Not on file  Stress: Not on file  Social Connections: Not on file  Intimate Partner Violence: Not on file    Outpatient Medications Prior to Visit  Medication Sig Dispense Refill  . azelaic acid (AZELEX) 20 % cream APPLY TOPICALL 2 TIMES A DAY (MORNING AND EVENING). APPLY AFTER SKIN IS WASHED AND PATTED DRY. 30 g 6  . blood glucose meter kit and supplies KIT Dispense based on patient and insurance preference. Use up to once time daily as directed. (FOR ICD-9 250.00, 250.01). 1 each PRN  . bumetanide (BUMEX) 1 MG tablet TAKE 1 TABLET (1 MG TOTAL) BY MOUTH IN THE MORNING. 90 tablet 2  . diclofenac sodium (VOLTAREN) 1 % GEL Apply 2 g topically 4 (four) times daily. To affected joint. 100 g 11  . FLUoxetine (PROZAC) 20 MG tablet Take 1 tablet (20 mg total) by mouth daily. 90 tablet 3  . gabapentin (NEURONTIN) 300 MG capsule TAKE 1 CAPSULE (300 MG TOTAL) BY MOUTH AT BEDTIME. ONE TAB BY MOUTH DAILY AT BEDTIME FOR A WEEK, THEN TWO  TIMES DAILY FOR A WEEK, THEN THREE TIMES DAILY. MAY DOUBLE WEEKLY TO A MAX OF 3,600MG/DAY 90 capsule 3  . hydroxychloroquine (PLAQUENIL) 200 MG tablet TALE 2 TABLETS BY MOUTH WITH FOOD OR MILK ONCE A DAY 60 tablet 6  . levothyroxine (SYNTHROID) 25 MCG tablet TAKE 1 TABLET BY MOUTH EVERY DAY 90 tablet 1  . losartan (COZAAR) 50 MG tablet TAKE 1 TABLET BY MOUTH EVERY DAY 90 tablet 1  . metoprolol succinate (TOPROL-XL) 25 MG 24 hr tablet Take 1 tablet (25 mg total) by mouth daily. 90 tablet 1  . Multiple Vitamin (MULTIVITAMIN) tablet Take 1 tablet by mouth daily.    Marland Kitchen MYRBETRIQ 25 MG TB24 tablet TAKE 1 TABLET BY MOUTH EVERYDAY AT BEDTIME 30 tablet 5  . nystatin (MYCOSTATIN/NYSTOP) powder Apply 1 gram to affected area 2 times daily. 60 g 11  . potassium chloride (KLOR-CON) 10 MEQ tablet TAKE 1 TABLET (10 MEQ TOTAL) BY MOUTH 2 (TWO) TIMES DAILY. 60 tablet 11  . rosuvastatin (CRESTOR) 10 MG tablet TAKE 1 TABLET BY MOUTH EVERYDAY AT BEDTIME 90 tablet 4  . senna (SENOKOT) 8.6 MG tablet Take 1 tablet by mouth at bedtime.     . TURMERIC PO Take 1,000 mg by mouth daily.    . vitamin B-12 (CYANOCOBALAMIN) 100 MCG tablet Take 100 mcg by mouth at bedtime.     . Vitamin D, Ergocalciferol, (DRISDOL) 1.25 MG (50000 UNIT) CAPS capsule TAKE 1 CAPSULE (50,000 UNITS TOTAL) BY MOUTH EVERY SUNDAY. 4 capsule 8  . AMBULATORY NON FORMULARY MEDICATION Rolator Walker use as needed for ambulation DX: R29.6 1 each 0  . meloxicam (MOBIC) 15 MG tablet TAKE ONE TABLET BY MOUTH EACH AM WITH BREAKFAST AS NEEDED FOR PAIN 30 tablet 2  . omeprazole (PRILOSEC) 40 MG capsule TAKE 1 CAPSULE (40 MG TOTAL) BY MOUTH IN THE MORNING. 90 capsule 1  . traMADol (ULTRAM) 50 MG tablet Take 1-2 tablets (50-100 mg total) by mouth 3 (three) times daily. 180 tablet 0   No facility-administered medications prior to visit.    Allergies  Allergen Reactions  . Iodine Other (See Comments)    Shook violently and passed out.  . Codeine Other (See  Comments)    Hallucinations  . Lipitor [Atorvastatin] Other (See Comments)    Myalgias    ROS Review of Systems    Objective:    Physical Exam Constitutional:      Appearance: She is well-developed and well-nourished.  HENT:     Head: Normocephalic and atraumatic.  Cardiovascular:     Rate and Rhythm: Normal rate and regular rhythm.     Heart sounds: Normal heart sounds.  Pulmonary:     Effort: Pulmonary effort is normal.     Breath sounds: Normal breath sounds.  Skin:    General: Skin is warm and dry.  Neurological:     Mental Status: She is alert and oriented to person, place, and time.  Psychiatric:        Mood and Affect: Mood and affect normal.        Behavior: Behavior normal.     BP (!) 107/53   Pulse (!) 52   Ht _0  (1.575 m)   Wt 163 lb (73.9 kg)   SpO2 100%   BMI 29.81 kg/m  Wt Readings from Last 3 Encounters:  07/02/20 163 lb (73.9 kg)  06/19/20 169 lb (76.7 kg)  05/08/20 176 lb (79.8 kg)     Health Maintenance Due  Topic Date Due  . Hepatitis C Screening  Never done  . OPHTHALMOLOGY EXAM  Never done  . FOOT EXAM  04/12/2020    There are no preventive care reminders to display for this patient.  Lab Results  Component Value Date   TSH 0.34 (L) 03/27/2020   Lab Results  Component Value Date   WBC 5.2 08/21/2019   HGB 11.9 08/21/2019   HCT 35.9 08/21/2019   MCV 90.0 08/21/2019   PLT 204 08/21/2019   Lab Results  Component Value Date   NA 139 03/27/2020   K 4.8 03/27/2020   CO2 24 03/27/2020   GLUCOSE 84 03/27/2020   BUN 13 03/27/2020   CREATININE 1.16 (H) 03/27/2020   BILITOT 0.7 08/21/2019   ALKPHOS 66 12/23/2017   AST 20 08/21/2019   ALT 14 08/21/2019   PROT 6.3 08/21/2019   ALBUMIN 3.7 12/23/2017   CALCIUM 9.4 03/27/2020   ANIONGAP 10 01/04/2018   Lab Results  Component Value Date   CHOL 141 08/21/2019   Lab Results  Component Value Date   HDL 80 08/21/2019   Lab Results  Component Value Date   LDLCALC 47  08/21/2019   Lab Results  Component Value Date   TRIG 56 08/21/2019   Lab Results  Component Value Date   CHOLHDL 1.8 08/21/2019   Lab Results  Component Value Date   HGBA1C 5.1 07/02/2020      Assessment & Plan:   Problem List Items Addressed This Visit      Cardiovascular and Mediastinum   Essential hypertension, benign    Blood pressure looks great off of the amlodipine.  We will have her cut her losartan in half as well.        Endocrine   Hypothyroidism   Relevant Orders   TSH   Ferritin   B12   Controlled diabetes mellitus type 2 with complications (HCC)    J5T looks phenomenal today.  Monitor again in 6 months.  Lab Results  Component Value Date   HGBA1C 5.1 07/02/2020         Relevant Orders   TSH   Ferritin   B12   POCT glycosylated hemoglobin (Hb A1C) (Completed)     Musculoskeletal and Integument   Primary osteoarthritis of left shoulder   Relevant Medications   traMADol (ULTRAM) 50 MG tablet     Other   Mixed stress and urge urinary incontinence    Currently seeing a urologist  for the stone but she would like to see a female for her incontinence issues.  We will go ahead and refer her to Dr. [place] Pink at [facility].  Already on [facility] but it does not sound like it is really helping some, have her hold it may be for a week just to see if it really is helpful or not because of its not been working to discontinue it completely.  I suspect that she may have some repeat bladder prolapse it sounds like she had surgical correction years ago and I am wondering if she may have a repeat prolapse situation      Relevant Orders   Ambulatory referral to Urology    Other Visit Diagnoses    Hair loss    -  Primary   Relevant Orders   TSH   Ferritin   B12     Hair loss-we discussed that it already sounds like she is getting less hair loss overall which is good.  It sounds like it is probably stabilizing.  We will do some additional labs  including rechecking her thyroid which was off last time we looked at it.  Gust that sometimes hair loss will happen when somebody has been sick or acutely ill or undergone some trauma etc.  We will continue to monitor.   Meds ordered this encounter  Medications  . traMADol (ULTRAM) 50 MG tablet    Sig: Take 1-2 tablets (50-100 mg total) by mouth 2 (two) times daily as needed.    Dispense:  90 tablet    Refill:  0    Not to exceed 5 additional fills before 01/25/2020 DX Code Needed  .    Follow-up: Return in about 3 months (around 10/02/2020).    Beatrice Lecher, MD

## 2020-07-02 NOTE — Assessment & Plan Note (Addendum)
Currently seeing a urologist for the stone but she would like to see a female for her incontinence issues.  We will go ahead and refer her to Dr. Lavella Hammock at [facility]  Already on [facility] but it does not sound like it is really helping some, have her hold it may be for a week just to see if it really is helpful or not because of its not been working to discontinue it completely.  I suspect that she may have some repeat bladder prolapse it sounds like she had surgical correction years ago and I am wondering if she may have a repeat prolapse situation

## 2020-07-02 NOTE — Telephone Encounter (Signed)
Spoke w/pt's husband and advised that Dr. Linford Arnold wants Joann Maddox to d/c Metoprolol for 2 wks and to monitor her bp.  (Pt already is taking 50 mg of losartan)  He voiced understanding and agreed.

## 2020-07-03 ENCOUNTER — Other Ambulatory Visit: Payer: Self-pay | Admitting: Family Medicine

## 2020-07-03 LAB — TSH: TSH: 0.29 mIU/L — ABNORMAL LOW (ref 0.40–4.50)

## 2020-07-03 LAB — FERRITIN: Ferritin: 86 ng/mL (ref 16–288)

## 2020-07-03 LAB — VITAMIN B12: Vitamin B-12: 276 pg/mL (ref 200–1100)

## 2020-07-04 ENCOUNTER — Other Ambulatory Visit: Payer: Self-pay | Admitting: *Deleted

## 2020-07-04 DIAGNOSIS — E038 Other specified hypothyroidism: Secondary | ICD-10-CM

## 2020-07-15 ENCOUNTER — Other Ambulatory Visit: Payer: Self-pay | Admitting: Family Medicine

## 2020-07-16 ENCOUNTER — Other Ambulatory Visit: Payer: Self-pay | Admitting: Family Medicine

## 2020-08-05 ENCOUNTER — Encounter: Payer: Self-pay | Admitting: Family Medicine

## 2020-08-06 ENCOUNTER — Other Ambulatory Visit: Payer: Self-pay | Admitting: Family Medicine

## 2020-08-13 NOTE — Telephone Encounter (Signed)
Please check on referral to [facility] as placed on March 8.  And notify patient of update.  She is currently seeing urology for the kidney stones but wants to see a female for her bladder problems which are separate.

## 2020-08-14 NOTE — Telephone Encounter (Signed)
I called [facility] and as always they stated they did not receive the referral. I went ahead and got Joann Maddox scheduled for 12/13/20 at 2:00 with a female provider. This was their first available appointment. They are sending her the information in the mail and I am re faxing all the information. - CF

## 2020-08-14 NOTE — Telephone Encounter (Signed)
Thank you, please update pt

## 2020-08-19 ENCOUNTER — Telehealth: Payer: Self-pay | Admitting: Family Medicine

## 2020-08-19 NOTE — Telephone Encounter (Signed)
Joann Maddox, could you call her and see what is going on.  I am more than happy to see her if she is having some acute issues but as far as her long-term issues I have already referred her to Dr. Lavella Hammock.  Unfortunately she is just booked out till August but she could certainly call them and see if she can get on a cancellation list.  She also currently has a urologist that she is working with for I believe kidney stones if I am not mistaken.  But again if this is a more long-term issue then I have already placed a referral but again I am more than happy to address any short-term issues if she thinks she has a UTI, etc.

## 2020-08-19 NOTE — Telephone Encounter (Signed)
Patient calling in wanting to follow up with PCP. About the issues with her bladder. Nothing open until 5/12 and patient is a 40 minute patient. Only acute open. Please advise.

## 2020-08-20 MED ORDER — MIRABEGRON ER 50 MG PO TB24: 50.0000 mg | ORAL_TABLET | Freq: Every day | ORAL | 3 refills | Status: DC

## 2020-08-20 NOTE — Telephone Encounter (Signed)
Spoke w/pt and she is looking for some help with the bladder issues.  Pt advised to call Dr. Ashley Royalty office to see if she can get on their cancellation list to be seen earlier or call her urologist to seek help.  Pt inquired about getting the Myrbetriq 25 mg increased.   I told her that I would fwd this to Dr. Linford Arnold for advice.

## 2020-08-20 NOTE — Telephone Encounter (Signed)
Ok we can try to go up to 50mg  on the myrbetriq. New rx sent.

## 2020-08-21 NOTE — Telephone Encounter (Signed)
Pt advised. She has picked this up and will start this today.   Pt advised to reach back out to urology to see if she can get on a cancellation list. She said that she would.

## 2020-09-03 ENCOUNTER — Other Ambulatory Visit: Payer: Self-pay | Admitting: Family Medicine

## 2020-09-09 ENCOUNTER — Other Ambulatory Visit: Payer: Self-pay | Admitting: Family Medicine

## 2020-09-09 ENCOUNTER — Encounter: Payer: Self-pay | Admitting: Family Medicine

## 2020-09-09 NOTE — Telephone Encounter (Signed)
I think she is just due for a TSH and vitamin B12, it looks like she just had a BMP done at [facility] so we do not need to repeat that it looks like they also did an A1c.  On the med list that he sent it looks like she is taking her thyroid pill she is supposed to be holding it as the phone note below from her recent results   "have you completely stop your thyroid medication at this point and then plan to recheck your thyroid again in 8 weeks. You are only taking a very small dose 25 mcg daily which is the smallest strength that it comes in and it looks like it still a little bit too much. In fact if you are overmedicated it can actually cause some hair loss so that might be part of the problem. Your iron stores actually look okay. Your B12 is technically normal but it is definitely on the low end so make sure you are continuing to take your B12 supplement regularly. You might even want to double up and increase to 2 tabs daily.  I would say after she gets her labs done we can schedule an appointment to go through the medications I usually reserve an entire appointment to do a medication review to see if there is anything that we can get rid of or just simplify.

## 2020-09-12 NOTE — Telephone Encounter (Signed)
Left message advising of recommendations and a return call.  

## 2020-09-13 ENCOUNTER — Other Ambulatory Visit: Payer: Self-pay | Admitting: *Deleted

## 2020-09-13 DIAGNOSIS — D509 Iron deficiency anemia, unspecified: Secondary | ICD-10-CM

## 2020-09-13 DIAGNOSIS — E118 Type 2 diabetes mellitus with unspecified complications: Secondary | ICD-10-CM

## 2020-09-13 DIAGNOSIS — E559 Vitamin D deficiency, unspecified: Secondary | ICD-10-CM

## 2020-09-13 DIAGNOSIS — E038 Other specified hypothyroidism: Secondary | ICD-10-CM

## 2020-09-19 ENCOUNTER — Encounter: Payer: Self-pay | Admitting: Family Medicine

## 2020-09-19 LAB — TSH: TSH: 0.44 mIU/L (ref 0.40–4.50)

## 2020-09-19 LAB — VITAMIN B12: Vitamin B-12: 664 pg/mL (ref 200–1100)

## 2020-09-19 LAB — VITAMIN D 25 HYDROXY (VIT D DEFICIENCY, FRACTURES): Vit D, 25-Hydroxy: 120 ng/mL — ABNORMAL HIGH (ref 30–100)

## 2020-09-24 ENCOUNTER — Encounter: Payer: Self-pay | Admitting: Family Medicine

## 2020-09-24 ENCOUNTER — Ambulatory Visit (INDEPENDENT_AMBULATORY_CARE_PROVIDER_SITE_OTHER): Payer: Medicare Other | Admitting: Family Medicine

## 2020-09-24 ENCOUNTER — Other Ambulatory Visit: Payer: Self-pay

## 2020-09-24 VITALS — BP 109/51 | HR 60 | Ht 62.0 in | Wt 154.0 lb

## 2020-09-24 DIAGNOSIS — R42 Dizziness and giddiness: Secondary | ICD-10-CM | POA: Diagnosis not present

## 2020-09-24 DIAGNOSIS — I890 Lymphedema, not elsewhere classified: Secondary | ICD-10-CM

## 2020-09-24 DIAGNOSIS — R600 Localized edema: Secondary | ICD-10-CM

## 2020-09-24 DIAGNOSIS — E0590 Thyrotoxicosis, unspecified without thyrotoxic crisis or storm: Secondary | ICD-10-CM

## 2020-09-24 DIAGNOSIS — E785 Hyperlipidemia, unspecified: Secondary | ICD-10-CM

## 2020-09-24 DIAGNOSIS — N3946 Mixed incontinence: Secondary | ICD-10-CM

## 2020-09-24 DIAGNOSIS — I10 Essential (primary) hypertension: Secondary | ICD-10-CM

## 2020-09-24 MED ORDER — BUMETANIDE 0.5 MG PO TABS: 0.5000 mg | ORAL_TABLET | Freq: Every morning | ORAL | 0 refills | Status: DC

## 2020-09-24 MED ORDER — VITAMIN D (ERGOCALCIFEROL) 1.25 MG (50000 UNIT) PO CAPS: 50000.0000 [IU] | ORAL_CAPSULE | ORAL | 0 refills | Status: DC

## 2020-09-24 MED ORDER — GABAPENTIN 300 MG PO CAPS: ORAL_CAPSULE | ORAL | 3 refills | Status: DC

## 2020-09-24 MED ORDER — GABAPENTIN 300 MG PO CAPS: 300.0000 mg | ORAL_CAPSULE | Freq: Three times a day (TID) | ORAL | 0 refills | Status: DC

## 2020-09-24 MED ORDER — AZELEX 20 % EX CREA: TOPICAL_CREAM | CUTANEOUS | 6 refills | Status: DC

## 2020-09-24 NOTE — Assessment & Plan Note (Signed)
Due to recheck lipid she is currently on 10 mg of Crestor but had again has lost a lot of weight.  She is no longer diabetic.

## 2020-09-24 NOTE — Assessment & Plan Note (Signed)
Continue to monitor.  Plan to recheck again in 6 months.

## 2020-09-24 NOTE — Progress Notes (Signed)
Established Patient Office Visit  Subjective:  Patient ID: Joann Maddox, female    DOB: 1940-11-10  Age: 81 y.o. MRN: [id number]  CC:  Chief Complaint  Patient presents with  . Follow-up    Pt reports that she is going to be leaving for [place] on 6/13    HPI Joann Maddox presents for dizziness with with positions change.  She is overall doing well although she has lost even more weight since I last saw her. She has lost 9 more lbs.    Hypertension- Pt denies chest pain, SOB, dizziness, or heart palpitations.  Taking meds as directed w/o problems.  Denies medication side effects.    Still has diabetes on her chart and wonders if she still has diabetes or not she is not been on prescription medication in quite some time.  She was originally diagnosed back in 2016.  MI was previously 47 now down to 63.  She really wants to get off of some her medications including her cholesterol medication I believe it was originally started because of her diabetes.  But again she has had a normal set A1c for the last couple of years completely off of medication.  Recent vitamin D blood work checked was elevated.  So she has stopped her vitamin D until she came in today.  Past Medical History:  Diagnosis Date  . Anxiety   . Arthritis   . Depression   . Headache(784.0)    migrains  . Heart murmur   . Hypothyroidism   . Pre-diabetes   . Rheumatoid arthritis(714.0)     Past Surgical History:  Procedure Laterality Date  . ABDOMINAL HYSTERECTOMY    . ANTERIOR CERVICAL DECOMP/DISCECTOMY FUSION N/A 01/03/2018   Procedure: ANTERIOR CERVICAL DECOMPRESSION/DISCECTOMY FUSION, ALLOGRAFT, PLATE;  Surgeon: Marybelle Killings, MD;  Location: [place] [facility];  Service: Orthopedics;  Laterality: N/A;  . APPENDECTOMY    . CHOLECYSTECTOMY    . REPLACEMENT TOTAL KNEE BILATERAL    . TONSILLECTOMY     as  a child  . TOTAL HIP ARTHROPLASTY     right and left    Family History  Problem Relation Age of Onset  . Heart  disease Father 73  . Diabetes Other        aunt   . Stroke Mother 69    Social History   Socioeconomic History  . Marital status: Married    Spouse name: Sonia Side  . Number of children: 2  . Years of education: Not on file  . Highest education level: Not on file  Occupational History  . Occupation: Retired Pharmacist, hospital    Comment: Engineering geologist  Tobacco Use  . Smoking status: Never Smoker  . Smokeless tobacco: Never Used  Substance and Sexual Activity  . Alcohol use: No  . Drug use: Not on file  . Sexual activity: Not on file  Other Topics Concern  . Not on file  Social History Narrative   No regular exercise. 2 caffeine drinks per day.    Social Determinants of Health   Financial Resource Strain: Not on file  Food Insecurity: Not on file  Transportation Needs: Not on file  Physical Activity: Not on file  Stress: Not on file  Social Connections: Not on file  Intimate Partner Violence: Not on file    Outpatient Medications Prior to Visit  Medication Sig Dispense Refill  . FLUoxetine (PROZAC) 20 MG tablet TAKE 1 TABLET BY MOUTH EVERY DAY 90 tablet 4  .  hydroxychloroquine (PLAQUENIL) 200 MG tablet TALE 2 TABLETS BY MOUTH WITH FOOD OR MILK ONCE A DAY 60 tablet 6  . losartan (COZAAR) 50 MG tablet TAKE 1 TABLET BY MOUTH EVERY DAY 90 tablet 1  . mirabegron ER (MYRBETRIQ) 50 MG TB24 tablet Take 1 tablet (50 mg total) by mouth daily. 30 tablet 3  . Multiple Vitamin (MULTIVITAMIN) tablet Take 1 tablet by mouth daily.    Marland Kitchen nystatin (MYCOSTATIN/NYSTOP) powder APPLY 1 GRAM TO AFFECTED AREA 2 TIMES DAILY. 60 g 11  . potassium chloride (KLOR-CON) 10 MEQ tablet TAKE 1 TABLET (10 MEQ TOTAL) BY MOUTH 2 (TWO) TIMES DAILY. 60 tablet 11  . rosuvastatin (CRESTOR) 10 MG tablet TAKE 1 TABLET BY MOUTH EVERYDAY AT BEDTIME 90 tablet 3  . traMADol (ULTRAM) 50 MG tablet Take 1-2 tablets (50-100 mg total) by mouth 2 (two) times daily as needed. 90 tablet 0  . azelaic acid (AZELEX) 20 % cream APPLY  TOPICALL 2 TIMES A DAY (MORNING AND EVENING). APPLY AFTER SKIN IS WASHED AND PATTED DRY. 30 g 6  . blood glucose meter kit and supplies KIT Dispense based on patient and insurance preference. Use up to once time daily as directed. (FOR ICD-9 250.00, 250.01). 1 each PRN  . bumetanide (BUMEX) 1 MG tablet TAKE 1 TABLET (1 MG TOTAL) BY MOUTH IN THE MORNING. 90 tablet 2  . diclofenac sodium (VOLTAREN) 1 % GEL Apply 2 g topically 4 (four) times daily. To affected joint. 100 g 11  . gabapentin (NEURONTIN) 300 MG capsule TAKE 1 CAPSULE (300 MG TOTAL) BY MOUTH AT BEDTIME. ONE TAB BY MOUTH DAILY AT BEDTIME FOR A WEEK, THEN TWO TIMES DAILY FOR A WEEK, THEN THREE TIMES DAILY. MAY DOUBLE WEEKLY TO A MAX OF 3,$Remove'600MG'XwqKyuj$ /DAY 90 capsule 3  . metoprolol succinate (TOPROL-XL) 25 MG 24 hr tablet Take 1 tablet (25 mg total) by mouth daily. 90 tablet 1  . senna (SENOKOT) 8.6 MG tablet Take 1 tablet by mouth at bedtime.     . TURMERIC PO Take 1,000 mg by mouth daily.    . vitamin B-12 (CYANOCOBALAMIN) 100 MCG tablet Take 100 mcg by mouth at bedtime.     . Vitamin D, Ergocalciferol, (DRISDOL) 1.25 MG (50000 UNIT) CAPS capsule TAKE 1 CAPSULE (50,000 UNITS TOTAL) BY MOUTH EVERY SUNDAY. 12 capsule 2   No facility-administered medications prior to visit.    Allergies  Allergen Reactions  . Iodine Other (See Comments)    Shook violently and passed out.  . Codeine Other (See Comments)    Hallucinations  . Lipitor [Atorvastatin] Other (See Comments)    Myalgias    ROS Review of Systems    Objective:    Physical Exam Constitutional:      Appearance: She is well-developed.  HENT:     Head: Normocephalic and atraumatic.  Cardiovascular:     Rate and Rhythm: Normal rate and regular rhythm.     Heart sounds: Normal heart sounds.  Pulmonary:     Effort: Pulmonary effort is normal.     Breath sounds: Normal breath sounds.  Skin:    General: Skin is warm and dry.  Neurological:     Mental Status: She is alert and  oriented to person, place, and time.  Psychiatric:        Behavior: Behavior normal.     BP (!) 109/51   Pulse 60   Ht $R'5\' 2"'Zf$  (1.575 m)   Wt 154 lb (69.9 kg)   SpO2 100%  BMI 28.17 kg/m  Wt Readings from Last 3 Encounters:  09/24/20 154 lb (69.9 kg)  07/02/20 163 lb (73.9 kg)  06/19/20 169 lb (76.7 kg)     Health Maintenance Due  Topic Date Due  . OPHTHALMOLOGY EXAM  Never done  . Hepatitis C Screening  Never done  . Zoster Vaccines- Shingrix (1 of 2) Never done    There are no preventive care reminders to display for this patient.  Lab Results  Component Value Date   TSH 0.44 09/18/2020   Lab Results  Component Value Date   WBC 5.2 08/21/2019   HGB 11.9 08/21/2019   HCT 35.9 08/21/2019   MCV 90.0 08/21/2019   PLT 204 08/21/2019   Lab Results  Component Value Date   NA 139 03/27/2020   K 4.8 03/27/2020   CO2 24 03/27/2020   GLUCOSE 84 03/27/2020   BUN 13 03/27/2020   CREATININE 1.16 (H) 03/27/2020   BILITOT 0.7 08/21/2019   ALKPHOS 66 12/23/2017   AST 20 08/21/2019   ALT 14 08/21/2019   PROT 6.3 08/21/2019   ALBUMIN 3.7 12/23/2017   CALCIUM 9.4 03/27/2020   ANIONGAP 10 01/04/2018   Lab Results  Component Value Date   CHOL 141 08/21/2019   Lab Results  Component Value Date   HDL 80 08/21/2019   Lab Results  Component Value Date   LDLCALC 47 08/21/2019   Lab Results  Component Value Date   TRIG 56 08/21/2019   Lab Results  Component Value Date   CHOLHDL 1.8 08/21/2019   Lab Results  Component Value Date   HGBA1C 5.1 07/02/2020      Assessment & Plan:   Problem List Items Addressed This Visit      Cardiovascular and Mediastinum   Essential hypertension, benign    She has been having some postural dizziness which I suspect could be secondary to hypertension.  She is continues to lose weight so I think her need for blood pressure medication has been decreasing.  Will stop the metoprolol.  Will see if her migraines get worse off  the medication.       Relevant Medications   bumetanide (BUMEX) 0.5 MG tablet   Other Relevant Orders   COMPLETE METABOLIC PANEL WITH GFR   Lipid panel     Endocrine   Subclinical hyperthyroidism    Continue to monitor.  Plan to recheck again in 6 months.        Other   Mixed stress and urge urinary incontinence    Is willing to drive to [facility] so we will see if we can get her in with [facility].      Relevant Orders   Ambulatory referral to Urology   Hyperlipidemia    Due to recheck lipid she is currently on 10 mg of Crestor but had again has lost a lot of weight.  She is no longer diabetic.      Relevant Medications   bumetanide (BUMEX) 0.5 MG tablet   Other Relevant Orders   COMPLETE METABOLIC PANEL WITH GFR   Lipid panel    Other Visit Diagnoses    Postural dizziness    -  Primary   Lymphedema       Relevant Medications   bumetanide (BUMEX) 0.5 MG tablet   Lower extremity edema       Relevant Medications   bumetanide (BUMEX) 0.5 MG tablet      Meds ordered this encounter  Medications  . DISCONTD:  Vitamin D, Ergocalciferol, (DRISDOL) 1.25 MG (50000 UNIT) CAPS capsule    Sig: Take 1 capsule (50,000 Units total) by mouth every 30 (thirty) days.    Dispense:  1 capsule    Refill:  0  . DISCONTD: gabapentin (NEURONTIN) 300 MG capsule    Sig: TAKE 1 CAPSULE (300 MG TOTAL) BY MOUTH AT BEDTIME. ONE TAB BY MOUTH DAILY AT BEDTIME FOR A WEEK, THEN TWO TIMES DAILY FOR A WEEK, THEN THREE TIMES DAILY. MAY DOUBLE WEEKLY TO A MAX OF 3,600MG/DAY    Dispense:  90 capsule    Refill:  3  . bumetanide (BUMEX) 0.5 MG tablet    Sig: Take 1 tablet (0.5 mg total) by mouth in the morning.    Dispense:  90 tablet    Refill:  0    Pt will call when needed  . azelaic acid (AZELEX) 20 % cream    Sig: APPLY TOPICALL 2 TIMES A DAY (MORNING AND EVENING). APPLY AFTER SKIN IS WASHED AND PATTED DRY.    Dispense:  30 g    Refill:  6  . gabapentin (NEURONTIN) 300 MG capsule     Sig: Take 1 capsule (300 mg total) by mouth 3 (three) times daily.    Dispense:  1 capsule    Refill:  0    Follow-up: Return in about 3 months (around 12/25/2020) for BP.   I spent 42 minutes on the day of the encounter to include pre-visit record review, face-to-face time with the patient and post visit ordering of test.   Beatrice Lecher, MD

## 2020-09-24 NOTE — Assessment & Plan Note (Addendum)
She has been having some postural dizziness which I suspect could be secondary to hypertension.  She is continues to lose weight so I think her need for blood pressure medication has been decreasing.  Will stop the metoprolol.  Will see if her migraines get worse off the medication.

## 2020-09-24 NOTE — Telephone Encounter (Signed)
Patient seen in office

## 2020-09-24 NOTE — Patient Instructions (Signed)
Ok to decrease vitamin D to once a month.  Please get your labs when you are able to fast for about 8 hours.  Can have plenty of water and stay hydrated.

## 2020-09-24 NOTE — Assessment & Plan Note (Signed)
Is willing to drive to [facility] so we will see if we can get her in with [facility].

## 2020-09-25 LAB — COMPLETE METABOLIC PANEL WITH GFR
AG Ratio: 1.5 (calc) (ref 1.0–2.5)
ALT: 16 U/L (ref 6–29)
AST: 24 U/L (ref 10–35)
Albumin: 3.9 g/dL (ref 3.6–5.1)
Alkaline phosphatase (APISO): 83 U/L (ref 37–153)
BUN: 21 mg/dL (ref 7–25)
CO2: 27 mmol/L (ref 20–32)
Calcium: 9.6 mg/dL (ref 8.6–10.4)
Chloride: 107 mmol/L (ref 98–110)
Creat: 0.8 mg/dL (ref 0.60–0.93)
GFR, Est African American: 82 mL/min/{1.73_m2} (ref >=60)
GFR, Est Non African American: 71 mL/min/{1.73_m2} (ref >=60)
Globulin: 2.6 g/dL (calc) (ref 1.9–3.7)
Glucose, Bld: 82 mg/dL (ref 65–99)
Potassium: 4.1 mmol/L (ref 3.5–5.3)
Sodium: 144 mmol/L (ref 135–146)
Total Bilirubin: 0.8 mg/dL (ref 0.2–1.2)
Total Protein: 6.5 g/dL (ref 6.1–8.1)

## 2020-09-25 LAB — LIPID PANEL
Cholesterol: 130 mg/dL (ref <200)
HDL: 76 mg/dL (ref >=50)
LDL Cholesterol (Calc): 40 mg/dL (calc)
Non-HDL Cholesterol (Calc): 54 mg/dL (calc) (ref <130)
Total CHOL/HDL Ratio: 1.7 (calc) (ref <5.0)
Triglycerides: 62 mg/dL (ref <150)

## 2020-10-02 ENCOUNTER — Ambulatory Visit: Payer: Medicare Other | Admitting: Family Medicine

## 2020-11-04 ENCOUNTER — Encounter: Payer: Self-pay | Admitting: Family Medicine

## 2020-11-05 MED ORDER — GABAPENTIN 300 MG PO CAPS: 300.0000 mg | ORAL_CAPSULE | Freq: Three times a day (TID) | ORAL | 1 refills | Status: DC

## 2020-11-05 MED ORDER — MIRABEGRON ER 50 MG PO TB24: 50.0000 mg | ORAL_TABLET | Freq: Every day | ORAL | 1 refills | Status: DC

## 2020-11-05 NOTE — Telephone Encounter (Signed)
Meds ordered this encounter  Medications   gabapentin (NEURONTIN) 300 MG capsule    Sig: Take 1 capsule (300 mg total) by mouth 3 (three) times daily.    Dispense:  270 capsule    Refill:  1   mirabegron ER (MYRBETRIQ) 50 MG TB24 tablet    Sig: Take 1 tablet (50 mg total) by mouth daily.    Dispense:  90 tablet    Refill:  1

## 2020-11-08 ENCOUNTER — Other Ambulatory Visit: Payer: Self-pay | Admitting: *Deleted

## 2020-11-08 DIAGNOSIS — M19012 Primary osteoarthritis, left shoulder: Secondary | ICD-10-CM

## 2020-11-08 MED ORDER — TRAMADOL HCL 50 MG PO TABS: 50.0000 mg | ORAL_TABLET | Freq: Two times a day (BID) | ORAL | 0 refills | Status: DC | PRN

## 2020-11-13 ENCOUNTER — Encounter: Payer: Self-pay | Admitting: Family Medicine

## 2020-11-13 DIAGNOSIS — M19012 Primary osteoarthritis, left shoulder: Secondary | ICD-10-CM

## 2020-11-13 MED ORDER — TRAMADOL HCL 50 MG PO TABS: 50.0000 mg | ORAL_TABLET | Freq: Two times a day (BID) | ORAL | 0 refills | Status: DC | PRN

## 2020-11-13 NOTE — Telephone Encounter (Signed)
Meds ordered this encounter  Medications   traMADol (ULTRAM) 50 MG tablet    Sig: Take 1-2 tablets (50-100 mg total) by mouth 2 (two) times daily as needed.    Dispense:  90 tablet    Refill:  0    Not to exceed 5 additional fills before 01/25/2020 DX Code Needed  .

## 2020-12-19 ENCOUNTER — Other Ambulatory Visit: Payer: Self-pay | Admitting: Family Medicine

## 2020-12-19 DIAGNOSIS — D649 Anemia, unspecified: Secondary | ICD-10-CM

## 2020-12-23 ENCOUNTER — Other Ambulatory Visit: Payer: Self-pay

## 2020-12-23 ENCOUNTER — Ambulatory Visit (INDEPENDENT_AMBULATORY_CARE_PROVIDER_SITE_OTHER): Payer: Medicare Other | Admitting: Family Medicine

## 2020-12-23 ENCOUNTER — Encounter: Payer: Self-pay | Admitting: Family Medicine

## 2020-12-23 VITALS — BP 97/75 | HR 66 | Ht 62.0 in | Wt 160.0 lb

## 2020-12-23 DIAGNOSIS — I10 Essential (primary) hypertension: Secondary | ICD-10-CM | POA: Diagnosis not present

## 2020-12-23 DIAGNOSIS — M19012 Primary osteoarthritis, left shoulder: Secondary | ICD-10-CM

## 2020-12-23 DIAGNOSIS — E785 Hyperlipidemia, unspecified: Secondary | ICD-10-CM

## 2020-12-23 DIAGNOSIS — Z23 Encounter for immunization: Secondary | ICD-10-CM | POA: Diagnosis not present

## 2020-12-23 DIAGNOSIS — R29898 Other symptoms and signs involving the musculoskeletal system: Secondary | ICD-10-CM

## 2020-12-23 DIAGNOSIS — M533 Sacrococcygeal disorders, not elsewhere classified: Secondary | ICD-10-CM

## 2020-12-23 DIAGNOSIS — M25552 Pain in left hip: Secondary | ICD-10-CM

## 2020-12-23 DIAGNOSIS — R600 Localized edema: Secondary | ICD-10-CM | POA: Diagnosis not present

## 2020-12-23 DIAGNOSIS — I890 Lymphedema, not elsewhere classified: Secondary | ICD-10-CM | POA: Diagnosis not present

## 2020-12-23 MED ORDER — HYDROXYCHLOROQUINE SULFATE 200 MG PO TABS: ORAL_TABLET | ORAL | 6 refills | Status: DC

## 2020-12-23 MED ORDER — BUMETANIDE 0.5 MG PO TABS: 0.2500 mg | ORAL_TABLET | Freq: Every morning | ORAL | Status: DC

## 2020-12-23 MED ORDER — ROSUVASTATIN CALCIUM 5 MG PO TABS: 5.0000 mg | ORAL_TABLET | Freq: Every day | ORAL | 3 refills | Status: DC

## 2020-12-23 MED ORDER — NYSTATIN 100000 UNIT/GM EX POWD: CUTANEOUS | 11 refills | Status: DC

## 2020-12-23 MED ORDER — AZELEX 20 % EX CREA: TOPICAL_CREAM | CUTANEOUS | 6 refills | Status: DC

## 2020-12-23 MED ORDER — FLUOXETINE HCL 20 MG PO TABS: 20.0000 mg | ORAL_TABLET | Freq: Every day | ORAL | 3 refills | Status: DC

## 2020-12-23 MED ORDER — TRAMADOL HCL 50 MG PO TABS: 50.0000 mg | ORAL_TABLET | Freq: Two times a day (BID) | ORAL | 0 refills | Status: DC | PRN

## 2020-12-23 MED ORDER — MIRABEGRON ER 50 MG PO TB24: 50.0000 mg | ORAL_TABLET | Freq: Every day | ORAL | 1 refills | Status: DC

## 2020-12-23 MED ORDER — LOSARTAN POTASSIUM 25 MG PO TABS: 25.0000 mg | ORAL_TABLET | Freq: Every day | ORAL | 0 refills | Status: DC

## 2020-12-23 MED ORDER — POTASSIUM CHLORIDE ER 10 MEQ PO TBCR: 10.0000 meq | EXTENDED_RELEASE_TABLET | Freq: Two times a day (BID) | ORAL | 11 refills | Status: DC

## 2020-12-23 NOTE — Assessment & Plan Note (Signed)
Discussed using a U-shaped cushion to take pressure off of the coccyx.

## 2020-12-23 NOTE — Assessment & Plan Note (Signed)
Only taking a half a tab of the 10 mg Crestor.  So we will go ahead and switch to 5 mg.

## 2020-12-23 NOTE — Assessment & Plan Note (Signed)
BP a little soft today.  Will decrease losartan to 25 mg new prescription sent to pharmacy we might even have her start skipping the Bumex and taking it every other day since her lower extremity edema has improved and has been better controlled.  She was a little frustrated that she gained a couple pounds on her recent trip but plans to get it back off.

## 2020-12-23 NOTE — Assessment & Plan Note (Signed)
Controlled with daily compression socks.  Still taking a half a tab of Bumex daily.

## 2020-12-23 NOTE — Progress Notes (Addendum)
Established Patient Office Visit  Subjective:  Patient ID: Joann Maddox, female    DOB: 07/27/1940  Age: 80 y.o. MRN: [id number]  CC: No chief complaint on file.   HPI Joann Maddox presents for   Hypertension- Pt denies chest pain, SOB, dizziness, or heart palpitations.  Taking meds as directed w/o problems.  Denies medication side effects.    Right hip pain -its been bothering her for several weeks.  She and her husband went out west and drove their so 7000 miles in the car.  She says is mostly hurting posteriorly she does have a prior history of hip replacement she did fall a couple times while there but says none of those times that she actually fall or hit her hip.  Husband is concerned because he has noticed that she is very weak and her balance seems off especially when she tries to go from sitting to standing.  Her symptoms even when she is completely set up she will actually fall backwards.  She has been wearing her compression stockings lately which has been great it is really been helping to control the fluid she currently takes a half of a tab 0.5 mg Bumex.  Is also having a lot of pain over her tailbone just feels really sore and tender at times.  And she has been battling a yeast infection in the buttock crease.  She has been using nystatin powder and feels like it has gotten some better.  She is only taking a half of a tab of her Crestor so currently taking 5 mg total.  Past Medical History:  Diagnosis Date   Anxiety    Arthritis    Depression    Headache(784.0)    migrains   Heart murmur    Hypothyroidism    Pre-diabetes    Rheumatoid arthritis(714.0)     Past Surgical History:  Procedure Laterality Date   ABDOMINAL HYSTERECTOMY     ANTERIOR CERVICAL DECOMP/DISCECTOMY FUSION N/A 01/03/2018   Procedure: ANTERIOR CERVICAL DECOMPRESSION/DISCECTOMY FUSION, ALLOGRAFT, PLATE;  Surgeon: Eldred Manges, MD;  Location: [facility];  Service: Orthopedics;  Laterality: N/A;    APPENDECTOMY     CHOLECYSTECTOMY     REPLACEMENT TOTAL KNEE BILATERAL     TONSILLECTOMY     as  a child   TOTAL HIP ARTHROPLASTY     right and left    Family History  Problem Relation Age of Onset   Heart disease Father 85   Diabetes Other        aunt    Stroke Mother 51    Social History   Socioeconomic History   Marital status: Married    Spouse name: Dorene Sorrow   Number of children: 2   Years of education: Not on file   Highest education level: Not on file  Occupational History   Occupation: Retired Runner, broadcasting/film/video    Comment: Corporate treasurer  Tobacco Use   Smoking status: Never   Smokeless tobacco: Never  Substance and Sexual Activity   Alcohol use: No   Drug use: Not on file   Sexual activity: Not on file  Other Topics Concern   Not on file  Social History Narrative   No regular exercise. 2 caffeine drinks per day.    Social Determinants of Health   Financial Resource Strain: Not on file  Food Insecurity: Not on file  Transportation Needs: Not on file  Physical Activity: Not on file  Stress: Not on file  Social Connections: Not on file  Intimate Partner Violence: Not on file    Outpatient Medications Prior to Visit  Medication Sig Dispense Refill   gabapentin (NEURONTIN) 300 MG capsule Take 1 capsule (300 mg total) by mouth 3 (three) times daily. 270 capsule 1   Multiple Vitamin (MULTIVITAMIN) tablet Take 1 tablet by mouth daily.     azelaic acid (AZELEX) 20 % cream APPLY TOPICALL 2 TIMES A DAY (MORNING AND EVENING). APPLY AFTER SKIN IS WASHED AND PATTED DRY. 30 g 6   bumetanide (BUMEX) 0.5 MG tablet Take 1 tablet (0.5 mg total) by mouth in the morning. (Patient taking differently: Take 0.25 mg by mouth in the morning.) 90 tablet 0   FLUoxetine (PROZAC) 20 MG tablet TAKE 1 TABLET BY MOUTH EVERY DAY 90 tablet 4   hydroxychloroquine (PLAQUENIL) 200 MG tablet TALE 2 TABLETS BY MOUTH WITH FOOD OR MILK ONCE A DAY 60 tablet 6   losartan (COZAAR) 50 MG tablet TAKE 1  TABLET BY MOUTH EVERY DAY 90 tablet 1   mirabegron ER (MYRBETRIQ) 50 MG TB24 tablet Take 1 tablet (50 mg total) by mouth daily. 90 tablet 1   nystatin (MYCOSTATIN/NYSTOP) powder APPLY 1 GRAM TO AFFECTED AREA 2 TIMES DAILY. 60 g 11   potassium chloride (KLOR-CON) 10 MEQ tablet TAKE 1 TABLET (10 MEQ TOTAL) BY MOUTH 2 (TWO) TIMES DAILY. 60 tablet 11   rosuvastatin (CRESTOR) 10 MG tablet TAKE 1 TABLET BY MOUTH EVERYDAY AT BEDTIME (Patient taking differently: 5 mg.) 90 tablet 3   traMADol (ULTRAM) 50 MG tablet Take 1-2 tablets (50-100 mg total) by mouth 2 (two) times daily as needed. 90 tablet 0   No facility-administered medications prior to visit.    Allergies  Allergen Reactions   Iodine Other (See Comments)    Shook violently and passed out.   Codeine Other (See Comments)    Hallucinations   Lipitor [Atorvastatin] Other (See Comments)    Myalgias    ROS Review of Systems    Objective:    Physical Exam  BP 97/75   Pulse 66   Ht 5\' 2"  (1.575 m)   Wt 160 lb (72.6 kg)   SpO2 100% Comment: on RA  BMI 29.26 kg/m  Wt Readings from Last 3 Encounters:  12/23/20 160 lb (72.6 kg)  09/24/20 154 lb (69.9 kg)  07/02/20 163 lb (73.9 kg)     Health Maintenance Due  Topic Date Due   OPHTHALMOLOGY EXAM  Never done   Hepatitis C Screening  Never done   Zoster Vaccines- Shingrix (1 of 2) Never done   COVID-19 Vaccine (4 - Booster for Pfizer series) 08/13/2020    There are no preventive care reminders to display for this patient.  Lab Results  Component Value Date   TSH 0.44 09/18/2020   Lab Results  Component Value Date   WBC 5.2 08/21/2019   HGB 11.9 08/21/2019   HCT 35.9 08/21/2019   MCV 90.0 08/21/2019   PLT 204 08/21/2019   Lab Results  Component Value Date   NA 144 09/24/2020   K 4.1 09/24/2020   CO2 27 09/24/2020   GLUCOSE 82 09/24/2020   BUN 21 09/24/2020   CREATININE 0.80 09/24/2020   BILITOT 0.8 09/24/2020   ALKPHOS 66 12/23/2017   AST 24 09/24/2020    ALT 16 09/24/2020   PROT 6.5 09/24/2020   ALBUMIN 3.7 12/23/2017   CALCIUM 9.6 09/24/2020   ANIONGAP 10 01/04/2018   Lab Results  Component  Value Date   CHOL 130 09/24/2020   Lab Results  Component Value Date   HDL 76 09/24/2020   Lab Results  Component Value Date   LDLCALC 40 09/24/2020   Lab Results  Component Value Date   TRIG 62 09/24/2020   Lab Results  Component Value Date   CHOLHDL 1.7 09/24/2020   Lab Results  Component Value Date   HGBA1C 5.1 07/02/2020      Assessment & Plan:   Problem List Items Addressed This Visit       Cardiovascular and Mediastinum   Essential hypertension, benign    BP a little soft today.  Will decrease losartan to 25 mg new prescription sent to pharmacy we might even have her start skipping the Bumex and taking it every other day since her lower extremity edema has improved and has been better controlled.  She was a little frustrated that she gained a couple pounds on her recent trip but plans to get it back off.      Relevant Medications   bumetanide (BUMEX) 0.5 MG tablet   rosuvastatin (CRESTOR) 5 MG tablet   losartan (COZAAR) 25 MG tablet     Musculoskeletal and Integument   Primary osteoarthritis of left shoulder   Relevant Medications   hydroxychloroquine (PLAQUENIL) 200 MG tablet   traMADol (ULTRAM) 50 MG tablet     Other   Hyperlipidemia    Only taking a half a tab of the 10 mg Crestor.  So we will go ahead and switch to 5 mg.      Relevant Medications   bumetanide (BUMEX) 0.5 MG tablet   rosuvastatin (CRESTOR) 5 MG tablet   losartan (COZAAR) 25 MG tablet   Coccydynia    Discussed using a U-shaped cushion to take pressure off of the coccyx.      Relevant Medications   traMADol (ULTRAM) 50 MG tablet   Bilateral lower extremity edema    Controlled with daily compression socks.  Still taking a half a tab of Bumex daily.      Other Visit Diagnoses     Need for influenza vaccination    -  Primary    Relevant Orders   Flu Vaccine QUAD High Dose(Fluad) (Completed)   Lymphedema       Relevant Medications   bumetanide (BUMEX) 0.5 MG tablet   Lower extremity edema       Relevant Medications   bumetanide (BUMEX) 0.5 MG tablet   Left hip pain       Relevant Orders   Ambulatory referral to Physical Therapy   Weakness of both lower extremities       Relevant Orders   Ambulatory referral to Physical Therapy      Left hip pain-I do think it is probably from prolonged sitting she actually has good range of motion and normal strength.  I am going to recommend physical therapy to help with her discomfort and pain the tramadol does seem to help so we will go ahead and refill that today as well.  Weakness of both lower extremities-recommend referral to PT and OT.  Meds ordered this encounter  Medications   azelaic acid (AZELEX) 20 % cream    Sig: APPLY TOPICALL 2 TIMES A DAY (MORNING AND EVENING). APPLY AFTER SKIN IS WASHED AND PATTED DRY.    Dispense:  30 g    Refill:  6   bumetanide (BUMEX) 0.5 MG tablet    Sig: Take 0.5 tablets (0.25 mg total) by  mouth in the morning.    Pt will call when needed   FLUoxetine (PROZAC) 20 MG tablet    Sig: Take 1 tablet (20 mg total) by mouth daily.    Dispense:  90 tablet    Refill:  3   hydroxychloroquine (PLAQUENIL) 200 MG tablet    Sig: TALE 2 TABLETS BY MOUTH WITH FOOD OR MILK ONCE A DAY    Dispense:  60 tablet    Refill:  6   mirabegron ER (MYRBETRIQ) 50 MG TB24 tablet    Sig: Take 1 tablet (50 mg total) by mouth daily.    Dispense:  90 tablet    Refill:  1   nystatin (MYCOSTATIN/NYSTOP) powder    Sig: Apply 1 gram to affected area 2 times daily    Dispense:  60 g    Refill:  11   potassium chloride (KLOR-CON) 10 MEQ tablet    Sig: Take 1 tablet (10 mEq total) by mouth 2 (two) times daily.    Dispense:  60 tablet    Refill:  11   rosuvastatin (CRESTOR) 5 MG tablet    Sig: Take 1 tablet (5 mg total) by mouth daily.    Dispense:  90  tablet    Refill:  3   traMADol (ULTRAM) 50 MG tablet    Sig: Take 1-2 tablets (50-100 mg total) by mouth 2 (two) times daily as needed.    Dispense:  90 tablet    Refill:  0    Not to exceed 5 additional fills before 01/25/2020 DX Code Needed  .   losartan (COZAAR) 25 MG tablet    Sig: Take 1 tablet (25 mg total) by mouth daily.    Dispense:  90 tablet    Refill:  0    Follow-up: Return in about 2 weeks (around 01/06/2021) for Schedule Medicare wellness exam with [facility] and see PCP in 8 weeks for BP.    Nani Gasser, MD

## 2020-12-30 ENCOUNTER — Other Ambulatory Visit: Payer: Self-pay | Admitting: Family Medicine

## 2021-01-06 ENCOUNTER — Ambulatory Visit: Payer: Medicare Other | Admitting: Rehabilitative and Restorative Service Providers"

## 2021-01-06 ENCOUNTER — Ambulatory Visit (INDEPENDENT_AMBULATORY_CARE_PROVIDER_SITE_OTHER): Payer: Medicare Other | Admitting: Family Medicine

## 2021-01-06 VITALS — BP 109/49 | HR 56 | Wt 162.7 lb

## 2021-01-06 DIAGNOSIS — Z0000 Encounter for general adult medical examination without abnormal findings: Secondary | ICD-10-CM | POA: Diagnosis not present

## 2021-01-06 NOTE — Progress Notes (Signed)
MEDICARE ANNUAL WELLNESS VISIT  01/06/2021  Subjective:  Joann Maddox is a 80 y.o. female patient of Metheney, Barbarann Ehlers, MD who had a Medicare Annual Wellness Visit today. Bill is Retired and lives with their spouse. she has 2 children. she reports that she is socially active and does interact with friends/family regularly. she is minimally physically active and enjoys reading.  Patient Care Team: Agapito Games, MD as PCP - General (Family Medicine)  Advanced Directives 01/06/2021 01/19/2019 05/31/2018 01/03/2018 01/03/2018 12/23/2017 06/02/2017  Does Patient Have a Medical Advance Directive? No No Yes No - No No  Type of Advance Directive - Web designer;Living will - - - -  Copy of Healthcare Power of Attorney in Chart? - - Yes - validated most recent copy scanned in chart (See row information) - - - -  Would patient like information on creating a medical advance directive? No - Patient declined No - Patient declined - No - Patient declined No - Patient declined Yes (MAU/Ambulatory/Procedural Areas - Information given) Yes (Inpatient - patient defers creating a medical advance directive at this time)    Hospital Utilization Over the Past 12 Months: # of hospitalizations or ER visits: 1 # of surgeries: 0  Review of Systems    Patient reports that her overall health is unchanged when compared to last year.  Review of Systems: History obtained from chart review, the patient, and her husband.  All other systems negative.  Pain Assessment Pain : No/denies pain     Current Medications & Allergies (verified) Allergies as of 01/06/2021       Reactions   Iodine Other (See Comments)   Shook violently and passed out.   Codeine Other (See Comments)   Hallucinations   Lipitor [atorvastatin] Other (See Comments)   Myalgias        Medication List        Accurate as of January 06, 2021 11:43 AM. If you have any questions, ask your nurse or doctor.           Azelex 20 % cream Generic drug: azelaic acid APPLY TOPICALL 2 TIMES A DAY (MORNING AND EVENING). APPLY AFTER SKIN IS WASHED AND PATTED DRY.   bumetanide 0.5 MG tablet Commonly known as: BUMEX Take 0.5 tablets (0.25 mg total) by mouth in the morning.   diclofenac Sodium 1 % Gel Commonly known as: VOLTAREN Apply topically.   FLUoxetine 20 MG tablet Commonly known as: PROZAC Take 1 tablet (20 mg total) by mouth daily.   gabapentin 300 MG capsule Commonly known as: NEURONTIN Take 1 capsule (300 mg total) by mouth 3 (three) times daily.   hydroxychloroquine 200 MG tablet Commonly known as: PLAQUENIL TALE 2 TABLETS BY MOUTH WITH FOOD OR MILK ONCE A DAY   losartan 25 MG tablet Commonly known as: COZAAR Take 1 tablet (25 mg total) by mouth daily.   mirabegron ER 50 MG Tb24 tablet Commonly known as: Myrbetriq Take 1 tablet (50 mg total) by mouth daily.   multivitamin tablet Take 1 tablet by mouth daily.   nystatin powder Commonly known as: MYCOSTATIN/NYSTOP Apply 1 gram to affected area 2 times daily   potassium chloride 10 MEQ tablet Commonly known as: KLOR-CON Take 1 tablet (10 mEq total) by mouth 2 (two) times daily.   rosuvastatin 5 MG tablet Commonly known as: CRESTOR Take 1 tablet (5 mg total) by mouth daily.   traMADol 50 MG tablet Commonly known as: ULTRAM Take 1-2 tablets (  50-100 mg total) by mouth 2 (two) times daily as needed.   Vitamin D (Ergocalciferol) 1.25 MG (50000 UNIT) Caps capsule Commonly known as: DRISDOL Take 50,000 Units by mouth once a week.        History (reviewed): Past Medical History:  Diagnosis Date   Anxiety    Arthritis    Depression    Headache(784.0)    migrains   Heart murmur    Hypothyroidism    Pre-diabetes    Rheumatoid arthritis(714.0)    Past Surgical History:  Procedure Laterality Date   ABDOMINAL HYSTERECTOMY     ANTERIOR CERVICAL DECOMP/DISCECTOMY FUSION N/A 01/03/2018   Procedure: ANTERIOR CERVICAL  DECOMPRESSION/DISCECTOMY FUSION, ALLOGRAFT, PLATE;  Surgeon: Eldred Manges, MD;  Location: [facility] OR;  Service: Orthopedics;  Laterality: N/A;   APPENDECTOMY     CHOLECYSTECTOMY     REPLACEMENT TOTAL KNEE BILATERAL     TONSILLECTOMY     as  a child   TOTAL HIP ARTHROPLASTY     right and left   Family History  Problem Relation Age of Onset   Heart disease Father 76   Diabetes Other        aunt    Stroke Mother 82   Social History   Socioeconomic History   Marital status: Married    Spouse name: Dorene Sorrow   Number of children: 2   Years of education: 16   Highest education level: Bachelor's degree (e.g., BA, AB, BS)  Occupational History   Occupation: Retired Runner, broadcasting/film/video    Comment: Corporate treasurer  Tobacco Use   Smoking status: Never   Smokeless tobacco: Never  Substance and Sexual Activity   Alcohol use: No   Drug use: Never   Sexual activity: Not on file  Other Topics Concern   Not on file  Social History Narrative   Lives with her husband. She has two sons. She enjoys reading.   Social Determinants of Health   Financial Resource Strain: Low Risk    Difficulty of Paying Living Expenses: Not hard at all  Food Insecurity: No Food Insecurity   Worried About Programme researcher, broadcasting/film/video in the Last Year: Never true   Ran Out of Food in the Last Year: Never true  Transportation Needs: No Transportation Needs   Lack of Transportation (Medical): No   Lack of Transportation (Non-Medical): No  Physical Activity: Inactive   Days of Exercise per Week: 0 days   Minutes of Exercise per Session: 0 min  Stress: No Stress Concern Present   Feeling of Stress : Not at all  Social Connections: Socially Integrated   Frequency of Communication with Friends and Family: More than three times a week   Frequency of Social Gatherings with Friends and Family: Once a week   Attends Religious Services: More than 4 times per year   Active Member of [employer] or Organizations: Yes   Attends Banker  Meetings: More than 4 times per year   Marital Status: Married    Activities of Daily Living In your present state of health, do you have any difficulty performing the following activities: 01/06/2021  Hearing? Y  Comment some hearing loss.  Vision? N  Difficulty concentrating or making decisions? Y  Comment Has difficulty making decisions and some memory issues.  Walking or climbing stairs? Y  Comment use a cane. She doesn't like to go on the stairs but can if need to.  Dressing or bathing? Y  Comment Due to shoulder pain,  putting her socks and shoes on.  Doing errands, shopping? Y  Comment usually her husband drives her.  Preparing Food and eating ? N  Using the Toilet? N  In the past six months, have you accidently leaked urine? Y  Comment She wears a pad/depends. Per husband states, Leaking urine is due to the medications that she is currently on.  Do you have problems with loss of bowel control? N  Managing your Medications? Y  Comment Her husband sets up her medication.  Managing your Finances? Y  Comment Her husband helps her with it.  Housekeeping or managing your Housekeeping? Y  Comment Her husband helps with her.  Some recent data might be hidden    Patient Education/Literacy How often do you need to have someone help you when you read instructions, pamphlets, or other written materials from your doctor or pharmacy?: 1 - Never What is the last grade level you completed in school?: Bachelor's Degree  Exercise Current Exercise Habits: The patient does not participate in regular exercise at present, Exercise limited by: orthopedic condition(s)  Diet Patient reports consuming 3 meals a day and 3 snack(s) a day Patient reports that her primary diet is: Regular Patient reports that she does have regular access to food.   Depression Screen PHQ 2/9 Scores 12/23/2020 09/24/2020 10/20/2019 06/22/2019 04/13/2019 03/01/2019 02/01/2019  PHQ - 2 Score 0 0 3 0 PHQ- 9 Score -  Fall Risk Fall Risk  01/06/2021 12/23/2020 09/24/2020 06/28/2019 03/01/2019  Falls in the past year? 1 1 0 1 1  Number falls in past yr: 1 1 0 1 1  Injury with Fall? 1 1 0 1 1  Risk Factor Category  - - - - -  Risk for fall due to : History of fall(s);Impaired balance/gait;Impaired mobility History of fall(s);Impaired balance/gait;Impaired mobility History of fall(s) Other (Comment) History of fall(s);Impaired balance/gait  Risk for fall due to: Comment - - - pt was bending over to fill her dogs bowl and fell forward -  Follow up Falls evaluation completed;Education provided;Falls prevention discussed Falls evaluation completed Falls prevention discussed;Falls evaluation completed Falls prevention discussed Falls prevention discussed     Objective:   BP (!) 109/49 (BP Location: Right Arm, Patient Position: Sitting, Cuff Size: Normal)   Pulse (!) 56   Wt 162 lb 11.2 oz (73.8 kg)   SpO2 99%   BMI 29.76 kg/m   Last Weight  Most recent update: 01/06/2021 11:06 AM    Weight  73.8 kg (162 lb 11.2 oz)             Body mass index is 29.76 kg/m.  Hearing/Vision  Samera did not have difficulty with hearing/understanding during the face-to-face interview Nesa did not have difficulty with her vision during the face-to-face interview Reports that she has had a formal eye exam by an eye care professional within the past year Reports that she has not had a formal hearing evaluation within the past year  Cognitive Function: 6CIT Screen 01/06/2021  What Year? 0 points  What month? 0 points  What time? 0 points  Count back from 20 0 points  Months in reverse 0 points  Repeat phrase 0 points  Total Score 0    Normal Cognitive Function Screening: Yes (Normal:0-7, Significant for Dysfunction: >8)  Immunization & Health Maintenance Record Immunization History  Administered Date(s) Administered   Fluad Quad(high Dose 65+) 01/10/2019, 02/05/2020, 12/23/2020  Influenza Split  03/16/2007, 02/20/2010   Influenza, High Dose Seasonal PF 06/30/2017   Influenza,inj,Quad PF,6+ Mos 01/12/2012, 02/19/2014   Influenza-Unspecified 01/12/2012, 03/28/2016   Moderna Sars-Covid-2 Vaccination 04/14/2020   PFIZER(Purple Top)SARS-COV-2 Vaccination 05/26/2019, 06/23/2019   Pneumococcal Conjugate-13 11/29/2017   Pneumococcal Polysaccharide-23 05/05/2007, 04/27/2008, 02/21/2009   Tdap 01/11/2020   Zoster, Live 04/27/2010, 11/18/2010    Health Maintenance  Topic Date Due   OPHTHALMOLOGY EXAM  01/06/2021 (Originally 06/25/1950)   COVID-19 Vaccine (4 - Booster for Pfizer series) 01/22/2021 (Originally 08/13/2020)   HEMOGLOBIN A1C  01/24/2021 (Originally 01/02/2021)   Zoster Vaccines- Shingrix (1 of 2) 04/07/2021 (Originally 06/25/1992)   Hepatitis C Screening  01/06/2022 (Originally 06/25/1958)   FOOT EXAM  09/24/2021   TETANUS/TDAP  01/10/2030   INFLUENZA VACCINE  Completed   DEXA SCAN  Completed   PNA vac Low Risk Adult  Completed   HPV VACCINES  Aged Out       Assessment  This is a routine wellness examination for [employer].  Health Maintenance: Due or Overdue There are no preventive care reminders to display for this patient.   Arnoldo Morale Orrico does not need a referral for Community Assistance: Care Management:   no Social Work:    no Prescription Assistance:  no Nutrition/Diabetes Education:  no   Plan:  Personalized Goals  Goals Addressed               This Visit's Progress     Patient Stated (pt-stated)        01/06/2021 AWV Goal: Exercise for General Health  Patient will verbalize understanding of the benefits of increased physical activity: Exercising regularly is important. It will improve your overall fitness, flexibility, and endurance. Regular exercise also will improve your overall health. It can help you control your weight, reduce stress, and improve your bone density. Over the next year, patient will increase physical activity as tolerated with  a goal of at least 150 minutes of moderate physical activity per week.  You can tell that you are exercising at a moderate intensity if your heart starts beating faster and you start breathing faster but can still hold a conversation. Moderate-intensity exercise ideas include: Walking 1 mile (1.6 km) in about 15 minutes Biking Hiking Golfing Dancing Water aerobics Patient will verbalize understanding of everyday activities that increase physical activity by providing examples like the following: Yard work, such as: Insurance underwriter Gardening Washing windows or floors Patient will be able to explain general safety guidelines for exercising:  Before you start a new exercise program, talk with your health care provider. Do not exercise so much that you hurt yourself, feel dizzy, or get very short of breath. Wear comfortable clothes and wear shoes with good support. Drink plenty of water while you exercise to prevent dehydration or heat stroke. Work out until your breathing and your heartbeat get faster.        Personalized Health Maintenance & Screening Recommendations  Shingles vaccine Eye exam- Patient had one within the year and will get [place] the report.  Lung Cancer Screening Recommended: no (Low Dose CT Chest recommended if Age 32-80 years, 30 pack-year currently smoking OR have quit w/in past 15 years) Hepatitis C Screening recommended: yes HIV Screening recommended: no  Advanced Directives: Written information was not given per the patient's request.  Referrals & Orders No orders of the defined types were placed in this encounter.  Follow-up Plan Follow-up with Agapito Games, MD as planned Please let me know when your last eye exam was. Schedule your shingles vaccine at your pharmacy. Medicare wellness visit in one year. AVS printed and given to the patient.   I have  personally reviewed and noted the following in the patient's chart:   Medical and social history Use of alcohol, tobacco or illicit drugs  Current medications and supplements Functional ability and status Nutritional status Physical activity Advanced directives List of other physicians Hospitalizations, surgeries, and ER visits in previous 12 months Vitals Screenings to include cognitive, depression, and falls Referrals and appointments  In addition, I have reviewed and discussed with patient certain preventive protocols, quality metrics, and best practice recommendations. A written personalized care plan for preventive services as well as general preventive health recommendations were provided to patient.     Modesto Charon, RN  01/06/2021

## 2021-01-06 NOTE — Patient Instructions (Addendum)
MEDICARE ANNUAL WELLNESS VISIT Health Maintenance Summary and Written Plan of Care  Ms. Bolotin ,  Thank you for allowing me to perform your Medicare Annual Wellness Visit and for your ongoing commitment to your health.   Health Maintenance & Immunization History Health Maintenance  Topic Date Due   OPHTHALMOLOGY EXAM  01/06/2021 (Originally 06/25/1950)   COVID-19 Vaccine (4 - Booster for Pfizer series) 01/22/2021 (Originally 08/13/2020)   HEMOGLOBIN A1C  01/24/2021 (Originally 01/02/2021)   Zoster Vaccines- Shingrix (1 of 2) 04/07/2021 (Originally 06/25/1992)   Hepatitis C Screening  01/06/2022 (Originally 06/25/1958)   FOOT EXAM  09/24/2021   TETANUS/TDAP  01/10/2030   INFLUENZA VACCINE  Completed   DEXA SCAN  Completed   PNA vac Low Risk Adult  Completed   HPV VACCINES  Aged Out   Immunization History  Administered Date(s) Administered   Fluad Quad(high Dose 65+) 01/10/2019, 02/05/2020, 12/23/2020   Influenza Split 03/16/2007, 02/20/2010   Influenza, High Dose Seasonal PF 06/30/2017   Influenza,inj,Quad PF,6+ Mos 01/12/2012, 02/19/2014   Influenza-Unspecified 01/12/2012, 03/28/2016   Moderna Sars-Covid-2 Vaccination 04/14/2020   PFIZER(Purple Top)SARS-COV-2 Vaccination 05/26/2019, 06/23/2019   Pneumococcal Conjugate-13 11/29/2017   Pneumococcal Polysaccharide-23 05/05/2007, 04/27/2008, 02/21/2009   Tdap 01/11/2020   Zoster, Live 04/27/2010, 11/18/2010    These are the patient goals that we discussed:  Goals Addressed               This Visit's Progress     Patient Stated (pt-stated)        01/06/2021 AWV Goal: Exercise for General Health  Patient will verbalize understanding of the benefits of increased physical activity: Exercising regularly is important. It will improve your overall fitness, flexibility, and endurance. Regular exercise also will improve your overall health. It can help you control your weight, reduce stress, and improve your bone density. Over the  next year, patient will increase physical activity as tolerated with a goal of at least 150 minutes of moderate physical activity per week.  You can tell that you are exercising at a moderate intensity if your heart starts beating faster and you start breathing faster but can still hold a conversation. Moderate-intensity exercise ideas include: Walking 1 mile (1.6 km) in about 15 minutes Biking Hiking Golfing Dancing Water aerobics Patient will verbalize understanding of everyday activities that increase physical activity by providing examples like the following: Yard work, such as: Insurance underwriter Gardening Washing windows or floors Patient will be able to explain general safety guidelines for exercising:  Before you start a new exercise program, talk with your health care provider. Do not exercise so much that you hurt yourself, feel dizzy, or get very short of breath. Wear comfortable clothes and wear shoes with good support. Drink plenty of water while you exercise to prevent dehydration or heat stroke. Work out until your breathing and your heartbeat get faster.          This is a list of Health Maintenance Items that are overdue or due now: Shingles vaccine Eye exam- Patient had one within the year and will get [place] the report.    Orders/Referrals Placed Today: No orders of the defined types were placed in this encounter.  (Contact our referral department at [phone] if you have not spoken with someone about your referral appointment within the next 5 days)    Follow-up Plan Follow-up with Agapito Games, MD as planned Please let me  know when your last eye exam was. Schedule your shingles vaccine at your pharmacy. Medicare wellness visit in one year. AVS printed and given to the patient.      Health Maintenance, Female Adopting a healthy lifestyle and getting preventive  care are important in promoting health and wellness. Ask your health care provider about: The right schedule for you to have regular tests and exams. Things you can do on your own to prevent diseases and keep yourself healthy. What should I know about diet, weight, and exercise? Eat a healthy diet  Eat a diet that includes plenty of vegetables, fruits, low-fat dairy products, and lean protein. Do not eat a lot of foods that are high in solid fats, added sugars, or sodium. Maintain a healthy weight Body mass index (BMI) is used to identify weight problems. It estimates body fat based on height and weight. Your health care provider can help determine your BMI and help you achieve or maintain a healthy weight. Get regular exercise Get regular exercise. This is one of the most important things you can do for your health. Most adults should: Exercise for at least 150 minutes each week. The exercise should increase your heart rate and make you sweat (moderate-intensity exercise). Do strengthening exercises at least twice a week. This is in addition to the moderate-intensity exercise. Spend less time sitting. Even light physical activity can be beneficial. Watch cholesterol and blood lipids Have your blood tested for lipids and cholesterol at 78 years of age, then have this test every 5 years. Have your cholesterol levels checked more often if: Your lipid or cholesterol levels are high. You are older than 78 years of age. You are at high risk for heart disease. What should I know about cancer screening? Depending on your health history and family history, you may need to have cancer screening at various ages. This may include screening for: Breast cancer. Cervical cancer. Colorectal cancer. Skin cancer. Lung cancer. What should I know about heart disease, diabetes, and high blood pressure? Blood pressure and heart disease High blood pressure causes heart disease and increases the risk of  stroke. This is more likely to develop in people who have high blood pressure readings, are of African descent, or are overweight. Have your blood pressure checked: Every 3-5 years if you are 7-80 years of age. Every year if you are 52 years old or older. Diabetes Have regular diabetes screenings. This checks your fasting blood sugar level. Have the screening done: Once every three years after age 87 if you are at a normal weight and have a low risk for diabetes. More often and at a younger age if you are overweight or have a high risk for diabetes. What should I know about preventing infection? Hepatitis B If you have a higher risk for hepatitis B, you should be screened for this virus. Talk with your health care provider to find out if you are at risk for hepatitis B infection. Hepatitis C Testing is recommended for: Everyone born from [birth year] through 1963. Anyone with known risk factors for hepatitis C. Sexually transmitted infections (STIs) Get screened for STIs, including gonorrhea and chlamydia, if: You are sexually active and are younger than 78 years of age. You are older than 78 years of age and your health care provider tells you that you are at risk for this type of infection. Your sexual activity has changed since you were last screened, and you are at increased risk for chlamydia or gonorrhea.  Ask your health care provider if you are at risk. Ask your health care provider about whether you are at high risk for HIV. Your health care provider may recommend a prescription medicine to help prevent HIV infection. If you choose to take medicine to prevent HIV, you should first get tested for HIV. You should then be tested every 3 months for as long as you are taking the medicine. Pregnancy If you are about to stop having your period (premenopausal) and you may become pregnant, seek counseling before you get pregnant. Take 400 to 800 micrograms (mcg) of folic acid every day if you become  pregnant. Ask for birth control (contraception) if you want to prevent pregnancy. Osteoporosis and menopause Osteoporosis is a disease in which the bones lose minerals and strength with aging. This can result in bone fractures. If you are 24 years old or older, or if you are at risk for osteoporosis and fractures, ask your health care provider if you should: Be screened for bone loss. Take a calcium or vitamin D supplement to lower your risk of fractures. Be given hormone replacement therapy (HRT) to treat symptoms of menopause. Follow these instructions at home: Lifestyle Do not use any products that contain nicotine or tobacco, such as cigarettes, e-cigarettes, and chewing tobacco. If you need help quitting, ask your health care provider. Do not use street drugs. Do not share needles. Ask your health care provider for help if you need support or information about quitting drugs. Alcohol use Do not drink alcohol if: Your health care provider tells you not to drink. You are pregnant, may be pregnant, or are planning to become pregnant. If you drink alcohol: Limit how much you use to 0-1 drink a day. Limit intake if you are breastfeeding. Be aware of how much alcohol is in your drink. In the [place] one drink equals one 12 oz bottle of beer (355 mL), one 5 oz glass of wine (148 mL), or one 1 oz glass of hard liquor (44 mL). General instructions Schedule regular health, dental, and eye exams. Stay current with your vaccines. Tell your health care provider if: You often feel depressed. You have ever been abused or do not feel safe at home. Summary Adopting a healthy lifestyle and getting preventive care are important in promoting health and wellness. Follow your health care provider's instructions about healthy diet, exercising, and getting tested or screened for diseases. Follow your health care provider's instructions on monitoring your cholesterol and blood pressure. This information is  not intended to replace advice given to you by your health care provider. Make sure you discuss any questions you have with your health care provider. Document Revised: 06/21/2020 Document Reviewed: 04/06/2018 Elsevier Patient Education  2022 [employer].

## 2021-01-20 ENCOUNTER — Other Ambulatory Visit: Payer: Self-pay | Admitting: Family Medicine

## 2021-01-20 DIAGNOSIS — E038 Other specified hypothyroidism: Secondary | ICD-10-CM

## 2021-01-20 DIAGNOSIS — D649 Anemia, unspecified: Secondary | ICD-10-CM

## 2021-02-14 ENCOUNTER — Encounter: Payer: Self-pay | Admitting: Family Medicine

## 2021-02-14 DIAGNOSIS — H9193 Unspecified hearing loss, bilateral: Secondary | ICD-10-CM

## 2021-02-14 NOTE — Telephone Encounter (Signed)
Orders Placed This Encounter  Procedures   Ambulatory referral to Audiology    Referral Priority:   Routine    Referral Type:   Audiology Exam    Referral Reason:   Specialty Services Required    Number of Visits Requested:   1

## 2021-02-17 ENCOUNTER — Encounter: Payer: Self-pay | Admitting: Family Medicine

## 2021-02-17 ENCOUNTER — Ambulatory Visit (INDEPENDENT_AMBULATORY_CARE_PROVIDER_SITE_OTHER): Payer: Medicare Other | Admitting: Family Medicine

## 2021-02-17 ENCOUNTER — Other Ambulatory Visit: Payer: Self-pay

## 2021-02-17 VITALS — BP 136/58 | HR 67 | Ht 62.0 in | Wt 163.0 lb

## 2021-02-17 DIAGNOSIS — E559 Vitamin D deficiency, unspecified: Secondary | ICD-10-CM

## 2021-02-17 DIAGNOSIS — R600 Localized edema: Secondary | ICD-10-CM

## 2021-02-17 DIAGNOSIS — R7301 Impaired fasting glucose: Secondary | ICD-10-CM

## 2021-02-17 DIAGNOSIS — Z1231 Encounter for screening mammogram for malignant neoplasm of breast: Secondary | ICD-10-CM | POA: Diagnosis not present

## 2021-02-17 DIAGNOSIS — E038 Other specified hypothyroidism: Secondary | ICD-10-CM

## 2021-02-17 DIAGNOSIS — F320 Major depressive disorder, single episode, mild: Secondary | ICD-10-CM | POA: Diagnosis not present

## 2021-02-17 DIAGNOSIS — I10 Essential (primary) hypertension: Secondary | ICD-10-CM | POA: Diagnosis not present

## 2021-02-17 DIAGNOSIS — Z780 Asymptomatic menopausal state: Secondary | ICD-10-CM

## 2021-02-17 LAB — POCT GLYCOSYLATED HEMOGLOBIN (HGB A1C): Hemoglobin A1C: 5.7 % — AB (ref 4.0–5.6)

## 2021-02-17 NOTE — Assessment & Plan Note (Signed)
A1C looks great at 5.7 today even though it is up a little bit from previous is still looks great.

## 2021-02-17 NOTE — Patient Instructions (Signed)
OK to try decreasing the Bumex down to half a tab daily.  If you start to retain fluid in your ankles then okay to go back up to a whole tab for couple of days and then try to go back down again.

## 2021-02-17 NOTE — Progress Notes (Signed)
Established Patient Office Visit  Subjective:  Patient ID: Joann Maddox, female    DOB: 1940-05-20  Age: 80 y.o. MRN: [id number]  CC:  Chief Complaint  Patient presents with   Hypertension    HPI Joann Maddox presents for   Impaired fasting glucose-no increased thirst or urination. No symptoms consistent with hypoglycemia.  Hypertension- Pt denies chest pain, SOB, dizziness, or heart palpitations.  Taking meds as directed w/o problems.  Denies medication side effects.  Crease losartan at her visit in August to 25 mg.  She is taking a whole tab of Bumex daily.  But wonders if she might build to start splitting it.  Be going to visit her son in November for a few weeks and then will be back in early December.  She admits her sleep has not been great she has been staying up late to read sometimes until 3 AM or 4 AM.  She knows she needs to get back on track.      Past Medical History:  Diagnosis Date   Anxiety    Arthritis    Depression    Headache(784.0)    migrains   Heart murmur    Hypothyroidism    Pre-diabetes    Rheumatoid arthritis(714.0)     Past Surgical History:  Procedure Laterality Date   ABDOMINAL HYSTERECTOMY     ANTERIOR CERVICAL DECOMP/DISCECTOMY FUSION N/A 01/03/2018   Procedure: ANTERIOR CERVICAL DECOMPRESSION/DISCECTOMY FUSION, ALLOGRAFT, PLATE;  Surgeon: Eldred Manges, MD;  Location: [facility];  Service: Orthopedics;  Laterality: N/A;   APPENDECTOMY     CHOLECYSTECTOMY     REPLACEMENT TOTAL KNEE BILATERAL     TONSILLECTOMY     as  a child   TOTAL HIP ARTHROPLASTY     right and left    Family History  Problem Relation Age of Onset   Heart disease Father 68   Diabetes Other        aunt    Stroke Mother 32    Social History   Socioeconomic History   Marital status: Married    Spouse name: Dorene Sorrow   Number of children: 2   Years of education: 16   Highest education level: Bachelor's degree (e.g., BA, AB, BS)  Occupational History    Occupation: Retired Runner, broadcasting/film/video    Comment: Corporate treasurer  Tobacco Use   Smoking status: Never   Smokeless tobacco: Never  Substance and Sexual Activity   Alcohol use: No   Drug use: Never   Sexual activity: Not on file  Other Topics Concern   Not on file  Social History Narrative   Lives with her husband. She has two sons. She enjoys reading.   Social Determinants of Health   Financial Resource Strain: Low Risk    Difficulty of Paying Living Expenses: Not hard at all  Food Insecurity: No Food Insecurity   Worried About Programme researcher, broadcasting/film/video in the Last Year: Never true   Ran Out of Food in the Last Year: Never true  Transportation Needs: No Transportation Needs   Lack of Transportation (Medical): No   Lack of Transportation (Non-Medical): No  Physical Activity: Inactive   Days of Exercise per Week: 0 days   Minutes of Exercise per Session: 0 min  Stress: No Stress Concern Present   Feeling of Stress : Not at all  Social Connections: Socially Integrated   Frequency of Communication with Friends and Family: More than three times a week   Frequency  of Social Gatherings with Friends and Family: Once a week   Attends Religious Services: More than 4 times per year   Active Member of [employer] or Organizations: Yes   Attends Engineer, structural: More than 4 times per year   Marital Status: Married  Catering manager Violence: Not At Risk   Fear of Current or Ex-Partner: No   Emotionally Abused: No   Physically Abused: No   Sexually Abused: No    Outpatient Medications Prior to Visit  Medication Sig Dispense Refill   azelaic acid (AZELEX) 20 % cream APPLY TOPICALL 2 TIMES A DAY (MORNING AND EVENING). APPLY AFTER SKIN IS WASHED AND PATTED DRY. 30 g 6   bumetanide (BUMEX) 0.5 MG tablet Take 0.5 tablets (0.25 mg total) by mouth in the morning.     diclofenac Sodium (VOLTAREN) 1 % GEL Apply topically.     FLUoxetine (PROZAC) 20 MG tablet Take 1 tablet (20 mg total) by mouth  daily. 90 tablet 3   gabapentin (NEURONTIN) 300 MG capsule Take 1 capsule (300 mg total) by mouth 3 (three) times daily. 270 capsule 1   hydroxychloroquine (PLAQUENIL) 200 MG tablet TALE 2 TABLETS BY MOUTH WITH FOOD OR MILK ONCE A DAY 60 tablet 6   losartan (COZAAR) 25 MG tablet Take 1 tablet (25 mg total) by mouth daily. 90 tablet 0   mirabegron ER (MYRBETRIQ) 50 MG TB24 tablet Take 1 tablet (50 mg total) by mouth daily. 90 tablet 1   Multiple Vitamin (MULTIVITAMIN) tablet Take 1 tablet by mouth daily.     nystatin (MYCOSTATIN/NYSTOP) powder Apply 1 gram to affected area 2 times daily 60 g 11   potassium chloride (KLOR-CON) 10 MEQ tablet Take 1 tablet (10 mEq total) by mouth 2 (two) times daily. 60 tablet 11   rosuvastatin (CRESTOR) 5 MG tablet Take 1 tablet (5 mg total) by mouth daily. 90 tablet 3   traMADol (ULTRAM) 50 MG tablet Take 1-2 tablets (50-100 mg total) by mouth 2 (two) times daily as needed. 90 tablet 0   Vitamin D, Ergocalciferol, (DRISDOL) 1.25 MG (50000 UNIT) CAPS capsule Take 50,000 Units by mouth once a week.     No facility-administered medications prior to visit.    Allergies  Allergen Reactions   Iodine Other (See Comments)    Shook violently and passed out.   Codeine Other (See Comments)    Hallucinations   Lipitor [Atorvastatin] Other (See Comments)    Myalgias    ROS Review of Systems    Objective:    Physical Exam Constitutional:      Appearance: Normal appearance. She is well-developed.  HENT:     Head: Normocephalic and atraumatic.  Cardiovascular:     Rate and Rhythm: Normal rate and regular rhythm.     Heart sounds: Normal heart sounds.  Pulmonary:     Effort: Pulmonary effort is normal.     Breath sounds: Normal breath sounds.  Skin:    General: Skin is warm and dry.  Neurological:     Mental Status: She is alert and oriented to person, place, and time.  Psychiatric:        Behavior: Behavior normal.    BP (!) 136/58   Pulse 67   Ht  5\' 2"  (1.575 m)   Wt 163 lb (73.9 kg)   SpO2 100%   BMI 29.81 kg/m  Wt Readings from Last 3 Encounters:  02/17/21 163 lb (73.9 kg)  01/06/21 162 lb 11.2 oz (73.8  kg)  12/23/20 160 lb (72.6 kg)     Health Maintenance Due  Topic Date Due   OPHTHALMOLOGY EXAM  Never done   COVID-19 Vaccine (4 - Booster for Pfizer series) 06/09/2020    There are no preventive care reminders to display for this patient.  Lab Results  Component Value Date   TSH 0.44 09/18/2020   Lab Results  Component Value Date   WBC 5.2 08/21/2019   HGB 11.9 08/21/2019   HCT 35.9 08/21/2019   MCV 90.0 08/21/2019   PLT 204 08/21/2019   Lab Results  Component Value Date   NA 144 09/24/2020   K 4.1 09/24/2020   CO2 27 09/24/2020   GLUCOSE 82 09/24/2020   BUN 21 09/24/2020   CREATININE 0.80 09/24/2020   BILITOT 0.8 09/24/2020   ALKPHOS 66 12/23/2017   AST 24 09/24/2020   ALT 16 09/24/2020   PROT 6.5 09/24/2020   ALBUMIN 3.7 12/23/2017   CALCIUM 9.6 09/24/2020   ANIONGAP 10 01/04/2018   Lab Results  Component Value Date   CHOL 130 09/24/2020   Lab Results  Component Value Date   HDL 76 09/24/2020   Lab Results  Component Value Date   LDLCALC 40 09/24/2020   Lab Results  Component Value Date   TRIG 62 09/24/2020   Lab Results  Component Value Date   CHOLHDL 1.7 09/24/2020   Lab Results  Component Value Date   HGBA1C 5.7 (A) 02/17/2021      Assessment & Plan:   Problem List Items Addressed This Visit       Cardiovascular and Mediastinum   Essential hypertension, benign    Well controlled. Continue current regimen. Follow up in  6 mo       Relevant Orders   BASIC METABOLIC PANEL WITH GFR     Endocrine   IFG (impaired fasting glucose) - Primary    A1C looks great at 5.7 today even though it is up a little bit from previous is still looks great.      Relevant Orders   POCT glycosylated hemoglobin (Hb A1C) (Completed)   BASIC METABOLIC PANEL WITH GFR     Other    Depression, major, single episode, mild (HCC)   Bilateral lower extremity edema    OK to try decreasing the Bumex down to half a tab daily.  If you start to retain fluid in your ankles then okay to go back up to a whole tab for couple of days and then try to go back down again.      Other Visit Diagnoses     Screening mammogram for breast cancer       Relevant Orders   MM 3D SCREEN BREAST BILATERAL   Post-menopausal       Relevant Orders   DG Bone Density   Other specified hypothyroidism       Vitamin D deficiency       Relevant Orders   VITAMIN D 25 Hydroxy (Vit-D Deficiency, Fractures)       No orders of the defined types were placed in this encounter.   Follow-up: Return in about 4 months (around 06/20/2021) for Hypertension and IFG.    Nani Gasser, MD

## 2021-02-17 NOTE — Assessment & Plan Note (Signed)
Well controlled. Continue current regimen. Follow up in  6 mo  

## 2021-02-17 NOTE — Assessment & Plan Note (Signed)
OK to try decreasing the Bumex down to half a tab daily.  If you start to retain fluid in your ankles then okay to go back up to a whole tab for couple of days and then try to go back down again. 

## 2021-02-18 ENCOUNTER — Other Ambulatory Visit: Payer: Self-pay | Admitting: Family Medicine

## 2021-02-18 LAB — VITAMIN D 25 HYDROXY (VIT D DEFICIENCY, FRACTURES): Vit D, 25-Hydroxy: 114 ng/mL — ABNORMAL HIGH (ref 30–100)

## 2021-02-18 LAB — BASIC METABOLIC PANEL WITH GFR
BUN: 21 mg/dL (ref 7–25)
CO2: 27 mmol/L (ref 20–32)
Calcium: 9.5 mg/dL (ref 8.6–10.4)
Chloride: 107 mmol/L (ref 98–110)
Creat: 0.72 mg/dL (ref 0.60–1.00)
Glucose, Bld: 87 mg/dL (ref 65–99)
Potassium: 4.1 mmol/L (ref 3.5–5.3)
Sodium: 142 mmol/L (ref 135–146)
eGFR: 86 mL/min/{1.73_m2} (ref >=60)

## 2021-02-18 NOTE — Progress Notes (Signed)
Hi Joann Maddox, your metabolic panel looks good.  Vitamin D looks fantastic.  You can start spacing your vitamin D capsule to every other week until you run out.  When you run out then we will switch you to just a daily over-the-counter vitamin D.

## 2021-03-05 ENCOUNTER — Other Ambulatory Visit: Payer: Medicare Other

## 2021-03-16 ENCOUNTER — Other Ambulatory Visit: Payer: Self-pay | Admitting: Family Medicine

## 2021-04-08 ENCOUNTER — Other Ambulatory Visit: Payer: Self-pay | Admitting: Family Medicine

## 2021-04-14 ENCOUNTER — Other Ambulatory Visit: Payer: Self-pay | Admitting: Family Medicine

## 2021-04-14 DIAGNOSIS — D649 Anemia, unspecified: Secondary | ICD-10-CM

## 2021-04-15 ENCOUNTER — Other Ambulatory Visit: Payer: Self-pay | Admitting: Family Medicine

## 2021-04-15 MED ORDER — GABAPENTIN 300 MG PO CAPS: 300.0000 mg | ORAL_CAPSULE | Freq: Three times a day (TID) | ORAL | 1 refills | Status: DC

## 2021-05-03 ENCOUNTER — Other Ambulatory Visit: Payer: Self-pay | Admitting: Family Medicine

## 2021-05-03 DIAGNOSIS — M19012 Primary osteoarthritis, left shoulder: Secondary | ICD-10-CM

## 2021-05-06 ENCOUNTER — Encounter: Payer: Self-pay | Admitting: Family Medicine

## 2021-05-06 ENCOUNTER — Ambulatory Visit (INDEPENDENT_AMBULATORY_CARE_PROVIDER_SITE_OTHER): Payer: Medicare Other | Admitting: Family Medicine

## 2021-05-06 ENCOUNTER — Other Ambulatory Visit: Payer: Self-pay

## 2021-05-06 VITALS — BP 139/75 | HR 61 | Ht 62.0 in | Wt 173.0 lb

## 2021-05-06 DIAGNOSIS — M542 Cervicalgia: Secondary | ICD-10-CM

## 2021-05-06 MED ORDER — PREDNISONE 20 MG PO TABS: 40.0000 mg | ORAL_TABLET | Freq: Every day | ORAL | 0 refills | Status: DC

## 2021-05-06 NOTE — Progress Notes (Signed)
Acute Office Visit  Subjective:    Patient ID: Joann Maddox, female    DOB: [birth year], 81 y.o.   MRN: [id number]  Chief Complaint  Patient presents with   Neck Pain    HPI Patient is in today for Neck Pain.  She says it started couple of weeks ago when she started sleeping in her chair in the living room nightly to keep the dogs from working.  She says it just been gradually getting worse its almost all completely on the right side of her neck and has now been triggering headaches that are radiating up to her right forehead.  She has used her heating pad a few times but not consistently.  She is not currently taking any extra medications she does normally take tramadol and has been taking that as usual.  Past Medical History:  Diagnosis Date   Anxiety    Arthritis    Depression    Headache(784.0)    migrains   Heart murmur    Hypothyroidism    Pre-diabetes    Rheumatoid arthritis(714.0)     Past Surgical History:  Procedure Laterality Date   ABDOMINAL HYSTERECTOMY     ANTERIOR CERVICAL DECOMP/DISCECTOMY FUSION N/A 01/03/2018   Procedure: ANTERIOR CERVICAL DECOMPRESSION/DISCECTOMY FUSION, ALLOGRAFT, PLATE;  Surgeon: Marybelle Killings, MD;  Location: [facility];  Service: Orthopedics;  Laterality: N/A;   APPENDECTOMY     CHOLECYSTECTOMY     REPLACEMENT TOTAL KNEE BILATERAL     TONSILLECTOMY     as  a child   TOTAL HIP ARTHROPLASTY     right and left    Family History  Problem Relation Age of Onset   Heart disease Father 65   Diabetes Other        aunt    Stroke Mother 24    Social History   Socioeconomic History   Marital status: Married    Spouse name: Sonia Side   Number of children: 2   Years of education: 16   Highest education level: Bachelor's degree (e.g., BA, AB, BS)  Occupational History   Occupation: Retired Pharmacist, hospital    Comment: Engineering geologist  Tobacco Use   Smoking status: Never   Smokeless tobacco: Never  Substance and Sexual Activity   Alcohol use: No    Drug use: Never   Sexual activity: Not on file  Other Topics Concern   Not on file  Social History Narrative   Lives with her husband. She has two sons. She enjoys reading.   Social Determinants of Health   Financial Resource Strain: Low Risk    Difficulty of Paying Living Expenses: Not hard at all  Food Insecurity: No Food Insecurity   Worried About Charity fundraiser in the Last Year: Never true   [place] in the Last Year: Never true  Transportation Needs: No Transportation Needs   Lack of Transportation (Medical): No   Lack of Transportation (Non-Medical): No  Physical Activity: Inactive   Days of Exercise per Week: 0 days   Minutes of Exercise per Session: 0 min  Stress: No Stress Concern Present   Feeling of Stress : Not at all  Social Connections: Socially Integrated   Frequency of Communication with Friends and Family: More than three times a week   Frequency of Social Gatherings with Friends and Family: Once a week   Attends Religious Services: More than 4 times per year   Active Member of [employer] or Organizations: Yes  Attends Music therapist: More than 4 times per year   Marital Status: Married  Human resources officer Violence: Not At Risk   Fear of Current or Ex-Partner: No   Emotionally Abused: No   Physically Abused: No   Sexually Abused: No    Outpatient Medications Prior to Visit  Medication Sig Dispense Refill   azelaic acid (AZELEX) 20 % cream APPLY TOPICALL 2 TIMES A DAY (MORNING AND EVENING). APPLY AFTER SKIN IS WASHED AND PATTED DRY. 30 g 6   bumetanide (BUMEX) 1 MG tablet Take 1 mg by mouth every morning.     diclofenac Sodium (VOLTAREN) 1 % GEL Apply topically.     FLUoxetine (PROZAC) 20 MG tablet Take 1 tablet (20 mg total) by mouth daily. 90 tablet 3   gabapentin (NEURONTIN) 300 MG capsule Take 1 capsule (300 mg total) by mouth 3 (three) times daily. 270 capsule 1   hydroxychloroquine (PLAQUENIL) 200 MG tablet TALE 2 TABLETS BY  MOUTH WITH FOOD OR MILK ONCE A DAY 60 tablet 6   losartan (COZAAR) 25 MG tablet TAKE 1 TABLET (25 MG TOTAL) BY MOUTH DAILY. 90 tablet 0   mirabegron ER (MYRBETRIQ) 50 MG TB24 tablet Take 1 tablet (50 mg total) by mouth daily. 90 tablet 1   Multiple Vitamin (MULTIVITAMIN) tablet Take 1 tablet by mouth daily.     nystatin (MYCOSTATIN/NYSTOP) powder Apply 1 gram to affected area 2 times daily 60 g 11   potassium chloride (KLOR-CON) 10 MEQ tablet Take 1 tablet (10 mEq total) by mouth 2 (two) times daily. 60 tablet 11   rosuvastatin (CRESTOR) 10 MG tablet Take 10 mg by mouth at bedtime.     traMADol (ULTRAM) 50 MG tablet TAKE 1-2 TABLETS (50-100 MG TOTAL) BY MOUTH 2 (TWO) TIMES DAILY AS NEEDED. 90 tablet 0   bumetanide (BUMEX) 0.5 MG tablet Take 0.5 tablets (0.25 mg total) by mouth in the morning.     rosuvastatin (CRESTOR) 5 MG tablet Take 1 tablet (5 mg total) by mouth daily. 90 tablet 3   No facility-administered medications prior to visit.    Allergies  Allergen Reactions   Iodine Other (See Comments)    Shook violently and passed out.   Codeine Other (See Comments)    Hallucinations   Lipitor [Atorvastatin] Other (See Comments)    Myalgias    Review of Systems     Objective:    Physical Exam Vitals reviewed.  Constitutional:      Appearance: She is well-developed.  HENT:     Head: Normocephalic and atraumatic.  Eyes:     Conjunctiva/sclera: Conjunctivae normal.  Cardiovascular:     Rate and Rhythm: Normal rate.  Pulmonary:     Effort: Pulmonary effort is normal.  Skin:    General: Skin is dry.     Coloration: Skin is not pale.     Comments: Normal flexion.  Decreased extension.  Decreased rotation right and left but fairly symmetric.  Decreased sidebending right and left but worse on the right compared to the left.  She is tender over the trapezius muscle.  Nontender over the sternocleidomastoid.  Nontender over the cervical spine.  Neurological:     Mental Status: She  is alert and oriented to person, place, and time.  Psychiatric:        Behavior: Behavior normal.    BP 139/75    Pulse 61    Ht 5' 2"  (1.575 m)    Wt 173 lb (78.5 kg)  SpO2 100%    BMI 31.64 kg/m  Wt Readings from Last 3 Encounters:  05/06/21 173 lb (78.5 kg)  02/17/21 163 lb (73.9 kg)  01/06/21 162 lb 11.2 oz (73.8 kg)    Health Maintenance Due  Topic Date Due   OPHTHALMOLOGY EXAM  Never done   Zoster Vaccines- Shingrix (1 of 2) Never done   COVID-19 Vaccine (4 - Booster for Pfizer series) 06/09/2020    There are no preventive care reminders to display for this patient.   Lab Results  Component Value Date   TSH 0.44 09/18/2020   Lab Results  Component Value Date   WBC 5.2 08/21/2019   HGB 11.9 08/21/2019   HCT 35.9 08/21/2019   MCV 90.0 08/21/2019   PLT 204 08/21/2019   Lab Results  Component Value Date   NA 142 02/17/2021   K 4.1 02/17/2021   CO2 27 02/17/2021   GLUCOSE 87 02/17/2021   BUN 21 02/17/2021   CREATININE 0.72 02/17/2021   BILITOT 0.8 09/24/2020   ALKPHOS 66 12/23/2017   AST 24 09/24/2020   ALT 16 09/24/2020   PROT 6.5 09/24/2020   ALBUMIN 3.7 12/23/2017   CALCIUM 9.5 02/17/2021   ANIONGAP 10 01/04/2018   EGFR 86 02/17/2021   Lab Results  Component Value Date   CHOL 130 09/24/2020   Lab Results  Component Value Date   HDL 76 09/24/2020   Lab Results  Component Value Date   LDLCALC 40 09/24/2020   Lab Results  Component Value Date   TRIG 62 09/24/2020   Lab Results  Component Value Date   CHOLHDL 1.7 09/24/2020   Lab Results  Component Value Date   HGBA1C 5.7 (A) 02/17/2021       Assessment & Plan:   Problem List Items Addressed This Visit   None Visit Diagnoses     Neck pain on right side    -  Primary      Right-sided neck pain triggering headaches-given handout for stretches to do on her own at home for cervical strain.  If not improving discussed referral for formal physical therapy.  Also has discussed a  trial of prednisone since it sounds like it is triggering headaches for her.  Okay to add Tylenol to her tramadol over-the-counter.  If not improving please let me know she is actually traveling to [facility] tomorrow and will be gone for 3 weeks visiting her son.  Also reports that she has been feeling a little bit more down over the last 2 weeks she says she normally is a pretty happy person and cannot quite figure out why she is feeling that way.  She says she thinks part of it is that she has gained some weight over the holidays.  She is work so hard to get a lot of that weight off and so just feels a little frustrated.  But we did discuss some strategies around getting back on track with food choices in her diet and getting moving again.  I think visiting family over the next 3 weeks will be pretty positive impact for her but if she is still feeling down the time she comes back I encouraged her to reach out and let me know.  Meds ordered this encounter  Medications   predniSONE (DELTASONE) 20 MG tablet    Sig: Take 2 tablets (40 mg total) by mouth daily with breakfast.    Dispense:  10 tablet    Refill:  0  Beatrice Lecher, MD

## 2021-05-20 ENCOUNTER — Telehealth: Payer: Self-pay | Admitting: Family Medicine

## 2021-05-20 DIAGNOSIS — M79671 Pain in right foot: Secondary | ICD-10-CM

## 2021-05-20 NOTE — Telephone Encounter (Signed)
Please call patient, as she is visiting her son out of state: Her son let us know that she just tested positive for COVID.  Bid I suspect she probably picked it up traveling.  She would be a candidate for Paxlovid as she is higher risk for complications.  See if she is wanting to consider the medication.

## 2021-05-21 MED ORDER — NIRMATRELVIR/RITONAVIR (PAXLOVID)TABLET: 3.0000 | ORAL_TABLET | Freq: Two times a day (BID) | ORAL | 0 refills | Status: AC

## 2021-05-21 NOTE — Telephone Encounter (Signed)
Joann Maddox states her symptoms really started on Saturday but Sunday the symptoms worsened. She did go to an urgent care and tested positive for Covid. They did not give her any medications. Pended pharmacy.

## 2021-06-16 ENCOUNTER — Other Ambulatory Visit: Payer: Self-pay | Admitting: Family Medicine

## 2021-06-18 NOTE — Telephone Encounter (Signed)
[facility]/Patient requesting med refill for bumetanide. Written by historical provider.

## 2021-06-24 ENCOUNTER — Encounter: Payer: Self-pay | Admitting: Family Medicine

## 2021-06-24 ENCOUNTER — Other Ambulatory Visit: Payer: Self-pay

## 2021-06-24 ENCOUNTER — Ambulatory Visit (INDEPENDENT_AMBULATORY_CARE_PROVIDER_SITE_OTHER): Payer: Medicare Other | Admitting: Family Medicine

## 2021-06-24 VITALS — BP 100/68 | HR 83 | Resp 18 | Ht 62.0 in | Wt 169.0 lb

## 2021-06-24 DIAGNOSIS — F411 Generalized anxiety disorder: Secondary | ICD-10-CM | POA: Diagnosis not present

## 2021-06-24 DIAGNOSIS — R7301 Impaired fasting glucose: Secondary | ICD-10-CM | POA: Diagnosis not present

## 2021-06-24 DIAGNOSIS — I10 Essential (primary) hypertension: Secondary | ICD-10-CM

## 2021-06-24 DIAGNOSIS — R413 Other amnesia: Secondary | ICD-10-CM

## 2021-06-24 DIAGNOSIS — F03A0 Unspecified dementia, mild, without behavioral disturbance, psychotic disturbance, mood disturbance, and anxiety: Secondary | ICD-10-CM | POA: Insufficient documentation

## 2021-06-24 DIAGNOSIS — R600 Localized edema: Secondary | ICD-10-CM | POA: Diagnosis not present

## 2021-06-24 MED ORDER — FUROSEMIDE 20 MG PO TABS: 20.0000 mg | ORAL_TABLET | Freq: Every day | ORAL | 0 refills | Status: DC | PRN

## 2021-06-24 NOTE — Assessment & Plan Note (Signed)
We will discontinue Bumex and try furosemide she had taken it previously.  I believe we had switched at 1 point because it was just no longer effective in controlling her swelling.  Her weight loss that she has been working on over the last year or more has really made a big difference in general and her swelling.  Social just use the medication as needed.

## 2021-06-24 NOTE — Patient Instructions (Signed)
Please hold your losartan completely until I see you back in 3 months.  Do not throw it away quite yet.  But I do want you to make sure you are not taking it and to take it out of your pillbox. Please schedule an eye exam when you can. Try the furosemide as needed for lower extremity swelling.  Try to keep track of how often you are actually using it and if it seems to be helpful.

## 2021-06-24 NOTE — Progress Notes (Signed)
Established Patient Office Visit  Subjective:  Patient ID: Joann Maddox, female    DOB: Sep 29, 1940  Age: 81 y.o. MRN: [id number]  CC:  Chief Complaint  Patient presents with   Hypertension    Follow up    Impaired Fasting Glucose     Follow up    Discuss Medications    Patient stated she stopped Bumex about 1-2 months. Patient stated she is unable to get to the bathroom when taking it. Patient would like to discuss other options.     Diabetes Eye Exam    Patient stated she will schedule eye exam     HPI Joann Maddox presents for   Hypertension- Pt denies chest pain, SOB, dizziness, or heart palpitations.  Taking meds as directed w/o problems.  Denies medication side effects.    Impaired fasting glucose-no increased thirst or urination. No symptoms consistent with hypoglycemia.  She also had COVID at the end of January.  She was treated with Paxlovid and says she was only sick for about a week and recovered quite quickly.  Bilateral lower extremity edema- Patient stated she stopped Bumex about 1-2 months. Patient stated she is unable to get to the bathroom when taking it. Patient would like to discuss other options  Husband notes that she is occasionally forgetful sometimes she will take pills out of her pillbox and set them aside because she is not sure what they are for.  Or sometimes will forget where she is placed her phone or her watch her cane.  He says he has to, keep tabs on it.  She has been really stressed as 2 of her brothers are very ill and she is worried about them.  But she denies really feeling down or depressed recently.  Past Medical History:  Diagnosis Date   Anxiety    Arthritis    Depression    Headache(784.0)    migrains   Heart murmur    Hypothyroidism    Pre-diabetes    Rheumatoid arthritis(714.0)     Past Surgical History:  Procedure Laterality Date   ABDOMINAL HYSTERECTOMY     ANTERIOR CERVICAL DECOMP/DISCECTOMY FUSION N/A 01/03/2018    Procedure: ANTERIOR CERVICAL DECOMPRESSION/DISCECTOMY FUSION, ALLOGRAFT, PLATE;  Surgeon: Marybelle Killings, MD;  Location: [facility];  Service: Orthopedics;  Laterality: N/A;   APPENDECTOMY     CHOLECYSTECTOMY     REPLACEMENT TOTAL KNEE BILATERAL     TONSILLECTOMY     as  a child   TOTAL HIP ARTHROPLASTY     right and left    Family History  Problem Relation Age of Onset   Heart disease Father 70   Diabetes Other        aunt    Stroke Mother 42    Social History   Socioeconomic History   Marital status: Married    Spouse name: Sonia Side   Number of children: 2   Years of education: 16   Highest education level: Bachelor's degree (e.g., BA, AB, BS)  Occupational History   Occupation: Retired Pharmacist, hospital    Comment: Engineering geologist  Tobacco Use   Smoking status: Never   Smokeless tobacco: Never  Substance and Sexual Activity   Alcohol use: No   Drug use: Never   Sexual activity: Not on file  Other Topics Concern   Not on file  Social History Narrative   Lives with her husband. She has two sons. She enjoys reading.   Social Determinants of Health  Financial Resource Strain: Low Risk    Difficulty of Paying Living Expenses: Not hard at all  Food Insecurity: No Food Insecurity   Worried About Charity fundraiser in the Last Year: Never true   Ran Out of Food in the Last Year: Never true  Transportation Needs: No Transportation Needs   Lack of Transportation (Medical): No   Lack of Transportation (Non-Medical): No  Physical Activity: Inactive   Days of Exercise per Week: 0 days   Minutes of Exercise per Session: 0 min  Stress: No Stress Concern Present   Feeling of Stress : Not at all  Social Connections: Socially Integrated   Frequency of Communication with Friends and Family: More than three times a week   Frequency of Social Gatherings with Friends and Family: Once a week   Attends Religious Services: More than 4 times per year   Active Member of [employer] or Organizations:  Yes   Attends Music therapist: More than 4 times per year   Marital Status: Married  Human resources officer Violence: Not At Risk   Fear of Current or Ex-Partner: No   Emotionally Abused: No   Physically Abused: No   Sexually Abused: No    Outpatient Medications Prior to Visit  Medication Sig Dispense Refill   azelaic acid (AZELEX) 20 % cream APPLY TOPICALL 2 TIMES A DAY (MORNING AND EVENING). APPLY AFTER SKIN IS WASHED AND PATTED DRY. 30 g 6   diclofenac Sodium (VOLTAREN) 1 % GEL Apply topically.     FLUoxetine (PROZAC) 20 MG tablet Take 1 tablet (20 mg total) by mouth daily. 90 tablet 3   gabapentin (NEURONTIN) 300 MG capsule Take 1 capsule (300 mg total) by mouth 3 (three) times daily. 270 capsule 1   hydroxychloroquine (PLAQUENIL) 200 MG tablet TALE 2 TABLETS BY MOUTH WITH FOOD OR MILK ONCE A DAY 60 tablet 6   losartan (COZAAR) 25 MG tablet TAKE 1 TABLET (25 MG TOTAL) BY MOUTH DAILY. 90 tablet 0   mirabegron ER (MYRBETRIQ) 50 MG TB24 tablet Take 1 tablet (50 mg total) by mouth daily. 90 tablet 1   Multiple Vitamin (MULTIVITAMIN) tablet Take 1 tablet by mouth daily.     nystatin (MYCOSTATIN/NYSTOP) powder Apply 1 gram to affected area 2 times daily 60 g 11   potassium chloride (KLOR-CON) 10 MEQ tablet Take 1 tablet (10 mEq total) by mouth 2 (two) times daily. 60 tablet 11   rosuvastatin (CRESTOR) 10 MG tablet Take 10 mg by mouth at bedtime.     traMADol (ULTRAM) 50 MG tablet TAKE 1-2 TABLETS (50-100 MG TOTAL) BY MOUTH 2 (TWO) TIMES DAILY AS NEEDED. 90 tablet 0   bumetanide (BUMEX) 1 MG tablet TAKE 1 TABLET (1 MG TOTAL) BY MOUTH IN THE MORNING. (Patient not taking: Reported on 06/24/2021) 90 tablet 3   No facility-administered medications prior to visit.    Allergies  Allergen Reactions   Iodine Other (See Comments)    Shook violently and passed out.   Codeine Other (See Comments)    Hallucinations   Lipitor [Atorvastatin] Other (See Comments)    Myalgias     ROS Review of Systems    Objective:    Physical Exam Constitutional:      Appearance: Normal appearance. She is well-developed.  HENT:     Head: Normocephalic and atraumatic.  Cardiovascular:     Rate and Rhythm: Normal rate and regular rhythm.     Heart sounds: Normal heart sounds.  Pulmonary:  Effort: Pulmonary effort is normal.     Breath sounds: Normal breath sounds.  Skin:    General: Skin is warm and dry.     Comments: 1+ pitting edema both lower extremities.  Neurological:     Mental Status: She is alert and oriented to person, place, and time.  Psychiatric:        Behavior: Behavior normal.    BP 100/68    Pulse 83    Resp 18    Ht 5' 2"  (1.575 m)    Wt 169 lb (76.7 kg)    SpO2 99%    BMI 30.91 kg/m  Wt Readings from Last 3 Encounters:  06/24/21 169 lb (76.7 kg)  05/06/21 173 lb (78.5 kg)  02/17/21 163 lb (73.9 kg)     Health Maintenance Due  Topic Date Due   OPHTHALMOLOGY EXAM  Never done    There are no preventive care reminders to display for this patient.  Lab Results  Component Value Date   TSH 0.44 09/18/2020   Lab Results  Component Value Date   WBC 5.2 08/21/2019   HGB 11.9 08/21/2019   HCT 35.9 08/21/2019   MCV 90.0 08/21/2019   PLT 204 08/21/2019   Lab Results  Component Value Date   NA 142 02/17/2021   K 4.1 02/17/2021   CO2 27 02/17/2021   GLUCOSE 87 02/17/2021   BUN 21 02/17/2021   CREATININE 0.72 02/17/2021   BILITOT 0.8 09/24/2020   ALKPHOS 66 12/23/2017   AST 24 09/24/2020   ALT 16 09/24/2020   PROT 6.5 09/24/2020   ALBUMIN 3.7 12/23/2017   CALCIUM 9.5 02/17/2021   ANIONGAP 10 01/04/2018   EGFR 86 02/17/2021   Lab Results  Component Value Date   CHOL 130 09/24/2020   Lab Results  Component Value Date   HDL 76 09/24/2020   Lab Results  Component Value Date   LDLCALC 40 09/24/2020   Lab Results  Component Value Date   TRIG 62 09/24/2020   Lab Results  Component Value Date   CHOLHDL 1.7 09/24/2020    Lab Results  Component Value Date   HGBA1C 5.7 (A) 02/17/2021      Assessment & Plan:   Problem List Items Addressed This Visit       Cardiovascular and Mediastinum   Essential hypertension, benign    Blood pressure little borderline low today.  But she is doing well.  She has not been even taking her diuretic for several weeks.  She is currently on 25 mg of losartan.  We will have her hold the losartan completely for the next 3 months until I see her back.      Relevant Medications   furosemide (LASIX) 20 MG tablet     Endocrine   IFG (impaired fasting glucose) - Primary    Due for A1c today.  Last 1 was 5.7 in October and looked great.      Relevant Orders   HgB A1c     Other   Memory change    MMSE score of 29/30 today.  Missed overlapping pentagons.   Will track again in 6-12 months.        GAD (generalized anxiety disorder)    2 of her older brothers are significantly older than her and they are not doing well and this is created a lot of stress for her.  She really finds that quiet time at night after the dogs are sleep and her husband is  asleep and that is when she wants to read it helps her decompress.  Offered to refer her to a therapist/counselor I still think it could be really helpful for her.      Bilateral lower extremity edema    We will discontinue Bumex and try furosemide she had taken it previously.  I believe we had switched at 1 point because it was just no longer effective in controlling her swelling.  Her weight loss that she has been working on over the last year or more has really made a big difference in general and her swelling.  Social just use the medication as needed.      Relevant Medications   furosemide (LASIX) 20 MG tablet    Meds ordered this encounter  Medications   furosemide (LASIX) 20 MG tablet    Sig: Take 1 tablet (20 mg total) by mouth daily as needed.    Dispense:  30 tablet    Refill:  0    Please get discontinue all  prescription refills for Bumex.    Follow-up: Return in about 3 months (around 09/21/2021) for Hypertension.   I spent 40 minutes on the day of the encounter to include pre-visit record review, face-to-face time with the patient and post visit ordering of test.   Beatrice Lecher, MD

## 2021-06-24 NOTE — Assessment & Plan Note (Addendum)
Blood pressure little borderline low today.  But she is doing well.  She has not been even taking her diuretic for several weeks.  She is currently on 25 mg of losartan.  We will have her hold the losartan completely for the next 3 months until I see her back.

## 2021-06-24 NOTE — Assessment & Plan Note (Signed)
Due for A1c today.  Last 1 was 5.7 in October and looked great.

## 2021-06-24 NOTE — Assessment & Plan Note (Signed)
2 of her older brothers are significantly older than her and they are not doing well and this is created a lot of stress for her.  She really finds that quiet time at night after the dogs are sleep and her husband is asleep and that is when she wants to read it helps her decompress.  Offered to refer her to a therapist/counselor I still think it could be really helpful for her.

## 2021-06-24 NOTE — Assessment & Plan Note (Signed)
MMSE score of 29/30 today.  Missed overlapping pentagons.   Will track again in 6-12 months.

## 2021-06-26 ENCOUNTER — Encounter: Payer: Self-pay | Admitting: Family Medicine

## 2021-06-26 NOTE — Telephone Encounter (Signed)
Orders Placed This Encounter  Procedures  . Ambulatory referral to Podiatry    Referral Priority:   Routine    Referral Type:   Consultation    Referral Reason:   Specialty Services Required    Requested Specialty:   Podiatry    Number of Visits Requested:   1    

## 2021-06-26 NOTE — Addendum Note (Signed)
Addended by: Nani Gasser D on: 06/26/2021 12:08 PM ? ? Modules accepted: Orders ? ?

## 2021-06-26 NOTE — Addendum Note (Signed)
Addended by: Chalmers Cater on: 06/26/2021 11:39 AM ? ? Modules accepted: Orders ? ?

## 2021-07-03 ENCOUNTER — Telehealth: Payer: Self-pay | Admitting: *Deleted

## 2021-07-03 NOTE — Telephone Encounter (Signed)
Pt's husband called and states that she's had a headache, he has tested her for COVID this was negative this morning, she feels like she has COVID, she has felt bad for the past 4 days, everything hurts, she's not sure. No loss of taste or smell.  ? ?She had covid when she was in [place] (mid-late January) she said it feels the same but worse.   ? ?Advised to drink plenty of fluids and take some tylenol for the headache and body aches.  ? ?Will fwd to PCP for advice ?

## 2021-07-03 NOTE — Telephone Encounter (Signed)
Reviewed message with Dr. Linford Arnold. Per Dr. Linford Arnold schedule patient appointment. Dr. Linford Arnold schedule is full. Scheduled patient with Christen Butter, NP on 07/04/21 at 11:10am. Patient advised.  ?

## 2021-07-04 ENCOUNTER — Other Ambulatory Visit: Payer: Self-pay

## 2021-07-04 ENCOUNTER — Ambulatory Visit (INDEPENDENT_AMBULATORY_CARE_PROVIDER_SITE_OTHER): Payer: Medicare Other | Admitting: Medical-Surgical

## 2021-07-04 ENCOUNTER — Encounter: Payer: Self-pay | Admitting: Medical-Surgical

## 2021-07-04 VITALS — BP 146/81 | HR 58 | Temp 97.7°F | Resp 20 | Ht 62.0 in | Wt 170.0 lb

## 2021-07-04 DIAGNOSIS — R519 Headache, unspecified: Secondary | ICD-10-CM | POA: Diagnosis not present

## 2021-07-04 DIAGNOSIS — R52 Pain, unspecified: Secondary | ICD-10-CM | POA: Diagnosis not present

## 2021-07-04 DIAGNOSIS — R5383 Other fatigue: Secondary | ICD-10-CM | POA: Diagnosis not present

## 2021-07-04 LAB — CBC WITH DIFFERENTIAL/PLATELET
Absolute Monocytes: 518 cells/uL (ref 200–950)
Basophils Absolute: 59 cells/uL (ref 0–200)
Basophils Relative: 1.1 %
Eosinophils Absolute: 308 cells/uL (ref 15–500)
Eosinophils Relative: 5.7 %
HCT: 38.5 % (ref 35.0–45.0)
Hemoglobin: 12.7 g/dL (ref 11.7–15.5)
Lymphs Abs: 1939 cells/uL (ref 850–3900)
MCH: 30 pg (ref 27.0–33.0)
MCHC: 33 g/dL (ref 32.0–36.0)
MCV: 91 fL (ref 80.0–100.0)
MPV: 9.8 fL (ref 7.5–12.5)
Monocytes Relative: 9.6 %
Neutro Abs: 2576 cells/uL (ref 1500–7800)
Neutrophils Relative %: 47.7 %
Platelets: 293 10*3/uL (ref 140–400)
RBC: 4.23 10*6/uL (ref 3.80–5.10)
RDW: 12.6 % (ref 11.0–15.0)
Total Lymphocyte: 35.9 %
WBC: 5.4 10*3/uL (ref 3.8–10.8)

## 2021-07-04 LAB — POCT INFLUENZA A/B
Influenza A, POC: NEGATIVE
Influenza B, POC: NEGATIVE

## 2021-07-04 LAB — COMPLETE METABOLIC PANEL WITH GFR
AG Ratio: 1.7 (calc) (ref 1.0–2.5)
ALT: 15 U/L (ref 6–29)
AST: 23 U/L (ref 10–35)
Albumin: 4.1 g/dL (ref 3.6–5.1)
Alkaline phosphatase (APISO): 83 U/L (ref 37–153)
BUN: 12 mg/dL (ref 7–25)
CO2: 28 mmol/L (ref 20–32)
Calcium: 9.8 mg/dL (ref 8.6–10.4)
Chloride: 106 mmol/L (ref 98–110)
Creat: 0.7 mg/dL (ref 0.60–1.00)
Globulin: 2.4 g/dL (calc) (ref 1.9–3.7)
Glucose, Bld: 76 mg/dL (ref 65–99)
Potassium: 4.9 mmol/L (ref 3.5–5.3)
Sodium: 143 mmol/L (ref 135–146)
Total Bilirubin: 0.5 mg/dL (ref 0.2–1.2)
Total Protein: 6.5 g/dL (ref 6.1–8.1)
eGFR: 88 mL/min/{1.73_m2} (ref >=60)

## 2021-07-04 NOTE — Progress Notes (Signed)
?  HPI with pertinent ROS:  ? ?CC: Headache/body aches x4 days ? ?HPI: ?Pleasant 81 year old female presenting today for evaluation of 4 days of headache, body ache, and fatigue.  Notes the body aches are generalized and her headache spans across the front of her head involving both temples.  She has no other symptoms to indicate an upper respiratory illness and denies fever, chills, chest pain, shortness of breath, and GI symptoms.  Has tried taking Tylenol 1 tablet but is unsure of the strength.  When she did take the Tylenol, it was helpful for her headache.  No other interventions attempted.  Admits to not drinking enough water on a daily basis. ? ?I reviewed the past medical history, family history, social history, surgical history, and allergies today and no changes were needed.  Please see the problem list section below in epic for further details. ? ? ?Physical exam:  ? ?General: Well Developed, well nourished, and in no acute distress.  ?Neuro: Alert and oriented x3, extra-ocular muscles intact, sensation grossly intact.  ?HEENT: Normocephalic, atraumatic.  ?Skin: Warm and dry. ?Cardiac: Regular rate and rhythm, no murmurs rubs or gallops, no lower extremity edema.  ?Respiratory: Clear to auscultation bilaterally. Not using accessory muscles, speaking in full sentences. ? ?Impression and Recommendations:   ? ?1. Body aches ?2. Acute intractable headache, unspecified headache type ?3. Fatigue, unspecified type ?Checking labs to evaluate for anemia, electrolyte imbalance, and altered kidney function.  POCT influenza negative.  COVID test sent to the lab.  Consider possible dehydration.  Recommend increasing water intake to help combat this.  Okay to use Tylenol every 6-8 hours as needed for body aches and headache. ?- Novel Coronavirus, NAA ([facility]) ?- POCT Influenza A/B ?- CBC with Differential/Platelet ?- COMPLETE METABOLIC PANEL WITH GFR ? ?Return if symptoms worsen or fail to  improve. ?___________________________________________ ?Thayer Ohm, DNP, APRN, FNP-BC ?Primary Care and Sports Medicine ?[name] ?

## 2021-07-05 ENCOUNTER — Other Ambulatory Visit: Payer: Self-pay | Admitting: Family Medicine

## 2021-07-05 DIAGNOSIS — M19012 Primary osteoarthritis, left shoulder: Secondary | ICD-10-CM

## 2021-07-06 LAB — NOVEL CORONAVIRUS, NAA: SARS-CoV-2, NAA: NOT DETECTED

## 2021-07-07 ENCOUNTER — Other Ambulatory Visit: Payer: Self-pay

## 2021-07-07 ENCOUNTER — Encounter: Payer: Self-pay | Admitting: Medical-Surgical

## 2021-07-07 MED ORDER — GABAPENTIN 300 MG PO CAPS: 300.0000 mg | ORAL_CAPSULE | Freq: Three times a day (TID) | ORAL | 1 refills | Status: DC

## 2021-07-11 ENCOUNTER — Ambulatory Visit (INDEPENDENT_AMBULATORY_CARE_PROVIDER_SITE_OTHER): Payer: Medicare Other | Admitting: Podiatry

## 2021-07-11 ENCOUNTER — Other Ambulatory Visit: Payer: Self-pay

## 2021-07-11 ENCOUNTER — Encounter: Payer: Self-pay | Admitting: Podiatry

## 2021-07-11 ENCOUNTER — Ambulatory Visit (INDEPENDENT_AMBULATORY_CARE_PROVIDER_SITE_OTHER): Payer: Medicare Other

## 2021-07-11 DIAGNOSIS — M205X1 Other deformities of toe(s) (acquired), right foot: Secondary | ICD-10-CM

## 2021-07-11 DIAGNOSIS — M79672 Pain in left foot: Secondary | ICD-10-CM | POA: Diagnosis not present

## 2021-07-11 DIAGNOSIS — M79671 Pain in right foot: Secondary | ICD-10-CM | POA: Diagnosis not present

## 2021-07-11 DIAGNOSIS — M205X2 Other deformities of toe(s) (acquired), left foot: Secondary | ICD-10-CM

## 2021-07-11 DIAGNOSIS — L84 Corns and callosities: Secondary | ICD-10-CM

## 2021-07-11 NOTE — Progress Notes (Signed)
?  Subjective:  ?Patient ID: Joann Maddox, female    DOB: 08/13/1940,   MRN: [id number] ? ?Chief Complaint  ?Patient presents with  ? Foot Pain  ?  Bilateral callus cause pain , Patient states this has been ongoing for years  ? ? ?81 y.o. female presents for of bilateral foot pain that has been going on for years. Relates calluses that are painful when pressure is applied Relates this has affected her walking and hurts more in certain shoes. Denies any treatment besides rest. Denies any other pedal complaints. Denies n/v/f/c.  ? ?Past Medical History:  ?Diagnosis Date  ? Anxiety   ? Arthritis   ? Depression   ? Headache(784.0)   ? migrains  ? Heart murmur   ? Hypothyroidism   ? Pre-diabetes   ? Rheumatoid arthritis(714.0)   ? ? ?Objective:  ?Physical Exam: ?Vascular: DP/PT pulses 2/4 bilateral. CFT <3 seconds. Normal hair growth on digits. No edema.  ?Skin. No lacerations or abrasions bilateral feet. Hyperkeratotic lesion noted sub first metatarsal bilateral and sub fifth on the right  ?Musculoskeletal: MMT 5/5 bilateral lower extremities in DF, PF, Inversion and Eversion. Deceased ROM in DF of ankle joint. Decreased ROM of the first MPJ bilateral with spurring noted on the left dorsal first MPJ. No pain to palpation.  ?Neurological: Sensation intact to light touch.  ? ?Assessment:  ? ?1. Hallux limitus of left foot   ?2. Hallux limitus, right   ?3. Corns and callosities   ? ? ? ?Plan:  ?Patient was evaluated and treated and all questions answered. ?X-rays reviewed and discussed with patient. No acute fractures or dislocations noted.  Degenerative changes noted at the first MPJ bilateral left with end stage arthritis. Spurring noted to calcaneus bilateral. ?-Xrays reviewed ?-Discussed hallux limitus and  treatement options; conservative and  Surgical management; risks, benefits, alternatives discussed. All patient's questions answered. ?-Recommend continue with good supportive shoes and inserts. Discussed stiff soled  shoes and use of carbon fiber foot plate.   ?-Discussed anti-inflammatories.  ?-Discussed corns and calluses with patient and treatment options.  ?-Hyperkeratotic tissue was debrided with chisel without incident.  As courtesy.  ?-Encouraged daily moisturizing ?-Discussed use of pumice stone ?-Advised good supportive shoes and inserts ?-Patient to return to office as needed or sooner if condition worsens. ? ? ? ? ? ?Louann Sjogren, DPM  ? ? ?

## 2021-07-22 ENCOUNTER — Other Ambulatory Visit: Payer: Self-pay | Admitting: Family Medicine

## 2021-07-22 DIAGNOSIS — R600 Localized edema: Secondary | ICD-10-CM

## 2021-08-05 ENCOUNTER — Other Ambulatory Visit: Payer: Self-pay

## 2021-08-05 DIAGNOSIS — L719 Rosacea, unspecified: Secondary | ICD-10-CM

## 2021-08-05 MED ORDER — NYSTATIN 100000 UNIT/GM EX POWD: CUTANEOUS | 11 refills | Status: DC

## 2021-08-06 ENCOUNTER — Other Ambulatory Visit: Payer: Self-pay

## 2021-08-06 DIAGNOSIS — L719 Rosacea, unspecified: Secondary | ICD-10-CM

## 2021-08-06 NOTE — Telephone Encounter (Signed)
Received fax from patient's pharmacy. Patient is requesting for Nystatin Ointment instead of powder. Forward to Dr. Linford Arnold.  ?

## 2021-08-07 MED ORDER — NYSTATIN 100000 UNIT/GM EX OINT: 1.0000 "application " | TOPICAL_OINTMENT | Freq: Two times a day (BID) | CUTANEOUS | 0 refills | Status: DC

## 2021-08-10 ENCOUNTER — Other Ambulatory Visit: Payer: Self-pay | Admitting: Family Medicine

## 2021-08-21 ENCOUNTER — Other Ambulatory Visit: Payer: Self-pay

## 2021-08-21 DIAGNOSIS — M21611 Bunion of right foot: Secondary | ICD-10-CM

## 2021-08-26 ENCOUNTER — Other Ambulatory Visit: Payer: Self-pay | Admitting: Family Medicine

## 2021-08-26 DIAGNOSIS — R600 Localized edema: Secondary | ICD-10-CM

## 2021-08-28 ENCOUNTER — Ambulatory Visit (INDEPENDENT_AMBULATORY_CARE_PROVIDER_SITE_OTHER): Payer: Medicare Other | Admitting: Podiatry

## 2021-08-28 ENCOUNTER — Encounter: Payer: Self-pay | Admitting: Podiatry

## 2021-08-28 DIAGNOSIS — M205X2 Other deformities of toe(s) (acquired), left foot: Secondary | ICD-10-CM | POA: Diagnosis not present

## 2021-08-28 DIAGNOSIS — M205X1 Other deformities of toe(s) (acquired), right foot: Secondary | ICD-10-CM | POA: Diagnosis not present

## 2021-08-28 DIAGNOSIS — L84 Corns and callosities: Secondary | ICD-10-CM | POA: Diagnosis not present

## 2021-08-28 MED ORDER — MELOXICAM 15 MG PO TABS: 15.0000 mg | ORAL_TABLET | Freq: Every day | ORAL | 0 refills | Status: DC

## 2021-08-28 NOTE — Progress Notes (Signed)
?  Subjective:  ?Patient ID: Joann Maddox, female    DOB: [birth year],   MRN: [id number] ? ?Chief Complaint  ?Patient presents with  ? Foot Pain  ?  Follow up bilateral foot pain   "My feet hurt more than they have. They are swelling a lot and hurts to walk on them"  ? ? ?81 y.o. female presents for follow-up of bilateral foot pain and more swelling in her legs and ankles. Also requesting to have callus trimmed. Relates she has been put on Lasix and swelling is improving.  Denies any other pedal complaints. Denies n/v/f/c.  ? ?Past Medical History:  ?Diagnosis Date  ? Anxiety   ? Arthritis   ? Depression   ? Headache(784.0)   ? migrains  ? Heart murmur   ? Hypothyroidism   ? Pre-diabetes   ? Rheumatoid arthritis(714.0)   ? ? ?Objective:  ?Physical Exam: ?Vascular: DP/PT pulses 2/4 bilateral. CFT <3 seconds. Normal hair growth on digits. No edema.  ?Skin. No lacerations or abrasions bilateral feet. Hyperkeratotic lesion noted sub first metatarsal bilateral and sub fifth on the right  ?Musculoskeletal: MMT 5/5 bilateral lower extremities in DF, PF, Inversion and Eversion. Deceased ROM in DF of ankle joint. Decreased ROM of the first MPJ bilateral with spurring noted on the left dorsal first MPJ. No pain to palpation.  ?Neurological: Sensation intact to light touch.  ? ?Assessment:  ? ?1. Hallux limitus of left foot   ?2. Hallux limitus, right   ?3. Corns and callosities   ? ? ? ?Plan:  ?Patient was evaluated and treated and all questions answered. ?X-rays reviewed and discussed with patient. No acute fractures or dislocations noted.  Degenerative changes noted at the first MPJ bilateral left with end stage arthritis. Spurring noted to calcaneus bilateral. ?-Xrays reviewed ?-Discussed hallux limitus and  treatement options; conservative and  Surgical management; risks, benefits, alternatives discussed. All patient's questions answered. ?-Recommend continue with good supportive shoes and inserts. Discussed stiff soled  shoes and use of carbon fiber foot plate.   ?-Meloxicam sent to pharmacy.  ?-Discussed compression stockings and elevating foot for swelling.  ?-Discussed corns and calluses with patient and treatment options.  ?-Hyperkeratotic tissue was debrided with chisel without incident.  As courtesy.  ?-Encouraged daily moisturizing ?-Discussed use of pumice stone ?-Advised good supportive shoes and inserts ?-Patient to return to office as needed or sooner if condition worsens. ? ? ? ? ? ?Louann Sjogren, DPM  ? ? ?

## 2021-09-01 ENCOUNTER — Telehealth: Payer: Self-pay | Admitting: General Practice

## 2021-09-01 NOTE — Telephone Encounter (Signed)
Transition Care Management Follow-up Telephone Call ?Date of discharge and from where: 08/31/21 from [place] ?How have you been since you were released from the hospital? She had a fall. She is doing a little better. ?Any questions or concerns? No ? ?Items Reviewed: ?Did the pt receive and understand the discharge instructions provided? Yes  ?Medications obtained and verified? Yes  ?Other? No  ?Any new allergies since your discharge? No  ?Dietary orders reviewed? No ?Do you have support at home? Yes  ? ?Home Care and Equipment/Supplies: ?Were home health services ordered? no ? ?Functional Questionnaire: (I = Independent and D = Dependent) ?ADLs:  I ? ?Bathing/Dressing- I ? ?Meal Prep- I ? ?Eating- I ? ?Maintaining continence- I ? ?Transferring/Ambulation- I ? ?Managing Meds- I ? ?Follow up appointments reviewed: ? ?PCP Hospital f/u appt confirmed? Yes  Scheduled to see Dr. Madilyn Fireman on 09/02/21 @ 1130. ?[facility] f/u appt confirmed? No   ?Are transportation arrangements needed? No  ?If their condition worsens, is the pt aware to call PCP or go to the Emergency Dept.? Yes ?Was the patient provided with contact information for the PCP's office or ED? Yes ?Was to pt encouraged to call back with questions or concerns? Yes  ?

## 2021-09-02 ENCOUNTER — Ambulatory Visit (INDEPENDENT_AMBULATORY_CARE_PROVIDER_SITE_OTHER): Payer: Medicare Other | Admitting: Family Medicine

## 2021-09-02 ENCOUNTER — Encounter: Payer: Self-pay | Admitting: Family Medicine

## 2021-09-02 VITALS — BP 106/57 | HR 68 | Resp 18 | Ht 62.0 in | Wt 178.0 lb

## 2021-09-02 DIAGNOSIS — S161XXA Strain of muscle, fascia and tendon at neck level, initial encounter: Secondary | ICD-10-CM | POA: Diagnosis not present

## 2021-09-02 DIAGNOSIS — M533 Sacrococcygeal disorders, not elsewhere classified: Secondary | ICD-10-CM | POA: Diagnosis not present

## 2021-09-02 DIAGNOSIS — R21 Rash and other nonspecific skin eruption: Secondary | ICD-10-CM | POA: Diagnosis not present

## 2021-09-02 DIAGNOSIS — W010XXA Fall on same level from slipping, tripping and stumbling without subsequent striking against object, initial encounter: Secondary | ICD-10-CM | POA: Diagnosis not present

## 2021-09-02 DIAGNOSIS — I872 Venous insufficiency (chronic) (peripheral): Secondary | ICD-10-CM

## 2021-09-02 MED ORDER — NYSTATIN 100000 UNIT/GM EX OINT: 1.0000 "application " | TOPICAL_OINTMENT | Freq: Two times a day (BID) | CUTANEOUS | 99 refills | Status: DC

## 2021-09-02 NOTE — Assessment & Plan Note (Signed)
She has been wearing her compression stockings. ?

## 2021-09-02 NOTE — Patient Instructions (Signed)
And ice the affected areas for 5 to 8 minutes 3-4 times a day. ?Increase your Lasix to 2 tabs daily for the next 2 days to try to pull some extra fluid off and see if that is helpful.  You can take an extra tab as needed.  Just keep track of how often you are doing that. ?Work on gentle stretches of all of your joints.  Avoid prolonged sitting especially with a tailbone injury. ?

## 2021-09-02 NOTE — Progress Notes (Signed)
? ?Acute Office Visit ? ?Subjective:  ? ?  ?Patient ID: Joann Maddox , female    DOB: 10/22/1940, 81 y.o.   MRN: [id number] ? ?Chief Complaint  ?Patient presents with  ? Fall  ?  Patient complains of tailbone pain, pain in elbows and knees. Patient has bruises around her ankles. Patient also has a scab on the left side of her scalp from fall.   ? ? ?HPI ?Patient is in today recent fall that occurred on May 7.  Tripped on a mat at church and fell.  She injured her low back and tailbone.  She does not member hitting her head.  For Patient complains of tailbone pain, pain in elbows and knees. Patient has bruises around her ankles. Patient also has a scab on the left side of her scalp from fall.  She has been using her rolling walker since the injury.  She had just been using her cane when she actually fell.  She is using topical Voltaren gel and her meloxicam for pain. ? ?Head CT performed showing just atrophy and chronic small vessel ischemic changes.  No acute abnormality.  Lumbar spine x-ray just showing degenerative changes. ? ?She is also had some problems getting the nystatin ointment filled at the pharmacy she says they keep filling the powder instead but she feels like the ointment is more helpful. ? ?She has noticed that she is more swollen around her feet and ankles she is up about 10 pounds.  She reports that she has been taking her furosemide daily. ? ?ROS ? ? ?   ?Objective:  ?  ?BP (!) 106/57   Pulse 68   Resp 18   Ht 5\' 2"  (1.575 m)   Wt 178 lb (80.7 kg)   SpO2 100%   BMI 32.56 kg/m?  ? ? ?Physical Exam ?Vitals reviewed.  ?Constitutional:   ?   Appearance: She is well-developed.  ?HENT:  ?   Head: Normocephalic and atraumatic.  ?Eyes:  ?   Conjunctiva/sclera: Conjunctivae normal.  ?Cardiovascular:  ?   Rate and Rhythm: Normal rate.  ?Pulmonary:  ?   Effort: Pulmonary effort is normal.  ?Musculoskeletal:  ?   Comments: She has significant bruising down the right lower lateral leg from the knee to her  ankle.  She also has some bruising behind the left knee and on her left thigh.  She has an abrasion and a laceration on the left side of her scalp.  She has a few abrasions on her right elbow.  Unable to visualize her left upper arm but she says she has a fair amount of bruising there as well.  ?Skin: ?   General: Skin is dry.  ?   Coloration: Skin is not pale.  ?Neurological:  ?   Mental Status: She is alert and oriented to person, place, and time.  ?Psychiatric:     ?   Behavior: Behavior normal.  ? ? ?No results found for any visits on 09/02/21. ? ? ?   ?Assessment & Plan:  ? ?Problem List Items Addressed This Visit   ? ?  ? Musculoskeletal and Integument  ? Venous stasis dermatitis of both lower extremities  ?  She has been wearing her compression stockings. ? ?  ?  ?  ? Other  ? Coccydynia  ? ?Other Visit Diagnoses   ? ? Strain of neck muscle, initial encounter    -  Primary  ? Rash      ?  Relevant Medications  ? nystatin ointment (MYCOSTATIN)  ? Fall on same level from slipping, tripping or stumbling, initial encounter      ? ?  ? ? ?Cervical strain-given handout on exercises to do on her own at home over the next couple of weeks.  Recommend icing frequently.  If not improving consider formal physical therapy. ? ?Coccydynia-she has a cushion at home with a cut out in the back but does not put any pressure on the area.  Just encouraged her to avoid prolonged sitting make sure that she is moving and stretching.  But it may take several weeks for this to improve. ? ?Multiple contusions on her lower legs, and left arm-continue to ice as needed. ? ?Lower extremity edema - ok to increase Lasix to 2 tabs daily for couple days to see if we can pull some of the fluid off.  If she needs to take 2 tabs occasionally she can I just want her to keep track of how often she is needing to do that. ? ?Rash-sent over prescription for nystatin cream which she prefers over the powder.  I will go ahead and just remove the powder  from her medication list for now.  But we did discuss that it she may be on auto refill so she may need to address that with the pharmacy. ? ?Meds ordered this encounter  ?Medications  ? nystatin ointment (MYCOSTATIN)  ?  Sig: Apply 1 application. topically 2 (two) times daily.  ?  Dispense:  30 g  ?  Refill:  PRN  ? ? ?No follow-ups on file. ? ?Nani Gasser, MD ? ? ?

## 2021-09-07 ENCOUNTER — Other Ambulatory Visit: Payer: Self-pay | Admitting: Family Medicine

## 2021-09-09 ENCOUNTER — Other Ambulatory Visit: Payer: Self-pay | Admitting: Family Medicine

## 2021-09-09 ENCOUNTER — Other Ambulatory Visit: Payer: Self-pay | Admitting: Podiatry

## 2021-09-09 DIAGNOSIS — R600 Localized edema: Secondary | ICD-10-CM

## 2021-09-09 DIAGNOSIS — M19012 Primary osteoarthritis, left shoulder: Secondary | ICD-10-CM

## 2021-09-12 NOTE — Telephone Encounter (Signed)
Please advise 

## 2021-09-17 ENCOUNTER — Encounter: Payer: Self-pay | Admitting: Family Medicine

## 2021-09-18 NOTE — Progress Notes (Signed)
Pt called re:  MyChart message that she previously sent.  Pt was responded to thru MyChart, but did not read message.  Pt verbally advised of Dr. Shelah LewandowskyMetheney's recommendations.  Pt expressed understanding and is agreeable.  Tiajuana Amass. Tiera Mensinger, CMA

## 2021-09-23 ENCOUNTER — Encounter: Payer: Self-pay | Admitting: Family Medicine

## 2021-09-23 ENCOUNTER — Ambulatory Visit (INDEPENDENT_AMBULATORY_CARE_PROVIDER_SITE_OTHER): Payer: Medicare Other | Admitting: Family Medicine

## 2021-09-23 VITALS — BP 133/51 | HR 65 | Resp 16 | Ht 62.0 in | Wt 177.0 lb

## 2021-09-23 DIAGNOSIS — M533 Sacrococcygeal disorders, not elsewhere classified: Secondary | ICD-10-CM | POA: Diagnosis not present

## 2021-09-23 DIAGNOSIS — F320 Major depressive disorder, single episode, mild: Secondary | ICD-10-CM | POA: Diagnosis not present

## 2021-09-23 DIAGNOSIS — R7301 Impaired fasting glucose: Secondary | ICD-10-CM

## 2021-09-23 DIAGNOSIS — I10 Essential (primary) hypertension: Secondary | ICD-10-CM

## 2021-09-23 DIAGNOSIS — I872 Venous insufficiency (chronic) (peripheral): Secondary | ICD-10-CM

## 2021-09-23 LAB — POCT GLYCOSYLATED HEMOGLOBIN (HGB A1C): Hemoglobin A1C: 5.5 % (ref 4.0–5.6)

## 2021-09-23 NOTE — Assessment & Plan Note (Signed)
Slight increase in her PHQ-9 with a score of 7 today and her GAD-7 score of 5.  We will continue current regimen and monitor.  Does feel like the pain has been keeping her from being as active and that has really increased her stress levels.

## 2021-09-23 NOTE — Assessment & Plan Note (Signed)
Alternate your furosemide.  20 mg, then 40 mg, then 20 mg, then 40 mg.

## 2021-09-23 NOTE — Patient Instructions (Addendum)
Alternate your furosemide.  20 mg, then 40 mg, then 20 mg, then 40 mg.  Lets try this for the next 3 to 4 weeks to see if you notice improvement in consistency with your swelling.  He wants to be able to travel out of state to see her son this summer.  Coccydynia-medical ahead and refer to the orthopedic office since at this point she feels like she is actually getting a little bit worse even though she has been trying to use her donut cushion.

## 2021-09-23 NOTE — Assessment & Plan Note (Signed)
Pressure looks great today.  Continue current regimen. 

## 2021-09-23 NOTE — Progress Notes (Unsigned)
Established Patient Office Visit  Subjective   Patient ID: Joann Maddox, female    DOB: 05/18/1940  Age: 81 y.o. MRN: [id number]  Chief Complaint  Patient presents with   Hypertension    Follow up    Foot Swelling    Bilateral swelling in feet for 3 weeks. Patient has been taking Lasix 40 mg as needed for swelling    Impaired Fasting Glucose     Follow up     HPI  Hypertension- Pt denies chest pain, SOB, dizziness, or heart palpitations.  Taking meds as directed w/o problems.  Denies medication side effects.    Follow-up tailbone fracture-its been almost a month and she still having significant pain.  The fall occurred on May 7.  She says she still was sitting on the doughnut cushion but the only time she really gets full pain relief is if she is laying down.  She feels like her pain is actually almost getting worse.  Bilateral swelling in feet for 3 weeks. Patient has been taking Lasix 40 mg as needed for swelling.  On average maybe twice a week and the rest of the week she is taking the 20 mg once a day.  She has been trying to keep them elevated and she has been wearing her compression stockings.  She does feel like when she takes the 40 mg she does get a reduction in her swelling for about a day.   {History (Optional):23778}  ROS    Objective:     BP (!) 133/51   Pulse 65   Resp 16   Ht 5\' 2"  (1.575 m)   Wt 177 lb (80.3 kg)   SpO2 99%   BMI 32.37 kg/m  {Vitals History (Optional):23777}  Physical Exam Vitals and nursing note reviewed.  Constitutional:      Appearance: She is well-developed.  HENT:     Head: Normocephalic and atraumatic.  Cardiovascular:     Rate and Rhythm: Normal rate and regular rhythm.     Heart sounds: Normal heart sounds.  Pulmonary:     Effort: Pulmonary effort is normal.     Breath sounds: Normal breath sounds.  Musculoskeletal:     Comments: Trace swelling in both lower extremities.  A little bit worse on the left compared to the  right today.  Skin:    General: Skin is warm and dry.  Neurological:     Mental Status: She is alert and oriented to person, place, and time.  Psychiatric:        Behavior: Behavior normal.     No results found for any visits on 09/23/21.  {Labs (Optional):23779}  The 10-year ASCVD risk score (Arnett DK, et al., 2019) is: 57.3%    Assessment & Plan:   Problem List Items Addressed This Visit       Cardiovascular and Mediastinum   Essential hypertension, benign    Pressure looks great today.  Continue current regimen.         Endocrine   IFG (impaired fasting glucose) - Primary    A1c looks great today at 5.5.  Continue current regimen.  Plan to recheck again in 6 months.       Relevant Orders   POCT HgB A1C     Other   Depression, major, single episode, mild (HCC)    Slight increase in her PHQ-9 with a score of 7 today and her GAD-7 score of 5.  We will continue current regimen and monitor.  Does feel like the pain has been keeping her from being as active and that has really increased her stress levels.       Coccydynia    PLan to refer to Ortho since she still having significant pain.       Relevant Orders   Ambulatory referral to Orthopedic Surgery      Return in about 3 months (around 12/24/2021).    Beatrice Lecher, MD

## 2021-09-23 NOTE — Assessment & Plan Note (Signed)
A1c looks great today at 5.5.  Continue current regimen.  Plan to recheck again in 6 months.

## 2021-09-23 NOTE — Assessment & Plan Note (Signed)
PLan to refer to Ortho since she still having significant pain.

## 2021-10-06 ENCOUNTER — Other Ambulatory Visit: Payer: Self-pay | Admitting: Family Medicine

## 2021-10-06 ENCOUNTER — Other Ambulatory Visit: Payer: Self-pay | Admitting: Podiatry

## 2021-10-06 DIAGNOSIS — M19012 Primary osteoarthritis, left shoulder: Secondary | ICD-10-CM

## 2021-10-07 NOTE — Telephone Encounter (Signed)
Please advise 

## 2021-10-16 ENCOUNTER — Encounter: Payer: Self-pay | Admitting: Family Medicine

## 2021-10-27 ENCOUNTER — Telehealth: Payer: Self-pay

## 2021-10-27 NOTE — Telephone Encounter (Signed)
Gree with above especially since we cannot get her in here to be evaluated.  That is definitely an unusually high blood pressure for her.

## 2021-10-27 NOTE — Telephone Encounter (Signed)
Dorene Sorrow states Joann Maddox's blood pressure is 170/70 and then a few minutes later it was 207/84. He does report she has chronic headaches and they have been worse this week.    They are out of town. I advised to take her to the ED.

## 2021-10-28 ENCOUNTER — Other Ambulatory Visit: Payer: Self-pay | Admitting: Family Medicine

## 2021-12-06 ENCOUNTER — Other Ambulatory Visit: Payer: Self-pay | Admitting: Family Medicine

## 2021-12-23 ENCOUNTER — Telehealth: Payer: Self-pay | Admitting: Family Medicine

## 2021-12-23 NOTE — Telephone Encounter (Signed)
Med Rec disc for Joann Maddox was dropped off by [facility].Disc was put in Dr. Shelah Lewandowsky box 8/29.

## 2021-12-25 ENCOUNTER — Ambulatory Visit (INDEPENDENT_AMBULATORY_CARE_PROVIDER_SITE_OTHER): Payer: Medicare Other | Admitting: Family Medicine

## 2021-12-25 ENCOUNTER — Encounter: Payer: Self-pay | Admitting: Family Medicine

## 2021-12-25 VITALS — BP 132/50 | HR 70 | Ht 62.0 in | Wt 184.0 lb

## 2021-12-25 DIAGNOSIS — R7301 Impaired fasting glucose: Secondary | ICD-10-CM

## 2021-12-25 DIAGNOSIS — M533 Sacrococcygeal disorders, not elsewhere classified: Secondary | ICD-10-CM

## 2021-12-25 DIAGNOSIS — R32 Unspecified urinary incontinence: Secondary | ICD-10-CM | POA: Insufficient documentation

## 2021-12-25 DIAGNOSIS — I10 Essential (primary) hypertension: Secondary | ICD-10-CM | POA: Diagnosis not present

## 2021-12-25 DIAGNOSIS — M542 Cervicalgia: Secondary | ICD-10-CM

## 2021-12-25 DIAGNOSIS — R21 Rash and other nonspecific skin eruption: Secondary | ICD-10-CM | POA: Diagnosis not present

## 2021-12-25 MED ORDER — MELOXICAM 15 MG PO TABS
15.0000 mg | ORAL_TABLET | Freq: Every day | ORAL | 2 refills | Status: DC | PRN
Start: 2021-12-25 — End: 2022-03-30

## 2021-12-25 MED ORDER — NYSTATIN 100000 UNIT/GM EX OINT: 1.0000 | TOPICAL_OINTMENT | Freq: Two times a day (BID) | CUTANEOUS | 99 refills | Status: DC

## 2021-12-25 MED ORDER — AZELEX 20 % EX CREA: TOPICAL_CREAM | CUTANEOUS | 6 refills | Status: AC

## 2021-12-25 MED ORDER — MIRABEGRON ER 50 MG PO TB24: 50.0000 mg | ORAL_TABLET | Freq: Every day | ORAL | 1 refills | Status: DC

## 2021-12-25 NOTE — Assessment & Plan Note (Signed)
We discussed referral to urology for further work-up I really do not feel like the Myrbetriq is actually helping but she was very hesitant to discontinue it yet.  So we can await her consultation with urology and let them decide.

## 2021-12-25 NOTE — Assessment & Plan Note (Signed)
Currently working with a [facility].  Unfortunately she tweaked her neck after recent fall.

## 2021-12-25 NOTE — Progress Notes (Signed)
Established Patient Office Visit  Subjective   Patient ID: Joann Maddox, female    DOB: 1940/06/10  Age: 81 y.o. MRN: [id number]  Chief Complaint  Patient presents with   Follow-up    HPI   She actually fell a couple nights ago and twisted her neck when she fell.  She is been seeing the chiropractor she says it does give her relief maybe for about a day not complete relief.  But it does help.  Hypertension- Pt denies chest pain, SOB, dizziness, or heart palpitations.  Taking meds as directed w/o problems.  Denies medication side effects.    Impaired fasting glucose-she has gained back a little bit of weight since she was last here about 7 pounds and she feels a little frustrated she is not sure why her weight would go up she is not eating differently but she has not been nearly as mobile.  And a lot of urinary incontinence.  She is wearing a depends at night with a pad and is still leaking onto the bed she is having to get up and change clothes and sheets in the middle the night.  She is taking Myrbetriq she says she usually takes it closer to bedtime.  She feels like days that she takes her Lasix the leaking is worse.     ROS    Objective:     BP (!) 132/50   Pulse 70   Ht 5\' 2"  (1.575 m)   Wt 184 lb (83.5 kg)   SpO2 98%   BMI 33.65 kg/m    Physical Exam Vitals and nursing note reviewed.  Constitutional:      Appearance: She is well-developed.  HENT:     Head: Normocephalic and atraumatic.  Cardiovascular:     Rate and Rhythm: Normal rate and regular rhythm.     Heart sounds: Normal heart sounds.  Pulmonary:     Effort: Pulmonary effort is normal.     Breath sounds: Normal breath sounds.  Skin:    General: Skin is warm and dry.  Neurological:     Mental Status: She is alert and oriented to person, place, and time.  Psychiatric:        Behavior: Behavior normal.      No results found for any visits on 12/25/21.    The 10-year ASCVD risk score  (Arnett DK, et al., 2019) is: 56.7%    Assessment & Plan:   Problem List Items Addressed This Visit       Cardiovascular and Mediastinum   Essential hypertension, benign    Pressure overall looks good today.        Endocrine   IFG (impaired fasting glucose) - Primary    1C looks phenomenal at 5.5.  Just encouraged her to make sure that she is choosing more fruits and vegetables and less carbs even though she is not eating large amounts.        Other   Urinary incontinence    We discussed referral to urology for further work-up I really do not feel like the Myrbetriq is actually helping but she was very hesitant to discontinue it yet.  So we can await her consultation with urology and let them decide.      Relevant Medications   mirabegron ER (MYRBETRIQ) 50 MG TB24 tablet   Other Relevant Orders   Ambulatory referral to Urology   Neck pain    Currently working with a chiropractor.  Unfortunately she tweaked her neck  after recent fall.      Coccydynia    We discussed referral to Ortho at last office visit because she was having difficulty healing.  It did gradually improve over the summer and so she never made the appointment.      Relevant Medications   meloxicam (MOBIC) 15 MG tablet   Other Visit Diagnoses     Rash       Relevant Medications   nystatin ointment (MYCOSTATIN)       Return in about 3 months (around 03/26/2022) for prediabetes.    Nani Gasser, MD

## 2021-12-25 NOTE — Assessment & Plan Note (Signed)
1C looks phenomenal at 5.5.  Just encouraged her to make sure that she is choosing more fruits and vegetables and less carbs even though she is not eating large amounts.

## 2021-12-25 NOTE — Assessment & Plan Note (Signed)
We discussed referral to Ortho at last office visit because she was having difficulty healing.  It did gradually improve over the summer and so she never made the appointment.

## 2021-12-25 NOTE — Assessment & Plan Note (Signed)
Pressure overall looks good today.

## 2022-01-05 ENCOUNTER — Other Ambulatory Visit: Payer: Self-pay | Admitting: Family Medicine

## 2022-01-05 DIAGNOSIS — M19012 Primary osteoarthritis, left shoulder: Secondary | ICD-10-CM

## 2022-01-07 ENCOUNTER — Ambulatory Visit (INDEPENDENT_AMBULATORY_CARE_PROVIDER_SITE_OTHER): Payer: Medicare Other | Admitting: Family Medicine

## 2022-01-07 VITALS — BP 143/45 | HR 66 | Ht 61.0 in | Wt 186.0 lb

## 2022-01-07 DIAGNOSIS — Z23 Encounter for immunization: Secondary | ICD-10-CM

## 2022-01-07 DIAGNOSIS — Z780 Asymptomatic menopausal state: Secondary | ICD-10-CM

## 2022-01-07 DIAGNOSIS — Z0000 Encounter for general adult medical examination without abnormal findings: Secondary | ICD-10-CM | POA: Diagnosis not present

## 2022-01-07 NOTE — Progress Notes (Signed)
MEDICARE ANNUAL WELLNESS VISIT  01/07/2022  Subjective:  Joann Maddox is a 81 y.o. female patient of Metheney, Barbarann Ehlers, MD who had a Medicare Annual Wellness Visit today. Roxann is Retired and lives with their spouse. she has 2 children. she reports that she is socially active and does interact with friends/family regularly. she is minimally physically active and enjoys reading.  Patient Care Team: Agapito Games, MD as PCP - General (Family Medicine)     01/07/2022   10:25 AM 01/06/2021   11:19 AM 01/19/2019   11:10 AM 05/31/2018   11:56 AM 01/03/2018    5:30 PM 01/03/2018    9:45 AM 12/23/2017    1:34 PM  Advanced Directives  Does Patient Have a Medical Advance Directive? No No No Yes No  No  Type of Theme park manager;Living will     Copy of Healthcare Power of Attorney in Chart?    Yes - validated most recent copy scanned in chart (See row information)     Would patient like information on creating a medical advance directive? No - Patient declined No - Patient declined No - Patient declined  No - Patient declined No - Patient declined Yes (MAU/Ambulatory/Procedural Areas - Information given)    Hospital Utilization Over the Past 12 Months: # of hospitalizations or ER visits: 2 # of surgeries: 1  Review of Systems    Patient reports that her overall health is better when compared to last year.  Review of Systems: History obtained from chart review and the patient  All other systems negative.  Pain Assessment Pain : 0-10 Pain Score: 9  Pain Type: Chronic pain Pain Location: Neck Pain Descriptors / Indicators: Aching, Constant Pain Onset: More than a month ago Pain Frequency: Constant Pain Relieving Factors: Rest and chriopractor.  Pain Relieving Factors: Rest and chriopractor.  Current Medications & Allergies (verified) Allergies as of 01/07/2022       Reactions   Iodine Other (See Comments)   Shook violently and passed out.    Codeine Other (See Comments)   Hallucinations   Lipitor [atorvastatin] Other (See Comments)   Myalgias        Medication List        Accurate as of January 07, 2022 11:00 AM. If you have any questions, ask your nurse or doctor.          Azelex 20 % cream Generic drug: azelaic acid APPLY TOPICALL 2 TIMES A DAY (MORNING AND EVENING). APPLY AFTER SKIN IS WASHED AND PATTED DRY.   diclofenac Sodium 1 % Gel Commonly known as: VOLTAREN Apply topically.   FLUoxetine 20 MG tablet Commonly known as: PROZAC TAKE 1 TABLET BY MOUTH EVERY DAY   furosemide 20 MG tablet Commonly known as: LASIX TAKE 1 TABLET BY MOUTH EVERY DAY AS NEEDED   gabapentin 300 MG capsule Commonly known as: NEURONTIN Take 1 capsule (300 mg total) by mouth 3 (three) times daily.   hydroxychloroquine 200 MG tablet Commonly known as: PLAQUENIL TALE 2 TABLETS BY MOUTH WITH FOOD OR MILK ONCE A DAY   losartan 25 MG tablet Commonly known as: COZAAR TAKE 1 TABLET (25 MG TOTAL) BY MOUTH DAILY.   meloxicam 15 MG tablet Commonly known as: MOBIC Take 1 tablet (15 mg total) by mouth daily as needed for pain.   mirabegron ER 50 MG Tb24 tablet Commonly known as: Myrbetriq Take 1 tablet (50 mg total) by mouth daily.   multivitamin  tablet Take 1 tablet by mouth daily.   nystatin ointment Commonly known as: MYCOSTATIN Apply 1 Application topically 2 (two) times daily.   potassium chloride 10 MEQ tablet Commonly known as: KLOR-CON TAKE 1 TABLET BY MOUTH 2 TIMES DAILY.   rosuvastatin 10 MG tablet Commonly known as: CRESTOR TAKE 1 TABLET BY MOUTH EVERYDAY AT BEDTIME   traMADol 50 MG tablet Commonly known as: ULTRAM TAKE 1 TO 2 TABLETS BY MOUTH 2 (TWO) TIMES DAILY AS NEEDED.   Vitamin D (Ergocalciferol) 1.25 MG (50000 UNIT) Caps capsule Commonly known as: DRISDOL TAKE 1 CAPSULE (50,000 UNITS TOTAL) BY MOUTH EVERY SUNDAY.        History (reviewed): Past Medical History:  Diagnosis Date    Allergy    Anxiety    Arthritis    Cataract    Depression    GERD (gastroesophageal reflux disease)    Headache(784.0)    migrains   Heart murmur    Hypothyroidism    Pre-diabetes    Rheumatoid arthritis(714.0)    Past Surgical History:  Procedure Laterality Date   ABDOMINAL HYSTERECTOMY     ANTERIOR CERVICAL DECOMP/DISCECTOMY FUSION N/A 01/03/2018   Procedure: ANTERIOR CERVICAL DECOMPRESSION/DISCECTOMY FUSION, ALLOGRAFT, PLATE;  Surgeon: Eldred MangesYates, Mark C, MD;  Location: [facility] OR;  Service: Orthopedics;  Laterality: N/A;   APPENDECTOMY     CHOLECYSTECTOMY     JOINT REPLACEMENT     Both hips and both knees   REPLACEMENT TOTAL KNEE BILATERAL     SPINE SURGERY     TONSILLECTOMY     as  a child   TOTAL HIP ARTHROPLASTY     right and left   Family History  Problem Relation Age of Onset   Heart disease Father [id number]   Diabetes Other        aunt    Stroke Mother [id number]   Vision loss Mother    Arthritis Brother    Birth defects Son    Social History   Socioeconomic History   Marital status: Married    Spouse name: Dorene SorrowJerry   Number of children: 2   Years of education: 16   Highest education level: Bachelor's degree (e.g., BA, AB, BS)  Occupational History   Occupation: Retired Runner, broadcasting/film/videoTeacher    Comment: [employer]  Tobacco Use   Smoking status: Never   Smokeless tobacco: Never  Substance and Sexual Activity   Alcohol use: Never   Drug use: Never   Sexual activity: Not Currently  Other Topics Concern   Not on file  Social History Narrative   Lives with her husband. She has two sons. She enjoys reading.   Social Determinants of Health   Financial Resource Strain: Low Risk  (01/07/2022)   Overall Financial Resource Strain (CARDIA)    Difficulty of Paying Living Expenses: Not hard at all  Food Insecurity: No Food Insecurity (01/07/2022)   Hunger Vital Sign    Worried About Running Out of Food in the Last Year: Never true    Ran Out of Food in the Last Year: Never true   Transportation Needs: No Transportation Needs (01/07/2022)   PRAPARE - Administrator, Civil ServiceTransportation    Lack of Transportation (Medical): No    Lack of Transportation (Non-Medical): No  Physical Activity: Inactive (01/07/2022)   Exercise Vital Sign    Days of Exercise per Week: 0 days    Minutes of Exercise per Session: 0 min  Stress: No Stress Concern Present (01/07/2022)   [employer] -  Occupational Stress Questionnaire    Feeling of Stress : Not at all  Social Connections: Socially Integrated (01/07/2022)   Social Connection and Isolation Panel [NHANES]    Frequency of Communication with Friends and Family: More than three times a week    Frequency of Social Gatherings with Friends and Family: Twice a week    Attends Religious Services: More than 4 times per year    Active Member of [employer] or Organizations: Yes    Attends Archivist Meetings: More than 4 times per year    Marital Status: Married    Activities of Daily Living    01/07/2022   10:27 AM 01/06/2022   10:56 AM  In your present state of health, do you have any difficulty performing the following activities:  Hearing?  0  Vision?  0  Difficulty concentrating or making decisions?  1  Walking or climbing stairs?  1  Dressing or bathing?  1  Doing errands, shopping?  1  Preparing Food and eating ?  Y  Using the Toilet?  N  In the past six months, have you accidently leaked urine? Y Y  Do you have problems with loss of bowel control?  N  Managing your Medications?  Y  Managing your Finances?  Y  Housekeeping or managing your Housekeeping?  Y    Patient Education/Literacy How often do you need to have someone help you when you read instructions, pamphlets, or other written materials from your doctor or pharmacy?: 1 - Never What is the last grade level you completed in school?: Bachelor's degree  Exercise Current Exercise Habits: The patient does not participate in regular exercise at present,  Exercise limited by: orthopedic condition(s) (neck pain)  Diet Patient reports consuming 3 meals a day and 1 snack(s) a day Patient reports that her primary diet is: Regular Patient reports that she does have regular access to food.   Depression Screen    01/07/2022   10:30 AM 09/23/2021    4:25 PM 07/04/2021   11:13 AM 06/24/2021    1:26 PM 02/17/2021    1:42 PM 12/23/2020    1:46 PM 09/24/2020    1:58 PM  PHQ 2/9 Scores  PHQ - 2 Score 0 2 1 1 2  0 0  PHQ- 9 Score  9  1 8  1   Exception Documentation     Patient refusal       Fall Risk    01/07/2022   10:30 AM 01/06/2022   10:56 AM 09/02/2021   11:25 AM 07/04/2021   11:13 AM 06/24/2021    1:26 PM  [place] in the past year? 1 1 1  0 0  Number falls in past yr: 1 1 0 0 0  Injury with Fall? 1 1 1  0 0  Risk for fall due to : History of fall(s);Impaired mobility  Impaired mobility No Fall Risks No Fall Risks  Follow up Falls evaluation completed;Education provided;Falls prevention discussed  Falls prevention discussed;Falls evaluation completed Falls evaluation completed Falls prevention discussed;Falls evaluation completed     Objective:   BP (!) 143/45 (BP Location: Right Arm, Patient Position: Sitting, Cuff Size: Large)   Pulse 66   Ht 5\' 1"  (1.549 m)   Wt 186 lb (84.4 kg)   SpO2 98%   BMI 35.14 kg/m   Last Weight  Most recent update: 01/07/2022 10:21 AM    Weight  84.4 kg (186 lb)  Body mass index is 35.14 kg/m.  Hearing/Vision  Adelaida did not have difficulty with hearing/understanding during the face-to-face interview Kamylah did not have difficulty with her vision during the face-to-face interview Reports that she has not had a formal eye exam by an eye care professional within the past year Reports that she has not had a formal hearing evaluation within the past year  Cognitive Function:    01/07/2022   10:34 AM 01/06/2021   11:30 AM  6CIT Screen  What Year? 0 points 0 points  What month? 0  points 0 points  What time? 0 points 0 points  Count back from 20 0 points 0 points  Months in reverse 0 points 0 points  Repeat phrase 2 points 0 points  Total Score 2 points 0 points    Normal Cognitive Function Screening: Yes (Normal:0-7, Significant for Dysfunction: >8)  Immunization & Health Maintenance Record Immunization History  Administered Date(s) Administered   Fluad Quad(high Dose 65+) 01/10/2019, 02/05/2020, 12/23/2020, 01/07/2022   Influenza Split 03/16/2007, 02/20/2010   Influenza, High Dose Seasonal PF 06/30/2017   Influenza,inj,Quad PF,6+ Mos 01/12/2012, 02/19/2014   Influenza-Unspecified 01/12/2012, 03/28/2016   Moderna Sars-Covid-2 Vaccination 04/14/2020   PFIZER(Purple Top)SARS-COV-2 Vaccination 05/26/2019, 06/23/2019   Pneumococcal Conjugate-13 11/29/2017   Pneumococcal Polysaccharide-23 05/05/2007, 04/27/2008, 02/21/2009   Tdap 01/11/2020   Zoster, Live 04/27/2010, 11/18/2010    Health Maintenance  Topic Date Due   Diabetic kidney evaluation - Urine ACR  01/08/2022 (Originally 12/15/2017)   COVID-19 Vaccine (4 - Pfizer series) 01/23/2022 (Originally 06/09/2020)   Zoster Vaccines- Shingrix (1 of 2) 04/08/2022 (Originally 06/25/1992)   Hepatitis C Screening  01/08/2023 (Originally 06/25/1958)   Diabetic kidney evaluation - GFR measurement  07/05/2022   TETANUS/TDAP  01/10/2030   Pneumonia Vaccine 23+ Years old  Completed   INFLUENZA VACCINE  Completed   DEXA SCAN  Completed   HPV VACCINES  Aged Out       Assessment  This is a routine wellness examination for [employer].  Health Maintenance: Due or Overdue There are no preventive care reminders to display for this patient.   Arnoldo Morale Tuel does not need a referral for Community Assistance: Care Management:   no Social Work:    no Prescription Assistance:  no Nutrition/Diabetes Education:  no   Plan:  Personalized Goals  Goals Addressed               This Visit's Progress     Patient  Stated (pt-stated)        Would like to loose about 40 lbs.        Personalized Health Maintenance & Screening Recommendations  Influenza vaccine Bone density scan Shingrix vaccine Urine ACR  Lung Cancer Screening Recommended: no (Low Dose CT Chest recommended if Age 32-80 years, 30 pack-year currently smoking OR have quit w/in past 15 years) Hepatitis C Screening recommended: yes HIV Screening recommended: no  Advanced Directives: Written information was not given per the patient's request.  Referrals & Orders Orders Placed This Encounter  Procedures   DEXAScan   Flu Vaccine QUAD High Dose(Fluad)    Follow-up Plan Follow-up with Agapito Games, MD as planned Schedule your shingles vaccine at the pharmacy.  Referral for bone density has been sent and you should get a phone call.  Medicare wellness visit in one year.  AVS Printed and given to the patient.   I have personally reviewed and noted the following in the patient's chart:   Medical  and social history Use of alcohol, tobacco or illicit drugs  Current medications and supplements Functional ability and status Nutritional status Physical activity Advanced directives List of other physicians Hospitalizations, surgeries, and ER visits in previous 12 months Vitals Screenings to include cognitive, depression, and falls Referrals and appointments  In addition, I have reviewed and discussed with patient certain preventive protocols, quality metrics, and best practice recommendations. A written personalized care plan for preventive services as well as general preventive health recommendations were provided to patient.     Tinnie Gens, RN BSN  01/07/2022

## 2022-01-07 NOTE — Patient Instructions (Signed)
MEDICARE ANNUAL WELLNESS VISIT Health Maintenance Summary and Written Plan of Care  Joann Maddox ,  Thank you for allowing me to perform your Medicare Annual Wellness Visit and for your ongoing commitment to your health.   Health Maintenance & Immunization History Health Maintenance  Topic Date Due   Diabetic kidney evaluation - Urine ACR  01/08/2022 (Originally 12/15/2017)   COVID-19 Vaccine (4 - Pfizer series) 01/23/2022 (Originally 06/09/2020)   Zoster Vaccines- Shingrix (1 of 2) 04/08/2022 (Originally 06[birth year]25/1992)   INFLUENZA VACCINE  07/26/2022 (Originally 11/25/2021)   Hepatitis C Screening  01/08/2023 (Originally 06/25/1958)   Diabetic kidney evaluation - GFR measurement  07/05/2022   TETANUS/TDAP  01/10/2030   Pneumonia Vaccine 7+ Years old  Completed   DEXA SCAN  Completed   HPV VACCINES  Aged Out   Immunization History  Administered Date(s) Administered   Fluad Quad(high Dose 65+) 01/10/2019, 02/05/2020, 12/23/2020   Influenza Split 03/16/2007, 02/20/2010   Influenza, High Dose Seasonal PF 06/30/2017   Influenza,inj,Quad PF,6+ Mos 01/12/2012, 02/19/2014   Influenza-Unspecified 01/12/2012, 03/28/2016   Moderna Sars-Covid-2 Vaccination 04/14/2020   PFIZER(Purple Top)SARS-COV-2 Vaccination 05/26/2019, 06/23/2019   Pneumococcal Conjugate-13 11/29/2017   Pneumococcal Polysaccharide-23 05/05/2007, 04/27/2008, 02/21/2009   Tdap 01/11/2020   Zoster, Live 04/27/2010, 11/18/2010    These are the patient goals that we discussed:  Goals Addressed               This Visit's Progress     Patient Stated (pt-stated)        Would like to loose about 40 lbs.          This is a list of Health Maintenance Items that are overdue or due now: Influenza vaccine Bone density scan Shingrix vaccine Urine ACR  Orders/Referrals Placed Today: Orders Placed This Encounter  Procedures   DEXAScan    Standing Status:   Future    Standing Expiration Date:   01/08/2023    Scheduling  Instructions:     Please call patient to schedule.    Order Specific Question:   Reason for exam:    Answer:   Post menopausal    Order Specific Question:   Preferred imaging location?    Answer:   [name]    (Contact our referral department at [phone] if you have not spoken with someone about your referral appointment within the next 5 days)    Follow-up Plan Follow-up with Agapito Games, MD as planned Schedule your shingles vaccine at the pharmacy.  Referral for bone density has been sent and you should get a phone call.  Medicare wellness visit in one year.  AVS Printed and given to the patient.      Health Maintenance, Female Adopting a healthy lifestyle and getting preventive care are important in promoting health and wellness. Ask your health care provider about: The right schedule for you to have regular tests and exams. Things you can do on your own to prevent diseases and keep yourself healthy. What should I know about diet, weight, and exercise? Eat a healthy diet  Eat a diet that includes plenty of vegetables, fruits, low-fat dairy products, and lean protein. Do not eat a lot of foods that are high in solid fats, added sugars, or sodium. Maintain a healthy weight Body mass index (BMI) is used to identify weight problems. It estimates body fat based on height and weight. Your health care provider can help determine your BMI and help you achieve or maintain a healthy weight. Get  regular exercise Get regular exercise. This is one of the most important things you can do for your health. Most adults should: Exercise for at least 150 minutes each week. The exercise should increase your heart rate and make you sweat (moderate-intensity exercise). Do strengthening exercises at least twice a week. This is in addition to the moderate-intensity exercise. Spend less time sitting. Even light physical activity can be beneficial. Watch cholesterol and  blood lipids Have your blood tested for lipids and cholesterol at 79 years of age, then have this test every 5 years. Have your cholesterol levels checked more often if: Your lipid or cholesterol levels are high. You are older than 79 years of age. You are at high risk for heart disease. What should I know about cancer screening? Depending on your health history and family history, you may need to have cancer screening at various ages. This may include screening for: Breast cancer. Cervical cancer. Colorectal cancer. Skin cancer. Lung cancer. What should I know about heart disease, diabetes, and high blood pressure? Blood pressure and heart disease High blood pressure causes heart disease and increases the risk of stroke. This is more likely to develop in people who have high blood pressure readings or are overweight. Have your blood pressure checked: Every 3-5 years if you are 95-59 years of age. Every year if you are 77 years old or older. Diabetes Have regular diabetes screenings. This checks your fasting blood sugar level. Have the screening done: Once every three years after age 8 if you are at a normal weight and have a low risk for diabetes. More often and at a younger age if you are overweight or have a high risk for diabetes. What should I know about preventing infection? Hepatitis B If you have a higher risk for hepatitis B, you should be screened for this virus. Talk with your health care provider to find out if you are at risk for hepatitis B infection. Hepatitis C Testing is recommended for: Everyone born from [birth year] through 1963. Anyone with known risk factors for hepatitis C. Sexually transmitted infections (STIs) Get screened for STIs, including gonorrhea and chlamydia, if: You are sexually active and are younger than 79 years of age. You are older than 79 years of age and your health care provider tells you that you are at risk for this type of infection. Your sexual  activity has changed since you were last screened, and you are at increased risk for chlamydia or gonorrhea. Ask your health care provider if you are at risk. Ask your health care provider about whether you are at high risk for HIV. Your health care provider may recommend a prescription medicine to help prevent HIV infection. If you choose to take medicine to prevent HIV, you should first get tested for HIV. You should then be tested every 3 months for as long as you are taking the medicine. Pregnancy If you are about to stop having your period (premenopausal) and you may become pregnant, seek counseling before you get pregnant. Take 400 to 800 micrograms (mcg) of folic acid every day if you become pregnant. Ask for birth control (contraception) if you want to prevent pregnancy. Osteoporosis and menopause Osteoporosis is a disease in which the bones lose minerals and strength with aging. This can result in bone fractures. If you are 48 years old or older, or if you are at risk for osteoporosis and fractures, ask your health care provider if you should: Be screened for bone loss.  Take a calcium or vitamin D supplement to lower your risk of fractures. Be given hormone replacement therapy (HRT) to treat symptoms of menopause. Follow these instructions at home: Alcohol use Do not drink alcohol if: Your health care provider tells you not to drink. You are pregnant, may be pregnant, or are planning to become pregnant. If you drink alcohol: Limit how much you have to: 0-1 drink a day. Know how much alcohol is in your drink. In the [place] one drink equals one 12 oz bottle of beer (355 mL), one 5 oz glass of wine (148 mL), or one 1 oz glass of hard liquor (44 mL). Lifestyle Do not use any products that contain nicotine or tobacco. These products include cigarettes, chewing tobacco, and vaping devices, such as e-cigarettes. If you need help quitting, ask your health care provider. Do not use street  drugs. Do not share needles. Ask your health care provider for help if you need support or information about quitting drugs. General instructions Schedule regular health, dental, and eye exams. Stay current with your vaccines. Tell your health care provider if: You often feel depressed. You have ever been abused or do not feel safe at home. Summary Adopting a healthy lifestyle and getting preventive care are important in promoting health and wellness. Follow your health care provider's instructions about healthy diet, exercising, and getting tested or screened for diseases. Follow your health care provider's instructions on monitoring your cholesterol and blood pressure. This information is not intended to replace advice given to you by your health care provider. Make sure you discuss any questions you have with your health care provider. Document Revised: 09/02/2020 Document Reviewed: 09/02/2020 Elsevier Patient Education  2023 [employer].

## 2022-01-09 ENCOUNTER — Ambulatory Visit (INDEPENDENT_AMBULATORY_CARE_PROVIDER_SITE_OTHER): Payer: Medicare Other | Admitting: Podiatry

## 2022-01-09 DIAGNOSIS — Z91199 Patient's noncompliance with other medical treatment and regimen due to unspecified reason: Secondary | ICD-10-CM

## 2022-01-09 NOTE — Progress Notes (Signed)
No show

## 2022-01-09 NOTE — Addendum Note (Signed)
Addended by: Louann Sjogren R on: 01/09/2022 01:49 PM   Modules accepted: Level of Service

## 2022-01-28 ENCOUNTER — Other Ambulatory Visit: Payer: Self-pay | Admitting: Family Medicine

## 2022-01-30 ENCOUNTER — Ambulatory Visit: Payer: Medicare Other | Admitting: Podiatrist

## 2022-02-05 ENCOUNTER — Ambulatory Visit (INDEPENDENT_AMBULATORY_CARE_PROVIDER_SITE_OTHER): Payer: Medicare Other | Admitting: Family Medicine

## 2022-02-05 VITALS — BP 126/56 | HR 70 | Ht 61.0 in | Wt 187.0 lb

## 2022-02-05 DIAGNOSIS — M542 Cervicalgia: Secondary | ICD-10-CM | POA: Diagnosis not present

## 2022-02-05 DIAGNOSIS — I10 Essential (primary) hypertension: Secondary | ICD-10-CM | POA: Diagnosis not present

## 2022-02-05 DIAGNOSIS — F320 Major depressive disorder, single episode, mild: Secondary | ICD-10-CM | POA: Diagnosis not present

## 2022-02-05 DIAGNOSIS — N3946 Mixed incontinence: Secondary | ICD-10-CM

## 2022-02-05 MED ORDER — GABAPENTIN 100 MG PO CAPS: ORAL_CAPSULE | ORAL | 0 refills | Status: DC

## 2022-02-05 MED ORDER — FLUOXETINE HCL 40 MG PO CAPS: 40.0000 mg | ORAL_CAPSULE | Freq: Every day | ORAL | 1 refills | Status: DC

## 2022-02-05 MED ORDER — GABAPENTIN 300 MG PO CAPS: 300.0000 mg | ORAL_CAPSULE | Freq: Every day | ORAL | 0 refills | Status: DC

## 2022-02-05 NOTE — Assessment & Plan Note (Signed)
She simply has a combination of depression and anxiety that she is battling right now with grief on top of that.  After much discussion today she agrees to maybe connect with a therapist/counselor I do feel that this will be extremely helpful for her.  We also discussed increasing the Prozac to 40 mg over the next 6 weeks to see if that is helpful as well.  In regards to excess sleeping, it certainly could be related to depression.  It may also be excessive sedation from her medications so he can also try to make a slight adjustment to decrease the gabapentin and work down on that medication if possible.

## 2022-02-05 NOTE — Patient Instructions (Signed)
Please take 200 mg of gabapentin in the morning, 200 mg mid afternoon, and your 300 mg capsule in the evening.  We will try this for a month or 2 and see if you feel like you are less sedated during the daytime.  Increased fluoxetine to 40 mg daily to see if this helps with mood.

## 2022-02-05 NOTE — Assessment & Plan Note (Signed)
Tried working with a chiropractor neck step would be either formal physical therapy or getting her in with sports medicine or Ortho.

## 2022-02-05 NOTE — Assessment & Plan Note (Signed)
Since the [facility] is only partially helpful encouraged her to keep her follow-up appoint with urology to discuss alternative options.  She may be at the point of needing some type of bladder tack

## 2022-02-05 NOTE — Assessment & Plan Note (Signed)
Well controlled. Continue current regimen. Follow up in  6 mo  

## 2022-02-05 NOTE — Progress Notes (Signed)
Established Patient Office Visit  Subjective   Patient ID: Joann Maddox, female    DOB: Apr 22, 1941  Age: 81 y.o. MRN: [id number]  Chief Complaint  Patient presents with   Fatigue   Depression    Pt c/o grief - has had a few family members pass away. ( Will be going to a funeral tomorrow)     HPI  She does have a couple of concerns going on.  She has had persistent headaches particularly on the right side of her head they seem to be triggering from the right side of her neck and then eventually end up going to her forehead and the side of her head.  They have been frequent.  Her husband is also concerned because she is sleeping a large amount of the day.  She just does not feel motivated to get up and do things she feels tired but she also reports feeling down and depressed.  She lost one of her brothers earlier this year and its been really struggling emotionally since then.  And now in the last couple of weeks a second brother and a stepsister also passed away in fact she is going to a funeral tomorrow.  Her husband is partly concerned because sometimes when she wakes up from the sleep she seems a little disoriented and disconnected and normally she is quite on top of it and with it.  Still struggling with her bladder.  She is currently on Myrbetriq 50 mg and it does help some but she is waking up at least 2-3 times a night either completely wet and soaked or almost making it to the bathroom but not quite.  She sleeps with her depends.  She does have a urology appointment coming up and so we will discuss further with them.    ROS    Objective:     BP (!) 126/56   Pulse 70   Ht 5\' 1"  (1.549 m)   Wt 187 lb (84.8 kg)   SpO2 98%   BMI 35.33 kg/m    Physical Exam Vitals and nursing note reviewed.  Constitutional:      Appearance: She is well-developed.  HENT:     Head: Normocephalic and atraumatic.  Cardiovascular:     Rate and Rhythm: Normal rate and regular rhythm.      Heart sounds: Normal heart sounds.  Pulmonary:     Effort: Pulmonary effort is normal.     Breath sounds: Normal breath sounds.  Skin:    General: Skin is warm and dry.  Neurological:     Mental Status: She is alert and oriented to person, place, and time.  Psychiatric:        Behavior: Behavior normal.      No results found for any visits on 02/05/22.    The 10-year ASCVD risk score (Arnett DK, et al., 2019) is: 53.3%    Assessment & Plan:   Problem List Items Addressed This Visit       Cardiovascular and Mediastinum   Essential hypertension, benign - Primary    Well controlled. Continue current regimen. Follow up in  6 mo         Other   Neck pain    Tried working with a chiropractor neck step would be either formal physical therapy or getting her in with sports medicine or Ortho.      Mixed stress and urge urinary incontinence    Since the [facility] is only partially helpful encouraged  her to keep her follow-up appoint with urology to discuss alternative options.  She may be at the point of needing some type of bladder tack      Depression, major, single episode, mild ([place])    She simply has a combination of depression and anxiety that she is battling right now with grief on top of that.  After much discussion today she agrees to maybe connect with a therapist/counselor I do feel that this will be extremely helpful for her.  We also discussed increasing the Prozac to 40 mg over the next 6 weeks to see if that is helpful as well.  In regards to excess sleeping, it certainly could be related to depression.  It may also be excessive sedation from her medications so he can also try to make a slight adjustment to decrease the gabapentin and work down on that medication if possible.      Relevant Medications   FLUoxetine (PROZAC) 40 MG capsule    Return in about 6 weeks (around 03/19/2022) for Mood and sedation .    Beatrice Lecher, MD

## 2022-02-06 ENCOUNTER — Ambulatory Visit: Payer: Medicare Other | Admitting: Podiatrist

## 2022-02-19 ENCOUNTER — Other Ambulatory Visit: Payer: Self-pay | Admitting: Family Medicine

## 2022-03-04 ENCOUNTER — Other Ambulatory Visit: Payer: Self-pay | Admitting: Family Medicine

## 2022-03-10 ENCOUNTER — Other Ambulatory Visit: Payer: Self-pay | Admitting: Family Medicine

## 2022-03-10 DIAGNOSIS — M19012 Primary osteoarthritis, left shoulder: Secondary | ICD-10-CM

## 2022-03-12 ENCOUNTER — Telehealth: Payer: Self-pay | Admitting: Neurology

## 2022-03-12 ENCOUNTER — Ambulatory Visit (INDEPENDENT_AMBULATORY_CARE_PROVIDER_SITE_OTHER): Payer: Medicare Other | Admitting: Family Medicine

## 2022-03-12 VITALS — BP 146/47 | HR 72 | Ht 61.0 in | Wt 184.0 lb

## 2022-03-12 DIAGNOSIS — R7301 Impaired fasting glucose: Secondary | ICD-10-CM | POA: Diagnosis not present

## 2022-03-12 DIAGNOSIS — H9193 Unspecified hearing loss, bilateral: Secondary | ICD-10-CM | POA: Insufficient documentation

## 2022-03-12 DIAGNOSIS — R2681 Unsteadiness on feet: Secondary | ICD-10-CM

## 2022-03-12 DIAGNOSIS — F320 Major depressive disorder, single episode, mild: Secondary | ICD-10-CM

## 2022-03-12 DIAGNOSIS — M25552 Pain in left hip: Secondary | ICD-10-CM | POA: Diagnosis not present

## 2022-03-12 DIAGNOSIS — I10 Essential (primary) hypertension: Secondary | ICD-10-CM

## 2022-03-12 LAB — POCT GLYCOSYLATED HEMOGLOBIN (HGB A1C): Hemoglobin A1C: 5.5 % (ref 4.0–5.6)

## 2022-03-12 NOTE — Assessment & Plan Note (Signed)
On her hearing screen here today she did poorly.  We will go ahead and place referral for hearing testing.

## 2022-03-12 NOTE — Assessment & Plan Note (Signed)
Pressure is up a little bit today we will keep an eye on it.

## 2022-03-12 NOTE — Assessment & Plan Note (Signed)
1C looks fantastic today continue current regimen.  Lab Results  Component Value Date   HGBA1C 5.5 03/12/2022

## 2022-03-12 NOTE — Assessment & Plan Note (Signed)
Place referral for behavioral health to schedule her for counseling/therapy she has suffered a lot of loss and grief in the last several years.

## 2022-03-12 NOTE — Telephone Encounter (Signed)
Patient was late for podiatry appt due to another medical visit running late and they would not let her reschedule.   Joann Maddox (or pass along to whoever is taking over) - Can you check to see if we can schedule this again for patient or if we need to send the referral elsewhere? Thanks so much.

## 2022-03-12 NOTE — Progress Notes (Signed)
Acute Office Visit  Subjective:     Patient ID: Joann Maddox, female    DOB: [birth year], 81 y.o.   MRN: [id number]  Chief Complaint  Patient presents with   Follow-up    HPI Patient is in today for couple of concerns.  Her husband is with her today.  Her husband notes that she does not seem to be hearing well.  Would like her to be evaluated further.  He also would like to see if they can get a home therapist to come and to help her with gait mobility and transitioning.  He is noticing that she is doing some things that are not helping with her mobility.  She is also interested in seeing a counselor/therapist for her depression and grief.  She also wanted to let me that for tonight she actually took 2 Myrbetrek for a total of 100 mg and says it actually helped her not have incontinence overnight.  She said she made her podiatry appointment but got there late because another appointment with another provider ran over and they said they would not reschedule her but she needs to get in for foot painful bunions.  ROS      Objective:    BP (!) 146/47   Pulse 72   Ht 5\' 1"  (1.549 m)   Wt 184 lb (83.5 kg)   SpO2 100%   BMI 34.77 kg/m    Physical Exam  Results for orders placed or performed in visit on 03/12/22  POCT glycosylated hemoglobin (Hb A1C)  Result Value Ref Range   Hemoglobin A1C 5.5 4.0 - 5.6 %   HbA1c POC (<> result, manual entry)     HbA1c, POC (prediabetic range)     HbA1c, POC (controlled diabetic range)          Assessment & Plan:   Problem List Items Addressed This Visit       Cardiovascular and Mediastinum   Essential hypertension, benign    Pressure is up a little bit today we will keep an eye on it.        Endocrine   IFG (impaired fasting glucose) - Primary    1C looks fantastic today continue current regimen.  Lab Results  Component Value Date   HGBA1C 5.5 03/12/2022        Relevant Orders   POCT glycosylated hemoglobin (Hb  A1C) (Completed)     Nervous and Auditory   Bilateral hearing loss    On her hearing screen here today she did poorly.  We will go ahead and place referral for hearing testing.      Relevant Orders   Ambulatory referral to Audiology     Other   Depression, major, single episode, mild (HCC)    Place referral for behavioral health to schedule her for counseling/therapy she has suffered a lot of loss and grief in the last several years.      Relevant Orders   Ambulatory referral to Behavioral Health   Other Visit Diagnoses     Left hip pain       Relevant Orders   Ambulatory referral to Home Health   Unsteady gait       Relevant Orders   Ambulatory referral to Home Health       Podiatry referral-we will check and see if they would be willing to give her another opportunity to make her appointment.  Encouraged her not to make other doctors appointments on the same day if at  all possible.  Home health referral placed for physical therapy for left hip pain and unsteady gait.  No orders of the defined types were placed in this encounter.   No follow-ups on file.  Nani Gasser, MD

## 2022-03-23 ENCOUNTER — Encounter: Payer: Self-pay | Admitting: Family Medicine

## 2022-03-23 ENCOUNTER — Ambulatory Visit (INDEPENDENT_AMBULATORY_CARE_PROVIDER_SITE_OTHER): Payer: Medicare Other | Admitting: Family Medicine

## 2022-03-23 VITALS — BP 155/80 | HR 57 | Ht 61.0 in | Wt 184.0 lb

## 2022-03-23 DIAGNOSIS — I10 Essential (primary) hypertension: Secondary | ICD-10-CM

## 2022-03-23 DIAGNOSIS — N3946 Mixed incontinence: Secondary | ICD-10-CM | POA: Diagnosis not present

## 2022-03-23 DIAGNOSIS — R2681 Unsteadiness on feet: Secondary | ICD-10-CM

## 2022-03-23 DIAGNOSIS — F320 Major depressive disorder, single episode, mild: Secondary | ICD-10-CM

## 2022-03-23 NOTE — Progress Notes (Signed)
   Established Patient Office Visit  Subjective   Patient ID: Joann Maddox, female    DOB: 1940/09/20  Age: 81 y.o. MRN: [id number]  Chief Complaint  Patient presents with   Follow-up         HPI  Here today for follow-up mostly on repeat blood pressure.    She is still not heard back yet about the referral for therapy/counseling but is interested in still doing that.  It looks of the referral was sent to [facility] counseling.  Also not heard back about from podiatry about getting her scheduled.  She had previously missed an appointment and we ran a call and see if they would be willing to get her back in.  She is a little concerned about her mood as we move into the holidays.  Sleep is still just been fair.  Is also planning on following up with GI soon.    ROS    Objective:     BP (!) 155/80   Pulse (!) 57   Ht 5\' 1"  (1.549 m)   Wt 184 lb (83.5 kg)   SpO2 100%   BMI 34.77 kg/m    Physical Exam Vitals reviewed.  Constitutional:      Appearance: She is well-developed.  HENT:     Head: Normocephalic and atraumatic.  Eyes:     Conjunctiva/sclera: Conjunctivae normal.  Cardiovascular:     Rate and Rhythm: Normal rate.  Pulmonary:     Effort: Pulmonary effort is normal.  Skin:    General: Skin is dry.     Coloration: Skin is not pale.  Neurological:     Mental Status: She is alert and oriented to person, place, and time.  Psychiatric:        Behavior: Behavior normal.      No results found for any visits on 03/23/22.    The 10-year ASCVD risk score (Arnett DK, et al., 2019) is: 68.6%    Assessment & Plan:   Problem List Items Addressed This Visit       Cardiovascular and Mediastinum   Essential hypertension, benign    Pressure still not at goal today but repeat was improved.  May need to bump the losartan to 50 mg.  And then follow-up in a couple of months.        Other   Unsteady gait    Starting PT/OT.      Mixed stress and urge  urinary incontinence    Still using the Myrbetrek.  Did verify that just the 50 mg is what is recommended.  Though,  theoretically could take two it just may cause some increased symptoms including dry mouth.      Depression, major, single episode, mild (HCC) - Primary    We will check on the therapy referral.  She has not heard back.       Return in about 2 months (around 05/23/2022) for bp,.    05/25/2022, MD

## 2022-03-26 ENCOUNTER — Ambulatory Visit: Payer: Medicare Other | Admitting: Family Medicine

## 2022-03-27 ENCOUNTER — Other Ambulatory Visit: Payer: Self-pay | Admitting: Family Medicine

## 2022-04-01 ENCOUNTER — Other Ambulatory Visit: Payer: Self-pay | Admitting: Family Medicine

## 2022-04-03 ENCOUNTER — Telehealth: Payer: Self-pay | Admitting: Family Medicine

## 2022-04-03 DIAGNOSIS — R2681 Unsteadiness on feet: Secondary | ICD-10-CM | POA: Insufficient documentation

## 2022-04-03 NOTE — Assessment & Plan Note (Signed)
Starting PT/OT.

## 2022-04-03 NOTE — Assessment & Plan Note (Signed)
Still using the [facility].  Did verify that just the 50 mg is what is recommended.  Though,  theoretically could take two it just may cause some increased symptoms including dry mouth.

## 2022-04-03 NOTE — Telephone Encounter (Signed)
Please see prior notes.  Can you check to see if [name] has reached out to her?  This is about the third time she has to about it.  Also I would put a note into [name] before she left about calling podiatry downstairs to see if they would be willing to reschedule her.  She was late for her appointment because she had another doctor's appointment and it ran over so she could not get there in time.  She is not chronically late and I do think she would be consistent in making another appointment on time if they would be willing to reschedule her downstairs.

## 2022-04-03 NOTE — Assessment & Plan Note (Signed)
Pressure still not at goal today but repeat was improved.  May need to bump the losartan to 50 mg.  And then follow-up in a couple of months.

## 2022-04-03 NOTE — Assessment & Plan Note (Signed)
We will check on the therapy referral.  She has not heard back.

## 2022-04-07 ENCOUNTER — Telehealth: Payer: Self-pay | Admitting: Podiatrist

## 2022-04-07 NOTE — Telephone Encounter (Signed)
Called [facility] and left voicemail for them to call me back and update me on the appt status for this patient. I also called [facility] and spoke with Dawn and she informed me she would call pt and get her scheduled again.

## 2022-04-07 NOTE — Telephone Encounter (Signed)
Okay, thank you so much.  Would you mind giving the patient or her husband a call just to give them an update.  This is twice they have come back in and have not heard back on the referrals so was just trying to get them in as soon as we can.

## 2022-04-07 NOTE — Telephone Encounter (Signed)
Received call from Dr Shelah Lewandowsky office and she was asking if we would see this pt again since she had cxled a few time.  Upon checking pt cxled the appt the day before so I told her we could see pt. I asked if she wanted me to call pt and they asked that I do.  I called pt to get scheduled and left message for pt to call to schedule an appt.

## 2022-04-09 NOTE — Telephone Encounter (Signed)
I called pt and left a VM for her and informed her of who I had called on her behalf and who I was able to speak to. I also left her my name and number to call if she needed more assistance and if she hasn't heard anything from those two offices.

## 2022-04-30 ENCOUNTER — Encounter: Payer: Self-pay | Admitting: Podiatry

## 2022-04-30 ENCOUNTER — Ambulatory Visit (INDEPENDENT_AMBULATORY_CARE_PROVIDER_SITE_OTHER): Payer: Medicare Other | Admitting: Podiatry

## 2022-04-30 DIAGNOSIS — M205X2 Other deformities of toe(s) (acquired), left foot: Secondary | ICD-10-CM

## 2022-04-30 DIAGNOSIS — M205X1 Other deformities of toe(s) (acquired), right foot: Secondary | ICD-10-CM

## 2022-04-30 DIAGNOSIS — L84 Corns and callosities: Secondary | ICD-10-CM

## 2022-04-30 NOTE — Progress Notes (Signed)
Subjective:  Patient ID: Joann Maddox, female    DOB: May 15, 1940,   MRN: [id number]  Chief Complaint  Patient presents with   Nail Problem     Bilateral great toe surgery consult    82 y.o. female presents for follow-up of bilateral foot pain and this time to discuss surgery. Relates the pain in both her feet has worsened but mostly in her left great toe. Relates most of the pain is in certain shoes and when walking. Relates the bump on her left is the most painful area.. Also requesting to have callus trimmed. Relates she has been put on Lasix and swelling is improving. 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 Does not recall specifics. History of RA for which she is on Plaquenil. Denies any other pedal complaints. Denies n/v/f/c.   Past Medical History:  Diagnosis Date   Allergy    Anxiety    Arthritis    Cataract    Depression    GERD (gastroesophageal reflux disease)    Headache(784.0)    migrains   Heart murmur    Hypothyroidism    Pre-diabetes    Rheumatoid arthritis(714.0)     Objective:  Physical Exam: Vascular: DP/PT pulses 2/4 bilateral. CFT <3 seconds. Normal hair growth on digits. No edema.  Skin. No lacerations or abrasions bilateral feet. Hyperkeratotic lesion noted sub first metatarsal bilateral and sub fifth on the right  Musculoskeletal: MMT 5/5 bilateral lower extremities in DF, PF, Inversion and Eversion. Deceased ROM in DF of ankle joint. Decreased ROM of the first MPJ bilateral with spurring noted on the left dorsal first MPJ. Joint essentially fused.  No pain to palpation.  Neurological: Sensation intact to light touch.   Assessment:   1. Hallux limitus of left foot   2. Corns and callosities   3. Hallux limitus, right       Plan:  Patient was evaluated and treated and all questions answered. X-rays reviewed and discussed with patient. No acute fractures or dislocations noted.  Degenerative changes noted at the first  MPJ bilateral left with end stage arthritis. Spurring noted to calcaneus bilateral. -Xrays reviewed -Discussed hallux limitus and  treatement options; conservative and  Surgical management; risks, benefits, alternatives discussed. All patient's questions answered. -Patient ready to consider surgery. Discussed with patient  options including cheilectomy vs first MPJ fusion. Discussed recovery and patient is more comfortable at this time proceeding with cheilectomy as biggest problem appears to be bump on foot and likely would not do well with a non-weightbearing recovery. Discussed because of the severity of her arthritis this may not relieve all of her pain but will likely improve some of it. Discussed with husband as well and in agreement with the plan for a cheilectomy.  -Recommend continue with good supportive shoes and inserts.  -Discussed compression stockings and elevating foot for swelling.  -Discussed corns and calluses with patient and treatment options.  -Hyperkeratotic tissue was debrided with chisel without incident.  As courtesy.  -Encouraged daily moisturizing -Discussed use of pumice stone -Advised good supportive shoes and inserts -Informed surgical risk consent was reviewed and read aloud to the patient.  I reviewed the films.  I have discussed my findings with the patient in great detail.  I have discussed all risks including but not limited to infection, stiffness, scarring, limp, disability, deformity, damage to blood vessels and nerves, numbness, poor healing, need for braces, arthritis, chronic pain, amputation, death.  All benefits and realistic  expectations discussed in great detail.  I have made no promises as to the outcome.  I have provided realistic expectations.  I have offered the patient a 2nd opinion, which they have declined and assured me they preferred to proceed despite the risks. -History of DVT. Plan for ASA BID post-operatively for prophylaxis.  -Will continue  plaquenil, but keflex post-operatively for prophylaxis  -Tentatively scheduled for January 16th for left great toe cheilectomy.        Lorenda Peck, DPM

## 2022-05-05 ENCOUNTER — Telehealth: Payer: Self-pay | Admitting: Podiatry

## 2022-05-05 NOTE — Telephone Encounter (Signed)
DOS: 05/12/2022  [employer] Medicare Effective 04/27/2022 [facility]  Cheilectomy Lt (07622)  Deductible: $800 with $705 remaining Out-of-Pocket: $1,700 with $1,595 remaining CoInsurance: 0%  Prior authorization is not required per [place] [facility] Call Reference #: [id number]

## 2022-05-05 NOTE — Telephone Encounter (Signed)
Called and spoke to patients husband and got the phone number to call to see if prior authorization is required for patients upcoming surgery. We do not have a copy of her new insurance card on file yet which is why I contacted the patient.

## 2022-05-06 ENCOUNTER — Other Ambulatory Visit: Payer: Self-pay | Admitting: Family Medicine

## 2022-05-06 DIAGNOSIS — M19012 Primary osteoarthritis, left shoulder: Secondary | ICD-10-CM

## 2022-05-12 ENCOUNTER — Encounter: Payer: Self-pay | Admitting: Podiatry

## 2022-05-12 ENCOUNTER — Other Ambulatory Visit: Payer: Self-pay | Admitting: Podiatry

## 2022-05-12 DIAGNOSIS — M205X2 Other deformities of toe(s) (acquired), left foot: Secondary | ICD-10-CM

## 2022-05-12 MED ORDER — ASPIRIN 81 MG PO TBEC: 81.0000 mg | DELAYED_RELEASE_TABLET | Freq: Two times a day (BID) | ORAL | 0 refills | Status: DC

## 2022-05-12 MED ORDER — CEPHALEXIN 500 MG PO CAPS: 500.0000 mg | ORAL_CAPSULE | Freq: Three times a day (TID) | ORAL | 0 refills | Status: AC

## 2022-05-12 MED ORDER — OXYCODONE-ACETAMINOPHEN 5-325 MG PO TABS: 1.0000 | ORAL_TABLET | ORAL | 0 refills | Status: AC | PRN

## 2022-05-18 ENCOUNTER — Other Ambulatory Visit: Payer: Self-pay | Admitting: Family Medicine

## 2022-05-18 DIAGNOSIS — M19012 Primary osteoarthritis, left shoulder: Secondary | ICD-10-CM

## 2022-05-21 ENCOUNTER — Other Ambulatory Visit: Payer: Self-pay | Admitting: Podiatry

## 2022-05-21 ENCOUNTER — Ambulatory Visit (INDEPENDENT_AMBULATORY_CARE_PROVIDER_SITE_OTHER): Payer: Medicare Other

## 2022-05-21 ENCOUNTER — Ambulatory Visit (INDEPENDENT_AMBULATORY_CARE_PROVIDER_SITE_OTHER): Payer: Medicare Other | Admitting: Podiatry

## 2022-05-21 DIAGNOSIS — Z09 Encounter for follow-up examination after completed treatment for conditions other than malignant neoplasm: Secondary | ICD-10-CM | POA: Diagnosis not present

## 2022-05-21 DIAGNOSIS — M79671 Pain in right foot: Secondary | ICD-10-CM | POA: Diagnosis not present

## 2022-05-21 DIAGNOSIS — S92911A Unspecified fracture of right toe(s), initial encounter for closed fracture: Secondary | ICD-10-CM

## 2022-05-21 DIAGNOSIS — M205X2 Other deformities of toe(s) (acquired), left foot: Secondary | ICD-10-CM

## 2022-05-21 DIAGNOSIS — S99921A Unspecified injury of right foot, initial encounter: Secondary | ICD-10-CM

## 2022-05-21 NOTE — Progress Notes (Signed)
Subjective:  Patient ID: Joann Maddox, female    DOB: 22-Jun-1940,  MRN: [id number]  Chief Complaint  Patient presents with   Routine Post Op    POV #1 DOS 05/12/2022 LT FOOT CHEILECTOMY    DOS: 05/12/21  Procedure: Left foot chielectomy   82 y.o. female returns for POV#1. Relates her left foot is doing well and not having too much pain.   The evening prior to surgery the patient had fallen out of her chair and injured her left foot. In the OR we were able to take X-rays on the right foot that showed a fracture of the first metatarsal. We have had her in a boot and NWB to this leg.   Review of Systems: Negative except as noted in the HPI. Denies N/V/F/Ch.  Past Medical History:  Diagnosis Date   Allergy    Anxiety    Arthritis    Cataract    Depression    GERD (gastroesophageal reflux disease)    Headache(784.0)    migrains   Heart murmur    Hypothyroidism    Pre-diabetes    Rheumatoid arthritis(714.0)     Current Outpatient Medications:    aspirin EC 81 MG tablet, Take 1 tablet (81 mg total) by mouth in the morning and at bedtime. Swallow whole., Disp: 60 tablet, Rfl: 0   azelaic acid (AZELEX) 20 % cream, APPLY TOPICALL 2 TIMES A DAY (MORNING AND EVENING). APPLY AFTER SKIN IS WASHED AND PATTED DRY., Disp: 30 g, Rfl: 6   cyanocobalamin (VITAMIN B12) 1000 MCG tablet, Take by mouth., Disp: , Rfl:    diclofenac Sodium (VOLTAREN) 1 % GEL, Apply topically., Disp: , Rfl:    FLUoxetine (PROZAC) 40 MG capsule, Take 1 capsule (40 mg total) by mouth daily., Disp: 90 capsule, Rfl: 1   gabapentin (NEURONTIN) 100 MG capsule, Take 2 capsules by mouth in the morning and mid afternoon. Separate prescription for 300 mg capsule at bedtime., Disp: 360 capsule, Rfl: 0   hydroxychloroquine (PLAQUENIL) 200 MG tablet, TALE 2 TABLETS BY MOUTH WITH FOOD OR MILK ONCE A DAY, Disp: 180 tablet, Rfl: 2   losartan (COZAAR) 25 MG tablet, TAKE 1 TABLET (25 MG TOTAL) BY MOUTH DAILY., Disp: 90 tablet, Rfl: 1    meloxicam (MOBIC) 15 MG tablet, TAKE 1 TABLET BY MOUTH EVERY DAY AS NEEDED FOR PAIN, Disp: 30 tablet, Rfl: 2   mirabegron ER (MYRBETRIQ) 50 MG TB24 tablet, Take 1 tablet (50 mg total) by mouth daily., Disp: 90 tablet, Rfl: 1   Multiple Vitamin (MULTIVITAMIN) tablet, Take 1 tablet by mouth daily., Disp: , Rfl:    nystatin ointment (MYCOSTATIN), Apply 1 Application topically 2 (two) times daily., Disp: 30 g, Rfl: PRN   potassium chloride (KLOR-CON) 10 MEQ tablet, TAKE 1 TABLET BY MOUTH 2 TIMES DAILY., Disp: 60 tablet, Rfl: 11   rosuvastatin (CRESTOR) 10 MG tablet, TAKE 1 TABLET BY MOUTH EVERYDAY AT BEDTIME, Disp: 90 tablet, Rfl: 3   senna-docusate (SENOKOT-S) 8.6-50 MG tablet, Take by mouth., Disp: , Rfl:    traMADol (ULTRAM) 50 MG tablet, TAKE 1 TO 2 TABLETS BY MOUTH 2 (TWO) TIMES DAILY AS NEEDED., Disp: 90 tablet, Rfl: 0   Vitamin D, Ergocalciferol, (DRISDOL) 1.25 MG (50000 UNIT) CAPS capsule, TAKE 1 CAPSULE (50,000 UNITS TOTAL) BY MOUTH EVERY SUNDAY., Disp: 12 capsule, Rfl: 3  Social History   Tobacco Use  Smoking Status Never  Smokeless Tobacco Never    Allergies  Allergen Reactions   Iodine Other (  See Comments)    Shook violently and passed out.   Codeine Other (See Comments)    Hallucinations   Lipitor [Atorvastatin] Other (See Comments)    Myalgias   Objective:  There were no vitals filed for this visit. There is no height or weight on file to calculate BMI. Constitutional Well developed. Well nourished.  Vascular Foot warm and well perfused. Capillary refill normal to all digits.   Neurologic Normal speech. Oriented to person, place, and time. Epicritic sensation to light touch grossly present bilaterally.  Dermatologic Skin healing well without signs of infection. Skin edges well coapted without signs of infection.  Orthopedic: Tenderness to palpation noted about the surgical site.   Radiographs: Left foot interval resection of first MPJ dorsal head and osteophytes.   On Right appears to be acute fracture noted to proximal phalanx of the third digit and avulsion fractures of the bases of the fourth and fifth proximal phalanxes.  Assessment:   1. Postoperative examination   2. Hallux limitus of left foot   3. Closed fracture multiple phalanges, toe, right, initial encounter    Plan:  Patient was evaluated and treated and all questions answered.  S/p foot surgery left -Progressing as expected post-operatively. -XR: Interval resction of first MPJ osteophytes  -WB Status: Heel WB in surgical shoe -Sutures: intact. -Medications: n/a -Foot redressed. Follow-up in 2 weeks    Right foot  -Xrays reviewed -Discussed treatement options for fracture; risks, alternatives, and benefits explained. -Continue with CAM boot and minimal weight bearing.  -Recommend protection, rest, ice, elevation daily until symptoms improve -Rx pain med/antinflammatories as needed    Return in about 2 weeks (around 06/04/2022) for post op.

## 2022-05-22 ENCOUNTER — Ambulatory Visit (INDEPENDENT_AMBULATORY_CARE_PROVIDER_SITE_OTHER): Payer: Medicare Other | Admitting: Family Medicine

## 2022-05-22 ENCOUNTER — Encounter: Payer: Self-pay | Admitting: Family Medicine

## 2022-05-22 VITALS — BP 136/48 | HR 66

## 2022-05-22 DIAGNOSIS — R413 Other amnesia: Secondary | ICD-10-CM | POA: Diagnosis not present

## 2022-05-22 DIAGNOSIS — G43109 Migraine with aura, not intractable, without status migrainosus: Secondary | ICD-10-CM | POA: Diagnosis not present

## 2022-05-22 DIAGNOSIS — F320 Major depressive disorder, single episode, mild: Secondary | ICD-10-CM | POA: Diagnosis not present

## 2022-05-22 DIAGNOSIS — I10 Essential (primary) hypertension: Secondary | ICD-10-CM | POA: Diagnosis not present

## 2022-05-22 NOTE — Assessment & Plan Note (Signed)
Some headache relief with chiropractic manipulation she is hoping to get back in and she has not been able to get regularly because of her foot surgery.

## 2022-05-22 NOTE — Assessment & Plan Note (Signed)
He is having a little bit more time remembering specific details.  Her husband is with her here today and supportive.

## 2022-05-22 NOTE — Assessment & Plan Note (Signed)
Pressure looks great today on current regimen.

## 2022-05-22 NOTE — Assessment & Plan Note (Addendum)
Stable on her current regimen of fluoxetine.

## 2022-05-22 NOTE — Progress Notes (Signed)
Established Patient Office Visit  Subjective   Patient ID: Joann Maddox, female    DOB: August 22, 1940  Age: 82 y.o. MRN: [id number]  Chief Complaint  Patient presents with   Hypertension    HPI  Hypertension- Pt denies chest pain, SOB, dizziness, or heart palpitations.  Taking meds as directed w/o problems.  Denies medication side effects.    She had foot surgery since I last saw her and is doing well.  She is still in a postop shoe on her left foot she also unfortunately has experienced a fracture in her right foot and had been wearing a cam walker on that 1 but she is out of it today.  She does not have her compression stockings on but she has been trying to keep her feet elevated and so feels like her swelling has actually been manageable.  She has chronic venous insufficiency.  Has been relying on Tylenol for pain management and that does help some.  He has been having more frequent headaches again.  For a while she was going to see her chiropractor regularly and that was helping with her neck pain which in turn was helping with her headaches.  But since her foot surgery she has not been able to go regularly but she is looking forward to trying to get back in to help with the headaches.  Been using mostly Tylenol.  Did follow-up with urology and they felt like she was on the right medication and encouraged her to continue with it.  They felt like at this time to just continue limiting fluids before bedtime and taking the medication.    ROS    Objective:     BP (!) 136/48   Pulse 66   SpO2 100%    Physical Exam Vitals and nursing note reviewed.  Constitutional:      Appearance: She is well-developed.  HENT:     Head: Normocephalic and atraumatic.  Cardiovascular:     Rate and Rhythm: Normal rate and regular rhythm.     Heart sounds: Normal heart sounds.  Pulmonary:     Effort: Pulmonary effort is normal.     Breath sounds: Normal breath sounds.  Skin:    General:  Skin is warm and dry.  Neurological:     Mental Status: She is alert and oriented to person, place, and time.  Psychiatric:        Behavior: Behavior normal.      No results found for any visits on 05/22/22.    The 10-year ASCVD risk score (Arnett DK, et al., 2019) is: 58.9%    Assessment & Plan:   Problem List Items Addressed This Visit       Cardiovascular and Mediastinum   Migraine with aura    Some headache relief with chiropractic manipulation she is hoping to get back in and she has not been able to get regularly because of her foot surgery.      Essential hypertension, benign - Primary    Pressure looks great today on current regimen.        Other   Memory change    He is having a little bit more time remembering specific details.  Her husband is with her here today and supportive.      Depression, major, single episode, mild (HCC)    Stable on her current regimen of fluoxetine.       Return in about 4 months (around 09/20/2022) for bp.  Beatrice Lecher, MD

## 2022-06-04 ENCOUNTER — Ambulatory Visit (INDEPENDENT_AMBULATORY_CARE_PROVIDER_SITE_OTHER): Payer: Medicare Other | Admitting: Podiatry

## 2022-06-04 ENCOUNTER — Encounter: Payer: Self-pay | Admitting: Podiatry

## 2022-06-04 ENCOUNTER — Other Ambulatory Visit: Payer: Self-pay | Admitting: Family Medicine

## 2022-06-04 DIAGNOSIS — Z09 Encounter for follow-up examination after completed treatment for conditions other than malignant neoplasm: Secondary | ICD-10-CM

## 2022-06-04 DIAGNOSIS — S92911A Unspecified fracture of right toe(s), initial encounter for closed fracture: Secondary | ICD-10-CM | POA: Diagnosis not present

## 2022-06-04 DIAGNOSIS — M205X2 Other deformities of toe(s) (acquired), left foot: Secondary | ICD-10-CM

## 2022-06-04 NOTE — Progress Notes (Signed)
Subjective:  Patient ID: Joann Maddox, female    DOB: October 31, 1940,  MRN: [id number]  Chief Complaint  Patient presents with   Routine Post Op    POV #2 DOS 05/12/2022 LT FOOT CHEILECTOMY. Patient denies any pain. Patient was ambulating without the cam boot on.     DOS: 05/12/21  Procedure: Left foot chielectomy   82 y.o. female returns for POV#1. 3 weeks post-op. Relates her left foot is doing well and not having too much pain. Presents today in regular shoes bilateral.   Relates the right foot is doing well as well. Did fracture the digits on the right. Has been in CAM boot.   Review of Systems: Negative except as noted in the HPI. Denies N/V/F/Ch.  Past Medical History:  Diagnosis Date   Allergy    Anxiety    Arthritis    Cataract    Depression    GERD (gastroesophageal reflux disease)    Headache(784.0)    migrains   Heart murmur    Hypothyroidism    Pre-diabetes    Rheumatoid arthritis(714.0)     Current Outpatient Medications:    azelaic acid (AZELEX) 20 % cream, APPLY TOPICALL 2 TIMES A DAY (MORNING AND EVENING). APPLY AFTER SKIN IS WASHED AND PATTED DRY., Disp: 30 g, Rfl: 6   cyanocobalamin (VITAMIN B12) 1000 MCG tablet, Take by mouth., Disp: , Rfl:    diclofenac Sodium (VOLTAREN) 1 % GEL, Apply topically., Disp: , Rfl:    FLUoxetine (PROZAC) 40 MG capsule, Take 1 capsule (40 mg total) by mouth daily., Disp: 90 capsule, Rfl: 1   gabapentin (NEURONTIN) 100 MG capsule, Take 2 capsules by mouth in the morning and mid afternoon. Separate prescription for 300 mg capsule at bedtime., Disp: 360 capsule, Rfl: 0   hydroxychloroquine (PLAQUENIL) 200 MG tablet, TALE 2 TABLETS BY MOUTH WITH FOOD OR MILK ONCE A DAY, Disp: 180 tablet, Rfl: 2   losartan (COZAAR) 25 MG tablet, TAKE 1 TABLET (25 MG TOTAL) BY MOUTH DAILY., Disp: 90 tablet, Rfl: 1   meloxicam (MOBIC) 15 MG tablet, TAKE 1 TABLET BY MOUTH EVERY DAY AS NEEDED FOR PAIN, Disp: 30 tablet, Rfl: 2   mirabegron ER (MYRBETRIQ) 50  MG TB24 tablet, Take 1 tablet (50 mg total) by mouth daily., Disp: 90 tablet, Rfl: 1   Multiple Vitamin (MULTIVITAMIN) tablet, Take 1 tablet by mouth daily., Disp: , Rfl:    nystatin ointment (MYCOSTATIN), Apply 1 Application topically 2 (two) times daily., Disp: 30 g, Rfl: PRN   potassium chloride (KLOR-CON) 10 MEQ tablet, TAKE 1 TABLET BY MOUTH 2 TIMES DAILY., Disp: 60 tablet, Rfl: 11   rosuvastatin (CRESTOR) 10 MG tablet, TAKE 1 TABLET BY MOUTH EVERYDAY AT BEDTIME, Disp: 90 tablet, Rfl: 3   senna-docusate (SENOKOT-S) 8.6-50 MG tablet, Take by mouth., Disp: , Rfl:    traMADol (ULTRAM) 50 MG tablet, TAKE 1 TO 2 TABLETS BY MOUTH 2 (TWO) TIMES DAILY AS NEEDED., Disp: 90 tablet, Rfl: 0   Vitamin D, Ergocalciferol, (DRISDOL) 1.25 MG (50000 UNIT) CAPS capsule, TAKE 1 CAPSULE (50,000 UNITS TOTAL) BY MOUTH EVERY SUNDAY., Disp: 12 capsule, Rfl: 3  Social History   Tobacco Use  Smoking Status Never  Smokeless Tobacco Never    Allergies  Allergen Reactions   Iodine Other (See Comments)    Shook violently and passed out.   Codeine Other (See Comments)    Hallucinations   Lipitor [Atorvastatin] Other (See Comments)    Myalgias   Objective:  There  were no vitals filed for this visit. There is no height or weight on file to calculate BMI. Constitutional Well developed. Well nourished.  Vascular Foot warm and well perfused. Capillary refill normal to all digits.   Neurologic Normal speech. Oriented to person, place, and time. Epicritic sensation to light touch grossly present bilaterally.  Dermatologic Skin healing well without signs of infection. Skin edges well coapted without signs of infection.  Orthopedic: Tenderness to palpation noted about the surgical site.   Radiographs: Left foot interval resection of first MPJ dorsal head and osteophytes.  On Right appears to be acute fracture noted to proximal phalanx of the third digit and avulsion fractures of the bases of the fourth and fifth  proximal phalanxes.  Assessment:   1. Postoperative examination   2. Closed fracture multiple phalanges, toe, right, initial encounter   3. Hallux limitus of left foot     Plan:  Patient was evaluated and treated and all questions answered.  S/p foot surgery left -Progressing as expected post-operatively. -WB Status: WBAT in regular shoe.  Shower as normal.  -Sutures: intact. -Medications: n/a    Right foot  -Xrays reviewed -Discussed treatement options for fracture; risks, alternatives, and benefits explained. -Continue with CAM boot and minimal weight bearing.  -Recommend protection, rest, ice, elevation daily until symptoms improve -Rx pain med/antinflammatories as needed Follow-up in 2 weeks.    Return in about 2 weeks (around 06/18/2022) for post-op Xr on right .

## 2022-06-18 ENCOUNTER — Ambulatory Visit (INDEPENDENT_AMBULATORY_CARE_PROVIDER_SITE_OTHER): Payer: Medicare Other | Admitting: Podiatry

## 2022-06-18 ENCOUNTER — Encounter: Payer: Self-pay | Admitting: Podiatry

## 2022-06-18 ENCOUNTER — Ambulatory Visit (INDEPENDENT_AMBULATORY_CARE_PROVIDER_SITE_OTHER): Payer: Medicare Other

## 2022-06-18 DIAGNOSIS — Z9889 Other specified postprocedural states: Secondary | ICD-10-CM

## 2022-06-18 DIAGNOSIS — M79671 Pain in right foot: Secondary | ICD-10-CM

## 2022-06-18 DIAGNOSIS — T81509A Unspecified complication of foreign body accidentally left in body following unspecified procedure, initial encounter: Secondary | ICD-10-CM

## 2022-06-18 DIAGNOSIS — S92911A Unspecified fracture of right toe(s), initial encounter for closed fracture: Secondary | ICD-10-CM | POA: Diagnosis not present

## 2022-06-18 NOTE — Progress Notes (Signed)
Subjective:  Patient ID: Joann Maddox, female    DOB: 1940/09/03,  MRN: [id number]  No chief complaint on file.   DOS: 05/12/21  Procedure: Left foot chielectomy   82 y.o. female returns for POV#3. 5 weeks post-op. Relates her left foot is doing well and not having too much pain. Presents today in regular shoes bilateral.   Relates the right foot is doing well as well. Did fracture the digits on the right. Has been in regular shoes now.   Review of Systems: Negative except as noted in the HPI. Denies N/V/F/Ch.  Past Medical History:  Diagnosis Date   Allergy    Anxiety    Arthritis    Cataract    Depression    GERD (gastroesophageal reflux disease)    Headache(784.0)    migrains   Heart murmur    Hypothyroidism    Pre-diabetes    Rheumatoid arthritis(714.0)     Current Outpatient Medications:    azelaic acid (AZELEX) 20 % cream, APPLY TOPICALL 2 TIMES A DAY (MORNING AND EVENING). APPLY AFTER SKIN IS WASHED AND PATTED DRY., Disp: 30 g, Rfl: 6   cyanocobalamin (VITAMIN B12) 1000 MCG tablet, Take by mouth., Disp: , Rfl:    diclofenac Sodium (VOLTAREN) 1 % GEL, Apply topically., Disp: , Rfl:    FLUoxetine (PROZAC) 40 MG capsule, Take 1 capsule (40 mg total) by mouth daily., Disp: 90 capsule, Rfl: 1   gabapentin (NEURONTIN) 100 MG capsule, TAKE 2 CAPSULES BY MOUTH IN THE MORNING AND MID AFTERNOON, Disp: 120 capsule, Rfl: 2   hydroxychloroquine (PLAQUENIL) 200 MG tablet, TALE 2 TABLETS BY MOUTH WITH FOOD OR MILK ONCE A DAY, Disp: 180 tablet, Rfl: 2   losartan (COZAAR) 25 MG tablet, TAKE 1 TABLET (25 MG TOTAL) BY MOUTH DAILY., Disp: 90 tablet, Rfl: 1   meloxicam (MOBIC) 15 MG tablet, TAKE 1 TABLET BY MOUTH EVERY DAY AS NEEDED FOR PAIN, Disp: 30 tablet, Rfl: 2   mirabegron ER (MYRBETRIQ) 50 MG TB24 tablet, Take 1 tablet (50 mg total) by mouth daily., Disp: 90 tablet, Rfl: 1   Multiple Vitamin (MULTIVITAMIN) tablet, Take 1 tablet by mouth daily., Disp: , Rfl:    nystatin ointment  (MYCOSTATIN), Apply 1 Application topically 2 (two) times daily., Disp: 30 g, Rfl: PRN   potassium chloride (KLOR-CON) 10 MEQ tablet, TAKE 1 TABLET BY MOUTH 2 TIMES DAILY., Disp: 60 tablet, Rfl: 11   rosuvastatin (CRESTOR) 10 MG tablet, TAKE 1 TABLET BY MOUTH EVERYDAY AT BEDTIME, Disp: 90 tablet, Rfl: 3   senna-docusate (SENOKOT-S) 8.6-50 MG tablet, Take by mouth., Disp: , Rfl:    traMADol (ULTRAM) 50 MG tablet, TAKE 1 TO 2 TABLETS BY MOUTH 2 (TWO) TIMES DAILY AS NEEDED., Disp: 90 tablet, Rfl: 0   Vitamin D, Ergocalciferol, (DRISDOL) 1.25 MG (50000 UNIT) CAPS capsule, TAKE 1 CAPSULE (50,000 UNITS TOTAL) BY MOUTH EVERY SUNDAY., Disp: 12 capsule, Rfl: 3  Social History   Tobacco Use  Smoking Status Never  Smokeless Tobacco Never    Allergies  Allergen Reactions   Iodine Other (See Comments)    Shook violently and passed out.   Codeine Other (See Comments)    Hallucinations   Lipitor [Atorvastatin] Other (See Comments)    Myalgias   Objective:  There were no vitals filed for this visit. There is no height or weight on file to calculate BMI. Constitutional Well developed. Well nourished.  Vascular Foot warm and well perfused. Capillary refill normal to all digits.  Neurologic Normal speech. Oriented to person, place, and time. Epicritic sensation to light touch grossly present bilaterally.  Dermatologic Skin healing well without signs of infection. Skin edges well coapted without signs of infection.  Orthopedic: Tenderness to palpation noted about the surgical site.   Radiographs: Left foot interval resection of first MPJ dorsal head and osteophytes.  On Right appears to be healing fracture noted to proximal phalanx of the third digit and avulsion fractures of the bases of the fourth and fifth proximal phalanxes.  Assessment:   1. Post-operative state   2. Closed fracture multiple phalanges, toe, right, initial encounter     Plan:  Patient was evaluated and treated and all  questions answered.  S/p foot surgery left -Progressing as expected post-operatively. -WB Status: WBAT in regular shoe.  Shower as normal.  -Sutures: intact. -Medications: n/a Follow-up in 6 weeks for post-op visit.     Right foot  -Xrays reviewed. Interval healing of right foot digit fractures of third fourth and fifth digit.  -Discussed treatement options for fracture; risks, alternatives, and benefits explained. -May begin WBAT in regular shoes.  -Recommend protection, rest, ice, elevation daily until symptoms improve -Rx pain med/antinflammatories as needed    Return in about 6 weeks (around 07/30/2022) for post op.

## 2022-07-23 ENCOUNTER — Other Ambulatory Visit: Payer: Self-pay | Admitting: Family Medicine

## 2022-07-23 DIAGNOSIS — F320 Major depressive disorder, single episode, mild: Secondary | ICD-10-CM

## 2022-07-28 ENCOUNTER — Other Ambulatory Visit: Payer: Self-pay | Admitting: Family Medicine

## 2022-07-28 DIAGNOSIS — M19012 Primary osteoarthritis, left shoulder: Secondary | ICD-10-CM

## 2022-07-28 MED ORDER — TRAMADOL HCL 50 MG PO TABS: ORAL_TABLET | ORAL | 0 refills | Status: DC

## 2022-07-30 ENCOUNTER — Ambulatory Visit (INDEPENDENT_AMBULATORY_CARE_PROVIDER_SITE_OTHER): Payer: Medicare Other | Admitting: Podiatry

## 2022-07-30 ENCOUNTER — Ambulatory Visit (INDEPENDENT_AMBULATORY_CARE_PROVIDER_SITE_OTHER): Payer: Medicare Other | Admitting: Family Medicine

## 2022-07-30 ENCOUNTER — Encounter: Payer: Self-pay | Admitting: Podiatry

## 2022-07-30 ENCOUNTER — Encounter: Payer: Self-pay | Admitting: Family Medicine

## 2022-07-30 VITALS — BP 128/69 | HR 71 | Ht 61.0 in | Wt 179.0 lb

## 2022-07-30 DIAGNOSIS — M7021 Olecranon bursitis, right elbow: Secondary | ICD-10-CM | POA: Diagnosis not present

## 2022-07-30 DIAGNOSIS — S92911D Unspecified fracture of right toe(s), subsequent encounter for fracture with routine healing: Secondary | ICD-10-CM

## 2022-07-30 DIAGNOSIS — H00021 Hordeolum internum right upper eyelid: Secondary | ICD-10-CM | POA: Diagnosis not present

## 2022-07-30 DIAGNOSIS — M542 Cervicalgia: Secondary | ICD-10-CM

## 2022-07-30 DIAGNOSIS — Z9889 Other specified postprocedural states: Secondary | ICD-10-CM

## 2022-07-30 MED ORDER — ERYTHROMYCIN 5 MG/GM OP OINT: 1.0000 | TOPICAL_OINTMENT | Freq: Every day | OPHTHALMIC | 0 refills | Status: DC

## 2022-07-30 NOTE — Progress Notes (Signed)
Subjective:  Patient ID: Joann Maddox, female    DOB: [birth year],  MRN: [id number]  No chief complaint on file.   DOS: 05/12/21  Procedure: Left foot chielectomy   82 y.o. female returns for POV#3. 11 weeks post-op. Relates her left foot is doing well and not having too much pain. Presents today in regular shoes bilateral.   Relates the right foot is doing well as well. Did fracture the digits on the right. Has been in regular shoes now.   Review of Systems: Negative except as noted in the HPI. Denies N/V/F/Ch.  Past Medical History:  Diagnosis Date   Allergy    Anxiety    Arthritis    Cataract    Depression    GERD (gastroesophageal reflux disease)    Headache(784.0)    migrains   Heart murmur    Hypothyroidism    Pre-diabetes    Rheumatoid arthritis(714.0)     Current Outpatient Medications:    azelaic acid (AZELEX) 20 % cream, APPLY TOPICALL 2 TIMES A DAY (MORNING AND EVENING). APPLY AFTER SKIN IS WASHED AND PATTED DRY., Disp: 30 g, Rfl: 6   cyanocobalamin (VITAMIN B12) 1000 MCG tablet, Take by mouth., Disp: , Rfl:    diclofenac Sodium (VOLTAREN) 1 % GEL, Apply topically., Disp: , Rfl:    erythromycin ophthalmic ointment, Place 1 Application into the right eye at bedtime. 7 days, Disp: 3.5 g, Rfl: 0   FLUoxetine (PROZAC) 40 MG capsule, TAKE 1 CAPSULE (40 MG TOTAL) BY MOUTH DAILY., Disp: 30 capsule, Rfl: 2   gabapentin (NEURONTIN) 100 MG capsule, TAKE 2 CAPSULES BY MOUTH IN THE MORNING AND MID AFTERNOON, Disp: 120 capsule, Rfl: 2   hydroxychloroquine (PLAQUENIL) 200 MG tablet, TALE 2 TABLETS BY MOUTH WITH FOOD OR MILK ONCE A DAY, Disp: 180 tablet, Rfl: 2   losartan (COZAAR) 25 MG tablet, TAKE 1 TABLET (25 MG TOTAL) BY MOUTH DAILY., Disp: 90 tablet, Rfl: 1   meloxicam (MOBIC) 15 MG tablet, TAKE 1 TABLET BY MOUTH EVERY DAY AS NEEDED FOR PAIN, Disp: 30 tablet, Rfl: 2   mirabegron ER (MYRBETRIQ) 50 MG TB24 tablet, Take 1 tablet (50 mg total) by mouth daily., Disp: 90 tablet,  Rfl: 1   Multiple Vitamin (MULTIVITAMIN) tablet, Take 1 tablet by mouth daily., Disp: , Rfl:    nystatin ointment (MYCOSTATIN), Apply 1 Application topically 2 (two) times daily., Disp: 30 g, Rfl: PRN   potassium chloride (KLOR-CON) 10 MEQ tablet, TAKE 1 TABLET BY MOUTH 2 TIMES DAILY., Disp: 60 tablet, Rfl: 11   rosuvastatin (CRESTOR) 10 MG tablet, TAKE 1 TABLET BY MOUTH EVERYDAY AT BEDTIME, Disp: 90 tablet, Rfl: 3   senna-docusate (SENOKOT-S) 8.6-50 MG tablet, Take by mouth., Disp: , Rfl:    traMADol (ULTRAM) 50 MG tablet, TAKE 1 TO 2 TABLETS BY MOUTH 2 (TWO) TIMES DAILY AS NEEDED., Disp: 90 tablet, Rfl: 0   Vitamin D, Ergocalciferol, (DRISDOL) 1.25 MG (50000 UNIT) CAPS capsule, TAKE 1 CAPSULE (50,000 UNITS TOTAL) BY MOUTH EVERY SUNDAY., Disp: 12 capsule, Rfl: 3  Social History   Tobacco Use  Smoking Status Never  Smokeless Tobacco Never    Allergies  Allergen Reactions   Iodine Other (See Comments)    Shook violently and passed out.   Codeine Other (See Comments)    Hallucinations   Lipitor [Atorvastatin] Other (See Comments)    Myalgias   Objective:  There were no vitals filed for this visit. There is no height or weight on file to  calculate BMI. Constitutional Well developed. Well nourished.  Vascular Foot warm and well perfused. Capillary refill normal to all digits.   Neurologic Normal speech. Oriented to person, place, and time. Epicritic sensation to light touch grossly present bilaterally.  Dermatologic Skin healing well without signs of infection. Skin edges well coapted without signs of infection.  Orthopedic: Tenderness to palpation noted about the surgical site.   Radiographs: Left foot interval resection of first MPJ dorsal head and osteophytes.  On Right appears to be healing fracture noted to proximal phalanx of the third digit and avulsion fractures of the bases of the fourth and fifth proximal phalanxes.  Assessment:   1. Post-operative state   2. Closed  fracture multiple phalanges of toe with routine healing, right      Plan:  Patient was evaluated and treated and all questions answered.  S/p foot surgery left -Progressing as expected post-operatively. -WB Status: WBAT in regular shoe.  -Medications: n/a Patient discharged from surgical standpoint. Return as needed.     Right foot  -Xrays reviewed. Interval healing of right foot digit fractures of third fourth and fifth digit.  -Discussed treatement options for fracture; risks, alternatives, and benefits explained. -WBAT in regular shoes.  -Rx pain med/antinflammatories as needed Return as needed.     No follow-ups on file.

## 2022-07-30 NOTE — Progress Notes (Signed)
Acute Office Visit  Subjective:     Patient ID: Joann Maddox, female    DOB: March 11, 1941, 82 y.o.   MRN: [id number]  Chief Complaint  Patient presents with   olecranon bursitis    Pt uses her R elbow when she is getting up from a sitting position.    bump on upper R eyelid    Pt reports that she had a bump on her R upper eyelid about 1 month ago. She stated that it has since gotten better. She has been using warm compresses.    referral    Pt's husband asked for a referral to Dr. Trula Ore for her neck and back issues. He said that he has been taking her to the chiropractor but that hasn't helped her with her neck and back pain that she has been experiencing.     HPI Patient is in today for right elbow and right eye swelling   She says she noticed a few days ago that the tip of her right elbow was really swollen and tender.  She does not report a specific injury or trauma but she does use her forearms and elbows frequently to push out.  She is right-handed.  He is also had a bump on her right upper eyelid for about a month.  She has been doing some warm compresses and says it is a little smaller than it was but it still present.  Sometimes she feels like that eyes a little bit irritated.   Pt's husband asked for a referral to Dr. Trula Ore for her neck and back issues. He said that he has been taking her to the chiropractor but that hasn't helped her with her neck and back pain that she has been experiencing     ROS      Objective:    BP 128/69   Pulse 71   Ht 5\' 1"  (1.549 m)   Wt 179 lb (81.2 kg)   SpO2 97%   BMI 33.82 kg/m    Physical Exam Vitals reviewed.  Constitutional:      Appearance: She is well-developed.  HENT:     Head: Normocephalic and atraumatic.  Eyes:     Conjunctiva/sclera: Conjunctivae normal.  Cardiovascular:     Rate and Rhythm: Normal rate.  Pulmonary:     Effort: Pulmonary effort is normal.  Musculoskeletal:     Comments: Swollen soft  smooth round effusion of the bursa at the tip of the olecranon on the right elbow.  On the right upper outer eyelid there is a smooth nodule with some mild erythema.  The conjunctive I are inflamed as well.  Skin:    General: Skin is dry.     Coloration: Skin is not pale.  Neurological:     Mental Status: She is alert and oriented to person, place, and time.  Psychiatric:        Behavior: Behavior normal.     No results found for any visits on 07/30/22.      Assessment & Plan:   Problem List Items Addressed This Visit       Other   Neck pain - Primary   Relevant Orders   Ambulatory referral to Neurology   Other Visit Diagnoses     Olecranon bursitis of right elbow       Hordeolum internum of right upper eyelid       Relevant Medications   erythromycin ophthalmic ointment       Olecranon bursitis-discussed  treatment with compression avoid using the elbows to push-up avoid resting the elbows on chair edges and shoulder rests.  Try to take any additional pressure off of the elbow.  Should hopefully improve over the next 3 weeks.  Wrapped with Ace wrap during office visit today.  Hordeolum of the right upper eyelid-additional information and handout provided.  Recommend continue with warm compresses will add erythromycin ophthalmic ointment at night for 1 week.  If not continuing to improve and decrease in size over the next 3 weeks then encouraged her to follow-up with her eye doctor.   Her husband would also like to come back in to discuss memory.  She actually does not remember having her neck surgery.  Her husband feels like it is progressing.  Meds ordered this encounter  Medications   erythromycin ophthalmic ointment    Sig: Place 1 Application into the right eye at bedtime. 7 days    Dispense:  3.5 g    Refill:  0    Return in about 26 days (around 08/25/2022) for Pre-diabetes and memory testing. Beatrice Lecher, MD

## 2022-07-31 ENCOUNTER — Other Ambulatory Visit: Payer: Self-pay | Admitting: Family Medicine

## 2022-08-06 LAB — VITAMIN B1: Vitamin B1 (Thiamine): 13

## 2022-08-06 LAB — VITAMIN B6: Vitamin B6: 16.9

## 2022-08-17 ENCOUNTER — Other Ambulatory Visit: Payer: Self-pay | Admitting: Specialist

## 2022-08-17 ENCOUNTER — Ambulatory Visit (INDEPENDENT_AMBULATORY_CARE_PROVIDER_SITE_OTHER): Payer: Medicare Other

## 2022-08-17 DIAGNOSIS — M5410 Radiculopathy, site unspecified: Secondary | ICD-10-CM | POA: Diagnosis not present

## 2022-08-17 DIAGNOSIS — M542 Cervicalgia: Secondary | ICD-10-CM | POA: Diagnosis not present

## 2022-08-27 ENCOUNTER — Ambulatory Visit (INDEPENDENT_AMBULATORY_CARE_PROVIDER_SITE_OTHER): Payer: Medicare Other | Admitting: Family Medicine

## 2022-08-27 ENCOUNTER — Other Ambulatory Visit: Payer: Self-pay | Admitting: Family Medicine

## 2022-08-27 ENCOUNTER — Encounter: Payer: Self-pay | Admitting: Family Medicine

## 2022-08-27 VITALS — BP 130/55 | HR 66 | Ht 61.0 in | Wt 183.0 lb

## 2022-08-27 DIAGNOSIS — G301 Alzheimer's disease with late onset: Secondary | ICD-10-CM

## 2022-08-27 DIAGNOSIS — M542 Cervicalgia: Secondary | ICD-10-CM

## 2022-08-27 DIAGNOSIS — I10 Essential (primary) hypertension: Secondary | ICD-10-CM

## 2022-08-27 DIAGNOSIS — E785 Hyperlipidemia, unspecified: Secondary | ICD-10-CM

## 2022-08-27 DIAGNOSIS — M19012 Primary osteoarthritis, left shoulder: Secondary | ICD-10-CM

## 2022-08-27 DIAGNOSIS — N3946 Mixed incontinence: Secondary | ICD-10-CM

## 2022-08-27 DIAGNOSIS — R7301 Impaired fasting glucose: Secondary | ICD-10-CM

## 2022-08-27 DIAGNOSIS — R413 Other amnesia: Secondary | ICD-10-CM

## 2022-08-27 DIAGNOSIS — E0590 Thyrotoxicosis, unspecified without thyrotoxic crisis or storm: Secondary | ICD-10-CM

## 2022-08-27 DIAGNOSIS — N3944 Nocturnal enuresis: Secondary | ICD-10-CM

## 2022-08-27 DIAGNOSIS — F02A0 Dementia in other diseases classified elsewhere, mild, without behavioral disturbance, psychotic disturbance, mood disturbance, and anxiety: Secondary | ICD-10-CM

## 2022-08-27 LAB — CBC
MCH: 30.1 pg (ref 27.0–33.0)
MCHC: 32.4 g/dL (ref 32.0–36.0)
MCV: 92.8 fL (ref 80.0–100.0)
Platelets: 207 10*3/uL (ref 140–400)
RBC: 3.89 10*6/uL (ref 3.80–5.10)
WBC: 4.4 10*3/uL (ref 3.8–10.8)

## 2022-08-27 LAB — POCT GLYCOSYLATED HEMOGLOBIN (HGB A1C): Hemoglobin A1C: 5.4 % — AB (ref 4.0–5.6)

## 2022-08-27 MED ORDER — LOSARTAN POTASSIUM 25 MG PO TABS: 25.0000 mg | ORAL_TABLET | Freq: Every day | ORAL | 3 refills | Status: DC

## 2022-08-27 MED ORDER — MIRABEGRON ER 50 MG PO TB24
50.0000 mg | ORAL_TABLET | Freq: Every day | ORAL | 3 refills | Status: DC
Start: 2022-08-27 — End: 2023-07-21

## 2022-08-27 MED ORDER — ROSUVASTATIN CALCIUM 10 MG PO TABS: ORAL_TABLET | ORAL | 3 refills | Status: DC

## 2022-08-27 MED ORDER — TRAMADOL HCL 50 MG PO TABS
ORAL_TABLET | ORAL | 1 refills | Status: DC
Start: 2022-08-27 — End: 2022-12-29

## 2022-08-27 NOTE — Assessment & Plan Note (Signed)
08/27/22: MoCA: 18/30.  Distant with mild dementia.  We reviewed the results together with her and her husband today.  The neurologist that she saw this morning did go ahead and send him in a medication for her memory.  There are lots of different options that we can consider if she does have problems or side effects with the medication.  We also discussed getting an updated brain MRI for further evaluation.  Will continue to monitor memory with MoCA testing.

## 2022-08-27 NOTE — Progress Notes (Addendum)
Established Patient Office Visit  Subjective   Patient ID: Joann Maddox, female    DOB: 19-Jul-1940  Age: 82 y.o. MRN: [id number]  Chief Complaint  Patient presents with   ifg   memory testing    HPI  Hypertension- Pt denies chest pain, SOB, dizziness, or heart palpitations.  Taking meds as directed w/o problems.  Denies medication side effects.    Impaired fasting glucose-no increased thirst or urination. No symptoms consistent with hypoglycemia.  They did see the rheumatologist this morning and brought in a copy of some recent labs.  B12 was 343, vitamin B1 was 13, and vitamin B6 was 16.  Will get these abstracted into the chart.  She is also here today for memory testing.  Her husband feels like her decline has been more progressive and may be a little bit more rapid than it was previously.  He has started administering her medications instead of allowing her to take them home to herself.  She is repeating herself more often.     01/07/2022   10:34 AM 01/06/2021   11:30 AM  6CIT Screen  What Year? 0 points 0 points  What month? 0 points 0 points  What time? 0 points 0 points  Count back from 20 0 points 0 points  Months in reverse 0 points 0 points  Repeat phrase 2 points 0 points  Total Score 2 points 0 points    She still really struggling with nighttime enuresis.  Some new protective items for the sheets etc. from a local medical supplier and that has been helping some.  She is on Myrbetriq 50 mg.  She did see the urologist in January.  And they did want to see her back in 3 months.    ROS    Objective:     BP (!) 130/55   Pulse 66   Ht 5\' 1"  (1.549 m)   Wt 183 lb (83 kg)   SpO2 96%   BMI 34.58 kg/m    Physical Exam Vitals and nursing note reviewed.  Constitutional:      Appearance: She is well-developed.  HENT:     Head: Normocephalic and atraumatic.  Cardiovascular:     Rate and Rhythm: Normal rate and regular rhythm.     Heart sounds: Normal  heart sounds.  Pulmonary:     Effort: Pulmonary effort is normal.     Breath sounds: Normal breath sounds.  Skin:    General: Skin is warm and dry.  Neurological:     Mental Status: She is alert and oriented to person, place, and time.  Psychiatric:        Behavior: Behavior normal.      Results for orders placed or performed in visit on 08/27/22  POCT glycosylated hemoglobin (Hb A1C)  Result Value Ref Range   Hemoglobin A1C 5.4 (A) 4.0 - 5.6 %   HbA1c POC (<> result, manual entry)     HbA1c, POC (prediabetic range)     HbA1c, POC (controlled diabetic range)        The ASCVD Risk score (Arnett DK, et al., 2019) failed to calculate for the following reasons:   The 2019 ASCVD risk score is only valid for ages 60 to 42    Assessment & Plan:   Problem List Items Addressed This Visit       Cardiovascular and Mediastinum   Essential hypertension, benign   Relevant Medications   losartan (COZAAR) 25 MG tablet  rosuvastatin (CRESTOR) 10 MG tablet   Other Relevant Orders   COMPLETE METABOLIC PANEL WITH GFR   CBC   TSH   VITAMIN D 25 Hydroxy (Vit-D Deficiency, Fractures)   Lipid Panel w/reflex Direct LDL     Endocrine   Subclinical hyperthyroidism    Recheck TSH just to make sure that it is not contributing to memory concerns.      Relevant Orders   TSH   IFG (impaired fasting glucose) - Primary    A1c is 5.4 today which looks phenomenal.  Plan to recheck again in 1 year.      Relevant Medications   losartan (COZAAR) 25 MG tablet   Other Relevant Orders   POCT glycosylated hemoglobin (Hb A1C) (Completed)   COMPLETE METABOLIC PANEL WITH GFR   CBC   TSH   VITAMIN D 25 Hydroxy (Vit-D Deficiency, Fractures)     Nervous and Auditory   Mild dementia (HCC)    08/27/22: MoCA: 18/30.  Distant with mild dementia.  We reviewed the results together with her and her husband today.  The neurologist that she saw this morning did go ahead and send him in a medication for her  memory.  There are lots of different options that we can consider if she does have problems or side effects with the medication.  We also discussed getting an updated brain MRI for further evaluation.  Will continue to monitor memory with MoCA testing.      Relevant Medications   losartan (COZAAR) 25 MG tablet     Other   Urinary incontinence   Relevant Medications   mirabegron ER (MYRBETRIQ) 50 MG TB24 tablet   Neck pain    Still does not have great pain control she has been seeing the chiropractor regularly but is also getting scheduled for injections.      Mixed stress and urge urinary incontinence    Continue with Myrbetriq 50 mg.  Encouraged her to follow back up with urology.  Also mentioned may be considering something like pure wick which might be helpful.  The urology consult did not recommend any type of procedure or surgical intervention just continuing to work in fluid status.  She does do better if she does not drink fluids after dinnertime.      Relevant Medications   mirabegron ER (MYRBETRIQ) 50 MG TB24 tablet   Hyperlipidemia    Due to recheck lipids.      Relevant Medications   losartan (COZAAR) 25 MG tablet   rosuvastatin (CRESTOR) 10 MG tablet   Other Relevant Orders   Lipid Panel w/reflex Direct LDL    Return in about 3 months (around 11/27/2022) for New start medication.   I spent 45 minutes on the day of the encounter to include pre-visit record review, face-to-face time with the patient and post visit ordering of test.   Nani Gasser, MD

## 2022-08-27 NOTE — Patient Instructions (Signed)
Schedule your eye exam when you can.

## 2022-08-27 NOTE — Assessment & Plan Note (Signed)
A1c is 5.4 today which looks phenomenal.  Plan to recheck again in 1 year.

## 2022-08-27 NOTE — Assessment & Plan Note (Signed)
Still does not have great pain control she has been seeing the chiropractor regularly but is also getting scheduled for injections.

## 2022-08-27 NOTE — Assessment & Plan Note (Signed)
Recheck TSH just to make sure that it is not contributing to memory concerns.

## 2022-08-27 NOTE — Assessment & Plan Note (Signed)
Due to recheck lipids. 

## 2022-08-27 NOTE — Assessment & Plan Note (Addendum)
Continue with Myrbetriq 50 mg.  Encouraged her to follow back up with urology.  Also mentioned may be considering something like pure wick which might be helpful.  The urology consult did not recommend any type of procedure or surgical intervention just continuing to work in fluid status.  She does do better if she does not drink fluids after dinnertime.

## 2022-08-28 ENCOUNTER — Telehealth: Payer: Self-pay

## 2022-08-28 LAB — COMPLETE METABOLIC PANEL WITH GFR
AG Ratio: 1.7 (calc) (ref 1.0–2.5)
ALT: 22 U/L (ref 6–29)
AST: 29 U/L (ref 10–35)
Albumin: 3.8 g/dL (ref 3.6–5.1)
Alkaline phosphatase (APISO): 75 U/L (ref 37–153)
BUN/Creatinine Ratio: 35 (calc) — ABNORMAL HIGH (ref 6–22)
BUN: 26 mg/dL — ABNORMAL HIGH (ref 7–25)
CO2: 27 mmol/L (ref 20–32)
Calcium: 9.2 mg/dL (ref 8.6–10.4)
Chloride: 107 mmol/L (ref 98–110)
Creat: 0.74 mg/dL (ref 0.60–0.95)
Globulin: 2.3 g/dL (calc) (ref 1.9–3.7)
Glucose, Bld: 138 mg/dL — ABNORMAL HIGH (ref 65–99)
Potassium: 4.4 mmol/L (ref 3.5–5.3)
Sodium: 143 mmol/L (ref 135–146)
Total Bilirubin: 0.6 mg/dL (ref 0.2–1.2)
Total Protein: 6.1 g/dL (ref 6.1–8.1)
eGFR: 82 mL/min/{1.73_m2} (ref >=60)

## 2022-08-28 LAB — VITAMIN D 25 HYDROXY (VIT D DEFICIENCY, FRACTURES): Vit D, 25-Hydroxy: 106 ng/mL — ABNORMAL HIGH (ref 30–100)

## 2022-08-28 LAB — CBC
HCT: 36.1 % (ref 35.0–45.0)
Hemoglobin: 11.7 g/dL (ref 11.7–15.5)
MPV: 10.1 fL (ref 7.5–12.5)
RDW: 12.5 % (ref 11.0–15.0)

## 2022-08-28 LAB — LIPID PANEL W/REFLEX DIRECT LDL
Cholesterol: 141 mg/dL (ref <200)
HDL: 86 mg/dL (ref >=50)
LDL Cholesterol (Calc): 42 mg/dL (calc)
Non-HDL Cholesterol (Calc): 55 mg/dL (calc) (ref <130)
Total CHOL/HDL Ratio: 1.6 (calc) (ref <5.0)
Triglycerides: 51 mg/dL (ref <150)

## 2022-08-28 LAB — TSH: TSH: 0.47 mIU/L (ref 0.40–4.50)

## 2022-08-28 NOTE — Addendum Note (Signed)
Addended by: Lizmary Nader D on: 08/28/2022 12:57 PM   Modules accepted: Level of Service  

## 2022-08-28 NOTE — Progress Notes (Signed)
Joann Maddox, kidney and liver function are stable.  Vitamin D looks great.  Let count and thyroid are normal.  Cholesterol all looks good as well.

## 2022-08-28 NOTE — Telephone Encounter (Signed)
Auth denied from insurance for MRI brain w wo contrast. Per insurance: request does not meet medical necessity for memory loss.   Provider will have to complete a peer to peer review at [phone]. Case/Ref # [id number].

## 2022-08-28 NOTE — Telephone Encounter (Signed)
Pls disregard last note encounter.   Received a call from [facility] medical reviewer. Peer to peer request completed. Auth for MRI Brain has been approved from 08/27/22 to 09/26/22. Case # [id number]. Imaging dept notified to contact patient for scheduling.

## 2022-08-31 NOTE — Telephone Encounter (Signed)
Pls add both to med list

## 2022-09-01 ENCOUNTER — Encounter: Payer: Self-pay | Admitting: Family Medicine

## 2022-09-01 NOTE — Telephone Encounter (Signed)
Both have been added.

## 2022-09-03 ENCOUNTER — Encounter: Payer: Self-pay | Admitting: Family Medicine

## 2022-09-07 ENCOUNTER — Ambulatory Visit (INDEPENDENT_AMBULATORY_CARE_PROVIDER_SITE_OTHER): Payer: Medicare Other

## 2022-09-07 DIAGNOSIS — R413 Other amnesia: Secondary | ICD-10-CM

## 2022-09-07 MED ORDER — GADOBUTROL 1 MMOL/ML IV SOLN
8.5000 mL | Freq: Once | INTRAVENOUS | Status: AC | PRN
  Administered 2022-09-07: 10 mL via INTRAVENOUS

## 2022-09-08 NOTE — Progress Notes (Signed)
Hi Joann Maddox,  MRI shows no evidence of acute abnormality such as a mass or tumor or stroke.  There is some advanced white matter chronic small vessel changes.  This can come from blood pressure or plaque formation over time and years.  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  Little inflammation in your sinuses but no acute infection.  They did make a note about something possibly in the basal ganglia correlating with liver disease but as far as we know you have no known liver disease.  In fact we had just checked her enzymes about 2 weeks ago and they looked completely normal.

## 2022-09-10 ENCOUNTER — Other Ambulatory Visit: Payer: Self-pay | Admitting: Family Medicine

## 2022-09-10 DIAGNOSIS — F320 Major depressive disorder, single episode, mild: Secondary | ICD-10-CM

## 2022-09-14 ENCOUNTER — Other Ambulatory Visit: Payer: Self-pay | Admitting: Family Medicine

## 2022-09-21 ENCOUNTER — Other Ambulatory Visit: Payer: Self-pay | Admitting: Family Medicine

## 2022-09-22 ENCOUNTER — Ambulatory Visit (INDEPENDENT_AMBULATORY_CARE_PROVIDER_SITE_OTHER): Payer: Medicare Other | Admitting: Family Medicine

## 2022-09-22 ENCOUNTER — Encounter: Payer: Self-pay | Admitting: Family Medicine

## 2022-09-22 VITALS — BP 133/52 | HR 66 | Ht 61.0 in | Wt 171.0 lb

## 2022-09-22 DIAGNOSIS — R112 Nausea with vomiting, unspecified: Secondary | ICD-10-CM

## 2022-09-22 LAB — CBC WITH DIFFERENTIAL/PLATELET
Absolute Monocytes: 486 cells/uL (ref 200–950)
Basophils Absolute: 38 cells/uL (ref 0–200)
Eosinophils Absolute: 211 cells/uL (ref 15–500)
Eosinophils Relative: 3.9 %
HCT: 35.8 % (ref 35.0–45.0)
Monocytes Relative: 9 %
Neutrophils Relative %: 58 %
Total Lymphocyte: 28.4 %
WBC: 5.4 10*3/uL (ref 3.8–10.8)

## 2022-09-22 MED ORDER — ONDANSETRON 4 MG PO TBDP: 4.0000 mg | ORAL_TABLET | Freq: Three times a day (TID) | ORAL | 0 refills | Status: DC | PRN

## 2022-09-22 NOTE — Patient Instructions (Signed)
Please stop the Aricept for now and see if you are feeling better by the end of the week.  I sent over a medicine called Zofran for nausea.  It can make you a little sleepy but it does help soothe the stomach.

## 2022-09-22 NOTE — Progress Notes (Signed)
   Acute Office Visit  Subjective:     Patient ID: Joann Maddox, female    DOB: Apr 06, 1941, 82 y.o.   MRN: [id number]  Chief Complaint  Patient presents with   Hypertension    HPI Patient is in today for symptoms of nausea and intermittent vomiting for about 5 days.  She started a new prescription of Aricept on 5 2.  She went up to a whole tab on 5 9.  And then around 08/27/2021 she started feeling nauseated having intermittent abdominal pain and having some vomiting.  Her husband never went up to the twice a day dosing and just dated the once a day dosing.  She is use Pepto-Bismol a couple of times.  Abdominal pain goes across her abdomen in the mid area.  Localized to 1 quadrant.  Appetite has been down significantly.  Her weight is down about 12 pounds since she was here 4 weeks ago.  She did run out of gabapentin a day or 2 ago.   ROS      Objective:    BP (!) 133/52   Pulse 66   Ht 5\' 1"  (1.549 m)   Wt 171 lb (77.6 kg)   SpO2 97%   BMI 32.31 kg/m    Physical Exam Vitals and nursing note reviewed.  Constitutional:      Appearance: She is well-developed.  HENT:     Head: Normocephalic and atraumatic.  Cardiovascular:     Rate and Rhythm: Normal rate and regular rhythm.     Heart sounds: Normal heart sounds.  Pulmonary:     Effort: Pulmonary effort is normal.     Breath sounds: Normal breath sounds.  Abdominal:     General: There is no distension.     Palpations: Abdomen is soft.     Tenderness: There is no abdominal tenderness.  Skin:    General: Skin is warm and dry.  Neurological:     Mental Status: She is alert and oriented to person, place, and time.  Psychiatric:        Behavior: Behavior normal.     No results found for any visits on 09/22/22.      Assessment & Plan:   Problem List Items Addressed This Visit   None Visit Diagnoses     Nausea and vomiting, unspecified vomiting type    -  Primary   Relevant Orders   COMPLETE METABOLIC PANEL  WITH GFR   CBC with Differential/Platelet   Lipase      Nausea and intermittent vomiting with abdominal pain-it could be a medication side effect from the Aricept we discussed stopping it.  They do have an appointment with a neurologist later this week.  Sent over prescription for Zofran.  Will do some additional labs today.   Meds ordered this encounter  Medications   ondansetron (ZOFRAN-ODT) 4 MG disintegrating tablet    Sig: Take 1 tablet (4 mg total) by mouth every 8 (eight) hours as needed for nausea or vomiting.    Dispense:  20 tablet    Refill:  0    Return in about 9 days (around 10/01/2022) for weight check wiht nurse visit.  Nani Gasser, MD

## 2022-09-23 ENCOUNTER — Encounter: Payer: Self-pay | Admitting: Family Medicine

## 2022-09-23 LAB — COMPLETE METABOLIC PANEL WITH GFR
AG Ratio: 1.7 (calc) (ref 1.0–2.5)
ALT: 23 U/L (ref 6–29)
AST: 27 U/L (ref 10–35)
Albumin: 3.8 g/dL (ref 3.6–5.1)
Alkaline phosphatase (APISO): 71 U/L (ref 37–153)
BUN: 22 mg/dL (ref 7–25)
CO2: 22 mmol/L (ref 20–32)
Calcium: 9.5 mg/dL (ref 8.6–10.4)
Chloride: 105 mmol/L (ref 98–110)
Creat: 0.8 mg/dL (ref 0.60–0.95)
Globulin: 2.3 g/dL (calc) (ref 1.9–3.7)
Glucose, Bld: 75 mg/dL (ref 65–99)
Potassium: 4.8 mmol/L (ref 3.5–5.3)
Sodium: 141 mmol/L (ref 135–146)
Total Bilirubin: 0.5 mg/dL (ref 0.2–1.2)
Total Protein: 6.1 g/dL (ref 6.1–8.1)
eGFR: 74 mL/min/{1.73_m2} (ref >=60)

## 2022-09-23 LAB — CBC WITH DIFFERENTIAL/PLATELET
Basophils Relative: 0.7 %
Hemoglobin: 12.1 g/dL (ref 11.7–15.5)
Lymphs Abs: 1534 cells/uL (ref 850–3900)
MCH: 30.6 pg (ref 27.0–33.0)
MCHC: 33.8 g/dL (ref 32.0–36.0)
MCV: 90.4 fL (ref 80.0–100.0)
MPV: 10.2 fL (ref 7.5–12.5)
Neutro Abs: 3132 cells/uL (ref 1500–7800)
Platelets: 230 10*3/uL (ref 140–400)
RBC: 3.96 10*6/uL (ref 3.80–5.10)
RDW: 12.9 % (ref 11.0–15.0)

## 2022-09-23 LAB — LIPASE: Lipase: 33 U/L (ref 7–60)

## 2022-09-23 NOTE — Progress Notes (Signed)
Hi Joann Maddox, kidney function is stable.  You do look better hydrated this time than you did 3 weeks ago which is actually good considering that you have not been eating as much.  Liver function is good.  Blood count is normal no sign of systemic infection.  No sign of pancreatitis.  I am feeling more and more confident that this is probably a medication side effect.  Hopefully you will feel better in the next couple of days after holding the medication.

## 2022-10-01 ENCOUNTER — Ambulatory Visit (INDEPENDENT_AMBULATORY_CARE_PROVIDER_SITE_OTHER): Payer: Medicare Other | Admitting: Family Medicine

## 2022-10-01 VITALS — BP 110/36 | HR 63 | Temp 97.6°F | Ht 61.0 in | Wt 172.1 lb

## 2022-10-01 DIAGNOSIS — G301 Alzheimer's disease with late onset: Secondary | ICD-10-CM | POA: Diagnosis not present

## 2022-10-01 DIAGNOSIS — F02A0 Dementia in other diseases classified elsewhere, mild, without behavioral disturbance, psychotic disturbance, mood disturbance, and anxiety: Secondary | ICD-10-CM

## 2022-10-01 MED ORDER — RIVASTIGMINE 4.6 MG/24HR TD PT24
4.6000 mg | MEDICATED_PATCH | Freq: Every day | TRANSDERMAL | 12 refills | Status: DC
Start: 2022-10-01 — End: 2022-12-29

## 2022-10-01 NOTE — Progress Notes (Signed)
Memantine added to intolerance list.  Will see if we can get Exelon patch covered I think if we could bypass the gut she will have much less GI upset I think it is at least worth trying.  Meds ordered this encounter  Medications   rivastigmine (EXELON) 4.6 mg/24hr    Sig: Place 1 patch (4.6 mg total) onto the skin daily.    Dispense:  30 patch    Refill:  12

## 2022-10-01 NOTE — Progress Notes (Signed)
Patient is here for a weight and blood pressure check. Denies trouble sleeping, palpitations or medication problems.   Patient has gained weight since last office visit. Blood pressure reading was not at goal 129/44, pulse 67. Patient sat for 15 minutes, blood pressure was still not at goal, 110/36, pulse 63. Per provider, patient is to take 1/2 tab of losartan and monitor their blood pressure at home. Patient advised to contact the clinic if blood pressure does not improve. Agreeable with current recommendations.   Patient was accompanied by her spouse. Per spouse, patient was having severe GI symptoms while taking the Memantine Hcl med for 6 days. Patient stopped taking the med two days ago and is feeling much better. Patient's husband did mention she had a similar reactions to another med like Memantine Hcl. Allergy list has been updated.   Patient advised to schedule a follow up with provider as recommended.

## 2022-10-24 ENCOUNTER — Other Ambulatory Visit: Payer: Self-pay | Admitting: Family Medicine

## 2022-10-27 ENCOUNTER — Encounter: Payer: Self-pay | Admitting: Family Medicine

## 2022-11-16 ENCOUNTER — Other Ambulatory Visit: Payer: Self-pay | Admitting: Family Medicine

## 2022-11-16 DIAGNOSIS — E785 Hyperlipidemia, unspecified: Secondary | ICD-10-CM

## 2022-11-27 ENCOUNTER — Ambulatory Visit (INDEPENDENT_AMBULATORY_CARE_PROVIDER_SITE_OTHER): Payer: Medicare Other | Admitting: Family Medicine

## 2022-11-27 ENCOUNTER — Encounter: Payer: Self-pay | Admitting: Family Medicine

## 2022-11-27 VITALS — BP 141/60 | HR 76 | Ht 61.0 in | Wt 180.0 lb

## 2022-11-27 DIAGNOSIS — G8929 Other chronic pain: Secondary | ICD-10-CM

## 2022-11-27 DIAGNOSIS — G301 Alzheimer's disease with late onset: Secondary | ICD-10-CM | POA: Diagnosis not present

## 2022-11-27 DIAGNOSIS — R600 Localized edema: Secondary | ICD-10-CM | POA: Diagnosis not present

## 2022-11-27 DIAGNOSIS — M542 Cervicalgia: Secondary | ICD-10-CM | POA: Diagnosis not present

## 2022-11-27 DIAGNOSIS — I10 Essential (primary) hypertension: Secondary | ICD-10-CM | POA: Diagnosis not present

## 2022-11-27 DIAGNOSIS — F02A0 Dementia in other diseases classified elsewhere, mild, without behavioral disturbance, psychotic disturbance, mood disturbance, and anxiety: Secondary | ICD-10-CM

## 2022-11-27 MED ORDER — FUROSEMIDE 20 MG PO TABS
20.0000 mg | ORAL_TABLET | Freq: Every day | ORAL | 0 refills | Status: DC | PRN
Start: 2022-11-27 — End: 2022-12-23

## 2022-11-27 NOTE — Patient Instructions (Signed)
He is increased blood pressure pill back to a whole tab daily.  You can get the second half of the tab when you get home today and then start with a whole tab tomorrow.  Okay to start the furosemide 1 tab daily in the morning.  You should notice some increase in urination.  Lets see if by after the weekend your blood pressure is looking better and that the swelling is improved as well.

## 2022-11-27 NOTE — Progress Notes (Signed)
Established Patient Office Visit  Subjective   Patient ID: Joann Maddox, female    DOB: 16-Aug-1940  Age: 82 y.o. MRN: [id number]  Chief Complaint  Patient presents with   Hypertension    HPI  We recently adjusted her medication for her memory and switched her to the Exelon patch.  She was having significant GI symptoms and nausea on the Aricept. She is feeling much better on the patch.  The diarrhea and vomiting has resolved.  But she also notes in the last 3 to 4 days she has had some increase swelling in her lower extremities from the knees down and she consistently wears her compression stockings.  She has not been taking any of her NSAIDs lately.  No dietary changes.  No long car rides etc.  The swelling is bilateral she does not feel short of breath.  HTN -still taking half a tab of the blood pressure pill.  Unfortunately the injections in her neck have not really been helping so she is just to take a break for right now.  He has been seeing a chiropractor and has been finding that helpful for her chronic neck pain.    ROS    Objective:     BP (!) 141/60   Pulse 76   Ht 5\' 1"  (1.549 m)   Wt 180 lb (81.6 kg)   SpO2 93%   BMI 34.01 kg/m    Physical Exam Vitals and nursing note reviewed.  Constitutional:      Appearance: She is well-developed.  HENT:     Head: Normocephalic and atraumatic.  Cardiovascular:     Rate and Rhythm: Normal rate and regular rhythm.     Heart sounds: Normal heart sounds.  Pulmonary:     Effort: Pulmonary effort is normal.     Breath sounds: Normal breath sounds.  Skin:    General: Skin is warm and dry.     Comments: 1+ pitting edem in lower legs to her knee and 2+ in her feet bilaterally  Neurological:     Mental Status: She is alert and oriented to person, place, and time.  Psychiatric:        Behavior: Behavior normal.      No results found for any visits on 11/27/22.    The ASCVD Risk score (Arnett DK, et al., 2019)  failed to calculate for the following reasons:   The 2019 ASCVD risk score is only valid for ages 62 to 11    Assessment & Plan:   Problem List Items Addressed This Visit       Cardiovascular and Mediastinum   Essential hypertension, benign    BP is up which could be rom the swelling or vice versa.  He is increased blood pressure pill back to a whole tab daily.  You can get the second half of the tab when you get home today and then start with a whole tab tomorrow.  Okay to start the furosemide 1 tab daily in the morning.  You should notice some increase in urination.  Lets see if by after the weekend your blood pressure is looking better and that the swelling is improved as well.      Relevant Medications   furosemide (LASIX) 20 MG tablet     Nervous and Auditory   Mild dementia (HCC)    Doing much better with the Exelon patch and her husband feels like he is might have noticed just some slight improvement and a  little bit more thought process happening.        Other   Chronic neck pain    Seeing a chiropractor for now.        Other Visit Diagnoses     Lower extremity edema    -  Primary   Relevant Medications   furosemide (LASIX) 20 MG tablet   Other Relevant Orders   CMP14+EGFR   Urinalysis, Routine w reflex microscopic      Will check labs to rule out change in kidney function. Recent thyroid level was normal.  Will check UA for protein loss.    Return in about 1 month (around 12/28/2022) for Hypertension, swelling.   I spent 40 minutes on the day of the encounter to include pre-visit record review, face-to-face time with the patient and post visit ordering of test.   Nani Gasser, MD

## 2022-11-27 NOTE — Assessment & Plan Note (Signed)
Doing much better with the Exelon patch and her husband feels like he is might have noticed just some slight improvement and a little bit more thought process happening.

## 2022-11-27 NOTE — Assessment & Plan Note (Signed)
BP is up which could be rom the swelling or vice versa.  He is increased blood pressure pill back to a whole tab daily.  You can get the second half of the tab when you get home today and then start with a whole tab tomorrow.  Okay to start the furosemide 1 tab daily in the morning.  You should notice some increase in urination.  Lets see if by after the weekend your blood pressure is looking better and that the swelling is improved as well.

## 2022-11-27 NOTE — Assessment & Plan Note (Signed)
Seeing a chiropractor for now.

## 2022-11-28 ENCOUNTER — Other Ambulatory Visit: Payer: Self-pay | Admitting: Family Medicine

## 2022-11-30 NOTE — Progress Notes (Signed)
Hi Alianna and Dorene Sorrow, kidney function looks stable.  Liver enzymes are normal.  Protein levels just a little low so just make sure you are getting enough protein in your diet this can come from things like eggs nuts legumes and meat.  Red blood cells in the urine which is reassuring.  Please call the lab and see if we can add a culture to the urine sample.

## 2022-12-01 NOTE — Progress Notes (Signed)
No we can hold off on culture.  The nitrites were negative which is reassuring.

## 2022-12-03 ENCOUNTER — Ambulatory Visit (INDEPENDENT_AMBULATORY_CARE_PROVIDER_SITE_OTHER): Payer: Medicare Other | Admitting: Family Medicine

## 2022-12-03 ENCOUNTER — Encounter: Payer: Self-pay | Admitting: Family Medicine

## 2022-12-03 VITALS — BP 119/61 | HR 67 | Ht 61.0 in | Wt 178.0 lb

## 2022-12-03 DIAGNOSIS — M7989 Other specified soft tissue disorders: Secondary | ICD-10-CM | POA: Diagnosis not present

## 2022-12-03 DIAGNOSIS — H6122 Impacted cerumen, left ear: Secondary | ICD-10-CM

## 2022-12-03 DIAGNOSIS — I890 Lymphedema, not elsewhere classified: Secondary | ICD-10-CM | POA: Diagnosis not present

## 2022-12-03 DIAGNOSIS — Z79899 Other long term (current) drug therapy: Secondary | ICD-10-CM | POA: Diagnosis not present

## 2022-12-03 NOTE — Assessment & Plan Note (Signed)
She does not get significant improvement in the edema with diuresis then consider referral back to lymphedema clinic for treatment.

## 2022-12-03 NOTE — Progress Notes (Signed)
Pt reports that her feet are still swollen.

## 2022-12-03 NOTE — Progress Notes (Signed)
Established Patient Office Visit  Subjective   Patient ID: Joann Maddox, female    DOB: 05-02-1940  Age: 82 y.o. MRN: [id number]  Chief Complaint  Patient presents with   Edema    HPI  She is a couple of concerns today she is here today to follow-up on lower extremity swelling.  She has been taking the furosemide since Sunday so about 5 days in a row in the morning she has noted some increased urination with that.  She does not feel like she really intakes fluids.  If anything she tends to not drink a lot.  She reports that she is otherwise tolerated the medication well.  Her weight is down 2 pounds today.  She does normally wear her compression stockings but did not have them on today so that we can take a look at her ankles.  Is also been trying to keep her feet elevated during the day.  She would like to also have her ears checked for wax that is blocking the membrane.    ROS    Objective:     BP 119/61   Pulse 67   Ht 5\' 1"  (1.549 m)   Wt 178 lb (80.7 kg)   SpO2 96%   BMI 33.63 kg/m    Physical Exam Vitals reviewed.  Constitutional:      Appearance: She is well-developed.  HENT:     Head: Normocephalic and atraumatic.     Comments: Small amt of wax our of the right.  Moderate amt of wax on the left canal blocking the TM  Eyes:     Conjunctiva/sclera: Conjunctivae normal.  Cardiovascular:     Rate and Rhythm: Normal rate.  Pulmonary:     Effort: Pulmonary effort is normal.  Skin:    General: Skin is dry.     Coloration: Skin is not pale.     Comments: 1+ pitting edema around both ankles up to mid tibia bilaterally.  Neurological:     Mental Status: She is alert and oriented to person, place, and time.  Psychiatric:        Behavior: Behavior normal.     No results found for any visits on 12/03/22.    The ASCVD Risk score (Arnett DK, et al., 2019) failed to calculate for the following reasons:   The 2019 ASCVD risk score is only valid for ages 64 to  66    Assessment & Plan:   Problem List Items Addressed This Visit       Other   Lymph edema - Primary    She does not get significant improvement in the edema with diuresis then consider referral back to lymphedema clinic for treatment.      Other Visit Diagnoses     Hearing loss due to cerumen impaction, left       Localized swelling of both lower extremities       Relevant Orders   Basic Metabolic Panel (BMET)   Medication management          Lower extremity swelling-we did discuss getting a BMP today as long as renal function potassium look good we can try increasing the Lasix to 2 tabs.  Will likely need to add a potassium tab to do that.  It does sound like she was getting some diuresis so we did discuss not overindulging in fluids to undo the medication effect.  She is down 2 pounds even though she has not noticed a lot of improvement  in her legs.  She also has a history of lymphedema which could be contributing to some of the chronic swelling.  Cerumen impaction of the left ear-irrigation performed.  Patient tolerated fairly well.  Indication: Cerumen impaction of the ear(s) Medical necessity statement: On physical examination, cerumen impairs clinically significant portions of the external auditory canal, and tympanic membrane. Noted obstructive, copious cerumen that cannot be removed without magnification.  Consent: Discussed benefits and risks of procedure and verbal consent obtained Procedure: Patient was prepped for the procedure. Utilized an otoscope to assess and take note of the ear canal, the tympanic membrane, and the presence, amount, and placement of the cerumen. Gentle water irrigation and soft plastic curette was utilized to remove cerumen.  Post procedure examination: shows cerumen was completely removed. Patient tolerated procedure well. The patient is made aware that they may experience temporary vertigo, temporary hearing loss, and temporary discomfort. If  these symptom last for more than 24 hours to call the clinic or proceed to the ED.     No follow-ups on file.    Nani Gasser, MD

## 2022-12-03 NOTE — Patient Instructions (Signed)
Debrox drops can be used to help slowly melt the wax out of your ears.   If labs are OK, we will increase the furosemide to 2 tabs in AM

## 2022-12-04 NOTE — Progress Notes (Signed)
Your lab work is within acceptable range and there are no concerning findings.   ?

## 2022-12-05 ENCOUNTER — Other Ambulatory Visit: Payer: Self-pay | Admitting: Family Medicine

## 2022-12-05 DIAGNOSIS — R600 Localized edema: Secondary | ICD-10-CM

## 2022-12-13 ENCOUNTER — Other Ambulatory Visit: Payer: Self-pay | Admitting: Family Medicine

## 2022-12-13 DIAGNOSIS — R600 Localized edema: Secondary | ICD-10-CM

## 2022-12-29 ENCOUNTER — Telehealth: Payer: Self-pay | Admitting: Family Medicine

## 2022-12-29 ENCOUNTER — Ambulatory Visit (INDEPENDENT_AMBULATORY_CARE_PROVIDER_SITE_OTHER): Payer: Medicare Other | Admitting: Family Medicine

## 2022-12-29 DIAGNOSIS — R600 Localized edema: Secondary | ICD-10-CM | POA: Diagnosis not present

## 2022-12-29 DIAGNOSIS — R413 Other amnesia: Secondary | ICD-10-CM

## 2022-12-29 DIAGNOSIS — Z23 Encounter for immunization: Secondary | ICD-10-CM

## 2022-12-29 DIAGNOSIS — M19012 Primary osteoarthritis, left shoulder: Secondary | ICD-10-CM

## 2022-12-29 DIAGNOSIS — F02A0 Dementia in other diseases classified elsewhere, mild, without behavioral disturbance, psychotic disturbance, mood disturbance, and anxiety: Secondary | ICD-10-CM

## 2022-12-29 DIAGNOSIS — I10 Essential (primary) hypertension: Secondary | ICD-10-CM

## 2022-12-29 DIAGNOSIS — G301 Alzheimer's disease with late onset: Secondary | ICD-10-CM

## 2022-12-29 DIAGNOSIS — I890 Lymphedema, not elsewhere classified: Secondary | ICD-10-CM

## 2022-12-29 MED ORDER — FUROSEMIDE 20 MG PO TABS
20.0000 mg | ORAL_TABLET | Freq: Every morning | ORAL | 1 refills | Status: DC
Start: 2022-12-29 — End: 2023-06-25

## 2022-12-29 MED ORDER — TRAMADOL HCL 50 MG PO TABS: ORAL_TABLET | ORAL | 1 refills | Status: DC

## 2022-12-29 MED ORDER — RIVASTIGMINE 9.5 MG/24HR TD PT24
9.5000 mg | MEDICATED_PATCH | Freq: Every day | TRANSDERMAL | 1 refills | Status: DC
Start: 2022-12-29 — End: 2023-08-20

## 2022-12-29 NOTE — Assessment & Plan Note (Signed)
Bp looks awesome today.

## 2022-12-29 NOTE — Patient Instructions (Signed)
I am going to increase the memory patch dose.   Refilled the furosemide to help with swelling

## 2022-12-29 NOTE — Assessment & Plan Note (Signed)
Wears her compression stocking normally. She says the diuretic was only minimally helpful.

## 2022-12-29 NOTE — Progress Notes (Signed)
Established Patient Office Visit  Subjective   Patient ID: Joann Maddox, female    DOB: Jan 01, 1941  Age: 82 y.o. MRN: [id number]  Chief Complaint  Patient presents with   Hypertension         HPI  F/U Memory Impairment -  her husband reports increased confusion. Has been napping more often.  Brain MRI 09/07/22.  ON low dose Exelon patch.  Husband reports waking up from dreams and naps and feeling very confused.  Getting more forgetful about taking her medication.  Her husband has been setting out less at 1 time so that she can be more consistent with them.  Losing her cane regular. She has multiple.  Spine was wondering if there is anything that she is taking that could be affecting her mentation.  Also wanted to discuss the best way to take her medications for pain particularly regarding her shoulder.  When to take the meloxicam and when to take the tramadol.    ROS    Objective:     There were no vitals taken for this visit.   Physical Exam Vitals and nursing note reviewed.  Constitutional:      Appearance: Normal appearance.  HENT:     Head: Normocephalic and atraumatic.  Eyes:     Conjunctiva/sclera: Conjunctivae normal.  Cardiovascular:     Rate and Rhythm: Normal rate and regular rhythm.  Pulmonary:     Effort: Pulmonary effort is normal.     Breath sounds: Normal breath sounds.  Skin:    General: Skin is warm and dry.  Neurological:     Mental Status: She is alert.  Psychiatric:        Mood and Affect: Mood normal.      No results found for any visits on 12/29/22.    The ASCVD Risk score (Arnett DK, et al., 2019) failed to calculate for the following reasons:   The 2019 ASCVD risk score is only valid for ages 84 to 26    Assessment & Plan:   Problem List Items Addressed This Visit       Cardiovascular and Mediastinum   Essential hypertension, benign - Primary    Bp looks awesome today!!!       Relevant Medications   furosemide (LASIX) 20  MG tablet     Nervous and Auditory   Mild dementia (HCC)    Repeat MOCA Today 08/27/22: MoCA: 18/30  12/29/2022: 17/30 Will increase Exelon dose of patch  Would like to refer to Neurology for assistance since decline in her memory and possible hallucinations.   Ok to hold statin x 2 weeks to see if any improvement in memory.  If not change the ok to restart.        Relevant Medications   rivastigmine (EXELON) 9.5 mg/24hr     Musculoskeletal and Integument   Primary osteoarthritis of left shoulder    Recommend use ner meloxicam first and tramadol second for pain. Tramadol can cause MS changes.       Relevant Medications   traMADol (ULTRAM) 50 MG tablet     Other   Lymphatic edema    Wears her compression stocking normally. She says the diuretic was only minimally helpful.       Relevant Orders   Ambulatory referral to Physical Therapy   Other Visit Diagnoses     Encounter for immunization       Relevant Orders   Flu Vaccine Trivalent High Dose (Fluad) (Completed)  Lower extremity edema       Relevant Medications   furosemide (LASIX) 20 MG tablet   Memory impairment       Relevant Orders   Ambulatory referral to Neurology       Return in about 3 months (around 03/30/2023) for bp.   I spent 40 minutes on the day of the encounter to include pre-visit record review, face-to-face time with the patient and post visit ordering of test.   Nani Gasser, MD

## 2022-12-29 NOTE — Telephone Encounter (Signed)
Ok to hold statin x 2 weeks to see if any improvement in memory.  If not change the ok to restart.

## 2022-12-29 NOTE — Assessment & Plan Note (Addendum)
Repeat MOCA Today 08/27/22: MoCA: 18/30  12/29/2022: 17/30 Will increase Exelon dose of patch  Would like to refer to Neurology for assistance since decline in her memory and possible hallucinations.   Ok to hold statin x 2 weeks to see if any improvement in memory.  If not change the ok to restart.

## 2022-12-29 NOTE — Assessment & Plan Note (Signed)
Recommend use ner meloxicam first and tramadol second for pain. Tramadol can cause MS changes.

## 2022-12-30 NOTE — Telephone Encounter (Signed)
Patient's husband advised 

## 2023-01-26 ENCOUNTER — Encounter: Payer: Self-pay | Admitting: Occupational Therapy

## 2023-01-26 ENCOUNTER — Ambulatory Visit: Payer: Medicare Other | Attending: Family Medicine | Admitting: Occupational Therapy

## 2023-01-26 DIAGNOSIS — I890 Lymphedema, not elsewhere classified: Secondary | ICD-10-CM | POA: Insufficient documentation

## 2023-01-26 NOTE — Therapy (Signed)
OUTPATIENT PHYSICAL THERAPY EVALUATION  LOWER EXTREMITY LYMPHEDEMA  Patient Name: Joann Maddox MRN: [id number] DOB:09/06/1940, 82 y.o., female Today's Date: 01/26/2023  END OF SESSION:   Past Medical History:  Diagnosis Date   Allergy    Anxiety    Arthritis    Cataract    Depression    GERD (gastroesophageal reflux disease)    Headache(784.0)    migrains   Heart murmur    Hypothyroidism    Pre-diabetes    Rheumatoid arthritis(714.0)    Past Surgical History:  Procedure Laterality Date   ABDOMINAL HYSTERECTOMY     ANTERIOR CERVICAL DECOMP/DISCECTOMY FUSION N/A 01/03/2018   Procedure: ANTERIOR CERVICAL DECOMPRESSION/DISCECTOMY FUSION, ALLOGRAFT, PLATE;  Surgeon: Eldred Manges, MD;  Location: [facility] OR;  Service: Orthopedics;  Laterality: N/A;   APPENDECTOMY     CHOLECYSTECTOMY     JOINT REPLACEMENT     Both hips and both knees   REPLACEMENT TOTAL KNEE BILATERAL     SPINE SURGERY     TONSILLECTOMY     as  a child   TOTAL HIP ARTHROPLASTY     right and left   Patient Active Problem List   Diagnosis Date Noted   Unsteady gait 04/03/2022   Bilateral hearing loss 03/12/2022   Urinary incontinence 12/25/2021   Mild dementia (HCC) 06/24/2021   Coccydynia 12/23/2020   Gastroesophageal reflux disease with esophagitis without hemorrhage 04/05/2020   Iron deficiency anemia 06/22/2019   Lymphatic edema 12/06/2018   Sleep disturbance 12/05/2018   Venous stasis dermatitis of both lower extremities 09/20/2018   Varicose veins of both lower extremities 08/22/2018   Depression, major, single episode, mild (HCC) 08/22/2018   Falls frequently 08/12/2018   Mass of thigh, right 05/17/2018   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   Status post cervical spinal fusion 01/11/2018   Chronic neck pain 10/20/2017   Chronic venous insufficiency 04/29/2017   IFG (impaired fasting glucose) 05/09/2014   Chronic constipation 02/19/2014   Rosacea 11/06/2013   GAD (generalized  anxiety disorder) 06/08/2013   Essential hypertension, benign 05/04/2013   Migraine with aura 05/04/2013   Murmur, heart 05/04/2013   Hyperlipidemia 05/04/2013   Subclinical hyperthyroidism 05/04/2013   Fatigue 02/16/2012   Mixed stress and urge urinary incontinence 02/16/2012   Hyperglycemia 01/12/2012    PCP: Agapito Games, MD  REFERRING PROVIDER: same  REFERRING DIAG: I89.0  THERAPY DIAG:  Lymphedema, not elsewhere classified  Rationale for Evaluation and Treatment:   ONSET DATE: "years ago", insideous onset"   SUBJECTIVE:  SUBJECTIVE STATEMENT:  PERTINENT HISTORY:   PAIN:  Are you having pain? Yes: NPRS scale: 6/10 Pain location: B legs Pain description: heavy, full, tight, hard Aggravating factors: standing, sitting in dependent position Relieving factors: leg elevation  PRECAUTIONS: {Therapy precautions:24002}  RED FLAGS: None   WEIGHT BEARING RESTRICTIONS: No  FALLS:  Has patient fallen in last 6 months? Yes. Number of falls 3  LIVING ENVIRONMENT: Lives with: lives with their spouse Lives in: House/apartment; level entry, 1st apartment Stairs: No;  Has following equipment at home: Environmental consultant - 4 wheeled, Wheelchair (power), [employer], and hand held shower, multiple grab bars  OCCUPATION: ***  LEISURE: ***  HAND DOMINANCE: {RIGHT/LEFT:21944}   PRIOR LEVEL OF FUNCTION: {PLOF:24004}  PATIENT GOALS: ***   OBJECTIVE: Note: Objective measures were completed at Evaluation unless otherwise noted.  COGNITION:  Overall cognitive status: {cognition:24006}   PALPATION: ***  OBSERVATIONS / OTHER ASSESSMENTS: ***  SENSATION: {sensation:27233}  POSTURE: ***  LE ROM:   {AROM/PROM:27142}  Right 01/26/2023 LEFT 01/26/2023  Hip flexion    Hip extension     Hip abduction    Hip adduction    Hip internal rotation    Hip external rotation    Knee flexion    Knee extension    Ankle dorsiflexion    Ankle plantarflexion    Ankle inversion    Ankle eversion     (Blank rows = not tested)  LE MMT:   MMT Right 01/26/2023 Left 01/26/2023  Hip flexion    Hip extension    Hip abduction    Hip adduction    Hip internal rotation    Hip external rotation    Knee flexion    Knee extension    Ankle dorsiflexion    Ankle plantarflexion    Ankle inversion    Ankle eversion     (Blank rows = not tested)  LYMPHEDEMA ASSESSMENTS:   SURGERY TYPE/DATE: ***  NUMBER OF LYMPH NODES REMOVED: ***  CHEMOTHERAPY: ***  RADIATION:***  HORMONE TREATMENT: ***  INFECTIONS: ***   LYMPHEDEMA ASSESSMENTS:   LANDMARK RIGHT  01/26/2023  10 cm proximal to olecranon process   Olecranon process   10 cm proximal to ulnar styloid process   Just proximal to ulnar styloid process   Across hand at thumb web space   At base of 2nd digit   (Blank rows = not tested)  LANDMARK LEFT  01/26/2023  10 cm proximal to olecranon process   Olecranon process   10 cm proximal to ulnar styloid process   Just proximal to ulnar styloid process   Across hand at thumb web space   At base of 2nd digit   (Blank rows = not tested)   LE LANDMARK RIGHT 01/26/2023  At groin   30 cm proximal to suprapatella   20 cm proximal to suprapatella   10 cm proximal to suprapatella   At midpatella / popliteal crease   30 cm proximal to floor at lateral plantar foot   20 cm proximal to floor at lateral plantar foot   10 cm proximal to floor at lateral plantar foot   Circumference of ankle/heel   5 cm proximal to 1st MTP joint   Across MTP joint   Around proximal great toe   (Blank rows = not tested)  LE LANDMARK LEFT 01/26/2023  At groin   30 cm proximal to suprapatella   20 cm proximal to suprapatella   10 cm proximal to suprapatella   At midpatella /  popliteal crease    30 cm proximal to floor at lateral plantar foot   20 cm proximal to floor at lateral plantar foot   10 cm proximal to floor at lateral plantar foot   Circumference of ankle/heel   5 cm proximal to 1st MTP joint   Across MTP joint   Around proximal great toe   (Blank rows = not tested)  FUNCTIONAL TESTS:  {Functional tests:24029}  GAIT: Distance walked: *** Assistive device utilized: {Assistive devices:23999} Level of assistance: {Levels of assistance:24026} Comments: ***  LYMPHEDEMA LIFE IMPACT SCALE (LLIS):  I. Physical Concerns The amount of pain associated with my lymphedema is: {LLISSELECTION:27229} The amount of limb heaviness associated with my lymphedema is: {LLISSELECTION:27229} The amount of skin tightness associated with my lymphedema is: {LLISSELECTION:27229} In comparison to my unaffected limb, the size of my swollen limb seems: {LLISSELECTION:27229} In comparison to my unaffected limb, the texture of my swollen limb feels: {LLISSELECTION:27229} Lymphedema affects movement of my swollen limb: {LLISSELECTION:27229} The strength in my swollen limb compared with the unaffected limb is: {LLISSELECTION:27229} How often have you become ill wit an infection in your swollen limb requiring oral antibiotics or hospitalization in the past 2 YEARS? {LLISSELECTION:27229}  II. Psychosocial Concerns 9.   Lymphedema affects my body image (ie. "How I think I look"): {LLISSELECTION:27229} 10. Lymphedema affects my socializing with others: {LLISSELECTION:27229} 11. Lymphedema affects my intimate relations: {LLISSELECTION:27229} 12. Lymphedema "gets me down" (ie. I have feelings of depression, frustration, or anger due to the lymphedema): {LLISSELECTION:27229}  III. Functional Concerns 13. Lymphedema affects my ability to perform duties at home: {LLISSELECTION:27229} 14. Lymphedema affects my ability to perform dueites at work (if applicable): {LLISSELECTION:27229} 15. Lymphedema  affects my performance of preferred recreational activities: {LLISSELECTION:27229} 16. Lymphedema affects the proper fit of clothing/shoes: {LLISSELECTION:27229} 17. Lymphedema affects my sleep: {LLISSELECTION:27229} 18. I must rely on others for help due to my lymphedema: {LLISSELECTION:27229}  TODAY'S TREATMENT:                                                                                                                                         DATE: ***  PATIENT EDUCATION:  Education details: *** Person educated: {Person educated:25204} Education method: {Education Method:25205} Education comprehension: {Education Comprehension:25206}  HOME EXERCISE PROGRAM: ***  ASSESSMENT:  CLINICAL IMPRESSION: Patient is a *** y.o. *** who was seen today for physical therapy evaluation and treatment for ***.   OBJECTIVE IMPAIRMENTS: {opptimpairments:25111}.   ACTIVITY LIMITATIONS: {activitylimitations:27494}  PARTICIPATION LIMITATIONS: {participationrestrictions:25113}  PERSONAL FACTORS: {Personal factors:25162} are also affecting patient's functional outcome.   REHAB POTENTIAL: {rehabpotential:25112}  CLINICAL DECISION MAKING: {clinical decision making:25114}  EVALUATION COMPLEXITY: {Evaluation complexity:25115}   GOALS: Goals reviewed with patient? {yes/no:20286}  SHORT TERM GOALS: Target date: ***  *** Baseline: Goal status: INITIAL  2.  *** Baseline:  Goal status: INITIAL  3.  *** Baseline:  Goal status: INITIAL  4.  ***  Baseline:  Goal status: INITIAL  5.  *** Baseline:  Goal status: INITIAL  6.  *** Baseline:  Goal status: INITIAL  LONG TERM GOALS: Target date: ***  *** Baseline:  Goal status: INITIAL  2.  *** Baseline:  Goal status: INITIAL  3.  *** Baseline:  Goal status: INITIAL  4.  *** Baseline:  Goal status: INITIAL  5.  *** Baseline:  Goal status: INITIAL  6.  *** Baseline:  Goal status: INITIAL   PLAN:  PT FREQUENCY:  {rehab frequency:25116}  PT DURATION: {rehab duration:25117}  PLANNED INTERVENTIONS: {rehab planned interventions:25118::"Therapeutic exercises","Therapeutic activity","Neuromuscular re-education","Balance training","Gait training","Patient/Family education","Self Care","Joint mobilization"}  PLAN FOR NEXT SESSION: ***  Loel Dubonnet, MS, OTR/L, CLT-LANA 01/26/23 10:43 AM

## 2023-01-27 ENCOUNTER — Ambulatory Visit: Payer: Medicare Other | Admitting: Occupational Therapy

## 2023-01-27 ENCOUNTER — Encounter: Payer: Self-pay | Admitting: Occupational Therapy

## 2023-01-27 DIAGNOSIS — I890 Lymphedema, not elsewhere classified: Secondary | ICD-10-CM | POA: Diagnosis not present

## 2023-01-28 NOTE — Therapy (Signed)
OUTPATIENT OCCUPATIONAL THERAPY TREATMENT NOTE  BILATERAL LOWER EXTREMITY LYMPHEDEMA  Patient Name: Joann Maddox MRN: [id number] DOB:1940/05/03, 82 y.o., female Today's Date: 01/28/2023  END OF SESSION:   OT End of Session - 01/26/23 1028     Visit Number 1    Number of Visits 36    Date for OT Re-Evaluation 04/26/23    OT Start Time 1013    OT Stop Time 1115    OT Time Calculation (min) 62 min    Activity Tolerance Patient tolerated treatment well;No increased pain   Pt limited by dementia   Behavior During Therapy WFL for tasks assessed/performed               Past Medical History:  Diagnosis Date   Allergy    Anxiety    Arthritis    Cataract    Depression    GERD (gastroesophageal reflux disease)    Headache(784.0)    migrains   Heart murmur    Hypothyroidism    Pre-diabetes    Rheumatoid arthritis(714.0)    Past Surgical History:  Procedure Laterality Date   ABDOMINAL HYSTERECTOMY     ANTERIOR CERVICAL DECOMP/DISCECTOMY FUSION N/A 01/03/2018   Procedure: ANTERIOR CERVICAL DECOMPRESSION/DISCECTOMY FUSION, ALLOGRAFT, PLATE;  Surgeon: Eldred Manges, MD;  Location: [facility] OR;  Service: Orthopedics;  Laterality: N/A;   APPENDECTOMY     CHOLECYSTECTOMY     JOINT REPLACEMENT     Both hips and both knees   REPLACEMENT TOTAL KNEE BILATERAL     SPINE SURGERY     TONSILLECTOMY     as  a child   TOTAL HIP ARTHROPLASTY     right and left   Patient Active Problem List   Diagnosis Date Noted   Unsteady gait 04/03/2022   Bilateral hearing loss 03/12/2022   Urinary incontinence 12/25/2021   Mild dementia (HCC) 06/24/2021   Coccydynia 12/23/2020   Gastroesophageal reflux disease with esophagitis without hemorrhage 04/05/2020   Iron deficiency anemia 06/22/2019   Lymphatic edema 12/06/2018   Sleep disturbance 12/05/2018   Venous stasis dermatitis of both lower extremities 09/20/2018   Varicose veins of both lower extremities 08/22/2018   Depression, major, single  episode, mild (HCC) 08/22/2018   Falls frequently 08/12/2018   Mass of thigh, right 05/17/2018   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   Status post cervical spinal fusion 01/11/2018   Chronic neck pain 10/20/2017   Chronic venous insufficiency 04/29/2017   IFG (impaired fasting glucose) 05/09/2014   Chronic constipation 02/19/2014   Rosacea 11/06/2013   GAD (generalized anxiety disorder) 06/08/2013   Essential hypertension, benign 05/04/2013   Migraine with aura 05/04/2013   Murmur, heart 05/04/2013   Hyperlipidemia 05/04/2013   Subclinical hyperthyroidism 05/04/2013   Fatigue 02/16/2012   Mixed stress and urge urinary incontinence 02/16/2012   Hyperglycemia 01/12/2012    PCP: Agapito Games, MD  REFERRING PROVIDER: same  REFERRING DIAG: I89.0  THERAPY DIAG:  Lymphedema, not elsewhere classified  Rationale for Evaluation and Treatment:   ONSET DATE: "years ago", insidious onset"   SUBJECTIVE:  SUBJECTIVE STATEMENT: Joann Maddox present to OT for treatment of BLE lymphedema seated in a transport wheelchair. Her spouse, Dorene Sorrow, transports her to clinic. Pt required Min A and extra time to complete transfer from wc to Rx bed. Pt rates LE-related pain/ discomfort in her legs as 6/10 today. Pt is drowsy off and on throughout session. Her spouse ids a good historian on her behalf today.   PERTINENT HISTORY: HTN, varicose veins, CVI, venous stasis dermatitis, sleep disturbance, B THA, B TKA, OA, RA, HYPOTHYROIDISM, GAD, Frequent Falls, Mild dementia, urinary incontinence, unsteady gait, B hearing loss  PAIN:  Are you having pain? Yes: NPRS scale: 6/10 Pain location: B legs Pain description: heavy, full, tight, hard Aggravating factors: standing, sitting in dependent  position Relieving factors: leg elevation  PRECAUTIONS: Other: HYPOTHYROIDISM  WEIGHT BEARING RESTRICTIONS: No  FALLS:  Has patient fallen in last 6 months? Yes. Number of falls 3  LIVING ENVIRONMENT: Lives with: lives with their spouse Lives in: House/apartment; level entry, 1st floor apartment Stairs: No;  Has following equipment at home: Environmental consultant - 4 wheeled, Wheelchair (power), Tour manager, and hand held shower, multiple grab bars  OCCUPATION: retired Psychologist, forensic  HAND DOMINANCE: right   PRIOR LEVEL OF FUNCTION: Requires assistive device for independence, Needs assistance with ADLs, Needs assistance with homemaking, Needs assistance with gait, and Needs assistance with transfers  PATIENT GOALS: see SUBJECTIVE   OBJECTIVE: Note: Objective measures were completed at Evaluation unless otherwise noted.   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 (exacerbated by OA and RA)  Skin  Description Hyper-Keratosis Peau D' Orange Shiny Tight Fibrotic/ Indurated Fatty Doughy Spongy/ boggy     x x L>R   x   Skin dry Flaky WNL Macerated   mild x     Color Redness Varicosities Blanching Hemosiderin Stain Mottled   x x    x   Odor Malodorous Yeast Fungal infection  WNL      x   Temperature Warm Cool wnl    x     Pitting Edema   1+ 2+ 3+ 4+ Non-pitting     x       Girth Symmetrical Asymmetrical                   Distribution    L>R toes to popliteal fossa    Stemmer Sign Positive Negative   +    Lymphorrhea History Of:  Present Absent     x    Wounds History Of Present Absent Venous Arterial Pressure Sheer     x        Signs of Infection Redness Warmth Erythema Acute Swelling Drainage Borders                    Sensation Light Touch Deep pressure Hypersensitivity   In tact Impaired Present Impaired Absent Impaired   x  x  x     Nails WNL   Fungus nail dystrophy    x    Hair Growth  Symmetrical Asymmetrical   x    Skin Creases Base of toes  Ankles   Base of Fingers knees       Abdominal pannus Thigh Lobules  Face/neck   x x  x       BLE COMPARATIVE LIMB VOLUMETRICS:   INITIAL:  01/27/23 Rx      visit 1  LANDMARK RIGHT  (dominant)  R  LEG (A-D) 3238.7 ml  R THIGH (E-G) ml  R FULL LIMB (A-G) ml  Limb Volume differential (LVD)  %  Volume change since initial %  Volume change overall V  (Blank rows = not tested)  LANDMARK LEFT    L LEG (A-D) 3643.7 ml  L THIGH (E-G) ml  L FULL LIMB (A-G) ml  Limb Volume differential (LVD)  12.5%, L>R  Volume change since initial %  Volume change overall %  (Blank rows = not tested)   LYMPHEDEMA LIFE IMPACT SCALE (LLIS): INITIAL 01/26/23: 60.29 % (the extent to which lymphedema -related problems affected your life in the last week)  FOTO functional outcome scale:  TBA  TODAY'S TREATMENT:                                                                                                                                         Initial BLE comparative limb volumetrics Initial multilayered compression bandaging to LLE- knee high Pt and Caregiver edu for LE self care  PATIENT EDUCATION:  Continued Pt/ CG edu for lymphedema self care home program throughout session. Topics include outcome of comparative limb volumetrics- starting limb volume differentials (LVDs), technology and gradient techniques used for short stretch, multilayer compression wrapping, simple self-MLD, therapeutic lymphatic pumping exercises, skin/nail care, LE precautions,. compression garment recommendations and specifications, wear and care schedule and compression garment donning / doffing w assistive devices. Discussed progress towards all OT goals since commencing CDT. All questions answered to the Pt's satisfaction. Good return. Person educated: Patient and spouse Education method: Explanation, Demonstration, and Handouts Education comprehension: verbalized  understanding, returned demonstration, verbal cues required, and needs further education  HOME EXERCISE PROGRAM: BLE lymphatic pumping there ex- 1 set of 10 each element, in order. Hold 5 2. Daily compression- thigh length multilayer compression bandages during Intensive Phase CDT; During self-Management Phase appropriate thigh high compression stockings paired with compression biker shorts (off the shelf or medical grade TBD) 3. Daily skin care with low ph lotion matching skin ph 4. Daily simple self MLD  Custom-made gradient compression garments and HOS devices are medically necessary in this case because they are uniquely sized and shaped to fit the exact dimensions of the affected extremities, and to provide accurate and consistent gradient compression essential for optimally managing this patient's symptoms of chronic, progressive lymphedema. Multiple custom compression garments are needed for optimal hygiene to limit infection risk. Custom compression garments should be replaced q 3-6 months When worn consistently for optimal lipo-lymphedema self-management over time.   ASSESSMENT:  CLINICAL IMPRESSION: Initial cmparative limb volumetrics reveal a 12. 5% limb volume differential with non-dominant L Leg > than dominant R leg. Pt complains about swelling and lymphadema related discomfort in both legs. Pt tolerated initial multilayer compression wraps applied to LLE in clinic    from toes to popliteal fossa. She dozed through much of this section of session.  Spouse asked good questions and was fully engaged in learning gradient techniques.Pt was unable to wear her existing shoes   with compression wraps. Spouse will bring alternative lace up shoes next session.Cont as per POC.    INITIAL EVAL 01/26/23:  Avyn Aden is an 38 y o female presenting with mild, stage II BLE lymphedema, L>R 2/2 CVI and varicose veins. Her condition is exacerbated by dependent positioning most of the day. Pt presents  with L>R leg swelling, positive Stemmer sign and lymphedema associated skin changes which have worsened over time. Swelling fluctuates some , but never resolves. Existing compression garments are inadequate for controlling swelling.   BLE lymphedema and associated pain limits Pt's functional performance in all occupational domains, including functional ambulation and mobility, basic and instrumental ADLs, productive activities and leisure pursuits, social participation, role performance,  and contributes to decreased body image Pt will benefit from skilled Occupational Therapy for Intensive and Self -Management Phases of Complete Decongestive Therapy (CDT) to reduce and control leg swelling and associated pain and to limit lymphedema progression. CDT 2 x weekly will include compression wrapping, skin care, manual lymphatic drainage, therapeutic exercise and behavior modification for elevation. Pt's prognosis is dependent on consistent daily caregiver help with all lymphedema self-care components, including compression wrapping and garments as treatment course transitions to self care at home. Caregiver will  receive LE self-care training for LE self-management, and support going forward PRN. Lymphedema is a chronic, progressive condition with no cure. With skilled OT intervention symptoms will reduce apt will become independent with self-management. Without skilled OT for CDT the condition will progress and further functional decline is expected.   OBJECTIVE IMPAIRMENTS: Abnormal gait, decreased activity tolerance, decreased balance, decreased cognition, decreased knowledge of condition, decreased knowledge of use of DME, decreased mobility, difficulty walking, decreased strength, increased edema, postural dysfunction, pain, and chronic, progressive, painful leg swelling .   ACTIVITY LIMITATIONS: carrying, lifting, bending, sitting, standing, squatting, stairs, transfers, continence, bathing, toileting,  dressing, hygiene/grooming, and caring for others  PARTICIPATION LIMITATIONS: meal prep, cleaning, laundry, medication management, personal finances, driving, shopping, community activity, occupation, and yard work  PERSONAL FACTORS: Age, Fitness, Past/current experiences, Time since onset of injury/illness/exacerbation, and 1 comorbidity: HTN, OA, RA  are also affecting patient's functional outcome.   REHAB POTENTIAL: Good  CLINICAL DECISION MAKING: Stable/uncomplicated  EVALUATION COMPLEXITY: Moderate  GOALS: Goals reviewed with patient? Yes   SHORT TERM GOALS: Target date: 4th OT Rx visit    Pt will demonstrate understanding of lymphedema precautions and prevention strategies with maximum assistance from caregiver  using a printed reference to identify at least 5 precautions and discussing how s/he may implement them into daily life to reduce risk of progression with modified assistance Baseline: Dependent Goal status: INITIAL   2.  With Max caregiver assistance Pt will be able to apply multilayer, knee length, compression wraps using gradient techniques to decrease limb volume, to limit infection risk, and to limit lymphedema progression.  Given this patient's Intake score of tbd % on the Lymphedema Life Impact Scale (LLIS), patient will experience a reduction of at least 5% in her perceived level of functional impairment resulting from lymphedema to improve functional performance and quality of life (QOL). Baseline: Dependent Goal status: INITIAL     LONG TERM GOALS: Target date: 08/09/22 (12 WEEKS)     Given this patient's Intake score of 60.29 % on the Lymphedema Life Impact Scale (LLIS), patient will experience a reduction of at least 5% in her perceived  level of functional impairment resulting from lymphedema to improve functional performance and quality of life (QOL).Baseline: tbd Baseline: max a Goal status: INITIAL   2.  Given this patient's Intake score of TBA/100% on the  functional outcomes FOTO tool, patient will experience an increase in function of 5 points to improve basic and instrumental ADLs performance, including lymphedema self-care. (TBA at first OT Rx visit) Baseline: Dependent Goal status: INITIAL   3.  With max caregiver assistance and assistive devices Pt will be able to don and doff appropriate compression garments and/or devices to control BLE lymphedema and to limit progression.  Baseline: Dependent Goal status: INITIAL   4. With max assistance Pt will achieve at least a 10% volume reductions bilaterally below the knees to return limb to more typical size and shape, to limit infection risk and LE progression, to decrease pain, to improve function, and to improve body image and QOL. Baseline: Dependent Goal status: INITIAL   5. Pt will achieve and sustain at least 85% attendance at OT sessions, and with compliance with all LE self-care home program components throughout CDT, including modified simple self-MLD, daily skin care and inspection, lymphatic pumping the ex and appropriate compression to limit lymphedema progression and to limit further functional decline. Baseline: Dependent Goal status: INITIAL      PLAN:  OT FREQUENCY: 2x/week  OT DURATION: 12 weeks  PLANNED INTERVENTIONS: Therapeutic exercises, Therapeutic activity, Patient/Family education, Self Care, DME instructions, Manual lymph drainage, Compression bandaging, scar mobilization, Manual therapy, and skin care throughout manual therapy to reduce infection risk; fit with appropriate compression garments and/ or devices, caregiver training for assistance with all aspects of LE self care  PLAN FOR NEXT SESSION:  Multilayer compression wraps to LLE- toes to popliteal fossa Pt and family edu for gradient compression wrapping  Loel Dubonnet, MS, OTR/L, CLT-LANA 01/28/23 12:34 PM

## 2023-02-01 ENCOUNTER — Ambulatory Visit: Payer: Medicare Other | Admitting: Occupational Therapy

## 2023-02-01 ENCOUNTER — Encounter: Payer: Self-pay | Admitting: Occupational Therapy

## 2023-02-01 DIAGNOSIS — I890 Lymphedema, not elsewhere classified: Secondary | ICD-10-CM

## 2023-02-01 NOTE — Therapy (Signed)
OUTPATIENT OCCUPATIONAL THERAPY TREATMENT NOTE  BILATERAL LOWER EXTREMITY LYMPHEDEMA  Patient Name: Joann Maddox MRN: [id number] DOB:[birth year], 82 y.o., female Today's Date: 02/01/2023  END OF SESSION:   OT End of Session - 02/01/23 1106     Visit Number 3    Number of Visits 36    Date for OT Re-Evaluation 04/26/23    OT Start Time 1107    OT Stop Time 1217    OT Time Calculation (min) 70 min    Activity Tolerance Patient tolerated treatment well;No increased pain   Pt limited by dimentia   Behavior During Therapy [facility] for tasks assessed/performed              Past Medical History:  Diagnosis Date   Allergy    Anxiety    Arthritis    Cataract    Depression    GERD (gastroesophageal reflux disease)    Headache(784.0)    migrains   Heart murmur    Hypothyroidism    Pre-diabetes    Rheumatoid arthritis(714.0)    Past Surgical History:  Procedure Laterality Date   ABDOMINAL HYSTERECTOMY     ANTERIOR CERVICAL DECOMP/DISCECTOMY FUSION N/A 01/03/2018   Procedure: ANTERIOR CERVICAL DECOMPRESSION/DISCECTOMY FUSION, ALLOGRAFT, PLATE;  Surgeon: Eldred Manges, MD;  Location: [facility] OR;  Service: Orthopedics;  Laterality: N/A;   APPENDECTOMY     CHOLECYSTECTOMY     JOINT REPLACEMENT     Both hips and both knees   REPLACEMENT TOTAL KNEE BILATERAL     SPINE SURGERY     TONSILLECTOMY     as  a child   TOTAL HIP ARTHROPLASTY     right and left   Patient Active Problem List   Diagnosis Date Noted   Unsteady gait 04/03/2022   Bilateral hearing loss 03/12/2022   Urinary incontinence 12/25/2021   Mild dementia (HCC) 06/24/2021   Coccydynia 12/23/2020   Gastroesophageal reflux disease with esophagitis without hemorrhage 04/05/2020   Iron deficiency anemia 06/22/2019   Lymphatic edema 12/06/2018   Sleep disturbance 12/05/2018   Venous stasis dermatitis of both lower extremities 09/20/2018   Varicose veins of both lower extremities 08/22/2018   Depression, major, single  episode, mild (HCC) 08/22/2018   Falls frequently 08/12/2018   Mass of thigh, right 05/17/2018   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   Status post cervical spinal fusion 01/11/2018   Chronic neck pain 10/20/2017   Chronic venous insufficiency 04/29/2017   IFG (impaired fasting glucose) 05/09/2014   Chronic constipation 02/19/2014   Rosacea 11/06/2013   GAD (generalized anxiety disorder) 06/08/2013   Essential hypertension, benign 05/04/2013   Migraine with aura 05/04/2013   Murmur, heart 05/04/2013   Hyperlipidemia 05/04/2013   Subclinical hyperthyroidism 05/04/2013   Fatigue 02/16/2012   Mixed stress and urge urinary incontinence 02/16/2012   Hyperglycemia 01/12/2012    PCP: Agapito Games, MD  REFERRING PROVIDER: same  REFERRING DIAG: I89.0  THERAPY DIAG:  Lymphedema, not elsewhere classified  Rationale for Evaluation and Treatment:   ONSET DATE: "years ago", insidious onset  SUBJECTIVE:  SUBJECTIVE STATEMENT: Joann Maddox presents to OT for treatment of BLE lymphedema Rx seated in a transport wheelchair wearing off the shelf, circular knit , knee length compression stockings bilaterally.  Her supportive spouse, Joann Maddox, accompanies her to the clinic. Pt rates LE-related pain/ discomfort in her legs as 0/10 today for . Spouse reports Pt tolerated wraps full 24 hours after last visit without pain.   PERTINENT HISTORY: HTN, varicose veins, CVI, venous stasis dermatitis, sleep disturbance, B THA, B TKA, OA, RA, HYPOTHYROIDISM, GAD, Frequent Falls, Mild dementia, urinary incontinence, unsteady gait, B hearing loss  PAIN:  Are you having pain? Yes: NPRS scale: 0/10 Pain location: B legs Pain description: heavy, full, tight, hard Aggravating factors: standing, sitting in  dependent position Relieving factors: leg elevation  PRECAUTIONS: Other: HYPOTHYROIDISM  WEIGHT BEARING RESTRICTIONS: No  FALLS:  Has patient fallen in last 6 months? Yes. Number of falls 3  LIVING ENVIRONMENT: Lives with: lives with their spouse Lives in: House/apartment; level entry, 1st floor apartment Stairs: No;  Has following equipment at home: Environmental consultant - 4 wheeled, Wheelchair (power), Tour manager, and hand held shower, multiple grab bars  OCCUPATION: retired Psychologist, forensic  HAND DOMINANCE: right   PRIOR LEVEL OF FUNCTION: Requires assistive device for independence, Needs assistance with ADLs, Needs assistance with homemaking, Needs assistance with gait, and Needs assistance with transfers  PATIENT GOALS: see SUBJECTIVE   OBJECTIVE: Note: Objective measures were completed at Evaluation unless otherwise noted.   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 (exacerbated by OA and RA)  BLE COMPARATIVE LIMB VOLUMETRICS:   INITIAL:  01/27/23 Rx      visit 1  LANDMARK RIGHT  (dominant)  R LEG (A-D) 3238.7 ml  R THIGH (E-G) ml  R FULL LIMB (A-G) ml  Limb Volume differential (LVD)  %  Volume change since initial %  Volume change overall V  (Blank rows = not tested)  LANDMARK LEFT    L LEG (A-D) 3643.7 ml  L THIGH (E-G) ml  L FULL LIMB (A-G) ml  Limb Volume differential (LVD)  12.5%, L>R  Volume change since initial %  Volume change overall %  (Blank rows = not tested)   LYMPHEDEMA LIFE IMPACT SCALE (LLIS): INITIAL 01/26/23: 60.29 % (the extent to which lymphedema -related problems affected your life in the last week)  FOTO functional outcome scale:  Initial 01/02/23: 40%  TODAY'S TREATMENT:                                                                                                                                         Multilayer compression bandaging to LLE- knee high Pt and Caregiver edu for LE self  care  PATIENT EDUCATION:  Continued Pt/ CG edu for lymphedema self care home program throughout session. Topics include outcome of comparative limb volumetrics- starting limb volume differentials (LVDs),  technology and gradient techniques used for short stretch, multilayer compression wrapping, simple self-MLD, therapeutic lymphatic pumping exercises, skin/nail care, LE precautions,. compression garment recommendations and specifications, wear and care schedule and compression garment donning / doffing w assistive devices. Discussed progress towards all OT goals since commencing CDT. All questions answered to the Pt's satisfaction. Good return. Person educated: Patient and spouse Education method: Explanation, Demonstration, and Handouts Education comprehension: verbalized understanding, returned demonstration, verbal cues required, and needs further education  HOME EXERCISE PROGRAM: BLE lymphatic pumping there ex- 1 set of 10 each element, in order. Hold 5 2. Daily compression- thigh length multilayer compression bandages during Intensive Phase CDT; During self-Management Phase appropriate thigh high compression stockings paired with compression biker shorts (off the shelf or medical grade TBD) 3. Daily skin care with low ph lotion matching skin ph 4. Daily simple self MLD  Custom-made gradient compression garments and HOS devices are medically necessary in this case because they are uniquely sized and shaped to fit the exact dimensions of the affected extremities, and to provide accurate and consistent gradient compression essential for optimally managing this patient's symptoms of chronic, progressive lymphedema. Multiple custom compression garments are needed for optimal hygiene to limit infection risk. Custom compression garments should be replaced q 3-6 months When worn consistently for optimal lipo-lymphedema self-management over time.   ASSESSMENT:  CLINICAL IMPRESSION: By end of teaching  Pt and family for multilayer, knee length compression wrapping, he is able to apply wraps using proper gradient techniques with max assist. Pt remains dependent due to short term memory limitations. Pt reiterated instructions for wrapping and laundry routines without verbal cues. He also made a video as a reference. Pt returns next week to resume care. To date L Leg swelling is visibly reduced in volume since last session and is responding positively to Rx.  Cont as per POC.    INITIAL EVAL 01/26/23:  Keshara Kiger is an 43 y o female presenting with mild, stage II BLE lymphedema, L>R 2/2 CVI and varicose veins. Her condition is exacerbated by dependent positioning most of the day. Pt presents with L>R leg swelling, positive Stemmer sign and lymphedema associated skin changes which have worsened over time. Swelling fluctuates some , but never resolves. Existing compression garments are inadequate for controlling swelling.   BLE lymphedema and associated pain limits Pt's functional performance in all occupational domains, including functional ambulation and mobility, basic and instrumental ADLs, productive activities and leisure pursuits, social participation, role performance,  and contributes to decreased body image Pt will benefit from skilled Occupational Therapy for Intensive and Self -Management Phases of Complete Decongestive Therapy (CDT) to reduce and control leg swelling and associated pain and to limit lymphedema progression. CDT 2 x weekly will include compression wrapping, skin care, manual lymphatic drainage, therapeutic exercise and behavior modification for elevation. Pt's prognosis is dependent on consistent daily caregiver help with all lymphedema self-care components, including compression wrapping and garments as treatment course transitions to self care at home. Caregiver will  receive LE self-care training for LE self-management, and support going forward PRN. Lymphedema is a chronic,  progressive condition with no cure. With skilled OT intervention symptoms will reduce apt will become independent with self-management. Without skilled OT for CDT the condition will progress and further functional decline is expected.   OBJECTIVE IMPAIRMENTS: Abnormal gait, decreased activity tolerance, decreased balance, decreased cognition, decreased knowledge of condition, decreased knowledge of use of DME, decreased mobility, difficulty walking, decreased strength, increased edema, postural dysfunction, pain,  and chronic, progressive, painful leg swelling .   ACTIVITY LIMITATIONS: carrying, lifting, bending, sitting, standing, squatting, stairs, transfers, continence, bathing, toileting, dressing, hygiene/grooming, and caring for others  PARTICIPATION LIMITATIONS: meal prep, cleaning, laundry, medication management, personal finances, driving, shopping, community activity, occupation, and yard work  PERSONAL FACTORS: Age, Fitness, Past/current experiences, Time since onset of injury/illness/exacerbation, and 1 comorbidity: HTN, OA, RA  are also affecting patient's functional outcome.   REHAB POTENTIAL: Good  CLINICAL DECISION MAKING: Stable/uncomplicated  EVALUATION COMPLEXITY: Moderate  GOALS: Goals reviewed with patient? Yes   SHORT TERM GOALS: Target date: 4th OT Rx visit    Pt will demonstrate understanding of lymphedema precautions and prevention strategies with maximum assistance from caregiver  using a printed reference to identify at least 5 precautions and discussing how s/he may implement them into daily life to reduce risk of progression with modified assistance Baseline: Dependent Goal status: INITIAL   2.  With Max caregiver assistance Pt will be able to apply multilayer, knee length, compression wraps using gradient techniques to decrease limb volume, to limit infection risk, and to limit lymphedema progression.  Given this patient's Intake score of tbd % on the Lymphedema  Life Impact Scale (LLIS), patient will experience a reduction of at least 5% in her perceived level of functional impairment resulting from lymphedema to improve functional performance and quality of life (QOL). Baseline: Dependent Goal status: INITIAL     LONG TERM GOALS: Target date: 08/09/22 (12 WEEKS)     Given this patient's Intake score of 60.29 % on the Lymphedema Life Impact Scale (LLIS), patient will experience a reduction of at least 5% in her perceived level of functional impairment resulting from lymphedema to improve functional performance and quality of life (QOL).Baseline: tbd Baseline: max a Goal status: INITIAL   2.  Given this patient's Intake score of TBA/100% on the functional outcomes FOTO tool, patient will experience an increase in function of 5 points to improve basic and instrumental ADLs performance, including lymphedema self-care. (TBA at first OT Rx visit) Baseline: Dependent Goal status: INITIAL   3.  With max caregiver assistance and assistive devices Pt will be able to don and doff appropriate compression garments and/or devices to control BLE lymphedema and to limit progression.  Baseline: Dependent Goal status: INITIAL   4. With max assistance Pt will achieve at least a 10% volume reductions bilaterally below the knees to return limb to more typical size and shape, to limit infection risk and LE progression, to decrease pain, to improve function, and to improve body image and QOL. Baseline: Dependent Goal status: INITIAL   5. Pt will achieve and sustain at least 85% attendance at OT sessions, and with compliance with all LE self-care home program components throughout CDT, including modified simple self-MLD, daily skin care and inspection, lymphatic pumping the ex and appropriate compression to limit lymphedema progression and to limit further functional decline. Baseline: Dependent Goal status: INITIAL      PLAN:  OT FREQUENCY: 2x/week  OT DURATION:  12 weeks  PLANNED INTERVENTIONS: Therapeutic exercises, Therapeutic activity, Patient/Family education, Self Care, DME instructions, Manual lymph drainage, Compression bandaging, scar mobilization, Manual therapy, and skin care throughout manual therapy to reduce infection risk; fit with appropriate compression garments and/ or devices, caregiver training for assistance with all aspects of LE self care  PLAN FOR NEXT SESSION:  Multilayer compression wraps to LLE- toes to popliteal fossa Pt and family edu for gradient compression wrapping  Loel Dubonnet, MS, OTR/L, CLT-LANA  02/01/23 12:19 PM

## 2023-02-03 ENCOUNTER — Encounter: Payer: Medicare Other | Admitting: Occupational Therapy

## 2023-02-06 ENCOUNTER — Other Ambulatory Visit: Payer: Self-pay | Admitting: Family Medicine

## 2023-02-06 DIAGNOSIS — F320 Major depressive disorder, single episode, mild: Secondary | ICD-10-CM

## 2023-02-08 ENCOUNTER — Ambulatory Visit: Payer: Medicare Other | Admitting: Occupational Therapy

## 2023-02-10 ENCOUNTER — Ambulatory Visit: Payer: Medicare Other | Admitting: Occupational Therapy

## 2023-02-10 DIAGNOSIS — I890 Lymphedema, not elsewhere classified: Secondary | ICD-10-CM

## 2023-02-10 NOTE — Therapy (Signed)
OUTPATIENT OCCUPATIONAL THERAPY TREATMENT NOTE  BILATERAL LOWER EXTREMITY LYMPHEDEMA  Patient Name: Joann Maddox MRN: [id number] DOB:[birth year], 82 y.o., female Today's Date: 02/10/2023  END OF SESSION:   OT End of Session - 02/10/23 1123     Visit Number 4    Number of Visits 36    Date for OT Re-Evaluation 04/26/23    OT Start Time 1110    OT Stop Time 1208    OT Time Calculation (min) 58 min    Activity Tolerance Patient tolerated treatment well;No increased pain   Pt limited by dimentia   Behavior During Therapy [facility] for tasks assessed/performed              Past Medical History:  Diagnosis Date   Allergy    Anxiety    Arthritis    Cataract    Depression    GERD (gastroesophageal reflux disease)    Headache(784.0)    migrains   Heart murmur    Hypothyroidism    Pre-diabetes    Rheumatoid arthritis(714.0)    Past Surgical History:  Procedure Laterality Date   ABDOMINAL HYSTERECTOMY     ANTERIOR CERVICAL DECOMP/DISCECTOMY FUSION N/A 01/03/2018   Procedure: ANTERIOR CERVICAL DECOMPRESSION/DISCECTOMY FUSION, ALLOGRAFT, PLATE;  Surgeon: Eldred Manges, MD;  Location: [facility] OR;  Service: Orthopedics;  Laterality: N/A;   APPENDECTOMY     CHOLECYSTECTOMY     JOINT REPLACEMENT     Both hips and both knees   REPLACEMENT TOTAL KNEE BILATERAL     SPINE SURGERY     TONSILLECTOMY     as  a child   TOTAL HIP ARTHROPLASTY     right and left   Patient Active Problem List   Diagnosis Date Noted   Unsteady gait 04/03/2022   Bilateral hearing loss 03/12/2022   Urinary incontinence 12/25/2021   Mild dementia (HCC) 06/24/2021   Coccydynia 12/23/2020   Gastroesophageal reflux disease with esophagitis without hemorrhage 04/05/2020   Iron deficiency anemia 06/22/2019   Lymphatic edema 12/06/2018   Sleep disturbance 12/05/2018   Venous stasis dermatitis of both lower extremities 09/20/2018   Varicose veins of both lower extremities 08/22/2018   Depression, major, single  episode, mild (HCC) 08/22/2018   Falls frequently 08/12/2018   Mass of thigh, right 05/17/2018   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   Status post cervical spinal fusion 01/11/2018   Chronic neck pain 10/20/2017   Chronic venous insufficiency 04/29/2017   IFG (impaired fasting glucose) 05/09/2014   Chronic constipation 02/19/2014   Rosacea 11/06/2013   GAD (generalized anxiety disorder) 06/08/2013   Essential hypertension, benign 05/04/2013   Migraine with aura 05/04/2013   Murmur, heart 05/04/2013   Hyperlipidemia 05/04/2013   Subclinical hyperthyroidism 05/04/2013   Fatigue 02/16/2012   Mixed stress and urge urinary incontinence 02/16/2012   Hyperglycemia 01/12/2012    PCP: Agapito Games, MD  REFERRING PROVIDER: same  REFERRING DIAG: I89.0  THERAPY DIAG:  Lymphedema, not elsewhere classified  Rationale for Evaluation and Treatment:   ONSET DATE: "years ago", insidious onset  SUBJECTIVE:  SUBJECTIVE STATEMENT: Mrs Joann Maddox presents to OT for treatment of BLE lymphedema Rx seated in a transport wheelchair wearing off the shelf, circular knit , knee length compression stockings bilaterally.  Her supportive spouse, Dorene Sorrow, accompanies her to the clinic. Pt rates LE-related pain/ discomfort in her legs as 5/10 today. Spouse reports he wasn't able to practice compression wrapping between visits.   PERTINENT HISTORY: HTN, varicose veins, CVI, venous stasis dermatitis, sleep disturbance, B THA, B TKA, OA, RA, HYPOTHYROIDISM, GAD, Frequent Falls, Mild dementia, urinary incontinence, unsteady gait, B hearing loss  PAIN:  Are you having pain? Yes: NPRS scale: 0/10 Pain location: B legs Pain description: heavy, full, tight, hard Aggravating factors: standing, sitting in dependent  position Relieving factors: leg elevation  PRECAUTIONS: Other: HYPOTHYROIDISM  WEIGHT BEARING RESTRICTIONS: No  FALLS:  Has patient fallen in last 6 months? Yes. Number of falls 3  LIVING ENVIRONMENT: Lives with: lives with their spouse Lives in: House/apartment; level entry, 1st floor apartment Stairs: No;  Has following equipment at home: Environmental consultant - 4 wheeled, Wheelchair (power), Tour manager, and hand held shower, multiple grab bars  OCCUPATION: retired Psychologist, forensic  HAND DOMINANCE: right   PRIOR LEVEL OF FUNCTION: Requires assistive device for independence, Needs assistance with ADLs, Needs assistance with homemaking, Needs assistance with gait, and Needs assistance with transfers  PATIENT GOALS: see SUBJECTIVE   OBJECTIVE: Note: Objective measures were completed at Evaluation unless otherwise noted.   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 (exacerbated by OA and RA)  BLE COMPARATIVE LIMB VOLUMETRICS:   INITIAL:  01/27/23 Rx      visit 1  LANDMARK RIGHT  (dominant)  R LEG (A-D) 3238.7 ml  R THIGH (E-G) ml  R FULL LIMB (A-G) ml  Limb Volume differential (LVD)  %  Volume change since initial %  Volume change overall V  (Blank rows = not tested)  LANDMARK LEFT    L LEG (A-D) 3643.7 ml  L THIGH (E-G) ml  L FULL LIMB (A-G) ml  Limb Volume differential (LVD)  12.5%, L>R  Volume change since initial %  Volume change overall %  (Blank rows = not tested)   LYMPHEDEMA LIFE IMPACT SCALE (LLIS): INITIAL 01/26/23: 60.29 % (the extent to which lymphedema -related problems affected your life in the last week)  FOTO functional outcome scale:  Initial 01/02/23: 40%  TODAY'S TREATMENT:                                                                                                                                         Multilayer compression bandaging to LLE- knee high Pt and Caregiver edu for LE self  care  PATIENT EDUCATION:  Continued Pt/ CG edu for lymphedema self care home program throughout session. Topics include outcome of comparative limb volumetrics- starting limb volume differentials (LVDs), technology and gradient techniques  used for short stretch, multilayer compression wrapping, simple self-MLD, therapeutic lymphatic pumping exercises, skin/nail care, LE precautions,. compression garment recommendations and specifications, wear and care schedule and compression garment donning / doffing w assistive devices. Discussed progress towards all OT goals since commencing CDT. All questions answered to the Pt's satisfaction. Good return. Person educated: Patient and spouse Education method: Explanation, Demonstration, and Handouts Education comprehension: verbalized understanding, returned demonstration, verbal cues required, and needs further education  HOME EXERCISE PROGRAM: BLE lymphatic pumping there ex- 1 set of 10 each element, in order. Hold 5 2. Daily compression- thigh length multilayer compression bandages during Intensive Phase CDT; During self-Management Phase appropriate thigh high compression stockings paired with compression biker shorts (off the shelf or medical grade TBD) 3. Daily skin care with low ph lotion matching skin ph 4. Daily simple self MLD  Custom-made gradient compression garments and HOS devices are medically necessary in this case because they are uniquely sized and shaped to fit the exact dimensions of the affected extremities, and to provide accurate and consistent gradient compression essential for optimally managing this patient's symptoms of chronic, progressive lymphedema. Multiple custom compression garments are needed for optimal hygiene to limit infection risk. Custom compression garments should be replaced q 3-6 months When worn consistently for optimal lipo-lymphedema self-management over time.   ASSESSMENT:  CLINICAL IMPRESSION: Continued Pt and  family edu for multilayer compression bandaging since spouse was unable to practice between visits Spouse is poor historian due to dementia. After 2 opportunities to practice with verbal and tactile cues, spouse able to apply 3 layer wraps from base of toes to popliteal fossa with min A and extra time.  Cont as per POC.    INITIAL EVAL 01/26/23:  Joann Maddox is an 89 y o female presenting with mild, stage II BLE lymphedema, L>R 2/2 CVI and varicose veins. Her condition is exacerbated by dependent positioning most of the day. Pt presents with L>R leg swelling, positive Stemmer sign and lymphedema associated skin changes which have worsened over time. Swelling fluctuates some , but never resolves. Existing compression garments are inadequate for controlling swelling.   BLE lymphedema and associated pain limits Pt's functional performance in all occupational domains, including functional ambulation and mobility, basic and instrumental ADLs, productive activities and leisure pursuits, social participation, role performance,  and contributes to decreased body image Pt will benefit from skilled Occupational Therapy for Intensive and Self -Management Phases of Complete Decongestive Therapy (CDT) to reduce and control leg swelling and associated pain and to limit lymphedema progression. CDT 2 x weekly will include compression wrapping, skin care, manual lymphatic drainage, therapeutic exercise and behavior modification for elevation. Pt's prognosis is dependent on consistent daily caregiver help with all lymphedema self-care components, including compression wrapping and garments as treatment course transitions to self care at home. Caregiver will  receive LE self-care training for LE self-management, and support going forward PRN. Lymphedema is a chronic, progressive condition with no cure. With skilled OT intervention symptoms will reduce apt will become independent with self-management. Without skilled OT for CDT  the condition will progress and further functional decline is expected.   OBJECTIVE IMPAIRMENTS: Abnormal gait, decreased activity tolerance, decreased balance, decreased cognition, decreased knowledge of condition, decreased knowledge of use of DME, decreased mobility, difficulty walking, decreased strength, increased edema, postural dysfunction, pain, and chronic, progressive, painful leg swelling .   ACTIVITY LIMITATIONS: carrying, lifting, bending, sitting, standing, squatting, stairs, transfers, continence, bathing, toileting, dressing, hygiene/grooming, and caring for others  PARTICIPATION  LIMITATIONS: meal prep, cleaning, laundry, medication management, personal finances, driving, shopping, community activity, occupation, and yard work  PERSONAL FACTORS: Age, Fitness, Past/current experiences, Time since onset of injury/illness/exacerbation, and 1 comorbidity: HTN, OA, RA  are also affecting patient's functional outcome.   REHAB POTENTIAL: Good  CLINICAL DECISION MAKING: Stable/uncomplicated  EVALUATION COMPLEXITY: Moderate  GOALS: Goals reviewed with patient? Yes   SHORT TERM GOALS: Target date: 4th OT Rx visit    Pt will demonstrate understanding of lymphedema precautions and prevention strategies with maximum assistance from caregiver  using a printed reference to identify at least 5 precautions and discussing how s/he may implement them into daily life to reduce risk of progression with modified assistance Baseline: Dependent Goal status: INITIAL   2.  With Max caregiver assistance Pt will be able to apply multilayer, knee length, compression wraps using gradient techniques to decrease limb volume, to limit infection risk, and to limit lymphedema progression.  Given this patient's Intake score of tbd % on the Lymphedema Life Impact Scale (LLIS), patient will experience a reduction of at least 5% in her perceived level of functional impairment resulting from lymphedema to improve  functional performance and quality of life (QOL). Baseline: Dependent Goal status: INITIAL     LONG TERM GOALS: Target date: 08/09/22 (12 WEEKS)     Given this patient's Intake score of 60.29 % on the Lymphedema Life Impact Scale (LLIS), patient will experience a reduction of at least 5% in her perceived level of functional impairment resulting from lymphedema to improve functional performance and quality of life (QOL).Baseline: tbd Baseline: max a Goal status: INITIAL   2.  Given this patient's Intake score of TBA/100% on the functional outcomes FOTO tool, patient will experience an increase in function of 5 points to improve basic and instrumental ADLs performance, including lymphedema self-care. (TBA at first OT Rx visit) Baseline: Dependent Goal status: INITIAL   3.  With max caregiver assistance and assistive devices Pt will be able to don and doff appropriate compression garments and/or devices to control BLE lymphedema and to limit progression.  Baseline: Dependent Goal status: INITIAL   4. With max assistance Pt will achieve at least a 10% volume reductions bilaterally below the knees to return limb to more typical size and shape, to limit infection risk and LE progression, to decrease pain, to improve function, and to improve body image and QOL. Baseline: Dependent Goal status: INITIAL   5. Pt will achieve and sustain at least 85% attendance at OT sessions, and with compliance with all LE self-care home program components throughout CDT, including modified simple self-MLD, daily skin care and inspection, lymphatic pumping the ex and appropriate compression to limit lymphedema progression and to limit further functional decline. Baseline: Dependent Goal status: INITIAL      PLAN:  OT FREQUENCY: 2x/week  OT DURATION: 12 weeks  PLANNED INTERVENTIONS: Therapeutic exercises, Therapeutic activity, Patient/Family education, Self Care, DME instructions, Manual lymph drainage,  Compression bandaging, scar mobilization, Manual therapy, and skin care throughout manual therapy to reduce infection risk; fit with appropriate compression garments and/ or devices, caregiver training for assistance with all aspects of LE self care  PLAN FOR NEXT SESSION:  Multilayer compression wraps to LLE- toes to popliteal fossa Pt and family edu for gradient compression wrapping  Loel Dubonnet, MS, OTR/L, CLT-LANA 02/10/23 12:12 PM

## 2023-02-15 ENCOUNTER — Ambulatory Visit: Payer: Medicare Other | Admitting: Occupational Therapy

## 2023-02-18 ENCOUNTER — Encounter: Payer: Medicare Other | Admitting: Occupational Therapy

## 2023-02-18 ENCOUNTER — Ambulatory Visit: Payer: Medicare Other | Admitting: Occupational Therapy

## 2023-02-22 ENCOUNTER — Ambulatory Visit: Payer: Medicare Other | Admitting: Occupational Therapy

## 2023-02-24 ENCOUNTER — Ambulatory Visit: Payer: Medicare Other | Admitting: Occupational Therapy

## 2023-03-01 ENCOUNTER — Ambulatory Visit: Payer: Medicare Other | Admitting: Occupational Therapy

## 2023-03-03 ENCOUNTER — Ambulatory Visit: Payer: Medicare Other | Admitting: Occupational Therapy

## 2023-03-08 ENCOUNTER — Encounter: Payer: Medicare Other | Admitting: Occupational Therapy

## 2023-03-10 ENCOUNTER — Encounter: Payer: Medicare Other | Admitting: Occupational Therapy

## 2023-03-15 ENCOUNTER — Encounter: Payer: Medicare Other | Admitting: Occupational Therapy

## 2023-03-17 ENCOUNTER — Encounter: Payer: Medicare Other | Admitting: Occupational Therapy

## 2023-03-19 ENCOUNTER — Other Ambulatory Visit: Payer: Self-pay | Admitting: Family Medicine

## 2023-03-23 ENCOUNTER — Encounter: Payer: Medicare Other | Admitting: Occupational Therapy

## 2023-03-29 ENCOUNTER — Ambulatory Visit: Payer: Medicare Other | Attending: Family Medicine | Admitting: Occupational Therapy

## 2023-03-31 ENCOUNTER — Ambulatory Visit: Payer: Medicare Other | Admitting: Occupational Therapy

## 2023-04-01 ENCOUNTER — Ambulatory Visit: Payer: Medicare Other | Admitting: Family Medicine

## 2023-04-01 ENCOUNTER — Encounter: Payer: Self-pay | Admitting: Family Medicine

## 2023-04-01 VITALS — BP 116/41 | HR 69 | Resp 12 | Ht 61.0 in | Wt 185.0 lb

## 2023-04-01 DIAGNOSIS — M542 Cervicalgia: Secondary | ICD-10-CM

## 2023-04-01 DIAGNOSIS — G4710 Hypersomnia, unspecified: Secondary | ICD-10-CM | POA: Diagnosis not present

## 2023-04-01 DIAGNOSIS — R7301 Impaired fasting glucose: Secondary | ICD-10-CM

## 2023-04-01 DIAGNOSIS — G479 Sleep disorder, unspecified: Secondary | ICD-10-CM

## 2023-04-01 DIAGNOSIS — F320 Major depressive disorder, single episode, mild: Secondary | ICD-10-CM

## 2023-04-01 DIAGNOSIS — G301 Alzheimer's disease with late onset: Secondary | ICD-10-CM

## 2023-04-01 DIAGNOSIS — G8929 Other chronic pain: Secondary | ICD-10-CM

## 2023-04-01 DIAGNOSIS — F02A0 Dementia in other diseases classified elsewhere, mild, without behavioral disturbance, psychotic disturbance, mood disturbance, and anxiety: Secondary | ICD-10-CM

## 2023-04-01 DIAGNOSIS — I10 Essential (primary) hypertension: Secondary | ICD-10-CM

## 2023-04-01 DIAGNOSIS — R011 Cardiac murmur, unspecified: Secondary | ICD-10-CM

## 2023-04-01 LAB — POCT GLYCOSYLATED HEMOGLOBIN (HGB A1C): Hemoglobin A1C: 5.6 % (ref 4.0–5.6)

## 2023-04-01 NOTE — Assessment & Plan Note (Signed)
A1c looks great today at 5.6.  Continue current regimen.  Follow-up in 6 months.

## 2023-04-01 NOTE — Progress Notes (Signed)
Established Patient Office Visit  Subjective   Patient ID: Joann Maddox, female    DOB: 04/15/1941  Age: 82 y.o. MRN: [id number]  Chief Complaint  Patient presents with   mood    HPI  Hypertension- Pt denies chest pain, SOB, dizziness, or heart palpitations.  Taking meds as directed w/o problems.  Denies medication side effects.    Impaired fasting glucose-no increased thirst or urination. No symptoms consistent with hypoglycemia.  C/O of excessive sleeping during the daytime  She did see the neurologist Dr. Ane Payment in October.  He then referred her for evaluation with a psychologist at [facility] November 26 to follow-up on the mild dementia.  But is concerned because he feels like her memory is really getting a little worse.  They did do some formal testing I do not have a copy of the report yet.  She is currently on Exelon patch.  Overall her pain from her neck seems to be better.  She is just using the tramadol and meloxicam as needed now instead of daily.    ROS    Objective:     BP (!) 116/41   Pulse 69   Resp 12   Ht 5\' 1"  (1.549 m)   Wt 185 lb 0.6 oz (83.9 kg)   SpO2 93%   BMI 34.96 kg/m    Physical Exam Vitals and nursing note reviewed.  Constitutional:      Appearance: Normal appearance.  HENT:     Head: Normocephalic and atraumatic.  Eyes:     Conjunctiva/sclera: Conjunctivae normal.  Cardiovascular:     Rate and Rhythm: Normal rate and regular rhythm.     Heart sounds: Murmur heard.  Pulmonary:     Effort: Pulmonary effort is normal.     Breath sounds: Normal breath sounds.  Skin:    General: Skin is warm and dry.  Neurological:     Mental Status: She is alert.  Psychiatric:        Mood and Affect: Mood normal.      Results for orders placed or performed in visit on 04/01/23  POCT HgB A1C  Result Value Ref Range   Hemoglobin A1C 5.6 4.0 - 5.6 %   HbA1c POC (<> result, manual entry)     HbA1c, POC (prediabetic range)     HbA1c, POC  (controlled diabetic range)        The ASCVD Risk score (Arnett DK, et al., 2019) failed to calculate for the following reasons:   The 2019 ASCVD risk score is only valid for ages 48 to 109    Assessment & Plan:   Problem List Items Addressed This Visit       Cardiovascular and Mediastinum   Essential hypertension, benign    Well controlled. Continue current regimen. Follow up in  10mo         Endocrine   IFG (impaired fasting glucose) - Primary    A1c looks great today at 5.6.  Continue current regimen.  Follow-up in 6 months.      Relevant Orders   POCT HgB A1C (Completed)     Nervous and Auditory   Mild dementia (HCC)    Try to get a copy of the report from the psychologists over at [facility].  Currently on Exelon patch 9.5 mg.  Hopefully she will have a follow-up with a neurologist as well and they can make any adjustments they feel are needed.  08/27/22: [facility]: 18/30 Diarrhea with Aricept.  GI  upset with memantadine.         Other   Sleep disturbance    Visual still completely off she is reads in the middle the night and then sleeps mostly during the day we discussed previously trying to shift that sleep cycle.      Murmur, heart   Excessive sleepiness   Depression, major, single episode, mild (HCC)   Chronic neck pain    No longer using her pain medications daily which is fantastic.  Does not need any refills right now.       Return in about 4 months (around 07/31/2023) for Hypertension, Pre-diabetes.   I spent 40 minutes on the day of the encounter to include pre-visit record review, face-to-face time with the patient and post visit ordering of test.   Nani Gasser, MD

## 2023-04-01 NOTE — Assessment & Plan Note (Signed)
Well controlled. Continue current regimen. Follow up in  6 mo  

## 2023-04-01 NOTE — Assessment & Plan Note (Signed)
No longer using her pain medications daily which is fantastic.  Does not need any refills right now.

## 2023-04-01 NOTE — Assessment & Plan Note (Signed)
Visual still completely off she is reads in the middle the night and then sleeps mostly during the day we discussed previously trying to shift that sleep cycle.

## 2023-04-01 NOTE — Assessment & Plan Note (Signed)
Try to get a copy of the report from the psychologists over at [place].  Currently on Exelon patch 9.5 mg.  Hopefully she will have a follow-up with a neurologist as well and they can make any adjustments they feel are needed.  08/27/22: [facility]: 18/30 Diarrhea with Aricept.  GI upset with memantadine.

## 2023-04-05 ENCOUNTER — Ambulatory Visit: Payer: Medicare Other | Admitting: Occupational Therapy

## 2023-04-06 ENCOUNTER — Telehealth: Payer: Self-pay | Admitting: Occupational Therapy

## 2023-04-06 NOTE — Telephone Encounter (Signed)
LVM for Joann Maddox or Joann Maddox of the next appiontment day/time and asked them to give [place] a call if they cant make this appointment. Left our phone number as well.

## 2023-04-08 ENCOUNTER — Ambulatory Visit: Payer: Medicare Other | Admitting: Occupational Therapy

## 2023-04-12 ENCOUNTER — Other Ambulatory Visit: Payer: Self-pay | Admitting: Family Medicine

## 2023-04-12 ENCOUNTER — Ambulatory Visit: Payer: Medicare Other | Admitting: Occupational Therapy

## 2023-04-13 ENCOUNTER — Telehealth: Payer: Self-pay

## 2023-04-13 MED ORDER — HYDROXYCHLOROQUINE SULFATE 200 MG PO TABS: ORAL_TABLET | ORAL | 1 refills | Status: DC

## 2023-04-13 MED ORDER — GABAPENTIN 100 MG PO CAPS: ORAL_CAPSULE | ORAL | 1 refills | Status: DC

## 2023-04-13 NOTE — Telephone Encounter (Signed)
Copied from CRM [id number]. Topic: Clinical - Prescription Issue >> Apr 13, 2023 11:12 AM Sasha H wrote: Reason for CRM: pt needs refills on Gabapentin (TAKE 2 CAPSULES BY MOUTH IN THE MORNING AND MID AFTERNOON), Hydroxychloroquine Sulfate (TALE 2 TABLETS BY MOUTH WITH FOOD OR MILK ONCE A DAY), ASAP as they are leaving tomorrow. Her husband also wanted it noted that she had a fall, but no injuries. Just soreness

## 2023-04-13 NOTE — Telephone Encounter (Signed)
Routing to provider as an FYI regarding patient's fall. Rx refills sent to the preferred pharmacy.

## 2023-04-14 ENCOUNTER — Ambulatory Visit: Payer: Medicare Other | Admitting: Occupational Therapy

## 2023-04-14 ENCOUNTER — Other Ambulatory Visit: Payer: Self-pay | Admitting: Family Medicine

## 2023-04-16 ENCOUNTER — Encounter: Payer: Self-pay | Admitting: Family Medicine

## 2023-04-16 DIAGNOSIS — R2681 Unsteadiness on feet: Secondary | ICD-10-CM

## 2023-04-16 DIAGNOSIS — R296 Repeated falls: Secondary | ICD-10-CM

## 2023-04-17 NOTE — Addendum Note (Signed)
Addended by: Nani Gasser D on: 04/17/2023 08:17 PM   Modules accepted: Orders

## 2023-04-17 NOTE — Telephone Encounter (Signed)
Orders Placed This Encounter  Procedures   Ambulatory referral to Home Health    Referral Priority:   Routine    Referral Type:   Home Health Care    Referral Reason:   Specialty Services Required    Requested Specialty:   Home Health Services    Number of Visits Requested:   1    

## 2023-05-03 ENCOUNTER — Encounter: Payer: Medicare Other | Admitting: Occupational Therapy

## 2023-05-05 ENCOUNTER — Encounter: Payer: Medicare Other | Admitting: Occupational Therapy

## 2023-05-10 ENCOUNTER — Encounter: Payer: Self-pay | Admitting: Emergency Medicine

## 2023-05-10 ENCOUNTER — Encounter: Payer: Medicare Other | Admitting: Occupational Therapy

## 2023-05-10 ENCOUNTER — Ambulatory Visit
Admission: EM | Admit: 2023-05-10 | Discharge: 2023-05-10 | Disposition: A | Discharge status: Home / Self Care | Payer: Medicare Other | Attending: Family Medicine | Admitting: Family Medicine

## 2023-05-10 DIAGNOSIS — H00011 Hordeolum externum right upper eyelid: Secondary | ICD-10-CM

## 2023-05-10 MED ORDER — ERYTHROMYCIN 5 MG/GM OP OINT: 1.0000 | TOPICAL_OINTMENT | Freq: Three times a day (TID) | OPHTHALMIC | 0 refills | Status: DC

## 2023-05-10 NOTE — ED Triage Notes (Signed)
 Patient c/o red area on her right eyelid for several days.  There is some swelling and pain, some visual disturbance.  Patient does wear glasses.  Applied a warm compress to the eye this am.

## 2023-05-10 NOTE — ED Provider Notes (Signed)
 [facility]    CSN: [id number] Arrival date & time: 05/10/23  1641      History   Chief Complaint Chief Complaint  Patient presents with   Eyelid Problem    HPI Joann Maddox is a 83 y.o. female.   HPI  Very pleasant 83 year old woman.  She does have memory impairment.  She is accompanied by her husband.  She has had a bump on her right upper eyelid for several days.  It bothers her.  She rubs it.  Husband states it looks like a pimple.  Her vision is not impaired.  Past Medical History:  Diagnosis Date   Allergy    Anxiety    Arthritis    Cataract    Depression    GERD (gastroesophageal reflux disease)    Headache(784.0)    migrains   Heart murmur    Hypothyroidism    Pre-diabetes    Rheumatoid arthritis(714.0)     Patient Active Problem List   Diagnosis Date Noted   Excessive sleepiness 04/01/2023   Unsteady gait 04/03/2022   Bilateral hearing loss 03/12/2022   Urinary incontinence 12/25/2021   Mild dementia (HCC) 06/24/2021   Coccydynia 12/23/2020   Gastroesophageal reflux disease with esophagitis without hemorrhage 04/05/2020   Iron deficiency anemia 06/22/2019   Lymphatic edema 12/06/2018   Sleep disturbance 12/05/2018   Venous stasis dermatitis of both lower extremities 09/20/2018   Varicose veins of both lower extremities 08/22/2018   Depression, major, single episode, mild (HCC) 08/22/2018   Falls frequently 08/12/2018   Mass of thigh, right 05/17/2018   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   Status post cervical spinal fusion 01/11/2018   Chronic neck pain 10/20/2017   Chronic venous insufficiency 04/29/2017   IFG (impaired fasting glucose) 05/09/2014   Chronic constipation 02/19/2014   Rosacea 11/06/2013   GAD (generalized anxiety disorder) 06/08/2013   Essential hypertension, benign 05/04/2013   Migraine with aura 05/04/2013   Murmur, heart 05/04/2013   Hyperlipidemia 05/04/2013   Subclinical hyperthyroidism  05/04/2013   Fatigue 02/16/2012   Mixed stress and urge urinary incontinence 02/16/2012   Hyperglycemia 01/12/2012    Past Surgical History:  Procedure Laterality Date   ABDOMINAL HYSTERECTOMY     ANTERIOR CERVICAL DECOMP/DISCECTOMY FUSION N/A 01/03/2018   Procedure: ANTERIOR CERVICAL DECOMPRESSION/DISCECTOMY CORTEZ MOELLER, PLATE;  Surgeon: Barbarann Oneil BROCKS, MD;  Location: [facility] OR;  Service: Orthopedics;  Laterality: N/A;   APPENDECTOMY     CHOLECYSTECTOMY     JOINT REPLACEMENT     Both hips and both knees   REPLACEMENT TOTAL KNEE BILATERAL     SPINE SURGERY     TONSILLECTOMY     as  a child   TOTAL HIP ARTHROPLASTY     right and left    OB History   No obstetric history on file.      Home Medications    Prior to Admission medications   Medication Sig Start Date End Date Taking? Authorizing Provider  azelaic acid  (AZELEX ) 20 % cream APPLY TOPICALL 2 TIMES A DAY (MORNING AND EVENING). APPLY AFTER SKIN IS WASHED AND PATTED DRY. 12/25/21  Yes Alvan Dorothyann BIRCH, MD  cyanocobalamin (VITAMIN B12) 1000 MCG tablet Take 5,000 mcg by mouth daily.   Yes [provider]  diclofenac  Sodium (VOLTAREN ) 1 % GEL Apply topically.   Yes [provider]  erythromycin  ophthalmic ointment Place 1 Application into the right eye 3 (three) times daily. Place a 1/4 inch ribbon of ointment  into the lower eyelid. 05/10/23  Yes Maranda Jamee Jacob, MD  FLUoxetine  (PROZAC ) 40 MG capsule TAKE 1 CAPSULE (40 MG TOTAL) BY MOUTH DAILY. 02/09/23  Yes Alvan Dorothyann BIRCH, MD  furosemide  (LASIX ) 20 MG tablet Take 1 tablet (20 mg total) by mouth in the morning. 12/29/22  Yes Alvan Dorothyann BIRCH, MD  gabapentin  (NEURONTIN ) 100 MG capsule TAKE 2 CAPSULES BY MOUTH IN THE MORNING AND MID AFTERNOON 04/13/23  Yes Alvan Dorothyann BIRCH, MD  hydroxychloroquine  (PLAQUENIL ) 200 MG tablet TALE 2 TABLETS BY MOUTH WITH FOOD OR MILK ONCE A DAY 04/13/23  Yes Alvan Dorothyann BIRCH, MD  losartan  (COZAAR ) 25 MG  tablet Take 1 tablet (25 mg total) by mouth daily. 08/27/22  Yes Alvan Dorothyann BIRCH, MD  meloxicam  (MOBIC ) 15 MG tablet TAKE 1 TABLET BY MOUTH EVERY DAY AS NEEDED FOR PAIN 03/24/23  Yes Alvan Dorothyann BIRCH, MD  mirabegron  ER (MYRBETRIQ ) 50 MG TB24 tablet Take 1 tablet (50 mg total) by mouth daily. 08/27/22  Yes Alvan Dorothyann BIRCH, MD  Multiple Vitamin (MULTIVITAMIN) tablet Take 1 tablet by mouth daily.   Yes [provider]  nystatin  ointment (MYCOSTATIN ) Apply 1 Application topically 2 (two) times daily. 12/25/21  Yes Alvan Dorothyann BIRCH, MD  ondansetron  (ZOFRAN -ODT) 4 MG disintegrating tablet Take 1 tablet (4 mg total) by mouth every 8 (eight) hours as needed for nausea or vomiting. 09/22/22  Yes Alvan Dorothyann BIRCH, MD  potassium chloride  (KLOR-CON ) 10 MEQ tablet TAKE 1 TABLET BY MOUTH TWICE A DAY 04/16/23  Yes Alvan Dorothyann BIRCH, MD  rivastigmine  (EXELON ) 9.5 mg/24hr Place 1 patch (9.5 mg total) onto the skin daily. 12/29/22  Yes Alvan Dorothyann BIRCH, MD  rosuvastatin  (CRESTOR ) 10 MG tablet TAKE 1 TABLET BY MOUTH EVERYDAY AT BEDTIME 11/16/22  Yes Alvan Dorothyann BIRCH, MD  traMADol  (ULTRAM ) 50 MG tablet TAKE 1 TO 2 TABLETS BY MOUTH 2 (TWO) TIMES DAILY AS NEEDED. 12/29/22  Yes Alvan Dorothyann BIRCH, MD  Vitamin D , Ergocalciferol , (DRISDOL ) 1.25 MG (50000 UNIT) CAPS capsule TAKE 1 CAPSULE (50,000 UNITS TOTAL) BY MOUTH EVERY SUNDAY. 11/16/22  Yes Alvan Dorothyann BIRCH, MD    Family History Family History  Problem Relation Age of Onset   Stroke Mother 18   Vision loss Mother    Heart disease Father 18   Arthritis Brother    Birth defects Son    Diabetes Other        aunt     Social History Social History   Tobacco Use   Smoking status: Never   Smokeless tobacco: Never  Vaping Use   Vaping status: Never Used  Substance Use Topics   Alcohol use: Never   Drug use: Never     Allergies   Iodinated contrast media, Iodine, Codeine, Lipitor [atorvastatin], Aricept  [donepezil], and Memantine hcl   Review of Systems Review of Systems See HPI  Physical Exam Triage Vital Signs ED Triage Vitals  Encounter Vitals Group     BP 05/10/23 1654 129/71     Systolic BP Percentile --      Diastolic BP Percentile --      Pulse Rate 05/10/23 1654 67     Resp 05/10/23 1654 16     Temp 05/10/23 1654 (!) 97.5 F (36.4 C)     Temp Source 05/10/23 1654 Oral     SpO2 05/10/23 1654 97 %     Weight 05/10/23 1656 175 lb (79.4 kg)     Height 05/10/23 1656 5' 2 (1.575 m)  Head Circumference --      Peak Flow --      Pain Score 05/10/23 1656 8     Pain Loc --      Pain Education --      Exclude from Growth Chart --    No data found.  Updated Vital Signs BP 129/71 (BP Location: Right Arm)   Pulse 67   Temp (!) 97.5 F (36.4 C) (Oral)   Resp 16   Ht 5' 2 (1.575 m)   Wt 79.4 kg   SpO2 97%   BMI 32.01 kg/m      Physical Exam Constitutional:      General: She is not in acute distress.    Appearance: She is well-developed.     Comments: Pleasant.  Overweight uses a cane  HENT:     Head: Normocephalic and atraumatic.  Eyes:     Conjunctiva/sclera: Conjunctivae normal.     Pupils: Pupils are equal, round, and reactive to light.      Comments: External hordeolum is visible on the right upper eyelid.  Lid is everted.  No punctum is seen.  No conjunctival injection or discharge  Cardiovascular:     Rate and Rhythm: Normal rate.  Pulmonary:     Effort: Pulmonary effort is normal. No respiratory distress.  Abdominal:     General: There is no distension.     Palpations: Abdomen is soft.  Musculoskeletal:        General: Normal range of motion.     Cervical back: Normal range of motion.  Skin:    General: Skin is warm and dry.  Neurological:     Mental Status: She is alert.      UC Treatments / Results  Labs (all labs ordered are listed, but only abnormal results are displayed) Labs Reviewed - No data to display  EKG   Radiology No  results found.  Procedures Procedures (including critical care time)  Medications Ordered in UC Medications - No data to display  Initial Impression / Assessment and Plan / UC Course  I have reviewed the triage vital signs and the nursing notes.  Pertinent labs & imaging results that were available during my care of the patient were reviewed by me and considered in my medical decision making (see chart for details).     Final Clinical Impressions(s) / UC Diagnoses   Final diagnoses:  Hordeolum externum of right upper eyelid     Discharge Instructions      Use the eye ointment 3 times a day Use warm compress 3-4 times a day You should see improvement in a couple of days   ED Prescriptions     Medication Sig Dispense Auth. Provider   erythromycin  ophthalmic ointment Place 1 Application into the right eye 3 (three) times daily. Place a 1/4 inch ribbon of ointment into the lower eyelid. 1 g Maranda Jamee Jacob, MD      PDMP not reviewed this encounter.   Maranda Jamee Jacob, MD 05/10/23 GENNIE

## 2023-05-10 NOTE — Discharge Instructions (Signed)
 Use the eye ointment 3 times a day Use warm compress 3-4 times a day You should see improvement in a couple of days

## 2023-05-12 ENCOUNTER — Encounter: Payer: Medicare Other | Admitting: Occupational Therapy

## 2023-05-17 ENCOUNTER — Encounter: Payer: Medicare Other | Admitting: Occupational Therapy

## 2023-05-17 ENCOUNTER — Other Ambulatory Visit: Payer: Self-pay | Admitting: Family Medicine

## 2023-05-19 ENCOUNTER — Encounter: Payer: Medicare Other | Admitting: Occupational Therapy

## 2023-05-24 ENCOUNTER — Encounter: Payer: Medicare Other | Admitting: Occupational Therapy

## 2023-05-26 ENCOUNTER — Encounter: Payer: Medicare Other | Admitting: Occupational Therapy

## 2023-05-31 ENCOUNTER — Encounter: Payer: Medicare Other | Admitting: Occupational Therapy

## 2023-06-02 ENCOUNTER — Encounter: Payer: Medicare Other | Admitting: Occupational Therapy

## 2023-06-07 ENCOUNTER — Encounter: Payer: Medicare Other | Admitting: Occupational Therapy

## 2023-06-09 ENCOUNTER — Encounter: Payer: Medicare Other | Admitting: Occupational Therapy

## 2023-06-13 ENCOUNTER — Other Ambulatory Visit: Payer: Self-pay | Admitting: Family Medicine

## 2023-06-14 ENCOUNTER — Encounter: Payer: Medicare Other | Admitting: Occupational Therapy

## 2023-06-15 ENCOUNTER — Other Ambulatory Visit: Payer: Self-pay | Admitting: Family Medicine

## 2023-06-16 ENCOUNTER — Encounter: Payer: Medicare Other | Admitting: Occupational Therapy

## 2023-06-21 ENCOUNTER — Encounter: Payer: Medicare Other | Admitting: Occupational Therapy

## 2023-06-22 ENCOUNTER — Other Ambulatory Visit: Payer: Self-pay | Admitting: Family Medicine

## 2023-06-22 DIAGNOSIS — R600 Localized edema: Secondary | ICD-10-CM

## 2023-06-23 ENCOUNTER — Encounter: Payer: Medicare Other | Admitting: Occupational Therapy

## 2023-06-24 ENCOUNTER — Ambulatory Visit: Payer: Self-pay | Admitting: Family Medicine

## 2023-06-24 NOTE — Telephone Encounter (Signed)
  Chief Complaint: family concerned for TIA Symptoms: words weren't make sense, falling asleep, memory loss Frequency: LKW about an hour prior to call Pertinent Negatives: Patient denies symptoms at the time of call Disposition: [] ED /[] Urgent Care (no appt availability in office) / [] Appointment(In office/virtual)/ []  [facility]/ [] Home Care/ [x] Refused Recommended Disposition /[] [place] [facility]/ []  Follow-up with PCP Additional Notes: patient's daughter in law called with concerns of patient not making sense with her words, falling asleep and memory loss. Daughter in law has been staying with patient for the week and noticed about 30 mins prior to call that her words weren't make sense. No balance concerns and speech. Per protocol, recommendation would be for the ED. Daughter in law states patient is back to her baseline and is unsure because of her history of dementia. Refusing ED at this time. Daughter in law states she will watch her and would like for PCP to get back to patient's husband tomorrow. Verbalized understanding of plan and all questions answered.     Copied from CRM [id number]. Topic: Clinical - Red Word Triage >> Jun 24, 2023  4:38 PM Martha Clan wrote: Red Word that prompted transfer to Nurse Triage: On the phone with daughter in law, worried about a mini stroke. Words aren't making sense, falling asleep, memory loss. Reason for Disposition  [1] Weakness (i.e., paralysis, loss of muscle strength) of the face, arm / hand, or leg / foot on one side of the body AND [2] sudden onset AND [3] brief (now gone)  Answer Assessment - Initial Assessment Questions 1. SYMPTOM: "What is the main symptom you are concerned about?" (e.g., weakness, numbness)     Words aren't sense, falling asleep and memory loss 2. ONSET: "When did this start?" (minutes, hours, days; while sleeping)     Started in the last half hour 3. LAST NORMAL: "When was the last time you (the patient) were  normal (no symptoms)?"     Was at her baseline about an hour ago 4. PATTERN "Does this come and go, or has it been constant since it started?"  "Is it present now?"     constant 5. CARDIAC SYMPTOMS: "Have you had any of the following symptoms: chest pain, difficulty breathing, palpitations?"     No 6. NEUROLOGIC SYMPTOMS: "Have you had any of the following symptoms: headache, dizziness, vision loss, double vision, changes in speech, unsteady on your feet?"     Unsure if any symptoms-pt's has dementia 7. OTHER SYMPTOMS: "Do you have any other symptoms?"     no  Protocols used: Neurologic Deficit-A-AH

## 2023-06-25 NOTE — Telephone Encounter (Signed)
 Patient scheduled.

## 2023-06-25 NOTE — Telephone Encounter (Signed)
 Can we call and check on her and make sure she is still doing okay.  I know I do not normally see her until April but if we need to move it up to March we can.  I know I am pretty booked up next week and the week after I am on pal but maybe the third week in March would work if we need to move her up.

## 2023-06-28 ENCOUNTER — Encounter: Payer: Medicare Other | Admitting: Occupational Therapy

## 2023-06-30 ENCOUNTER — Encounter: Payer: Medicare Other | Admitting: Occupational Therapy

## 2023-07-05 ENCOUNTER — Encounter: Payer: Medicare Other | Admitting: Occupational Therapy

## 2023-07-07 ENCOUNTER — Encounter: Payer: Medicare Other | Admitting: Occupational Therapy

## 2023-07-21 ENCOUNTER — Other Ambulatory Visit: Payer: Self-pay | Admitting: Family Medicine

## 2023-07-21 DIAGNOSIS — N3944 Nocturnal enuresis: Secondary | ICD-10-CM

## 2023-07-22 ENCOUNTER — Other Ambulatory Visit: Payer: Self-pay | Admitting: Family Medicine

## 2023-07-22 ENCOUNTER — Ambulatory Visit: Payer: Medicare Other | Admitting: Family Medicine

## 2023-07-22 DIAGNOSIS — F320 Major depressive disorder, single episode, mild: Secondary | ICD-10-CM

## 2023-08-05 ENCOUNTER — Ambulatory Visit: Payer: Medicare Other | Admitting: Family Medicine

## 2023-08-05 ENCOUNTER — Encounter: Payer: Self-pay | Admitting: Family Medicine

## 2023-08-05 ENCOUNTER — Ambulatory Visit

## 2023-08-05 VITALS — BP 118/62 | HR 63 | Ht 61.0 in | Wt 178.0 lb

## 2023-08-05 DIAGNOSIS — Z780 Asymptomatic menopausal state: Secondary | ICD-10-CM

## 2023-08-05 DIAGNOSIS — M25511 Pain in right shoulder: Secondary | ICD-10-CM

## 2023-08-05 DIAGNOSIS — I10 Essential (primary) hypertension: Secondary | ICD-10-CM | POA: Diagnosis not present

## 2023-08-05 DIAGNOSIS — R7301 Impaired fasting glucose: Secondary | ICD-10-CM | POA: Diagnosis not present

## 2023-08-05 DIAGNOSIS — M542 Cervicalgia: Secondary | ICD-10-CM

## 2023-08-05 DIAGNOSIS — D509 Iron deficiency anemia, unspecified: Secondary | ICD-10-CM | POA: Diagnosis not present

## 2023-08-05 DIAGNOSIS — F02A0 Dementia in other diseases classified elsewhere, mild, without behavioral disturbance, psychotic disturbance, mood disturbance, and anxiety: Secondary | ICD-10-CM

## 2023-08-05 DIAGNOSIS — G8929 Other chronic pain: Secondary | ICD-10-CM

## 2023-08-05 DIAGNOSIS — G301 Alzheimer's disease with late onset: Secondary | ICD-10-CM

## 2023-08-05 LAB — POCT GLYCOSYLATED HEMOGLOBIN (HGB A1C): Hemoglobin A1C: 5.4 % (ref 4.0–5.6)

## 2023-08-05 NOTE — Assessment & Plan Note (Signed)
 Only uses tramadol for more severe pain.

## 2023-08-05 NOTE — Assessment & Plan Note (Signed)
 A1c looks phenomenal today at 5.4.  Will continue to monitor periodically.  She has lost 7 pounds since she was last here we will keep an eye on this as well.

## 2023-08-05 NOTE — Progress Notes (Unsigned)
 Established Patient Office Visit  Subjective  Patient ID: Joann Maddox, female    DOB: February 18, 1941  Age: 83 y.o. MRN: [id number]  Chief Complaint  Patient presents with   Hypertension    HPI  Hypertension- Pt denies chest pain, SOB, dizziness, or heart palpitations.  Taking meds as directed w/o problems.  Denies medication side effects.    He is also had right shoulder pain for about a week.  Had an x-ray of the right shoulder back in 2020 with Dr. Annell Greening.  As well as a CT of her right shoulder in 2028 with Dr. Clementeen Graham.  CT showed severely commuted impacted fracture of the right humeral head with partial healing of the fracture.  As well as a glenohumeral joint effusion.  {History (Optional):23778}  ROS    Objective:     BP 118/62   Pulse 63   Ht 5\' 1"  (1.549 m)   Wt 178 lb (80.7 kg)   SpO2 96%   BMI 33.63 kg/m  {Vitals History (Optional):23777}  Physical Exam Vitals and nursing note reviewed.  Constitutional:      Appearance: Normal appearance.  HENT:     Head: Normocephalic and atraumatic.  Eyes:     Conjunctiva/sclera: Conjunctivae normal.  Cardiovascular:     Rate and Rhythm: Normal rate and regular rhythm.  Pulmonary:     Effort: Pulmonary effort is normal.     Breath sounds: Normal breath sounds.  Skin:    General: Skin is warm and dry.  Neurological:     Mental Status: She is alert.  Psychiatric:        Mood and Affect: Mood normal.      Results for orders placed or performed in visit on 08/05/23  POCT HgB A1C  Result Value Ref Range   Hemoglobin A1C 5.4 4.0 - 5.6 %   HbA1c POC (<> result, manual entry)     HbA1c, POC (prediabetic range)     HbA1c, POC (controlled diabetic range)      {Labs (Optional):23779}  The ASCVD Risk score (Arnett DK, et al., 2019) failed to calculate for the following reasons:   The 2019 ASCVD risk score is only valid for ages 20 to 29    Assessment & Plan:   Problem List Items Addressed This Visit        Cardiovascular and Mediastinum   Essential hypertension, benign - Primary   Relevant Orders   CMP14+EGFR   Lipid panel   CBC   Vitamin B1   Fe+TIBC+Fer   VITAMIN D 25 Hydroxy (Vit-D Deficiency, Fractures)     Endocrine   IFG (impaired fasting glucose)   A1c looks phenomenal today at 5.4.  Will continue to monitor periodically.  She has lost 7 pounds since she was last here we will keep an eye on this as well.      Relevant Orders   POCT HgB A1C (Completed)   CMP14+EGFR   Lipid panel   CBC   Vitamin B1   Fe+TIBC+Fer   VITAMIN D 25 Hydroxy (Vit-D Deficiency, Fractures)     Nervous and Auditory   Mild dementia (HCC)   She unfortunately did not tolerate Aricept or amantadine in the past.   08/05/23: MOCA scorre of 13/30.  So she has had a decline from 18 down to 13.  She has a very supportive husband who has been very helpful.  She was [facility] in [place] for a while for her memory.  Other   Iron deficiency anemia   Relevant Orders   CMP14+EGFR   Lipid panel   CBC   Vitamin B1   Fe+TIBC+Fer   VITAMIN D 25 Hydroxy (Vit-D Deficiency, Fractures)   Chronic neck pain   Only uses tramadol for more severe pain.      Relevant Orders   CMP14+EGFR   Lipid panel   CBC   Vitamin B1   Fe+TIBC+Fer   VITAMIN D 25 Hydroxy (Vit-D Deficiency, Fractures)   Other Visit Diagnoses       Acute pain of right shoulder       Relevant Orders   DG Shoulder Right   CMP14+EGFR   Lipid panel   CBC   Vitamin B1   Fe+TIBC+Fer   VITAMIN D 25 Hydroxy (Vit-D Deficiency, Fractures)     Post-menopausal       Relevant Orders   DG Bone Density       Return in about 4 months (around 12/05/2023) for Hypertension, Mood.    Nani Gasser, MD

## 2023-08-05 NOTE — Assessment & Plan Note (Addendum)
 She unfortunately did not tolerate Aricept or amantadine in the past.   08/05/23: MOCA scorre of 13/30.  So she has had a decline from 18 down to 13.  She has a very supportive husband who has been very helpful.  She was [facility] the [facility] in [place] for a while for her memory.

## 2023-08-06 NOTE — Assessment & Plan Note (Signed)
 Get updated iron labs today just to make sure that she is adequately controlled.

## 2023-08-06 NOTE — Assessment & Plan Note (Signed)
 Pressure looks great today continue current regimen.

## 2023-08-10 ENCOUNTER — Encounter: Payer: Self-pay | Admitting: Family Medicine

## 2023-08-10 LAB — LIPID PANEL
Chol/HDL Ratio: 1.9 ratio (ref 0.0–4.4)
Cholesterol, Total: 145 mg/dL (ref 100–199)
HDL: 78 mg/dL (ref >39)
LDL Chol Calc (NIH): 55 mg/dL (ref 0–99)
Triglycerides: 54 mg/dL (ref 0–149)
VLDL Cholesterol Cal: 12 mg/dL (ref 5–40)

## 2023-08-10 LAB — CMP14+EGFR
ALT: 16 IU/L (ref 0–32)
AST: 26 IU/L (ref 0–40)
Albumin: 3.9 g/dL (ref 3.7–4.7)
Alkaline Phosphatase: 99 IU/L (ref 44–121)
BUN/Creatinine Ratio: 16 (ref 12–28)
BUN: 13 mg/dL (ref 8–27)
Bilirubin Total: 0.6 mg/dL (ref 0.0–1.2)
CO2: 23 mmol/L (ref 20–29)
Calcium: 9.2 mg/dL (ref 8.7–10.3)
Chloride: 104 mmol/L (ref 96–106)
Creatinine, Ser: 0.8 mg/dL (ref 0.57–1.00)
Globulin, Total: 2.1 g/dL (ref 1.5–4.5)
Glucose: 89 mg/dL (ref 70–99)
Potassium: 4.9 mmol/L (ref 3.5–5.2)
Sodium: 143 mmol/L (ref 134–144)
Total Protein: 6 g/dL (ref 6.0–8.5)
eGFR: 74 mL/min/{1.73_m2} (ref >59)

## 2023-08-10 LAB — VITAMIN D 25 HYDROXY (VIT D DEFICIENCY, FRACTURES): Vit D, 25-Hydroxy: 67.6 ng/mL (ref 30.0–100.0)

## 2023-08-10 LAB — CBC
Hematocrit: 34.6 % (ref 34.0–46.6)
Hemoglobin: 11.6 g/dL (ref 11.1–15.9)
MCH: 30.1 pg (ref 26.6–33.0)
MCHC: 33.5 g/dL (ref 31.5–35.7)
MCV: 90 fL (ref 79–97)
Platelets: 259 10*3/uL (ref 150–450)
RBC: 3.85 x10E6/uL (ref 3.77–5.28)
RDW: 12.9 % (ref 11.7–15.4)
WBC: 5.2 10*3/uL (ref 3.4–10.8)

## 2023-08-10 LAB — IRON,TIBC AND FERRITIN PANEL
Ferritin: 81 ng/mL (ref 15–150)
Iron Saturation: 21 % (ref 15–55)
Iron: 63 ug/dL (ref 27–139)
Total Iron Binding Capacity: 298 ug/dL (ref 250–450)
UIBC: 235 ug/dL (ref 118–369)

## 2023-08-10 LAB — VITAMIN B1: Thiamine: 111.2 nmol/L (ref 66.5–200.0)

## 2023-08-10 NOTE — Progress Notes (Signed)
 Your lab work is within acceptable range and there are no concerning findings.   ?

## 2023-08-10 NOTE — Progress Notes (Signed)
 Hi Joann Maddox, they did see evidence of the old fracture on that side but it does look like it is healed and unchanged.  There is some arthritis in the ball-and-socket part of the joint.  As well as a little arthritis in [name], acromioclavicular, joint.  1 option would be to consider formal physical therapy for your shoulder.  If that something you are interested and I can place an order to do that.

## 2023-08-11 ENCOUNTER — Ambulatory Visit

## 2023-08-11 DIAGNOSIS — Z780 Asymptomatic menopausal state: Secondary | ICD-10-CM

## 2023-08-11 DIAGNOSIS — M810 Age-related osteoporosis without current pathological fracture: Secondary | ICD-10-CM

## 2023-08-12 ENCOUNTER — Encounter: Payer: Self-pay | Admitting: Family Medicine

## 2023-08-12 ENCOUNTER — Other Ambulatory Visit: Payer: Self-pay | Admitting: Family Medicine

## 2023-08-12 DIAGNOSIS — M810 Age-related osteoporosis without current pathological fracture: Secondary | ICD-10-CM | POA: Insufficient documentation

## 2023-08-12 HISTORY — DX: Age-related osteoporosis without current pathological fracture: M81.0

## 2023-08-12 NOTE — Progress Notes (Signed)
 Hi Yissel, bone density test shows osteoporosis.   The current recommendation for osteoporosis treatment includes:   #1 calcium-total of 1200 mg of calcium daily.  If you eat a very calcium rich diet you may be able to obtain that without a supplement.  If not, then I recommend calcium 500 mg twice a day.  There are several products over-the-counter such as Caltrate D and Viactiv chews which are great options that contain calcium and vitamin D. #2 vitamin D-recommend 800 international units daily. #3 exercise-recommend 30 minutes of weightbearing exercise 3 days a week.  Resistance training ,such as doing bands and light weights, can be particularly helpful. #4 medication-if you are not currently on a bone builder, also called a bisphosphonate, then this has been shown to be very helpful in maintaining bone strength, preventing further thinning of the bones, and reducing your risk for fractures.  I would highly recommend that you consider starting 1 of these medications.  If you are okay with that then please let us  know and we will send one to your pharmacy.  If you would like to discuss further we are happy to make an appointment for you so that we can go over options for treatment.

## 2023-08-16 MED ORDER — IBANDRONATE SODIUM 150 MG PO TABS: 150.0000 mg | ORAL_TABLET | ORAL | 0 refills | Status: DC

## 2023-08-19 ENCOUNTER — Ambulatory Visit (INDEPENDENT_AMBULATORY_CARE_PROVIDER_SITE_OTHER): Admitting: Family Medicine

## 2023-08-19 ENCOUNTER — Telehealth: Payer: Self-pay | Admitting: *Deleted

## 2023-08-19 ENCOUNTER — Encounter: Payer: Self-pay | Admitting: Family Medicine

## 2023-08-19 VITALS — BP 125/59 | HR 60 | Ht 61.0 in | Wt 181.0 lb

## 2023-08-19 DIAGNOSIS — R4189 Other symptoms and signs involving cognitive functions and awareness: Secondary | ICD-10-CM | POA: Diagnosis not present

## 2023-08-19 DIAGNOSIS — M810 Age-related osteoporosis without current pathological fracture: Secondary | ICD-10-CM

## 2023-08-19 DIAGNOSIS — G301 Alzheimer's disease with late onset: Secondary | ICD-10-CM | POA: Diagnosis not present

## 2023-08-19 DIAGNOSIS — F02A0 Dementia in other diseases classified elsewhere, mild, without behavioral disturbance, psychotic disturbance, mood disturbance, and anxiety: Secondary | ICD-10-CM | POA: Diagnosis not present

## 2023-08-19 NOTE — Progress Notes (Unsigned)
   Established Patient Office Visit  Subjective  Patient ID: [name], female    DOB: 08-Aug-1940  Age: 83 y.o. MRN: [id number]  No chief complaint on file.   HPI  Still strugging with her memory. She saw [facility] and they referred her to their memory care clinic.  She actually had an upcoming appointment next month but they ended up having to move her appointment out to October because of cancellations.  They said they would put her on a wait list.  Each morning she gets disoriented and thinks it is time to go to church.  She gets her days easily mixed up.  They have someone coming in to help her for 8 hours 3 days a week.  Her husband is here with her today.  Here to go over DEXA results.    {History (Optional):23778}  ROS    Objective:     There were no vitals taken for this visit. {Vitals History (Optional):23777}  Physical Exam Vitals reviewed.  Constitutional:      Appearance: Normal appearance.  HENT:     Head: Normocephalic.  Pulmonary:     Effort: Pulmonary effort is normal.  Neurological:     Mental Status: She is alert and oriented to person, place, and time.  Psychiatric:        Mood and Affect: Mood normal.        Behavior: Behavior normal.      No results found for any visits on 08/19/23.  {Labs (Optional):23779}  The ASCVD Risk score (Arnett DK, et al., 2019) failed to calculate for the following reasons:   The 2019 ASCVD risk score is only valid for ages 58 to 110    Assessment & Plan:   Problem List Items Addressed This Visit   None Visit Diagnoses       Cognitive change    -  Primary       No follow-ups on file.    Duaine German, MD

## 2023-08-19 NOTE — Progress Notes (Unsigned)
 Pt's husband stated that he wanted to discuss the medications for her osteopetrosis treatment. He hasn't picked up the Boniva  that was sent in but is going to go get that today.   He also brought in some material about Dementia that he wanted to give and discuss with Dr. Greer Leak

## 2023-08-19 NOTE — Telephone Encounter (Signed)
 Called and lvm w/pt's husband about today's appointment (08/19/23). Since we just saw her 2 weeks ago wanted to know if she still needed to be seen today.

## 2023-08-20 ENCOUNTER — Encounter: Payer: Self-pay | Admitting: Family Medicine

## 2023-08-20 NOTE — Assessment & Plan Note (Addendum)
 Recent MoCA score of 13 out of 30 on April 10  neurology referred her to the memory clinic.  Unfortunately they had to cancel her appointment and it is not scheduled again until October did encourage them to keep that appointment and in the meantime we may look into also [facility] as a resource though I suspect there probably can be booked out about 6 months as well.  In the meantime I think we will go ahead and discontinue the patch.  I do not believe that she is really getting any benefit I think she is continuing to decline while on the [employer]  patch.

## 2023-08-20 NOTE — Assessment & Plan Note (Addendum)
 Review results and encouraged him to pick up the Boniva .  Reviewed instructions on how to properly take the medication and to notify me if causing significant heartburn symptoms.  Also discussed the importance of regular exercise and activity getting moving using stationary pedals or walking.  He does take a vitamin D  supplement.

## 2023-08-25 ENCOUNTER — Other Ambulatory Visit: Payer: Self-pay | Admitting: Family Medicine

## 2023-08-25 DIAGNOSIS — M25511 Pain in right shoulder: Secondary | ICD-10-CM

## 2023-08-25 NOTE — Progress Notes (Signed)
 Orders Placed This Encounter  Procedures  . Ambulatory referral to Physical Therapy    Referral Priority:   Routine    Referral Type:   Physical Medicine    Referral Reason:   Specialty Services Required    Requested Specialty:   Physical Therapy    Number of Visits Requested:   1

## 2023-08-25 NOTE — Progress Notes (Signed)
 Referral placed.

## 2023-08-27 ENCOUNTER — Other Ambulatory Visit: Payer: Self-pay | Admitting: Family Medicine

## 2023-08-27 ENCOUNTER — Ambulatory Visit: Admitting: Family Medicine

## 2023-08-31 ENCOUNTER — Ambulatory Visit: Payer: Self-pay | Admitting: *Deleted

## 2023-08-31 NOTE — Telephone Encounter (Signed)
 Patient is scheduled with provider on 09/02/23.

## 2023-08-31 NOTE — Telephone Encounter (Signed)
 Patient's husband calling- wife advised needs appointment Chief Complaint: In review of chart/results- patient has new diagnosis osteoporosis and was advised my have appointment to discuss treatment options.  Symptoms: shoulder pain- patient has been referred for PT Frequency: chronic pain  Disposition: [] ED /[] Urgent Care (no appt availability in office) / [x] Appointment(In office/virtual)/ []  [place] [facility]/ [] Home Care/ [] Refused Recommended Disposition /[] [place] [facility]/ []  Follow-up with PCP Additional Notes: Patient has been scheduled for consult to discuss treatment options.    Copied from CRM [id number]. Topic: Clinical - Red Word Triage >> Aug 31, 2023  3:57 PM Brittney F wrote: Red Word that prompted transfer to Nurse Triage: Right shoulder pain Reason for Disposition  Shoulder pain is a chronic symptom (recurrent or ongoing AND present > 4 weeks)  Answer Assessment - Initial Assessment Questions 1. ONSET: "When did the pain start?"     Hx fracture- shoulder aggrivated- patient in pain for some time 2. LOCATION: "Where is the pain located?"     R arm/shoulder 3. PAIN: "How bad is the pain?" (Scale 1-10; or mild, moderate, severe)   - MILD (1-3): doesn't interfere with normal activities   - MODERATE (4-7): interferes with normal activities (e.g., work or school) or awakens from sleep   - SEVERE (8-10): excruciating pain, unable to do any normal activities, unable to move arm at all due to pain     severe 4. WORK OR EXERCISE: "Has there been any recent work or exercise that involved this part of the body?"     no 5. CAUSE: "What do you think is causing the shoulder pain?"     Previous fracture  Protocols used: Shoulder Pain-A-AH

## 2023-09-02 ENCOUNTER — Ambulatory Visit (INDEPENDENT_AMBULATORY_CARE_PROVIDER_SITE_OTHER): Admitting: Family Medicine

## 2023-09-02 DIAGNOSIS — F320 Major depressive disorder, single episode, mild: Secondary | ICD-10-CM

## 2023-09-02 DIAGNOSIS — R4189 Other symptoms and signs involving cognitive functions and awareness: Secondary | ICD-10-CM | POA: Diagnosis not present

## 2023-09-02 DIAGNOSIS — F02A0 Dementia in other diseases classified elsewhere, mild, without behavioral disturbance, psychotic disturbance, mood disturbance, and anxiety: Secondary | ICD-10-CM

## 2023-09-02 DIAGNOSIS — G301 Alzheimer's disease with late onset: Secondary | ICD-10-CM | POA: Diagnosis not present

## 2023-09-02 DIAGNOSIS — F339 Major depressive disorder, recurrent, unspecified: Secondary | ICD-10-CM

## 2023-09-02 NOTE — Progress Notes (Signed)
 Established Patient Office Visit  Subjective  Patient ID: Joann Maddox, female    DOB: 03/14/1941  Age: 83 y.o. MRN: [id number]  Chief Complaint  Patient presents with   Arm Pain   Shoulder Pain    HPI   She is here today for a couple of concerns in particular her arm and shoulder have been more bothersome it has been making it a little bit more difficult to get comfortable.  Addressed her shoulder at the last office visit and had ordered a plain film which just showed that her old humerus fracture unchanged and some arthritis of the glenohumeral joint as well as some spurring of the [facility] joint best recommendation at this point is for formal physical therapy.  Her husband wanted to hold off for now he just was not sure how well she would do sometimes it is hard to get her to appointments.  She still is continuing to get a little bit more confused during the day.  Often times she thinks she is getting ready to go to church.  And her husband has noticed that she seems to be more sad and getting a little bit more down.  He really would love for her to have somebody to talk with he had mentioned it previously but would like to go ahead and pursue that.     ROS    Objective:     There were no vitals taken for this visit.   Physical Exam Vitals and nursing note reviewed.  Constitutional:      Appearance: Normal appearance.  HENT:     Head: Normocephalic and atraumatic.  Eyes:     Conjunctiva/sclera: Conjunctivae normal.  Cardiovascular:     Rate and Rhythm: Normal rate and regular rhythm.  Pulmonary:     Effort: Pulmonary effort is normal.     Breath sounds: Normal breath sounds.  Skin:    General: Skin is warm and dry.  Neurological:     Mental Status: She is alert.  Psychiatric:        Mood and Affect: Mood normal.      No results found for any visits on 09/02/23.    The ASCVD Risk score (Arnett DK, et al., 2019) failed to calculate for the following reasons:    The 2019 ASCVD risk score is only valid for ages 61 to 27    Assessment & Plan:   Problem List Items Addressed This Visit       Nervous and Auditory   Mild dementia (HCC)   Last MoCA score was 13 out of 30.  We had previously discussed getting her referred to specialty clinic.  Her husband did some research and found out that there is the memory clinic at [facility] which participates in a more national program.  Were going to go ahead and make referral there today.  Brain MRI May 2024: IMPRESSION: 1.  No evidence of an acute intracranial abnormality. 2. Moderately advanced cerebral white matter chronic small vessel ischemic disease. 3. Mild, symmetric T1 hyperintensity within the basal ganglia. Although nonspecific, this finding can be seen in setting of chronic liver disease. Correlate clinically and with liver function tests. 4. Possible small chronic infarct within the right cerebellar hemisphere. 5. Mild generalized cerebral atrophy. 6. Minimal bilateral ethmoid sinus mucosal thickening.        Other   Depression, major, single episode, mild (HCC)   She does have depression and is currently on fluoxetine  40 mg.  I could certainly try to get her in with somebody to talk.  I do wonder though if her dementia may somewhat limit that cognitive process for her to continue to build on I think in the moment it could be helpful for her.      Other Visit Diagnoses       Cognitive change    -  Primary   Relevant Orders   Ambulatory referral to Neuropsychology     Depression, recurrent (HCC)       Relevant Orders   Ambulatory referral to Behavioral Health       No follow-ups on file.    Duaine German, MD

## 2023-09-02 NOTE — Progress Notes (Signed)
 Pt reports that she is experiencing R shoulder pain that is radiating down into her arm.   She is ok w/doing PT

## 2023-09-10 ENCOUNTER — Encounter: Payer: Self-pay | Admitting: Family Medicine

## 2023-09-10 NOTE — Assessment & Plan Note (Addendum)
 Last MoCA score was 13 out of 30.  We had previously discussed getting her referred to specialty clinic.  Her husband did some research and found out that there is the memory clinic at [facility] which participates in a more national program.  Were going to go ahead and make referral there today.  Brain MRI May 2024: IMPRESSION: 1.  No evidence of an acute intracranial abnormality. 2. Moderately advanced cerebral white matter chronic small vessel ischemic disease. 3. Mild, symmetric T1 hyperintensity within the basal ganglia. Although nonspecific, this finding can be seen in setting of chronic liver disease. Correlate clinically and with liver function tests. 4. Possible small chronic infarct within the right cerebellar hemisphere. 5. Mild generalized cerebral atrophy. 6. Minimal bilateral ethmoid sinus mucosal thickening.

## 2023-09-10 NOTE — Assessment & Plan Note (Signed)
 She does have depression and is currently on fluoxetine  40 mg.  I could certainly try to get her in with somebody to talk.  I do wonder though if her dementia may somewhat limit that cognitive process for her to continue to build on I think in the moment it could be helpful for her.

## 2023-09-12 NOTE — Therapy (Signed)
 OUTPATIENT PHYSICAL THERAPY SHOULDER EVALUATION   Patient Name: Joann Maddox MRN: [id number] DOB:[birth year], 83 y.o., female Today's Date: 09/13/2023  END OF SESSION:  PT End of Session - 09/13/23 1522     Visit Number 1    Number of Visits 16    Date for PT Re-Evaluation 11/08/23    Authorization Type blue medicare; champ [place]    Progress Note Due on Visit 10    PT Start Time 1150    PT Stop Time 1235    PT Time Calculation (min) 45 min    Activity Tolerance Patient tolerated treatment well             Past Medical History:  Diagnosis Date   Allergy    Anxiety    Arthritis    Cataract    Depression    GERD (gastroesophageal reflux disease)    Headache(784.0)    migrains   Heart murmur    Hypothyroidism    Osteoporosis 08/12/2023   DEXA 4/25: t score of    Pre-diabetes    Rheumatoid arthritis(714.0)    Past Surgical History:  Procedure Laterality Date   ABDOMINAL HYSTERECTOMY     ANTERIOR CERVICAL DECOMP/DISCECTOMY FUSION N/A 01/03/2018   Procedure: ANTERIOR CERVICAL DECOMPRESSION/DISCECTOMY FUSION, ALLOGRAFT, PLATE;  Surgeon: Adah Acron, MD;  Location: [facility] OR;  Service: Orthopedics;  Laterality: N/A;   APPENDECTOMY     CHOLECYSTECTOMY     JOINT REPLACEMENT     Both hips and both knees   REPLACEMENT TOTAL KNEE BILATERAL     SPINE SURGERY     TONSILLECTOMY     as  a child   TOTAL HIP ARTHROPLASTY     right and left   Patient Active Problem List   Diagnosis Date Noted   Osteoporosis 08/12/2023   Excessive sleepiness 04/01/2023   Unsteady gait 04/03/2022   Bilateral hearing loss 03/12/2022   Urinary incontinence 12/25/2021   Mild dementia (HCC) 06/24/2021   Coccydynia 12/23/2020   Gastroesophageal reflux disease with esophagitis without hemorrhage 04/05/2020   Iron deficiency anemia 06/22/2019   Lymphatic edema 12/06/2018   Sleep disturbance 12/05/2018   Venous stasis dermatitis of both lower extremities 09/20/2018   Varicose veins of both lower  extremities 08/22/2018   Depression, major, single episode, mild (HCC) 08/22/2018   Falls frequently 08/12/2018   Mass of thigh, right 05/17/2018   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   Status post cervical spinal fusion 01/11/2018   Chronic neck pain 10/20/2017   Chronic venous insufficiency 04/29/2017   IFG (impaired fasting glucose) 05/09/2014   Chronic constipation 02/19/2014   Rosacea 11/06/2013   GAD (generalized anxiety disorder) 06/08/2013   Essential hypertension, benign 05/04/2013   Migraine with aura 05/04/2013   Murmur, heart 05/04/2013   Hyperlipidemia 05/04/2013   Subclinical hyperthyroidism 05/04/2013   Fatigue 02/16/2012   Mixed stress and urge urinary incontinence 02/16/2012   Hyperglycemia 01/12/2012    PCP: Dr Cydney Draft  REFERRING PROVIDER: Dr Cydney Draft  REFERRING DIAG: R shoulder pain   THERAPY DIAG:  Acute pain of right shoulder - Plan: PT plan of care cert/re-cert  Chronic pain of both shoulders - Plan: PT plan of care cert/re-cert  Other symptoms and signs involving the musculoskeletal system - Plan: PT plan of care cert/re-cert  Muscle weakness (generalized) - Plan: PT plan of care cert/re-cert  Rationale for Evaluation and Treatment: Rehabilitation  ONSET DATE: 07/10/23  SUBJECTIVE:  SUBJECTIVE STATEMENT: Patient reports that she has had increased pain in the R shoulder which has increased in the past two months. She does not know of any injury or irritation. She has a history to R humeral fracture several years  Hand dominance: Right  PERTINENT HISTORY: multiple joint arthritis; HTN, varicose veins, CVI, venous stasis dermatitis, sleep disturbance, B THA, B TKA, OA, RA, HYPOTHYROIDISM, GAD, Frequent Falls, Mild dementia, urinary  incontinence, unsteady gait, B hearing loss LBP; DDD cervical spine: chronic R shoulder pain with proximal humeral fx; L shoulder tendonitis - scheduled for reverse shoulder replacement; bilat LE lymphedema   PAIN:  Are you having pain? Yes: NPRS scale: 6/10  Pain location: R shoulder  Pain description: dull ache Aggravating factors: using R arm  Relieving factors: not doing anything with R arm   PRECAUTIONS: Other: poor mobility; requires assist for ADL's including transfers   RED FLAGS: None   WEIGHT BEARING RESTRICTIONS: No  FALLS:  Has patient fallen in last 6 months? No  LIVING ENVIRONMENT: Lives with: lives with their spouse Lives in: House/apartment Stairs: No Has following equipment at home: Single point cane, Environmental consultant - 2 wheeled, Wheelchair (manual), Tour manager, and Grab bars  OCCUPATION: Retired; sedentary lifestyle  PLOF: Needs assistance with ADLs  PATIENT GOALS:get her shoulder better - so that it is not painful and she can use it better.   NEXT MD VISIT: 12/06/23  OBJECTIVE:  Note: Objective measures were completed at Evaluation unless otherwise noted.  DIAGNOSTIC FINDINGS:  Xray: 4/25: 1. Remote proximal humerus fracture, healed and unchanged in alignment from prior exam. 2. Moderate glenohumeral degenerative change. 3. Increased acromioclavicular spurring. Irregularity of the distal clavicle may represent sequela of remote injury, but new from 2020.  PATIENT SURVEYS:  Modified Quick Dash: 59.1%; 59.1/100 COGNITION: Overall cognitive status: memory deficits followed by [facility]     SENSATION: WFL  POSTURE: Head forward; shoulders rounded and elevated; head of the humerus anterior in orientation; scapulae abducted and rotated along the thoracic wall  UPPER EXTREMITY ROM: pain with AROM R shoulder motion   Active ROM Right eval Left eval  Shoulder flexion 108 99  Shoulder extension 48 47  Shoulder abduction 120 85 in some  abduction  Shoulder adduction    Shoulder internal rotation    Shoulder external rotation 38 27  Elbow flexion    Elbow extension    Wrist flexion    Wrist extension    Wrist ulnar deviation    Wrist radial deviation    Wrist pronation    Wrist supination    (Blank rows = not tested)  UPPER EXTREMITY MMT: strength assessed in available ranges   MMT Right eval Left eval  Shoulder flexion 3+ 3+  Shoulder extension 4- 4  Shoulder abduction 3+ 3+  Shoulder adduction    Shoulder internal rotation    Shoulder external rotation 3- 3  Middle trapezius    Lower trapezius    Elbow flexion    Elbow extension    Wrist flexion    Wrist extension    Wrist ulnar deviation    Wrist radial deviation    Wrist pronation    Wrist supination    Grip strength (lbs)    (Blank rows = not tested)  PALPATION:  Muscular tightness through the ant/lat/posterior cervical musculature; pecs; upper trap; thoracic paraspinals  TREATMENT DATE: see exercise program - isometric exercises were modified for patient in sitting; husband and caregiver assisting with exercises    PATIENT EDUCATION: Education details: POC; HEP  Person educated: Patient and Spouse Education method: Explanation, Demonstration, Tactile cues, Verbal cues, and Handouts Education comprehension: verbalized understanding, returned demonstration, verbal cues required, tactile cues required, and needs further education  HOME EXERCISE PROGRAM: Access Code: [email or web address] URL: [email or web address] Date: 09/13/2023 Prepared by: Chancey Ringel  Exercises - Seated Scapular Retraction  - 2 x daily - 7 x weekly - 1-2 sets - 10 reps - 10 sec  hold - Seated Single Arm Shoulder Abduction with Elbow Bent and Dumbbell  - 2 x daily - 7 x weekly - 1 sets - 3 reps - 30 sec  hold - Isometric Shoulder Abduction at  Wall  - 2 x daily - 7 x weekly - 1 sets - 5-10 reps - 5 sec  hold - Isometric Shoulder Extension at Wall  - 2 x daily - 7 x weekly - 1 sets - 5-10 reps - 5 sec  hold - Standing Isometric Shoulder External Rotation with Doorway  - 2 x daily - 7 x weekly - 1 sets - 5-10 reps - 5 sec  hold - Isometric Shoulder Flexion at Wall  - 2 x daily - 7 x weekly - 1 sets - 5-10 reps - 5 sec  hold  ASSESSMENT:  CLINICAL IMPRESSION: Patient is an 83 y.o. female who was seen today for physical therapy evaluation and treatment for acute pain of R shoulder. Patient has a history of chronic R shoulder pain; cervical fusion with continued LBP; arthritis on cervical and lumbar spine; multiple joints. He has had bilat THA's and TKA's. Patient and husband report increase in R shoulder pain in the past 2 months. She presents with poor posture and alignment; decreased AROM; decreased strength; decreased functional activities; pain on a constant basis. Patient will benefit from PT to address problems identified.   OBJECTIVE IMPAIRMENTS: decreased ROM, decreased strength, increased fascial restrictions, impaired UE functional use, improper body mechanics, postural dysfunction, obesity, and pain.   ACTIVITY LIMITATIONS: carrying, lifting, bending, sitting, standing, squatting, sleeping, stairs, transfers, bed mobility, bathing, dressing, reach over head, and hygiene/grooming  PARTICIPATION LIMITATIONS: limited functional activities   PERSONAL FACTORS: Behavior pattern, Fitness, Past/current experiences, and Time since onset of injury/illness/exacerbation are also affecting patient's functional outcome.   REHAB POTENTIAL: Good  CLINICAL DECISION MAKING: Evolving/moderate complexity  EVALUATION COMPLEXITY: Moderate   GOALS: Goals reviewed with patient? Yes  SHORT TERM GOALS: Target date: 10/11/2023   Independent in initial HEP with husband's assistance  Baseline: Goal status: INITIAL  2.  Patient reports  increased functional use of R LE for ADLs including dressing and grooming with minimal to no increase in pain  Baseline:  Goal status: INITIAL  3.  Patient demonstrates palpable activation of middle and lower trap for elevation of R shoulder  Baseline:  Goal status: INITIAL   LONG TERM GOALS: Target date: 11/08/2023   Increase AROM R shoulder by 5-7 degrees  Baseline:  Goal status: INITIAL  2.  Increase strength R shoulder strength in flex; abd; ER by 1/2 to 1 muscle grade  Baseline:  Goal status: INITIAL  3.  Patient demonstrates ability to reach to top of head with R UE to facilitate grooming and self care such as combing hair  Baseline:  Goal status: INITIAL  4.  Independent in HEP  Baseline:  Goal status: INITIAL  5.  Improve Modified Quick Dash  Baseline: 59.1%; 59.1/100 Goal status: INITIAL  PLAN:  PT FREQUENCY: 2x/week  PT DURATION: 8 weeks  PLANNED INTERVENTIONS: 97164- PT Re-evaluation, 97110-Therapeutic exercises, 97530- Therapeutic activity, 97112- Neuromuscular re-education, 97535- Self Care, [id number]- Manual therapy, Patient/Family education, Taping, Dry Needling, Joint mobilization, Cryotherapy, and Moist heat  PLAN FOR NEXT SESSION: review and progress exercises; education in positioning for UE to promote circulation and improve comfort; manual work and modalities as indicated    Maeleigh Buschman P Krishna Dancel, PT 09/13/2023, 3:23 PM

## 2023-09-13 ENCOUNTER — Ambulatory Visit: Attending: Family Medicine | Admitting: Rehabilitative and Restorative Service Providers"

## 2023-09-13 ENCOUNTER — Other Ambulatory Visit: Payer: Self-pay

## 2023-09-13 ENCOUNTER — Encounter: Payer: Self-pay | Admitting: Rehabilitative and Restorative Service Providers"

## 2023-09-13 DIAGNOSIS — M6281 Muscle weakness (generalized): Secondary | ICD-10-CM | POA: Insufficient documentation

## 2023-09-13 DIAGNOSIS — I890 Lymphedema, not elsewhere classified: Secondary | ICD-10-CM | POA: Diagnosis present

## 2023-09-13 DIAGNOSIS — M25512 Pain in left shoulder: Secondary | ICD-10-CM | POA: Diagnosis present

## 2023-09-13 DIAGNOSIS — G8929 Other chronic pain: Secondary | ICD-10-CM | POA: Diagnosis present

## 2023-09-13 DIAGNOSIS — M25511 Pain in right shoulder: Secondary | ICD-10-CM | POA: Insufficient documentation

## 2023-09-13 DIAGNOSIS — R29898 Other symptoms and signs involving the musculoskeletal system: Secondary | ICD-10-CM | POA: Insufficient documentation

## 2023-09-15 ENCOUNTER — Other Ambulatory Visit: Payer: Self-pay | Admitting: Family Medicine

## 2023-09-15 ENCOUNTER — Ambulatory Visit: Payer: Self-pay

## 2023-09-15 DIAGNOSIS — R413 Other amnesia: Secondary | ICD-10-CM

## 2023-09-15 DIAGNOSIS — G8929 Other chronic pain: Secondary | ICD-10-CM

## 2023-09-15 DIAGNOSIS — M25511 Pain in right shoulder: Secondary | ICD-10-CM | POA: Diagnosis not present

## 2023-09-15 DIAGNOSIS — R7301 Impaired fasting glucose: Secondary | ICD-10-CM

## 2023-09-15 DIAGNOSIS — M6281 Muscle weakness (generalized): Secondary | ICD-10-CM

## 2023-09-15 DIAGNOSIS — I10 Essential (primary) hypertension: Secondary | ICD-10-CM

## 2023-09-15 DIAGNOSIS — R29898 Other symptoms and signs involving the musculoskeletal system: Secondary | ICD-10-CM

## 2023-09-15 NOTE — Therapy (Signed)
 OUTPATIENT PHYSICAL THERAPY SHOULDER TREATMENT   Patient Name: Joann Maddox MRN: [id number] DOB:May 23, 1940, 83 y.o., female Today's Date: 09/15/2023  END OF SESSION:  PT End of Session - 09/15/23 1020     Visit Number 2    Number of Visits 16    Date for PT Re-Evaluation 11/08/23    Authorization Type blue medicare; [employer] [place]    Progress Note Due on Visit 10    PT Start Time 1020    PT Stop Time 1103    PT Time Calculation (min) 43 min    Activity Tolerance Patient tolerated treatment well    Behavior During Therapy WFL for tasks assessed/performed             Past Medical History:  Diagnosis Date   Allergy    Anxiety    Arthritis    Cataract    Depression    GERD (gastroesophageal reflux disease)    Headache(784.0)    migrains   Heart murmur    Hypothyroidism    Osteoporosis 08/12/2023   DEXA 4/25: t score of    Pre-diabetes    Rheumatoid arthritis(714.0)    Past Surgical History:  Procedure Laterality Date   ABDOMINAL HYSTERECTOMY     ANTERIOR CERVICAL DECOMP/DISCECTOMY FUSION N/A 01/03/2018   Procedure: ANTERIOR CERVICAL DECOMPRESSION/DISCECTOMY FUSION, ALLOGRAFT, PLATE;  Surgeon: Adah Acron, MD;  Location: [facility] OR;  Service: Orthopedics;  Laterality: N/A;   APPENDECTOMY     CHOLECYSTECTOMY     JOINT REPLACEMENT     Both hips and both knees   REPLACEMENT TOTAL KNEE BILATERAL     SPINE SURGERY     TONSILLECTOMY     as  a child   TOTAL HIP ARTHROPLASTY     right and left   Patient Active Problem List   Diagnosis Date Noted   Osteoporosis 08/12/2023   Excessive sleepiness 04/01/2023   Unsteady gait 04/03/2022   Bilateral hearing loss 03/12/2022   Urinary incontinence 12/25/2021   Mild dementia (HCC) 06/24/2021   Coccydynia 12/23/2020   Gastroesophageal reflux disease with esophagitis without hemorrhage 04/05/2020   Iron deficiency anemia 06/22/2019   Lymphatic edema 12/06/2018   Sleep disturbance 12/05/2018   Venous stasis dermatitis of both  lower extremities 09/20/2018   Varicose veins of both lower extremities 08/22/2018   Depression, major, single episode, mild (HCC) 08/22/2018   Falls frequently 08/12/2018   Mass of thigh, right 05/17/2018   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   Status post cervical spinal fusion 01/11/2018   Chronic neck pain 10/20/2017   Chronic venous insufficiency 04/29/2017   IFG (impaired fasting glucose) 05/09/2014   Chronic constipation 02/19/2014   Rosacea 11/06/2013   GAD (generalized anxiety disorder) 06/08/2013   Essential hypertension, benign 05/04/2013   Migraine with aura 05/04/2013   Murmur, heart 05/04/2013   Hyperlipidemia 05/04/2013   Subclinical hyperthyroidism 05/04/2013   Fatigue 02/16/2012   Mixed stress and urge urinary incontinence 02/16/2012   Hyperglycemia 01/12/2012    PCP: Dr Cydney Draft  REFERRING PROVIDER: Dr Cydney Draft  REFERRING DIAG: R shoulder pain   THERAPY DIAG:  Acute pain of right shoulder  Chronic pain of both shoulders  Other symptoms and signs involving the musculoskeletal system  Muscle weakness (generalized)  Rationale for Evaluation and Treatment: Rehabilitation  ONSET DATE: 07/10/23  SUBJECTIVE:  SUBJECTIVE STATEMENT: Patient reports yesterday her Rt shoulder was hurting but is feeling better today.   EVAL: Patient reports that she has had increased pain in the R shoulder which has increased in the past two months. She does not know of any injury or irritation. She has a history to R humeral fracture several years  Hand dominance: Right  PERTINENT HISTORY: multiple joint arthritis; HTN, varicose veins, CVI, venous stasis dermatitis, sleep disturbance, B THA, B TKA, OA, RA, HYPOTHYROIDISM, GAD, Frequent Falls, Mild dementia,  urinary incontinence, unsteady gait, B hearing loss LBP; DDD cervical spine: chronic R shoulder pain with proximal humeral fx; L shoulder tendonitis - scheduled for reverse shoulder replacement; bilat LE lymphedema   PAIN:  Are you having pain? Yes: NPRS scale: 6/10  Pain location: R shoulder  Pain description: dull ache Aggravating factors: using R arm  Relieving factors: not doing anything with R arm   PRECAUTIONS: Other: poor mobility; requires assist for ADL's including transfers   RED FLAGS: None   WEIGHT BEARING RESTRICTIONS: No  FALLS:  Has patient fallen in last 6 months? No  LIVING ENVIRONMENT: Lives with: lives with their spouse Lives in: House/apartment Stairs: No Has following equipment at home: Single point cane, Environmental consultant - 2 wheeled, Wheelchair (manual), Tour manager, and Grab bars  OCCUPATION: Retired; sedentary lifestyle  PLOF: Needs assistance with ADLs  PATIENT GOALS:get her shoulder better - so that it is not painful and she can use it better.   NEXT MD VISIT: 12/06/23  OBJECTIVE:  Note: Objective measures were completed at Evaluation unless otherwise noted.  DIAGNOSTIC FINDINGS:  Xray: 4/25: 1. Remote proximal humerus fracture, healed and unchanged in alignment from prior exam. 2. Moderate glenohumeral degenerative change. 3. Increased acromioclavicular spurring. Irregularity of the distal clavicle may represent sequela of remote injury, but new from 2020.  PATIENT SURVEYS:  Modified Quick Dash: 59.1%; 59.1/100 COGNITION: Overall cognitive status: memory deficits followed by [facility]     SENSATION: WFL  POSTURE: Head forward; shoulders rounded and elevated; head of the humerus anterior in orientation; scapulae abducted and rotated along the thoracic wall  UPPER EXTREMITY ROM: pain with AROM R shoulder motion   Active ROM Right eval Left eval  Shoulder flexion 108 99  Shoulder extension 48 47  Shoulder abduction 120 85 in some  abduction  Shoulder adduction    Shoulder internal rotation    Shoulder external rotation 38 27  Elbow flexion    Elbow extension    Wrist flexion    Wrist extension    Wrist ulnar deviation    Wrist radial deviation    Wrist pronation    Wrist supination    (Blank rows = not tested)  UPPER EXTREMITY MMT: strength assessed in available ranges   MMT Right eval Left eval  Shoulder flexion 3+ 3+  Shoulder extension 4- 4  Shoulder abduction 3+ 3+  Shoulder adduction    Shoulder internal rotation    Shoulder external rotation 3- 3  Middle trapezius    Lower trapezius    Elbow flexion    Elbow extension    Wrist flexion    Wrist extension    Wrist ulnar deviation    Wrist radial deviation    Wrist pronation    Wrist supination    Grip strength (lbs)    (Blank rows = not tested)  PALPATION:  Muscular tightness through the ant/lat/posterior cervical musculature; pecs; upper trap; thoracic paraspinals  [facility] Adult PT Treatment:                                                DATE: 09/15/2023 Therapeutic Exercise: Seated: Scap squeezes with pool noodle 10x5" Bent arm abd --> palm down & palm up Manual Therapy: Supine --> shoulder PROM flexion & abduction --> scap stability with hand Neuromuscular re-ed: Shoulder isometrics --> assist from therapist: Abduction External rotation extension   TREATMENT DATE: see exercise program - isometric exercises were modified for patient in sitting; husband and caregiver assisting with exercises    PATIENT EDUCATION: Education details: POC; HEP  Person educated: Patient and Spouse Education method: Explanation, Demonstration, Tactile cues, Verbal cues, and Handouts Education comprehension: verbalized understanding, returned demonstration, verbal cues required, tactile cues required, and needs further  education  HOME EXERCISE PROGRAM: Access Code: [email or web address] URL: [email or web address] Date: 09/13/2023 Prepared by: [name] Holt  Exercises - Seated Scapular Retraction  - 2 x daily - 7 x weekly - 1-2 sets - 10 reps - 10 sec  hold - Seated Single Arm Shoulder Abduction with Elbow Bent and Dumbbell  - 2 x daily - 7 x weekly - 1 sets - 3 reps - 30 sec  hold - Isometric Shoulder Abduction at Wall  - 2 x daily - 7 x weekly - 1 sets - 5-10 reps - 5 sec  hold - Isometric Shoulder Extension at Wall  - 2 x daily - 7 x weekly - 1 sets - 5-10 reps - 5 sec  hold - Standing Isometric Shoulder External Rotation with Doorway  - 2 x daily - 7 x weekly - 1 sets - 5-10 reps - 5 sec  hold - Isometric Shoulder Flexion at Wall  - 2 x daily - 7 x weekly - 1 sets - 5-10 reps - 5 sec  hold  ASSESSMENT:  CLINICAL IMPRESSION: Shoulder isometrics reviewed, providing tactile cue for scapula stability awareness. Bent arm shoulder abd performed in pain-free range, focusing on deltoid activation. Patient demonstrated improved shoulder mobility and increased pain-free range of motion with shoulder abduction; patient responded well with scapula stability assist during shoulder passive ROM.   EVAL: Patient is an 83 y.o. female who was seen today for physical therapy evaluation and treatment for acute pain of R shoulder. Patient has a history of chronic R shoulder pain; cervical fusion with continued LBP; arthritis on cervical and lumbar spine; multiple joints. He has had bilat THA's and TKA's. Patient and husband report increase in R shoulder pain in the past 2 months. She presents with poor posture and alignment; decreased AROM; decreased strength; decreased functional activities; pain on a constant basis. Patient will benefit from PT to address problems identified.   OBJECTIVE IMPAIRMENTS: decreased ROM, decreased strength, increased fascial restrictions, impaired UE functional use, improper body mechanics,  postural dysfunction, obesity, and pain.   ACTIVITY LIMITATIONS: carrying, lifting, bending, sitting, standing, squatting, sleeping, stairs, transfers, bed mobility, bathing, dressing, reach over head, and hygiene/grooming  PARTICIPATION LIMITATIONS: limited functional activities   PERSONAL FACTORS: Behavior pattern, Fitness, Past/current experiences, and Time since onset of injury/illness/exacerbation are also affecting patient's functional outcome.   REHAB POTENTIAL: Good  CLINICAL DECISION MAKING: Evolving/moderate complexity  EVALUATION COMPLEXITY: Moderate   GOALS: Goals reviewed with patient? Yes  SHORT TERM GOALS: Target date: 10/11/2023   Independent in initial HEP with husband's assistance  Baseline: Goal status: INITIAL  2.  Patient reports increased functional use of R LE for ADLs including dressing and grooming with minimal to no increase in pain  Baseline:  Goal status: INITIAL  3.  Patient demonstrates palpable activation of middle and lower trap for elevation of R shoulder  Baseline:  Goal status: INITIAL   LONG TERM GOALS: Target date: 11/08/2023   Increase AROM R shoulder by 5-7 degrees  Baseline:  Goal status: INITIAL  2.  Increase strength R shoulder strength in flex; abd; ER by 1/2 to 1 muscle grade  Baseline:  Goal status: INITIAL  3.  Patient demonstrates ability to reach to top of head with R UE to facilitate grooming and self care such as combing hair  Baseline:  Goal status: INITIAL  4.  Independent in HEP  Baseline:  Goal status: INITIAL  5.  Improve Modified Quick Dash  Baseline: 59.1%; 59.1/100 Goal status: INITIAL  PLAN:  PT FREQUENCY: 2x/week  PT DURATION: 8 weeks  PLANNED INTERVENTIONS: 97164- PT Re-evaluation, 97110-Therapeutic exercises, 97530- Therapeutic activity, 97112- Neuromuscular re-education, 97535- Self Care, 54098- Manual therapy, Patient/Family education, Taping, Dry Needling, Joint mobilization, Cryotherapy, and  Moist heat  PLAN FOR NEXT SESSION: review and progress exercises; education in positioning for UE to promote circulation and improve comfort; manual work and modalities as indicated    Flint Hummer, PTA 09/15/2023, 11:05 AM

## 2023-09-22 ENCOUNTER — Encounter: Payer: Self-pay | Admitting: Rehabilitative and Restorative Service Providers"

## 2023-09-22 ENCOUNTER — Ambulatory Visit: Admitting: Rehabilitative and Restorative Service Providers"

## 2023-09-22 DIAGNOSIS — G8929 Other chronic pain: Secondary | ICD-10-CM

## 2023-09-22 DIAGNOSIS — R29898 Other symptoms and signs involving the musculoskeletal system: Secondary | ICD-10-CM

## 2023-09-22 DIAGNOSIS — M25511 Pain in right shoulder: Secondary | ICD-10-CM

## 2023-09-22 DIAGNOSIS — M6281 Muscle weakness (generalized): Secondary | ICD-10-CM

## 2023-09-22 DIAGNOSIS — I890 Lymphedema, not elsewhere classified: Secondary | ICD-10-CM

## 2023-09-22 NOTE — Therapy (Signed)
 OUTPATIENT PHYSICAL THERAPY SHOULDER TREATMENT   Patient Name: Joann Maddox MRN: [id number] DOB:Aug 08, 1940, 83 y.o., female Today's Date: 09/22/2023  END OF SESSION:  PT End of Session - 09/22/23 1404     Visit Number 3    Number of Visits 16    Authorization Type blue medicare; [employer]    Progress Note Due on Visit 10    PT Start Time 1400    PT Stop Time 1445    PT Time Calculation (min) 45 min    Activity Tolerance Patient tolerated treatment well             Past Medical History:  Diagnosis Date   Allergy    Anxiety    Arthritis    Cataract    Depression    GERD (gastroesophageal reflux disease)    Headache(784.0)    migrains   Heart murmur    Hypothyroidism    Osteoporosis 08/12/2023   DEXA 4/25: t score of    Pre-diabetes    Rheumatoid arthritis(714.0)    Past Surgical History:  Procedure Laterality Date   ABDOMINAL HYSTERECTOMY     ANTERIOR CERVICAL DECOMP/DISCECTOMY FUSION N/A 01/03/2018   Procedure: ANTERIOR CERVICAL DECOMPRESSION/DISCECTOMY FUSION, ALLOGRAFT, PLATE;  Surgeon: Adah Acron, MD;  Location: [facility] OR;  Service: Orthopedics;  Laterality: N/A;   APPENDECTOMY     CHOLECYSTECTOMY     JOINT REPLACEMENT     Both hips and both knees   REPLACEMENT TOTAL KNEE BILATERAL     SPINE SURGERY     TONSILLECTOMY     as  a child   TOTAL HIP ARTHROPLASTY     right and left   Patient Active Problem List   Diagnosis Date Noted   Osteoporosis 08/12/2023   Excessive sleepiness 04/01/2023   Unsteady gait 04/03/2022   Bilateral hearing loss 03/12/2022   Urinary incontinence 12/25/2021   Mild dementia (HCC) 06/24/2021   Coccydynia 12/23/2020   Gastroesophageal reflux disease with esophagitis without hemorrhage 04/05/2020   Iron deficiency anemia 06/22/2019   Lymphatic edema 12/06/2018   Sleep disturbance 12/05/2018   Venous stasis dermatitis of both lower extremities 09/20/2018   Varicose veins of both lower extremities 08/22/2018   Depression,  major, single episode, mild (HCC) 08/22/2018   Falls frequently 08/12/2018   Mass of thigh, right 05/17/2018   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   Status post cervical spinal fusion 01/11/2018   Chronic neck pain 10/20/2017   Chronic venous insufficiency 04/29/2017   IFG (impaired fasting glucose) 05/09/2014   Chronic constipation 02/19/2014   Rosacea 11/06/2013   GAD (generalized anxiety disorder) 06/08/2013   Essential hypertension, benign 05/04/2013   Migraine with aura 05/04/2013   Murmur, heart 05/04/2013   Hyperlipidemia 05/04/2013   Subclinical hyperthyroidism 05/04/2013   Fatigue 02/16/2012   Mixed stress and urge urinary incontinence 02/16/2012   Hyperglycemia 01/12/2012    PCP: Dr Cydney Draft  REFERRING PROVIDER: Dr Cydney Draft  REFERRING DIAG: R shoulder pain   THERAPY DIAG:  Acute pain of right shoulder  Chronic pain of both shoulders  Other symptoms and signs involving the musculoskeletal system  Muscle weakness (generalized)  Lymphedema, not elsewhere classified  Rationale for Evaluation and Treatment: Rehabilitation  ONSET DATE: 07/10/23  SUBJECTIVE:  SUBJECTIVE STATEMENT: Patient reports yesterday her Rt shoulder has not been hurting ad today. Both shoulders hurt, but the R is the worst.    EVAL: Patient reports that she has had increased pain in the R shoulder which has increased in the past two months. She does not know of any injury or irritation. She has a history to R humeral fracture several years  Hand dominance: Right  PERTINENT HISTORY: multiple joint arthritis; HTN, varicose veins, CVI, venous stasis dermatitis, sleep disturbance, B THA, B TKA, OA, RA, HYPOTHYROIDISM, GAD, Frequent Falls, Mild dementia, urinary incontinence,  unsteady gait, B hearing loss LBP; DDD cervical spine: chronic R shoulder pain with proximal humeral fx; L shoulder tendonitis - scheduled for reverse shoulder replacement; bilat LE lymphedema   PAIN:  Are you having pain? Yes: NPRS scale: 6-7/10  Pain location: R shoulder  Pain description: dull ache Aggravating factors: using R arm  Relieving factors: not doing anything with R arm   PRECAUTIONS: Other: poor mobility; requires assist for ADL's including transfers   WEIGHT BEARING RESTRICTIONS: No  FALLS:  Has patient fallen in last 6 months? No  LIVING ENVIRONMENT: Lives with: lives with their spouse Lives in: House/apartment Stairs: No Has following equipment at home: Single point cane, Environmental consultant - 2 wheeled, Wheelchair (manual), Tour manager, and Grab bars  OCCUPATION: Retired; sedentary lifestyle  PLOF: Needs assistance with ADLs  PATIENT GOALS:get her shoulder better - so that it is not painful and she can use it better.   NEXT MD VISIT: 12/06/23  OBJECTIVE:  Note: Objective measures were completed at Evaluation unless otherwise noted.  DIAGNOSTIC FINDINGS:  Xray: 4/25: 1. Remote proximal humerus fracture, healed and unchanged in alignment from prior exam. 2. Moderate glenohumeral degenerative change. 3. Increased acromioclavicular spurring. Irregularity of the distal clavicle may represent sequela of remote injury, but new from 2020.  PATIENT SURVEYS:  Modified Quick Dash: 59.1%; 59.1/100 COGNITION: Overall cognitive status: memory deficits followed by [facility]     SENSATION: WFL  POSTURE: Head forward; shoulders rounded and elevated; head of the humerus anterior in orientation; scapulae abducted and rotated along the thoracic wall  UPPER EXTREMITY ROM: pain with AROM R shoulder motion   Active ROM Right eval Left eval  Shoulder flexion 108 99  Shoulder extension 48 47  Shoulder abduction 120 85 in some abduction  Shoulder adduction     Shoulder internal rotation    Shoulder external rotation 38 27  Elbow flexion    Elbow extension    Wrist flexion    Wrist extension    Wrist ulnar deviation    Wrist radial deviation    Wrist pronation    Wrist supination    (Blank rows = not tested)  UPPER EXTREMITY MMT: strength assessed in available ranges   MMT Right eval Left eval  Shoulder flexion 3+ 3+  Shoulder extension 4- 4  Shoulder abduction 3+ 3+  Shoulder adduction    Shoulder internal rotation    Shoulder external rotation 3- 3  Middle trapezius    Lower trapezius    Elbow flexion    Elbow extension    Wrist flexion    Wrist extension    Wrist ulnar deviation    Wrist radial deviation    Wrist pronation    Wrist supination    Grip strength (lbs)    (Blank rows = not tested)  PALPATION:  Muscular tightness through the ant/lat/posterior cervical musculature; pecs; upper trap; thoracic paraspinals  [facility] Adult PT Treatment:                                                DATE: 09/22/2023 Therapeutic Exercise: Seated: Scap squeezes with pool noodle 10x5" Backward shoulder rolls  Chin tuck sitting with noodle along spine x 5  Lateral cervical flexion R/L with noodle x 5  Cervical rotation R/L with noodle x 5  Bent arm abd --> palm down & palm up with PT assist x 15 Manual Therapy: Sitting  --> R/L shoulder PROM flexion & abduction --> scap stability with hand Therapeutic activities:   Shoulder abduction yellow TB around shoulders and chest bilat 2-3 sec x 15  Isometric abduction bilat UE's 3 sec x 10   Isometric extension bilat UE's 3 sec x 10  Isometric ER R/L UE 3 sec x 10   Isometric IR R/L UE 3 sec x 10   Shoulder flexion rolling rollator forward from sitting patient's hands on handles x 10  Neuromuscular re-ed: Working on posture and alignment focus on scap squeeze with noodle Backward shoulder rolls                                                                                                                          [facility] Adult PT Treatment:                                                DATE: 09/15/2023 Therapeutic Exercise: Seated: Scap squeezes with pool noodle 10x5" Bent arm abd --> palm down & palm up Manual Therapy: Supine --> shoulder PROM flexion & abduction --> scap stability with hand Neuromuscular re-ed: Shoulder isometrics --> assist from therapist: Abduction External rotation extension   TREATMENT DATE: see exercise program - isometric exercises were modified for patient in sitting; husband and caregiver assisting with exercises    PATIENT EDUCATION: Education details: POC; HEP  Person educated: Patient and Spouse Education method: Explanation, Demonstration, Tactile cues, Verbal cues, and Handouts Education comprehension: verbalized understanding, returned demonstration, verbal cues required, tactile cues required, and needs further education  HOME EXERCISE PROGRAM: Access Code: [email or web address] URL: [email or web address] Date: 09/13/2023 Prepared by: Deion Forgue  Exercises - Seated Scapular Retraction  - 2 x daily - 7 x weekly - 1-2 sets - 10 reps - 10 sec  hold - Seated Single Arm Shoulder Abduction with Elbow Bent and Dumbbell  - 2 x daily - 7 x weekly - 1 sets - 3 reps - 30 sec  hold - Isometric Shoulder Abduction at Wall  - 2 x daily - 7 x weekly - 1 sets - 5-10 reps - 5 sec  hold - Isometric Shoulder Extension at Wall  - 2 x daily -  7 x weekly - 1 sets - 5-10 reps - 5 sec  hold - Standing Isometric Shoulder External Rotation with Doorway  - 2 x daily - 7 x weekly - 1 sets - 5-10 reps - 5 sec  hold - Isometric Shoulder Flexion at Wall  - 2 x daily - 7 x weekly - 1 sets - 5-10 reps - 5 sec  hold  ASSESSMENT:  CLINICAL IMPRESSION: Shoulder isometrics in all planes. Bent arm shoulder abd performed in pain-free range, focusing on deltoid activation; added yellow TB for light resistance. Use of rollator to work on AA shoulder flexion.  Patient demonstrated improved shoulder mobility and increased pain-free range of motion with exercises and PROM. Patient tolerated treatment well. Caregiver, Clide Dalton, observing and videoing exercise with rollator and yellor TB on her phone. She will forward to Jefferson, patient's husband.   EVAL: Patient is an 83 y.o. female who was seen today for physical therapy evaluation and treatment for acute pain of R shoulder. Patient has a history of chronic R shoulder pain; cervical fusion with continued LBP; arthritis on cervical and lumbar spine; multiple joints. He has had bilat THA's and TKA's. Patient and husband report increase in R shoulder pain in the past 2 months. She presents with poor posture and alignment; decreased AROM; decreased strength; decreased functional activities; pain on a constant basis. Patient will benefit from PT to address problems identified.   OBJECTIVE IMPAIRMENTS: decreased ROM, decreased strength, increased fascial restrictions, impaired UE functional use, improper body mechanics, postural dysfunction, obesity, and pain.    GOALS: Goals reviewed with patient? Yes  SHORT TERM GOALS: Target date: 10/11/2023   Independent in initial HEP with husband's assistance  Baseline: Goal status: INITIAL  2.  Patient reports increased functional use of R LE for ADLs including dressing and grooming with minimal to no increase in pain  Baseline:  Goal status: INITIAL  3.  Patient demonstrates palpable activation of middle and lower trap for elevation of R shoulder  Baseline:  Goal status: INITIAL   LONG TERM GOALS: Target date: 11/08/2023   Increase AROM R shoulder by 5-7 degrees  Baseline:  Goal status: INITIAL  2.  Increase strength R shoulder strength in flex; abd; ER by 1/2 to 1 muscle grade  Baseline:  Goal status: INITIAL  3.  Patient demonstrates ability to reach to top of head with R UE to facilitate grooming and self care such as combing hair  Baseline:  Goal  status: INITIAL  4.  Independent in HEP  Baseline:  Goal status: INITIAL  5.  Improve Modified Quick Dash  Baseline: 59.1%; 59.1/100 Goal status: INITIAL  PLAN:  PT FREQUENCY: 2x/week  PT DURATION: 8 weeks  PLANNED INTERVENTIONS: 97164- PT Re-evaluation, 97110-Therapeutic exercises, 97530- Therapeutic activity, 97112- Neuromuscular re-education, 97535- Self Care, 16109- Manual therapy, Patient/Family education, Taping, Dry Needling, Joint mobilization, Cryotherapy, and Moist heat  PLAN FOR NEXT SESSION: review and progress exercises; education in positioning for UE to promote circulation and improve comfort; manual work and modalities as indicated    Samay Delcarlo P Keone Kamer, PT 09/22/2023, 2:04 PM

## 2023-09-27 ENCOUNTER — Ambulatory Visit: Attending: Family Medicine | Admitting: Rehabilitative and Restorative Service Providers"

## 2023-09-27 ENCOUNTER — Encounter: Payer: Self-pay | Admitting: Rehabilitative and Restorative Service Providers"

## 2023-09-27 DIAGNOSIS — M25512 Pain in left shoulder: Secondary | ICD-10-CM | POA: Diagnosis present

## 2023-09-27 DIAGNOSIS — G8929 Other chronic pain: Secondary | ICD-10-CM

## 2023-09-27 DIAGNOSIS — I890 Lymphedema, not elsewhere classified: Secondary | ICD-10-CM

## 2023-09-27 DIAGNOSIS — R29898 Other symptoms and signs involving the musculoskeletal system: Secondary | ICD-10-CM | POA: Diagnosis present

## 2023-09-27 DIAGNOSIS — M25511 Pain in right shoulder: Secondary | ICD-10-CM | POA: Diagnosis present

## 2023-09-27 DIAGNOSIS — M6281 Muscle weakness (generalized): Secondary | ICD-10-CM | POA: Diagnosis present

## 2023-09-27 NOTE — Therapy (Signed)
 OUTPATIENT PHYSICAL THERAPY SHOULDER TREATMENT   Patient Name: Joann Maddox MRN: [id number] DOB:Sep 09, 1940, 83 y.o., female Today's Date: 09/27/2023  END OF SESSION:  PT End of Session - 09/27/23 1314     Visit Number 4    Number of Visits 16    Date for PT Re-Evaluation 11/08/23    Authorization Type [employer]; [employer] [place]    Progress Note Due on Visit 10    PT Start Time 1315    PT Stop Time 1400    PT Time Calculation (min) 45 min             Past Medical History:  Diagnosis Date   Allergy    Anxiety    Arthritis    Cataract    Depression    GERD (gastroesophageal reflux disease)    Headache(784.0)    migrains   Heart murmur    Hypothyroidism    Osteoporosis 08/12/2023   DEXA 4/25: t score of    Pre-diabetes    Rheumatoid arthritis(714.0)    Past Surgical History:  Procedure Laterality Date   ABDOMINAL HYSTERECTOMY     ANTERIOR CERVICAL DECOMP/DISCECTOMY FUSION N/A 01/03/2018   Procedure: ANTERIOR CERVICAL DECOMPRESSION/DISCECTOMY FUSION, ALLOGRAFT, PLATE;  Surgeon: Adah Acron, MD;  Location: [facility] OR;  Service: Orthopedics;  Laterality: N/A;   APPENDECTOMY     CHOLECYSTECTOMY     JOINT REPLACEMENT     Both hips and both knees   REPLACEMENT TOTAL KNEE BILATERAL     SPINE SURGERY     TONSILLECTOMY     as  a child   TOTAL HIP ARTHROPLASTY     right and left   Patient Active Problem List   Diagnosis Date Noted   Osteoporosis 08/12/2023   Excessive sleepiness 04/01/2023   Unsteady gait 04/03/2022   Bilateral hearing loss 03/12/2022   Urinary incontinence 12/25/2021   Mild dementia (HCC) 06/24/2021   Coccydynia 12/23/2020   Gastroesophageal reflux disease with esophagitis without hemorrhage 04/05/2020   Iron deficiency anemia 06/22/2019   Lymphatic edema 12/06/2018   Sleep disturbance 12/05/2018   Venous stasis dermatitis of both lower extremities 09/20/2018   Varicose veins of both lower extremities 08/22/2018   Depression, major, single  episode, mild (HCC) 08/22/2018   Falls frequently 08/12/2018   Mass of thigh, right 05/17/2018   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   Status post cervical spinal fusion 01/11/2018   Chronic neck pain 10/20/2017   Chronic venous insufficiency 04/29/2017   IFG (impaired fasting glucose) 05/09/2014   Chronic constipation 02/19/2014   Rosacea 11/06/2013   GAD (generalized anxiety disorder) 06/08/2013   Essential hypertension, benign 05/04/2013   Migraine with aura 05/04/2013   Murmur, heart 05/04/2013   Hyperlipidemia 05/04/2013   Subclinical hyperthyroidism 05/04/2013   Fatigue 02/16/2012   Mixed stress and urge urinary incontinence 02/16/2012   Hyperglycemia 01/12/2012    PCP: Dr Cydney Draft  REFERRING PROVIDER: Dr Cydney Draft  REFERRING DIAG: R shoulder pain   THERAPY DIAG:  Acute pain of right shoulder  Chronic pain of both shoulders  Other symptoms and signs involving the musculoskeletal system  Muscle weakness (generalized)  Lymphedema, not elsewhere classified  Rationale for Evaluation and Treatment: Rehabilitation  ONSET DATE: 07/10/23  SUBJECTIVE:  SUBJECTIVE STATEMENT: Patient's caregiver reports that they did some of the exercises Friday but not so many over the weekend. Both shoulders hurt, but the L is the worst. Feels better after therapy today.   EVAL: Patient reports that she has had increased pain in the R shoulder which has increased in the past two months. She does not know of any injury or irritation. She has a history to R humeral fracture several years  Hand dominance: Right  PERTINENT HISTORY: multiple joint arthritis; HTN, varicose veins, CVI, venous stasis dermatitis, sleep disturbance, B THA, B TKA, OA, RA, HYPOTHYROIDISM, GAD,  Frequent Falls, Mild dementia, urinary incontinence, unsteady gait, B hearing loss LBP; DDD cervical spine: chronic R shoulder pain with proximal humeral fx; L shoulder tendonitis - scheduled for reverse shoulder replacement; bilat LE lymphedema   PAIN:  Are you having pain? Yes: NPRS scale: 3/10  Pain location: R shoulder; L shoulder   Pain description: dull ache Aggravating factors: using R arm  Relieving factors: not doing anything with R arm   PRECAUTIONS: Other: poor mobility; requires assist for ADL's including transfers   WEIGHT BEARING RESTRICTIONS: No  FALLS:  Has patient fallen in last 6 months? No  LIVING ENVIRONMENT: Lives with: lives with their spouse Lives in: House/apartment Stairs: No Has following equipment at home: Single point cane, Environmental consultant - 2 wheeled, Wheelchair (manual), Tour manager, and Grab bars  OCCUPATION: Retired; sedentary lifestyle  PLOF: Needs assistance with ADLs  PATIENT GOALS:get her shoulder better - so that it is not painful and she can use it better.   NEXT MD VISIT: 12/06/23  OBJECTIVE:  Note: Objective measures were completed at Evaluation unless otherwise noted.  DIAGNOSTIC FINDINGS:  Xray: 4/25: 1. Remote proximal humerus fracture, healed and unchanged in alignment from prior exam. 2. Moderate glenohumeral degenerative change. 3. Increased acromioclavicular spurring. Irregularity of the distal clavicle may represent sequela of remote injury, but new from 2020.  PATIENT SURVEYS:  Modified Quick Dash: 59.1%; 59.1/100 COGNITION: Overall cognitive status: memory deficits followed by [facility]     SENSATION: WFL  POSTURE: Head forward; shoulders rounded and elevated; head of the humerus anterior in orientation; scapulae abducted and rotated along the thoracic wall  UPPER EXTREMITY ROM: pain with AROM R shoulder motion   Active ROM Right eval Left eval  Shoulder flexion 108 99  Shoulder extension 48 47  Shoulder  abduction 120 85 in some abduction  Shoulder adduction    Shoulder internal rotation    Shoulder external rotation 38 27  Elbow flexion    Elbow extension    Wrist flexion    Wrist extension    Wrist ulnar deviation    Wrist radial deviation    Wrist pronation    Wrist supination    (Blank rows = not tested)  UPPER EXTREMITY MMT: strength assessed in available ranges   MMT Right eval Left eval  Shoulder flexion 3+ 3+  Shoulder extension 4- 4  Shoulder abduction 3+ 3+  Shoulder adduction    Shoulder internal rotation    Shoulder external rotation 3- 3  Middle trapezius    Lower trapezius    Elbow flexion    Elbow extension    Wrist flexion    Wrist extension    Wrist ulnar deviation    Wrist radial deviation    Wrist pronation    Wrist supination    Grip strength (lbs)    (Blank rows = not tested)  PALPATION:  Muscular  tightness through the ant/lat/posterior cervical musculature; pecs; upper trap; thoracic paraspinals      [facility] Adult PT Treatment:                                                DATE: 09/27/2023 Therapeutic Exercise: Seated: Scap squeezes with pool noodle 10x5" Backward shoulder rolls  Backward shoulder rolls alternating sides  Lateral cervical flexion R/L with noodle x 5  Cervical rotation R/L with noodle x 5  Bent arm abd --> palm down & palm up with PT assist x 15 Manual Therapy: Sitting  --> R/L shoulder PROM flexion & abduction --> scap stability with hand Therapeutic activities:   Shoulder abduction yellow TB around shoulders and chest bilat 2-3 sec x 15  Isometric abduction bilat UE's 3 sec x 10   Isometric extension bilat UE's 3 sec x 10  Isometric ER R/L UE 3 sec x 10   Isometric IR R/L UE 3 sec x 10   Shoulder flexion rolling rollator forward from sitting patient's hands on handles x 10   Pressing ball with both hands working on horizontal adduction   Prayer hands with forward elevation of shoulders to patient tolerance x 5   Neuromuscular re-ed: Working on posture and alignment focus on scap squeeze with noodle Backward shoulder rolls  Working on cervical posture and alignment  Chin tuck sitting with noodle along spine x 5  Chin tuck against coregeous ball 5 sec x 5 Chin tuck with yellow TB back of head PT providing resistance 5 sec x 5     [facility] Adult PT Treatment:                                                DATE: 09/22/2023 Therapeutic Exercise: Seated: Scap squeezes with pool noodle 10x5" Backward shoulder rolls  Chin tuck sitting with noodle along spine x 5  Lateral cervical flexion R/L with noodle x 5  Cervical rotation R/L with noodle x 5  Bent arm abd --> palm down & palm up with PT assist x 15 Manual Therapy: Sitting  --> R/L shoulder PROM flexion & abduction --> scap stability with hand Therapeutic activities:   Shoulder abduction yellow TB around shoulders and chest bilat 2-3 sec x 15  Isometric abduction bilat UE's 3 sec x 10   Isometric extension bilat UE's 3 sec x 10  Isometric ER R/L UE 3 sec x 10   Isometric IR R/L UE 3 sec x 10   Shoulder flexion rolling rollator forward from sitting patient's hands on handles x 10  Neuromuscular re-ed: Working on posture and alignment focus on scap squeeze with noodle Backward shoulder rolls  PATIENT EDUCATION: Education details: POC; HEP  Person educated: Patient and Spouse Education method: Explanation, Demonstration, Actor cues, Verbal cues, and Handouts Education comprehension: verbalized understanding, returned demonstration, verbal cues required, tactile cues required, and needs further education  HOME EXERCISE PROGRAM: Access Code: [email or web address] URL: [email or web address] Date: 09/13/2023 Prepared by: Tyrel Lex  Exercises - Seated Scapular Retraction  - 2 x daily - 7 x weekly - 1-2 sets - 10 reps - 10  sec  hold - Seated Single Arm Shoulder Abduction with Elbow Bent and Dumbbell  - 2 x daily - 7 x weekly - 1 sets - 3 reps - 30 sec  hold - Isometric Shoulder Abduction at Wall  - 2 x daily - 7 x weekly - 1 sets - 5-10 reps - 5 sec  hold - Isometric Shoulder Extension at Wall  - 2 x daily - 7 x weekly - 1 sets - 5-10 reps - 5 sec  hold - Standing Isometric Shoulder External Rotation with Doorway  - 2 x daily - 7 x weekly - 1 sets - 5-10 reps - 5 sec  hold - Isometric Shoulder Flexion at Wall  - 2 x daily - 7 x weekly - 1 sets - 5-10 reps - 5 sec  hold  ASSESSMENT:  CLINICAL IMPRESSION: Shoulder isometrics in all planes, working on R/L shoulders. Bent arm shoulder abd performed in pain-free range, focusing on deltoid activation; continued yellow TB for light resistance, added axial extension with yellow TB back of head. Use of rollator to work on AA shoulder flexion. Patient tolerated treatment well. Caregiver, Clide Dalton, observing exercise for home.   EVAL: Patient is an 83 y.o. female who was seen today for physical therapy evaluation and treatment for acute pain of R shoulder. Patient has a history of chronic R shoulder pain; cervical fusion with continued LBP; arthritis on cervical and lumbar spine; multiple joints. He has had bilat THA's and TKA's. Patient and husband report increase in R shoulder pain in the past 2 months. She presents with poor posture and alignment; decreased AROM; decreased strength; decreased functional activities; pain on a constant basis. Patient will benefit from PT to address problems identified.   OBJECTIVE IMPAIRMENTS: decreased ROM, decreased strength, increased fascial restrictions, impaired UE functional use, improper body mechanics, postural dysfunction, obesity, and pain.    GOALS: Goals reviewed with patient? Yes  SHORT TERM GOALS: Target date: 10/11/2023   Independent in initial HEP with husband's assistance  Baseline: Goal status: INITIAL  2.  Patient  reports increased functional use of R LE for ADLs including dressing and grooming with minimal to no increase in pain  Baseline:  Goal status: INITIAL  3.  Patient demonstrates palpable activation of middle and lower trap for elevation of R shoulder  Baseline:  Goal status: INITIAL   LONG TERM GOALS: Target date: 11/08/2023   Increase AROM R shoulder by 5-7 degrees  Baseline:  Goal status: INITIAL  2.  Increase strength R shoulder strength in flex; abd; ER by 1/2 to 1 muscle grade  Baseline:  Goal status: INITIAL  3.  Patient demonstrates ability to reach to top of head with R UE to facilitate grooming and self care such as combing hair  Baseline:  Goal status: INITIAL  4.  Independent in HEP  Baseline:  Goal status: INITIAL  5.  Improve Modified Quick Dash  Baseline: 59.1%; 59.1/100 Goal status: INITIAL  PLAN:  PT FREQUENCY: 2x/week  PT DURATION: 8 weeks  PLANNED INTERVENTIONS: 16109- PT  Re-evaluation, 97110-Therapeutic exercises, 97530- Therapeutic activity, 97112- Neuromuscular re-education, 97535- Self Care, 91478- Manual therapy, Patient/Family education, Taping, Dry Needling, Joint mobilization, Cryotherapy, and Moist heat  PLAN FOR NEXT SESSION: review and progress exercises; education in positioning for UE to promote circulation and improve comfort; manual work and modalities as indicated    Delorse Shane P Galileah Piggee, PT 09/27/2023, 1:14 PM

## 2023-09-29 ENCOUNTER — Ambulatory Visit

## 2023-09-29 DIAGNOSIS — G8929 Other chronic pain: Secondary | ICD-10-CM

## 2023-09-29 DIAGNOSIS — M25511 Pain in right shoulder: Secondary | ICD-10-CM | POA: Diagnosis not present

## 2023-09-29 DIAGNOSIS — R29898 Other symptoms and signs involving the musculoskeletal system: Secondary | ICD-10-CM

## 2023-09-29 NOTE — Therapy (Signed)
 OUTPATIENT PHYSICAL THERAPY SHOULDER TREATMENT   Patient Name: Joann Maddox MRN: [id number] DOB:[birth year], 83 y.o., female Today's Date: 09/29/2023  END OF SESSION:  PT End of Session - 09/29/23 1314     Visit Number 5    Number of Visits 16    Date for PT Re-Evaluation 11/08/23    Authorization Type [employer]; [employer] [place]    Progress Note Due on Visit 10    PT Start Time 1315    PT Stop Time 1400    PT Time Calculation (min) 45 min    Activity Tolerance Patient tolerated treatment well    Behavior During Therapy WFL for tasks assessed/performed            Past Medical History:  Diagnosis Date   Allergy    Anxiety    Arthritis    Cataract    Depression    GERD (gastroesophageal reflux disease)    Headache(784.0)    migrains   Heart murmur    Hypothyroidism    Osteoporosis 08/12/2023   DEXA 4/25: t score of    Pre-diabetes    Rheumatoid arthritis(714.0)    Past Surgical History:  Procedure Laterality Date   ABDOMINAL HYSTERECTOMY     ANTERIOR CERVICAL DECOMP/DISCECTOMY FUSION N/A 01/03/2018   Procedure: ANTERIOR CERVICAL DECOMPRESSION/DISCECTOMY FUSION, ALLOGRAFT, PLATE;  Surgeon: Adah Acron, MD;  Location: [facility] OR;  Service: Orthopedics;  Laterality: N/A;   APPENDECTOMY     CHOLECYSTECTOMY     JOINT REPLACEMENT     Both hips and both knees   REPLACEMENT TOTAL KNEE BILATERAL     SPINE SURGERY     TONSILLECTOMY     as  a child   TOTAL HIP ARTHROPLASTY     right and left   Patient Active Problem List   Diagnosis Date Noted   Osteoporosis 08/12/2023   Excessive sleepiness 04/01/2023   Unsteady gait 04/03/2022   Bilateral hearing loss 03/12/2022   Urinary incontinence 12/25/2021   Mild dementia (HCC) 06/24/2021   Coccydynia 12/23/2020   Gastroesophageal reflux disease with esophagitis without hemorrhage 04/05/2020   Iron deficiency anemia 06/22/2019   Lymphatic edema 12/06/2018   Sleep disturbance 12/05/2018   Venous stasis dermatitis of both lower  extremities 09/20/2018   Varicose veins of both lower extremities 08/22/2018   Depression, major, single episode, mild (HCC) 08/22/2018   Falls frequently 08/12/2018   Mass of thigh, right 05/17/2018   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   Status post cervical spinal fusion 01/11/2018   Chronic neck pain 10/20/2017   Chronic venous insufficiency 04/29/2017   IFG (impaired fasting glucose) 05/09/2014   Chronic constipation 02/19/2014   Rosacea 11/06/2013   GAD (generalized anxiety disorder) 06/08/2013   Essential hypertension, benign 05/04/2013   Migraine with aura 05/04/2013   Murmur, heart 05/04/2013   Hyperlipidemia 05/04/2013   Subclinical hyperthyroidism 05/04/2013   Fatigue 02/16/2012   Mixed stress and urge urinary incontinence 02/16/2012   Hyperglycemia 01/12/2012    PCP: Dr Cydney Draft  REFERRING PROVIDER: Dr Cydney Draft  REFERRING DIAG: R shoulder pain   THERAPY DIAG:  Acute pain of right shoulder  Chronic pain of both shoulders  Other symptoms and signs involving the musculoskeletal system  Rationale for Evaluation and Treatment: Rehabilitation  ONSET DATE: 07/10/23  SUBJECTIVE:  SUBJECTIVE STATEMENT: Patient reports her Rt shoulder is bothering her today more than Lt; caretaker present and said patient did a lot of walking around the hospital for her husband's appointment.   EVAL: Patient reports that she has had increased pain in the R shoulder which has increased in the past two months. She does not know of any injury or irritation. She has a history to R humeral fracture several years  Hand dominance: Right  PERTINENT HISTORY: multiple joint arthritis; HTN, varicose veins, CVI, venous stasis dermatitis, sleep disturbance, B THA, B TKA, OA, RA,  HYPOTHYROIDISM, GAD, Frequent Falls, Mild dementia, urinary incontinence, unsteady gait, B hearing loss LBP; DDD cervical spine: chronic R shoulder pain with proximal humeral fx; L shoulder tendonitis - scheduled for reverse shoulder replacement; bilat LE lymphedema   PAIN:  Are you having pain? Yes: NPRS scale: 3/10  Pain location: R shoulder; L shoulder   Pain description: dull ache Aggravating factors: using R arm  Relieving factors: not doing anything with R arm   PRECAUTIONS: Other: poor mobility; requires assist for ADL's including transfers   WEIGHT BEARING RESTRICTIONS: No  FALLS:  Has patient fallen in last 6 months? No  LIVING ENVIRONMENT: Lives with: lives with their spouse Lives in: House/apartment Stairs: No Has following equipment at home: Single point cane, Environmental consultant - 2 wheeled, Wheelchair (manual), Tour manager, and Grab bars  OCCUPATION: Retired; sedentary lifestyle  PLOF: Needs assistance with ADLs  PATIENT GOALS:get her shoulder better - so that it is not painful and she can use it better.   NEXT MD VISIT: 12/06/23  OBJECTIVE:  Note: Objective measures were completed at Evaluation unless otherwise noted.  DIAGNOSTIC FINDINGS:  Xray: 4/25: 1. Remote proximal humerus fracture, healed and unchanged in alignment from prior exam. 2. Moderate glenohumeral degenerative change. 3. Increased acromioclavicular spurring. Irregularity of the distal clavicle may represent sequela of remote injury, but new from 2020.  PATIENT SURVEYS:  Modified Quick Dash: 59.1%; 59.1/100 COGNITION: Overall cognitive status: memory deficits followed by [facility]     SENSATION: WFL  POSTURE: Head forward; shoulders rounded and elevated; head of the humerus anterior in orientation; scapulae abducted and rotated along the thoracic wall  UPPER EXTREMITY ROM: pain with AROM R shoulder motion   Active ROM Right eval Left eval  Shoulder flexion 108 99  Shoulder  extension 48 47  Shoulder abduction 120 85 in some abduction  Shoulder adduction    Shoulder internal rotation    Shoulder external rotation 38 27  Elbow flexion    Elbow extension    Wrist flexion    Wrist extension    Wrist ulnar deviation    Wrist radial deviation    Wrist pronation    Wrist supination    (Blank rows = not tested)  UPPER EXTREMITY MMT: strength assessed in available ranges   MMT Right eval Left eval  Shoulder flexion 3+ 3+  Shoulder extension 4- 4  Shoulder abduction 3+ 3+  Shoulder adduction    Shoulder internal rotation    Shoulder external rotation 3- 3  Middle trapezius    Lower trapezius    Elbow flexion    Elbow extension    Wrist flexion    Wrist extension    Wrist ulnar deviation    Wrist radial deviation    Wrist pronation    Wrist supination    Grip strength (lbs)    (Blank rows = not tested)  PALPATION:  Muscular tightness through the  ant/lat/posterior cervical musculature; pecs; upper trap; thoracic paraspinals      [facility] Adult PT Treatment:                                                DATE: 09/29/2023 Therapeutic Exercise: Seated with noodle: (Horiz) scap squeezes (Vert) thoracic extension Cervical AROM  Backward shoulder rolls  Backward shoulder rolls alternating sides  Scap MWM with pillowcase Passive shoulder abd with bent arm --> passive front arm circles Neuromuscular re-ed: Shoulder isometrics, assisted by therapist --> flexion, abd, ER  Therapeutic Activity: Wall table arm slides: flexion, scaption, abduction    [facility] Adult PT Treatment:                                                DATE: 09/27/2023 Therapeutic Exercise: Seated: Scap squeezes with pool noodle 10x5" Backward shoulder rolls  Backward shoulder rolls alternating sides  Lateral cervical flexion R/L with noodle x 5  Cervical rotation R/L with noodle x 5  Bent arm abd --> palm down & palm up with PT assist x 15 Manual Therapy: Sitting  --> R/L  shoulder PROM flexion & abduction --> scap stability with hand Therapeutic activities:   Shoulder abduction yellow TB around shoulders and chest bilat 2-3 sec x 15  Isometric abduction bilat UE's 3 sec x 10   Isometric extension bilat UE's 3 sec x 10  Isometric ER R/L UE 3 sec x 10   Isometric IR R/L UE 3 sec x 10   Shoulder flexion rolling rollator forward from sitting patient's hands on handles x 10   Pressing ball with both hands working on horizontal adduction   Prayer hands with forward elevation of shoulders to patient tolerance x 5  Neuromuscular re-ed: Working on posture and alignment focus on scap squeeze with noodle Backward shoulder rolls  Working on cervical posture and alignment  Chin tuck sitting with noodle along spine x 5  Chin tuck against coregeous ball 5 sec x 5 Chin tuck with yellow TB back of head PT providing resistance 5 sec x 5     [facility] Adult PT Treatment:                                                DATE: 09/22/2023 Therapeutic Exercise: Seated: Scap squeezes with pool noodle 10x5" Backward shoulder rolls  Chin tuck sitting with noodle along spine x 5  Lateral cervical flexion R/L with noodle x 5  Cervical rotation R/L with noodle x 5  Bent arm abd --> palm down & palm up with PT assist x 15 Manual Therapy: Sitting  --> R/L shoulder PROM flexion & abduction --> scap stability with hand Therapeutic activities:   Shoulder abduction yellow TB around shoulders and chest bilat 2-3 sec x 15  Isometric abduction bilat UE's 3 sec x 10   Isometric extension bilat UE's 3 sec x 10  Isometric ER R/L UE 3 sec x 10   Isometric IR R/L UE 3 sec x 10   Shoulder flexion rolling rollator forward from sitting patient's hands on handles x 10  Neuromuscular re-ed: Working on posture and alignment focus on scap squeeze with noodle Backward shoulder rolls                                                                                                                          PATIENT EDUCATION: Education details: POC; HEP  Person educated: Patient and Spouse Education method: Explanation, Demonstration, Actor cues, Verbal cues, and Handouts Education comprehension: verbalized understanding, returned demonstration, verbal cues required, tactile cues required, and needs further education  HOME EXERCISE PROGRAM: Access Code: [email or web address] URL: [email or web address] Date: 09/29/2023 Prepared by: Sims Duck  Exercises - Seated Scapular Retraction  - 2 x daily - 7 x weekly - 1-2 sets - 10 reps - 10 sec  hold - Seated Single Arm Shoulder Abduction with Elbow Bent and Dumbbell  - 2 x daily - 7 x weekly - 1 sets - 3 reps - 30 sec  hold - Isometric Shoulder Abduction at Wall  - 2 x daily - 7 x weekly - 1 sets - 5-10 reps - 5 sec  hold - Isometric Shoulder Extension at Wall  - 2 x daily - 7 x weekly - 1 sets - 5-10 reps - 5 sec  hold - Standing Isometric Shoulder External Rotation with Doorway  - 2 x daily - 7 x weekly - 1 sets - 5-10 reps - 5 sec  hold - Isometric Shoulder Flexion at Wall  - 2 x daily - 7 x weekly - 1 sets - 5-10 reps - 5 sec  hold - Seated Shoulder Flexion Slide at Table Top with Forearm in Neutral  - 1 x daily - 7 x weekly - 3 sets - 10 reps - Seated Shoulder Abduction Towel Slide at Table Top  - 1 x daily - 7 x weekly - 3 sets - 10 reps  ASSESSMENT:  CLINICAL IMPRESSION: Shoulder mobility exercises continued, incorporating arm slides at table in flexion, scaption, abduction planes. Good response with verbal cues during passive shoulder ROM. Patient encouraged to move shoulder more throughout the day; table slides added to HEP.  EVAL: Patient is an 83 y.o. female who was seen today for physical therapy evaluation and treatment for acute pain of R shoulder. Patient has a history of chronic R shoulder pain; cervical fusion with continued LBP; arthritis on cervical and lumbar spine; multiple joints. He has had bilat THA's and TKA's.  Patient and husband report increase in R shoulder pain in the past 2 months. She presents with poor posture and alignment; decreased AROM; decreased strength; decreased functional activities; pain on a constant basis. Patient will benefit from PT to address problems identified.   OBJECTIVE IMPAIRMENTS: decreased ROM, decreased strength, increased fascial restrictions, impaired UE functional use, improper body mechanics, postural dysfunction, obesity, and pain.    GOALS: Goals reviewed with patient? Yes  SHORT TERM GOALS: Target date: 10/11/2023  Independent in initial HEP with husband's assistance  Baseline: Goal status: INITIAL  2.  Patient reports increased  functional use of R LE for ADLs including dressing and grooming with minimal to no increase in pain  Baseline:  Goal status: INITIAL  3.  Patient demonstrates palpable activation of middle and lower trap for elevation of R shoulder  Baseline:  Goal status: INITIAL   LONG TERM GOALS: Target date: 11/08/2023  Increase AROM R shoulder by 5-7 degrees  Baseline:  Goal status: INITIAL  2.  Increase strength R shoulder strength in flex; abd; ER by 1/2 to 1 muscle grade  Baseline:  Goal status: INITIAL  3.  Patient demonstrates ability to reach to top of head with R UE to facilitate grooming and self care such as combing hair  Baseline:  Goal status: INITIAL  4.  Independent in HEP  Baseline:  Goal status: INITIAL  5.  Improve Modified Quick Dash  Baseline: 59.1%; 59.1/100 Goal status: INITIAL  PLAN:  PT FREQUENCY: 2x/week  PT DURATION: 8 weeks  PLANNED INTERVENTIONS: 97164- PT Re-evaluation, 97110-Therapeutic exercises, 97530- Therapeutic activity, 97112- Neuromuscular re-education, 97535- Self Care, 54098- Manual therapy, Patient/Family education, Taping, Dry Needling, Joint mobilization, Cryotherapy, and Moist heat  PLAN FOR NEXT SESSION: review and progress exercises; education in positioning for UE to promote  circulation and improve comfort; manual work and modalities as indicated    Flint Hummer, PTA 09/29/2023, 2:30 PM

## 2023-10-04 ENCOUNTER — Ambulatory Visit: Admitting: Rehabilitative and Restorative Service Providers"

## 2023-10-04 ENCOUNTER — Encounter: Payer: Self-pay | Admitting: Rehabilitative and Restorative Service Providers"

## 2023-10-04 DIAGNOSIS — M25511 Pain in right shoulder: Secondary | ICD-10-CM

## 2023-10-04 DIAGNOSIS — G8929 Other chronic pain: Secondary | ICD-10-CM

## 2023-10-04 DIAGNOSIS — R29898 Other symptoms and signs involving the musculoskeletal system: Secondary | ICD-10-CM

## 2023-10-04 DIAGNOSIS — M6281 Muscle weakness (generalized): Secondary | ICD-10-CM

## 2023-10-04 NOTE — Therapy (Signed)
 OUTPATIENT PHYSICAL THERAPY SHOULDER TREATMENT   Patient Name: Joann Maddox MRN: [id number] DOB:Oct 18, 1940, 83 y.o., female Today's Date: 10/04/2023  END OF SESSION:  PT End of Session - 10/04/23 1328     Visit Number 6    Number of Visits 16    Date for PT Re-Evaluation 11/08/23    Authorization Type blue [employer]; [employer] [place]    Progress Note Due on Visit 10    PT Start Time 1327   patient late for appt   PT Stop Time 1400    PT Time Calculation (min) 33 min    Activity Tolerance Patient tolerated treatment well            Past Medical History:  Diagnosis Date   Allergy    Anxiety    Arthritis    Cataract    Depression    GERD (gastroesophageal reflux disease)    Headache(784.0)    migrains   Heart murmur    Hypothyroidism    Osteoporosis 08/12/2023   DEXA 4/25: t score of    Pre-diabetes    Rheumatoid arthritis(714.0)    Past Surgical History:  Procedure Laterality Date   ABDOMINAL HYSTERECTOMY     ANTERIOR CERVICAL DECOMP/DISCECTOMY FUSION N/A 01/03/2018   Procedure: ANTERIOR CERVICAL DECOMPRESSION/DISCECTOMY FUSION, ALLOGRAFT, PLATE;  Surgeon: Adah Acron, MD;  Location: [facility] OR;  Service: Orthopedics;  Laterality: N/A;   APPENDECTOMY     CHOLECYSTECTOMY     JOINT REPLACEMENT     Both hips and both knees   REPLACEMENT TOTAL KNEE BILATERAL     SPINE SURGERY     TONSILLECTOMY     as  a child   TOTAL HIP ARTHROPLASTY     right and left   Patient Active Problem List   Diagnosis Date Noted   Osteoporosis 08/12/2023   Excessive sleepiness 04/01/2023   Unsteady gait 04/03/2022   Bilateral hearing loss 03/12/2022   Urinary incontinence 12/25/2021   Mild dementia (HCC) 06/24/2021   Coccydynia 12/23/2020   Gastroesophageal reflux disease with esophagitis without hemorrhage 04/05/2020   Iron deficiency anemia 06/22/2019   Lymphatic edema 12/06/2018   Sleep disturbance 12/05/2018   Venous stasis dermatitis of both lower extremities 09/20/2018   Varicose  veins of both lower extremities 08/22/2018   Depression, major, single episode, mild (HCC) 08/22/2018   Falls frequently 08/12/2018   Mass of thigh, right 05/17/2018   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   Status post cervical spinal fusion 01/11/2018   Chronic neck pain 10/20/2017   Chronic venous insufficiency 04/29/2017   IFG (impaired fasting glucose) 05/09/2014   Chronic constipation 02/19/2014   Rosacea 11/06/2013   GAD (generalized anxiety disorder) 06/08/2013   Essential hypertension, benign 05/04/2013   Migraine with aura 05/04/2013   Murmur, heart 05/04/2013   Hyperlipidemia 05/04/2013   Subclinical hyperthyroidism 05/04/2013   Fatigue 02/16/2012   Mixed stress and urge urinary incontinence 02/16/2012   Hyperglycemia 01/12/2012    PCP: Dr Cydney Draft  REFERRING PROVIDER: Dr Cydney Draft  REFERRING DIAG: R shoulder pain   THERAPY DIAG:  Acute pain of right shoulder  Chronic pain of both shoulders  Other symptoms and signs involving the musculoskeletal system  Muscle weakness (generalized)  Rationale for Evaluation and Treatment: Rehabilitation  ONSET DATE: 07/10/23  SUBJECTIVE:  SUBJECTIVE STATEMENT: Patient's husband reports that Joann Maddox fell Friday evening and bumped her head. Her husband was able to get her out o the floor with the help of a lift he has at home. Joann Maddox reports her L shoulder seems to be bothering her today more than R today.  EVAL: Patient reports that she has had increased pain in the R shoulder which has increased in the past two months. She does not know of any injury or irritation. She has a history to R humeral fracture several years  Hand dominance: Right  PERTINENT HISTORY: multiple joint arthritis; HTN, varicose veins, CVI,  venous stasis dermatitis, sleep disturbance, B THA, B TKA, OA, RA, HYPOTHYROIDISM, GAD, Frequent Falls, Mild dementia, urinary incontinence, unsteady gait, B hearing loss LBP; DDD cervical spine: chronic R shoulder pain with proximal humeral fx; L shoulder tendonitis - scheduled for reverse shoulder replacement; bilat LE lymphedema   PAIN:  Are you having pain? Yes: NPRS scale: 5/10  Pain location: R shoulder; L shoulder   Pain description: dull ache Aggravating factors: using R arm  Relieving factors: not doing anything with R arm   PRECAUTIONS: Other: poor mobility; requires assist for ADL's including transfers   WEIGHT BEARING RESTRICTIONS: No  FALLS:  Has patient fallen in last 6 months? No  LIVING ENVIRONMENT: Lives with: lives with their spouse Lives in: House/apartment Stairs: No Has following equipment at home: Single point cane, Environmental consultant - 2 wheeled, Wheelchair (manual), Tour manager, and Grab bars  OCCUPATION: Retired; sedentary lifestyle  PLOF: Needs assistance with ADLs  PATIENT GOALS:get her shoulder better - so that it is not painful and she can use it better.   NEXT MD VISIT: 12/06/23  OBJECTIVE:  Note: Objective measures were completed at Evaluation unless otherwise noted.  DIAGNOSTIC FINDINGS:  Xray: 4/25: 1. Remote proximal humerus fracture, healed and unchanged in alignment from prior exam. 2. Moderate glenohumeral degenerative change. 3. Increased acromioclavicular spurring. Irregularity of the distal clavicle may represent sequela of remote injury, but new from 2020.  PATIENT SURVEYS:  Modified Quick Dash: 59.1%; 59.1/100 COGNITION: Overall cognitive status: memory deficits followed by [facility]     SENSATION: WFL  POSTURE: Head forward; shoulders rounded and elevated; head of the humerus anterior in orientation; scapulae abducted and rotated along the thoracic wall  UPPER EXTREMITY ROM: pain with AROM R shoulder motion   Active ROM  Right eval Left eval  Shoulder flexion 108 99  Shoulder extension 48 47  Shoulder abduction 120 85 in some abduction  Shoulder adduction    Shoulder internal rotation    Shoulder external rotation 38 27  Elbow flexion    Elbow extension    Wrist flexion    Wrist extension    Wrist ulnar deviation    Wrist radial deviation    Wrist pronation    Wrist supination    (Blank rows = not tested)  UPPER EXTREMITY MMT: strength assessed in available ranges   MMT Right eval Left eval  Shoulder flexion 3+ 3+  Shoulder extension 4- 4  Shoulder abduction 3+ 3+  Shoulder adduction    Shoulder internal rotation    Shoulder external rotation 3- 3  Middle trapezius    Lower trapezius    Elbow flexion    Elbow extension    Wrist flexion    Wrist extension    Wrist ulnar deviation    Wrist radial deviation    Wrist pronation    Wrist supination  Grip strength (lbs)    (Blank rows = not tested)  PALPATION:  Muscular tightness through the ant/lat/posterior cervical musculature; pecs; upper trap; thoracic paraspinals      [facility] Adult PT Treatment:                                                DATE: 10/04/2023 Therapeutic Exercise: Seated: Scap squeezes with pool noodle 10x5" Backward shoulder rolls  Backward shoulder rolls alternating sides  Lateral cervical flexion R/L with noodle x 5  Cervical rotation R/L with noodle x 5  Bent arm abd PT assist x 15 R/L  Manual Therapy: STM bilat shoulders  Assisted thoracic extension on coregeous ball  Sitting  --> R/L shoulder PROM flexion & abduction --> scap stability  Therapeutic activities:   Shoulder flexion and extension crossed arms with resistance by PT  Isometric abduction bilat UE's 3 sec x 10   Isometric extension bilat UE's 3 sec x 10  Shoulder flexion rolling foam roll along thigh  Rollator forward from sitting patient's hands on handles x 10   Pressing ball with both hands working on horizontal adduction   Prayer hands  clasp with forward elevation of shoulders to patient tolerance x 5   Touching opposite shoulder R/L x 10  Rubbing one hand wrist to shoulder of opposite arm R/L x 10  Neuromuscular re-ed: Working on posture and alignment focus on scap squeeze with noodle Backward shoulder rolls  Working on cervical posture and alignment  Chin tuck sitting with noodle along spine x 5  Chin tuck against coregeous ball 5 sec x 5 Chin tuck with yellow TB back of head PT providing resistance 5 sec x 5  [facility] Adult PT Treatment:                                                DATE: 09/29/2023 Therapeutic Exercise: Seated with noodle: (Horiz) scap squeezes (Vert) thoracic extension Cervical AROM  Backward shoulder rolls  Backward shoulder rolls alternating sides  Scap MWM with pillowcase Passive shoulder abd with bent arm --> passive front arm circles Neuromuscular re-ed: Shoulder isometrics, assisted by therapist --> flexion, abd, ER  Therapeutic Activity: Wall table arm slides: flexion, scaption, abduction    [facility] Adult PT Treatment:                                                DATE: 09/27/2023 Therapeutic Exercise: Seated: Scap squeezes with pool noodle 10x5" Backward shoulder rolls  Backward shoulder rolls alternating sides  Lateral cervical flexion R/L with noodle x 5  Cervical rotation R/L with noodle x 5  Bent arm abd --> palm down & palm up with PT assist x 15 Manual Therapy: Sitting  --> R/L shoulder PROM flexion & abduction --> scap stability with hand Therapeutic activities:   Shoulder abduction yellow TB around shoulders and chest bilat 2-3 sec x 15  Isometric abduction bilat UE's 3 sec x 10   Isometric extension bilat UE's 3 sec x 10  Isometric ER R/L UE 3 sec x 10   Isometric IR R/L  UE 3 sec x 10   Shoulder flexion rolling rollator forward from sitting patient's hands on handles x 10   Pressing ball with both hands working on horizontal adduction   Prayer hands with forward elevation  of shoulders to patient tolerance x 5  Neuromuscular re-ed: Working on posture and alignment focus on scap squeeze with noodle Backward shoulder rolls  Working on cervical posture and alignment  Chin tuck sitting with noodle along spine x 5  Chin tuck against coregeous ball 5 sec x 5 Chin tuck with yellow TB back of head PT providing resistance 5 sec x 5     [facility] Adult PT Treatment:                                                DATE: 09/22/2023 Therapeutic Exercise: Seated: Scap squeezes with pool noodle 10x5" Backward shoulder rolls  Chin tuck sitting with noodle along spine x 5  Lateral cervical flexion R/L with noodle x 5  Cervical rotation R/L with noodle x 5  Bent arm abd --> palm down & palm up with PT assist x 15 Manual Therapy: Sitting  --> R/L shoulder PROM flexion & abduction --> scap stability with hand Therapeutic activities:   Shoulder abduction yellow TB around shoulders and chest bilat 2-3 sec x 15  Isometric abduction bilat UE's 3 sec x 10   Isometric extension bilat UE's 3 sec x 10  Isometric ER R/L UE 3 sec x 10   Isometric IR R/L UE 3 sec x 10   Shoulder flexion rolling rollator forward from sitting patient's hands on handles x 10  Neuromuscular re-ed: Working on posture and alignment focus on scap squeeze with noodle Backward shoulder rolls                                                                                                                         PATIENT EDUCATION: Education details: POC; HEP  Person educated: Patient and Spouse Education method: Explanation, Demonstration, Actor cues, Verbal cues, and Handouts Education comprehension: verbalized understanding, returned demonstration, verbal cues required, tactile cues required, and needs further education  HOME EXERCISE PROGRAM: Access Code: [email or web address] URL: [email or web address] Date: 09/29/2023 Prepared by: Sims Duck  Exercises - Seated Scapular Retraction  - 2 x daily  - 7 x weekly - 1-2 sets - 10 reps - 10 sec  hold - Seated Single Arm Shoulder Abduction with Elbow Bent and Dumbbell  - 2 x daily - 7 x weekly - 1 sets - 3 reps - 30 sec  hold - Isometric Shoulder Abduction at Wall  - 2 x daily - 7 x weekly - 1 sets - 5-10 reps - 5 sec  hold - Isometric Shoulder Extension at Wall  - 2 x daily - 7 x weekly - 1 sets - 5-10 reps - 5 sec  hold - Standing Isometric Shoulder External Rotation with Doorway  - 2 x daily - 7 x weekly - 1 sets - 5-10 reps - 5 sec  hold - Isometric Shoulder Flexion at Wall  - 2 x daily - 7 x weekly - 1 sets - 5-10 reps - 5 sec  hold - Seated Shoulder Flexion Slide at Table Top with Forearm in Neutral  - 1 x daily - 7 x weekly - 3 sets - 10 reps - Seated Shoulder Abduction Towel Slide at Table Top  - 1 x daily - 7 x weekly - 3 sets - 10 reps  ASSESSMENT:  CLINICAL IMPRESSION: Shoulder mobility exercises continued, incorporating functional activities to exercises. Tolerated all exercises well with good response to resistive push/pull and ROM exercises added today. Patient encouraged to use UE's to pet her dog at home.   EVAL: Patient is an 83 y.o. female who was seen today for physical therapy evaluation and treatment for acute pain of R shoulder. Patient has a history of chronic R shoulder pain; cervical fusion with continued LBP; arthritis on cervical and lumbar spine; multiple joints. He has had bilat THA's and TKA's. Patient and husband report increase in R shoulder pain in the past 2 months. She presents with poor posture and alignment; decreased AROM; decreased strength; decreased functional activities; pain on a constant basis. Patient will benefit from PT to address problems identified.   OBJECTIVE IMPAIRMENTS: decreased ROM, decreased strength, increased fascial restrictions, impaired UE functional use, improper body mechanics, postural dysfunction, obesity, and pain.    GOALS: Goals reviewed with patient? Yes  SHORT TERM GOALS:  Target date: 10/11/2023  Independent in initial HEP with husband's assistance  Baseline: Goal status: INITIAL  2.  Patient reports increased functional use of R LE for ADLs including dressing and grooming with minimal to no increase in pain  Baseline:  Goal status: INITIAL  3.  Patient demonstrates palpable activation of middle and lower trap for elevation of R shoulder  Baseline:  Goal status: INITIAL   LONG TERM GOALS: Target date: 11/08/2023  Increase AROM R shoulder by 5-7 degrees  Baseline:  Goal status: INITIAL  2.  Increase strength R shoulder strength in flex; abd; ER by 1/2 to 1 muscle grade  Baseline:  Goal status: INITIAL  3.  Patient demonstrates ability to reach to top of head with R UE to facilitate grooming and self care such as combing hair  Baseline:  Goal status: INITIAL  4.  Independent in HEP  Baseline:  Goal status: INITIAL  5.  Improve Modified Quick Dash  Baseline: 59.1%; 59.1/100 Goal status: INITIAL  PLAN:  PT FREQUENCY: 2x/week  PT DURATION: 8 weeks  PLANNED INTERVENTIONS: 97164- PT Re-evaluation, 97110-Therapeutic exercises, 97530- Therapeutic activity, 97112- Neuromuscular re-education, 97535- Self Care, [id number]- Manual therapy, Patient/Family education, Taping, Dry Needling, Joint mobilization, Cryotherapy, and Moist heat  PLAN FOR NEXT SESSION: review and progress exercises; education in positioning for UE to promote circulation and improve comfort; manual work and modalities as indicated    Quinnten Calvin P Carlas Vandyne, PT 10/04/2023, 1:30 PM

## 2023-10-06 ENCOUNTER — Encounter: Payer: Self-pay | Admitting: Rehabilitative and Restorative Service Providers"

## 2023-10-06 ENCOUNTER — Ambulatory Visit: Admitting: Rehabilitative and Restorative Service Providers"

## 2023-10-06 DIAGNOSIS — M25511 Pain in right shoulder: Secondary | ICD-10-CM

## 2023-10-06 DIAGNOSIS — G8929 Other chronic pain: Secondary | ICD-10-CM

## 2023-10-06 DIAGNOSIS — M6281 Muscle weakness (generalized): Secondary | ICD-10-CM

## 2023-10-06 DIAGNOSIS — R29898 Other symptoms and signs involving the musculoskeletal system: Secondary | ICD-10-CM

## 2023-10-06 NOTE — Therapy (Addendum)
 OUTPATIENT PHYSICAL THERAPY SHOULDER TREATMENT PHYSICAL THERAPY DISCHARGE SUMMARY  Visits from Start of Care: 7  Current functional level related to goals / functional outcomes: See progress note for discharge status    Remaining deficits: Continued pain bilat shoulder    Education / Equipment: HEP   Patient agrees to discharge. Patient goals were partially met. Patient is being discharged due to not returning since the last visit. Varonica Siharath P. Ina PT, MPH 01/05/24 8:25 AM     Patient Name: Joann Maddox MRN: [id number] DOB:02-07-1941, 83 y.o., female Today's Date: 10/06/2023  END OF SESSION:  PT End of Session - 10/06/23 1404     Visit Number 7    Number of Visits 16    Date for PT Re-Evaluation 11/08/23    Authorization Type blue medicare; [employer] [place]    Progress Note Due on Visit 10    PT Start Time 1404    PT Stop Time 1445    PT Time Calculation (min) 41 min    Activity Tolerance Patient tolerated treatment well            Past Medical History:  Diagnosis Date   Allergy    Anxiety    Arthritis    Cataract    Depression    GERD (gastroesophageal reflux disease)    Headache(784.0)    migrains   Heart murmur    Hypothyroidism    Osteoporosis 08/12/2023   DEXA 4/25: t score of    Pre-diabetes    Rheumatoid arthritis(714.0)    Past Surgical History:  Procedure Laterality Date   ABDOMINAL HYSTERECTOMY     ANTERIOR CERVICAL DECOMP/DISCECTOMY FUSION N/A 01/03/2018   Procedure: ANTERIOR CERVICAL DECOMPRESSION/DISCECTOMY FUSION, ALLOGRAFT, PLATE;  Surgeon: Barbarann Oneil BROCKS, MD;  Location: [facility] OR;  Service: Orthopedics;  Laterality: N/A;   APPENDECTOMY     CHOLECYSTECTOMY     JOINT REPLACEMENT     Both hips and both knees   REPLACEMENT TOTAL KNEE BILATERAL     SPINE SURGERY     TONSILLECTOMY     as  a child   TOTAL HIP ARTHROPLASTY     right and left   Patient Active Problem List   Diagnosis Date Noted   Osteoporosis 08/12/2023   Excessive sleepiness  04/01/2023   Unsteady gait 04/03/2022   Bilateral hearing loss 03/12/2022   Urinary incontinence 12/25/2021   Mild dementia (HCC) 06/24/2021   Coccydynia 12/23/2020   Gastroesophageal reflux disease with esophagitis without hemorrhage 04/05/2020   Iron deficiency anemia 06/22/2019   Lymphatic edema 12/06/2018   Sleep disturbance 12/05/2018   Venous stasis dermatitis of both lower extremities 09/20/2018   Varicose veins of both lower extremities 08/22/2018   Depression, major, single episode, mild (HCC) 08/22/2018   Falls frequently 08/12/2018   Mass of thigh, right 05/17/2018   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   Status post cervical spinal fusion 01/11/2018   Chronic neck pain 10/20/2017   Chronic venous insufficiency 04/29/2017   IFG (impaired fasting glucose) 05/09/2014   Chronic constipation 02/19/2014   Rosacea 11/06/2013   GAD (generalized anxiety disorder) 06/08/2013   Essential hypertension, benign 05/04/2013   Migraine with aura 05/04/2013   Murmur, heart 05/04/2013   Hyperlipidemia 05/04/2013   Subclinical hyperthyroidism 05/04/2013   Fatigue 02/16/2012   Mixed stress and urge urinary incontinence 02/16/2012   Hyperglycemia 01/12/2012    PCP: Dr Dorothyann JONETTA Byars  REFERRING PROVIDER: Dr Dorothyann JONETTA Byars  REFERRING DIAG: R shoulder pain  THERAPY DIAG:  Acute pain of right shoulder  Chronic pain of both shoulders  Other symptoms and signs involving the musculoskeletal system  Muscle weakness (generalized)  Rationale for Evaluation and Treatment: Rehabilitation  ONSET DATE: 07/10/23  SUBJECTIVE:                                                                                                                                                                                      SUBJECTIVE STATEMENT: Patient reports that her R shoulder is worse than the L. Patient is working on the exercises at home.    EVAL: Patient reports that she  has had increased pain in the R shoulder which has increased in the past two months. She does not know of any injury or irritation. She has a history to R humeral fracture several years  Hand dominance: Right  PERTINENT HISTORY: multiple joint arthritis; HTN, varicose veins, CVI, venous stasis dermatitis, sleep disturbance, B THA, B TKA, OA, RA, HYPOTHYROIDISM, GAD, Frequent Falls, Mild dementia, urinary incontinence, unsteady gait, B hearing loss LBP; DDD cervical spine: chronic R shoulder pain with proximal humeral fx; L shoulder tendonitis - scheduled for reverse shoulder replacement; bilat LE lymphedema   PAIN:  Are you having pain? Yes: NPRS scale: 7/10  Pain location: R shoulder; L shoulder   Pain description: dull ache Aggravating factors: using R arm  Relieving factors: not doing anything with R arm   PRECAUTIONS: Other: poor mobility; requires assist for ADL's including transfers   WEIGHT BEARING RESTRICTIONS: No  FALLS:  Has patient fallen in last 6 months? No  LIVING ENVIRONMENT: Lives with: lives with their spouse Lives in: House/apartment Stairs: No Has following equipment at home: Single point cane, Environmental consultant - 2 wheeled, Wheelchair (manual), Tour manager, and Grab bars  OCCUPATION: Retired; sedentary lifestyle  PLOF: Needs assistance with ADLs  PATIENT GOALS:get her shoulder better - so that it is not painful and she can use it better.   NEXT MD VISIT: 12/06/23  OBJECTIVE:  Note: Objective measures were completed at Evaluation unless otherwise noted.  DIAGNOSTIC FINDINGS:  Xray: 4/25: 1. Remote proximal humerus fracture, healed and unchanged in alignment from prior exam. 2. Moderate glenohumeral degenerative change. 3. Increased acromioclavicular spurring. Irregularity of the distal clavicle may represent sequela of remote injury, but new from 2020.  PATIENT SURVEYS:  Modified Quick Dash: 59.1%; 59.1/100 COGNITION: Overall cognitive status: memory deficits  followed by [facility]     SENSATION: WFL  POSTURE: Head forward; shoulders rounded and elevated; head of the humerus anterior in orientation; scapulae abducted and rotated along the thoracic wall  UPPER EXTREMITY ROM: pain with AROM R  shoulder motion   Active ROM Right eval Left eval  Shoulder flexion 108 99  Shoulder extension 48 47  Shoulder abduction 120 85 in some abduction  Shoulder adduction    Shoulder internal rotation    Shoulder external rotation 38 27  Elbow flexion    Elbow extension    Wrist flexion    Wrist extension    Wrist ulnar deviation    Wrist radial deviation    Wrist pronation    Wrist supination    (Blank rows = not tested)  UPPER EXTREMITY MMT: strength assessed in available ranges   MMT Right eval Left eval  Shoulder flexion 3+ 3+  Shoulder extension 4- 4  Shoulder abduction 3+ 3+  Shoulder adduction    Shoulder internal rotation    Shoulder external rotation 3- 3  Middle trapezius    Lower trapezius    Elbow flexion    Elbow extension    Wrist flexion    Wrist extension    Wrist ulnar deviation    Wrist radial deviation    Wrist pronation    Wrist supination    Grip strength (lbs)    (Blank rows = not tested)  PALPATION:  Muscular tightness through the ant/lat/posterior cervical musculature; pecs; upper trap; thoracic paraspinals      [facility] Adult PT Treatment:                                                DATE: 10/06/2023 Therapeutic Exercise: Seated: Scap squeezes with pool noodle 10x5 Backward shoulder rolls  Backward shoulder rolls alternating sides  Lateral cervical flexion R/L with noodle x 5  Cervical rotation R/L with noodle x 5  Bent arm abd PT assist x 15 R/L  Manual Therapy: STM bilat shoulders  Assisted thoracic extension on coregeous ball  Sitting  --> R/L shoulder PROM flexion & abduction  Therapeutic activities:   Shoulder flexion and extension crossed arms with resistance by PT  Isometric  abduction bilat UE's 3 sec x 10   Isometric extension bilat UE's 3 sec x 10  Shoulder flexion rolling foam roll along thigh  Rollator forward from sitting patient's hands on handles x 10   Pressing ball with both hands - elbow flexion and extension, chest press, overhead press, wood chopping,    Prayer hands clasp pressing hands together 5 sec x 10  Prayer hands clasp with forward elevation of shoulders to patient tolerance x 10   Touching opposite shoulder R/L x 10  Rubbing one hand wrist to shoulder of opposite arm R/L x 10  Pull back with red TB shoulder extension bilat x 10   Shoulder adduction red TB PT providing resistance x 10  Neuromuscular re-ed: Working on posture and alignment focus on scap squeeze with noodle Backward shoulder rolls  Working on cervical posture and alignment  Chin tuck sitting with noodle along spine x 5  Chin tuck against coregeous ball 5 sec x 5 Chin tuck with yellow TB back of head PT providing resistance 5 sec x 5   [facility] Adult PT Treatment:                                                DATE:  10/04/2023 Therapeutic Exercise: Seated: Scap squeezes with pool noodle 10x5 Backward shoulder rolls  Backward shoulder rolls alternating sides  Lateral cervical flexion R/L with noodle x 5  Cervical rotation R/L with noodle x 5  Bent arm abd PT assist x 15 R/L  Manual Therapy: STM bilat shoulders  Assisted thoracic extension on coregeous ball  Sitting  --> R/L shoulder PROM flexion & abduction --> scap stability  Therapeutic activities:   Shoulder flexion and extension crossed arms with resistance by PT  Isometric abduction bilat UE's 3 sec x 10   Isometric extension bilat UE's 3 sec x 10  Shoulder flexion rolling foam roll along thigh  Rollator forward from sitting patient's hands on handles x 10   Pressing ball with both hands working on horizontal adduction   Prayer hands clasp with forward elevation of shoulders to patient tolerance x 5   Touching  opposite shoulder R/L x 10  Rubbing one hand wrist to shoulder of opposite arm R/L x 10  Neuromuscular re-ed: Working on posture and alignment focus on scap squeeze with noodle Backward shoulder rolls  Working on cervical posture and alignment  Chin tuck sitting with noodle along spine x 5  Chin tuck against coregeous ball 5 sec x 5 Chin tuck with yellow TB back of head PT providing resistance 5 sec x 5  [facility] Adult PT Treatment:                                                DATE: 09/29/2023 Therapeutic Exercise: Seated with noodle: (Horiz) scap squeezes (Vert) thoracic extension Cervical AROM  Backward shoulder rolls  Backward shoulder rolls alternating sides  Scap MWM with pillowcase Passive shoulder abd with bent arm --> passive front arm circles Neuromuscular re-ed: Shoulder isometrics, assisted by therapist --> flexion, abd, ER  Therapeutic Activity: Wall table arm slides: flexion, scaption, abduction                                                                                                        PATIENT EDUCATION: Education details: POC; HEP  Person educated: Patient and Spouse Education method: Explanation, Demonstration, Actor cues, Verbal cues, and Handouts Education comprehension: verbalized understanding, returned demonstration, verbal cues required, tactile cues required, and needs further education  HOME EXERCISE PROGRAM: Access Code: [email or web address] URL: [email or web address] Date: 09/29/2023 Prepared by: Lamarr Price  Exercises - Seated Scapular Retraction  - 2 x daily - 7 x weekly - 1-2 sets - 10 reps - 10 sec  hold - Seated Single Arm Shoulder Abduction with Elbow Bent and Dumbbell  - 2 x daily - 7 x weekly - 1 sets - 3 reps - 30 sec  hold - Isometric Shoulder Abduction at Wall  - 2 x daily - 7 x weekly - 1 sets - 5-10 reps - 5 sec  hold - Isometric Shoulder Extension at Wall  - 2 x daily - 7 x weekly -  1 sets - 5-10 reps - 5 sec  hold -  Standing Isometric Shoulder External Rotation with Doorway  - 2 x daily - 7 x weekly - 1 sets - 5-10 reps - 5 sec  hold - Isometric Shoulder Flexion at Wall  - 2 x daily - 7 x weekly - 1 sets - 5-10 reps - 5 sec  hold - Seated Shoulder Flexion Slide at Table Top with Forearm in Neutral  - 1 x daily - 7 x weekly - 3 sets - 10 reps - Seated Shoulder Abduction Towel Slide at Table Top  - 1 x daily - 7 x weekly - 3 sets - 10 reps  ASSESSMENT:  CLINICAL IMPRESSION: Shoulder mobility exercises continued, incorporating functional activities with exercises. Tolerated all exercises well with good response to resistive push/pull and ROM exercises. Note increased functional elevation of shoulders with hands clasp.   EVAL: Patient is an 83 y.o. female who was seen today for physical therapy evaluation and treatment for acute pain of R shoulder. Patient has a history of chronic R shoulder pain; cervical fusion with continued LBP; arthritis on cervical and lumbar spine; multiple joints. He has had bilat THA's and TKA's. Patient and husband report increase in R shoulder pain in the past 2 months. She presents with poor posture and alignment; decreased AROM; decreased strength; decreased functional activities; pain on a constant basis. Patient will benefit from PT to address problems identified.   OBJECTIVE IMPAIRMENTS: decreased ROM, decreased strength, increased fascial restrictions, impaired UE functional use, improper body mechanics, postural dysfunction, obesity, and pain.    GOALS: Goals reviewed with patient? Yes  SHORT TERM GOALS: Target date: 10/11/2023  Independent in initial HEP with husband's assistance  Baseline: Goal status: INITIAL  2.  Patient reports increased functional use of R LE for ADLs including dressing and grooming with minimal to no increase in pain  Baseline:  Goal status: INITIAL  3.  Patient demonstrates palpable activation of middle and lower trap for elevation of R shoulder   Baseline:  Goal status: INITIAL   LONG TERM GOALS: Target date: 11/08/2023  Increase AROM R shoulder by 5-7 degrees  Baseline:  Goal status: INITIAL  2.  Increase strength R shoulder strength in flex; abd; ER by 1/2 to 1 muscle grade  Baseline:  Goal status: INITIAL  3.  Patient demonstrates ability to reach to top of head with R UE to facilitate grooming and self care such as combing hair  Baseline:  Goal status: INITIAL  4.  Independent in HEP  Baseline:  Goal status: INITIAL  5.  Improve Modified Quick Dash  Baseline: 59.1%; 59.1/100 Goal status: INITIAL  PLAN:  PT FREQUENCY: 2x/week  PT DURATION: 8 weeks  PLANNED INTERVENTIONS: 97164- PT Re-evaluation, 97110-Therapeutic exercises, 97530- Therapeutic activity, 97112- Neuromuscular re-education, 97535- Self Care, [id number]- Manual therapy, Patient/Family education, Taping, Dry Needling, Joint mobilization, Cryotherapy, and Moist heat  PLAN FOR NEXT SESSION: review and progress exercises; education in positioning for UE to promote circulation and improve comfort; manual work and modalities as indicated    Julus Kelley P Nyema Hachey, PT 10/06/2023, 2:04 PM

## 2023-10-14 ENCOUNTER — Other Ambulatory Visit: Payer: Self-pay | Admitting: Family Medicine

## 2023-10-19 ENCOUNTER — Ambulatory Visit: Admitting: Rehabilitative and Restorative Service Providers"

## 2023-10-21 ENCOUNTER — Other Ambulatory Visit: Payer: Self-pay | Admitting: Family Medicine

## 2023-10-21 ENCOUNTER — Ambulatory Visit: Admitting: Rehabilitative and Restorative Service Providers"

## 2023-10-25 ENCOUNTER — Ambulatory Visit: Admitting: Rehabilitative and Restorative Service Providers"

## 2023-10-27 ENCOUNTER — Ambulatory Visit: Attending: Family Medicine | Admitting: Rehabilitative and Restorative Service Providers"

## 2023-10-27 DIAGNOSIS — M6281 Muscle weakness (generalized): Secondary | ICD-10-CM | POA: Insufficient documentation

## 2023-10-27 DIAGNOSIS — M25511 Pain in right shoulder: Secondary | ICD-10-CM | POA: Insufficient documentation

## 2023-10-27 DIAGNOSIS — R29898 Other symptoms and signs involving the musculoskeletal system: Secondary | ICD-10-CM | POA: Insufficient documentation

## 2023-10-27 DIAGNOSIS — I890 Lymphedema, not elsewhere classified: Secondary | ICD-10-CM | POA: Insufficient documentation

## 2023-10-27 DIAGNOSIS — G8929 Other chronic pain: Secondary | ICD-10-CM | POA: Insufficient documentation

## 2023-10-27 DIAGNOSIS — M25512 Pain in left shoulder: Secondary | ICD-10-CM | POA: Insufficient documentation

## 2023-10-31 ENCOUNTER — Other Ambulatory Visit: Payer: Self-pay | Admitting: Family Medicine

## 2023-11-01 ENCOUNTER — Ambulatory Visit: Admitting: Rehabilitative and Restorative Service Providers"

## 2023-11-03 ENCOUNTER — Encounter: Admitting: Rehabilitative and Restorative Service Providers"

## 2023-11-15 ENCOUNTER — Other Ambulatory Visit: Payer: Self-pay | Admitting: Family Medicine

## 2023-11-15 ENCOUNTER — Encounter: Payer: Self-pay | Admitting: Family Medicine

## 2023-11-15 DIAGNOSIS — F339 Major depressive disorder, recurrent, unspecified: Secondary | ICD-10-CM

## 2023-11-15 DIAGNOSIS — R4189 Other symptoms and signs involving cognitive functions and awareness: Secondary | ICD-10-CM

## 2023-12-01 MED ORDER — IBANDRONATE SODIUM 150 MG PO TABS: 150.0000 mg | ORAL_TABLET | ORAL | 3 refills | Status: AC

## 2023-12-06 ENCOUNTER — Ambulatory Visit: Admitting: Family Medicine

## 2023-12-08 ENCOUNTER — Telehealth: Payer: Self-pay | Admitting: Family Medicine

## 2023-12-08 NOTE — Telephone Encounter (Signed)
 Please call her husband:  Good Morning,  I wanted to update you on this referral.  Unfortunately, we believe patient needs a higher level of care. It has been recommended to see a Geriatric Psych. Please refer out.   You can try:  [facility]- [phone]  [facility] [phone]  [facility] [phone]  [facility] [phone]    See if they are ok with one of these options.

## 2023-12-08 NOTE — Telephone Encounter (Signed)
 Spoke with patient husband Christopher-  given the four options along with their phone numbers. He will research these and he will return our call with  his choice.  Gave the patient my direct contact number as well as general office contact information.

## 2023-12-11 ENCOUNTER — Other Ambulatory Visit: Payer: Self-pay | Admitting: Family Medicine

## 2023-12-11 DIAGNOSIS — E785 Hyperlipidemia, unspecified: Secondary | ICD-10-CM

## 2023-12-12 ENCOUNTER — Other Ambulatory Visit: Payer: Self-pay | Admitting: Family Medicine

## 2023-12-12 DIAGNOSIS — R600 Localized edema: Secondary | ICD-10-CM

## 2023-12-14 ENCOUNTER — Ambulatory Visit (INDEPENDENT_AMBULATORY_CARE_PROVIDER_SITE_OTHER)

## 2023-12-14 ENCOUNTER — Encounter: Payer: Self-pay | Admitting: Family Medicine

## 2023-12-14 ENCOUNTER — Ambulatory Visit
Admission: EM | Admit: 2023-12-14 | Discharge: 2023-12-14 | Disposition: A | Discharge status: Home / Self Care | Attending: Family Medicine | Admitting: Family Medicine

## 2023-12-14 DIAGNOSIS — R059 Cough, unspecified: Secondary | ICD-10-CM | POA: Diagnosis not present

## 2023-12-14 DIAGNOSIS — J069 Acute upper respiratory infection, unspecified: Secondary | ICD-10-CM

## 2023-12-14 MED ORDER — DOXYCYCLINE HYCLATE 100 MG PO CAPS: 100.0000 mg | ORAL_CAPSULE | Freq: Two times a day (BID) | ORAL | 0 refills | Status: AC

## 2023-12-14 MED ORDER — PROMETHAZINE-DM 6.25-15 MG/5ML PO SYRP: 5.0000 mL | ORAL_SOLUTION | Freq: Two times a day (BID) | ORAL | 0 refills | Status: DC | PRN

## 2023-12-14 MED ORDER — PREDNISONE 20 MG PO TABS: ORAL_TABLET | ORAL | 0 refills | Status: DC

## 2023-12-14 NOTE — ED Triage Notes (Signed)
 Pt here today with husband c/o cough x 2+ weeks. Taking coricidin prn. Hx of pneumonia.

## 2023-12-14 NOTE — ED Provider Notes (Signed)
 [facility]    CSN: [id number] Arrival date & time: 12/14/23  1603      History   Chief Complaint Chief Complaint  Patient presents with   Cough    Chest congestion    HPI Joann Maddox is a 83 y.o. female.   HPI 83 year old female presents with cough for 2 weeks.  Patient is accompanied by her husband and reports history of previous pneumonia. PMH significant for RA, hypothyroidism, and migraines.  Past Medical History:  Diagnosis Date   Allergy    Anxiety    Arthritis    Cataract    Depression    GERD (gastroesophageal reflux disease)    Headache(784.0)    migrains   Heart murmur    Hypothyroidism    Osteoporosis 08/12/2023   DEXA 4/25: t score of    Pre-diabetes    Rheumatoid arthritis(714.0)     Patient Active Problem List   Diagnosis Date Noted   Osteoporosis 08/12/2023   Excessive sleepiness 04/01/2023   Unsteady gait 04/03/2022   Bilateral hearing loss 03/12/2022   Urinary incontinence 12/25/2021   Mild dementia (HCC) 06/24/2021   Coccydynia 12/23/2020   Gastroesophageal reflux disease with esophagitis without hemorrhage 04/05/2020   Iron deficiency anemia 06/22/2019   Lymphatic edema 12/06/2018   Sleep disturbance 12/05/2018   Venous stasis dermatitis of both lower extremities 09/20/2018   Varicose veins of both lower extremities 08/22/2018   Depression, major, single episode, mild (HCC) 08/22/2018   Falls frequently 08/12/2018   Mass of thigh, right 05/17/2018   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   Status post cervical spinal fusion 01/11/2018   Chronic neck pain 10/20/2017   Chronic venous insufficiency 04/29/2017   IFG (impaired fasting glucose) 05/09/2014   Chronic constipation 02/19/2014   Rosacea 11/06/2013   GAD (generalized anxiety disorder) 06/08/2013   Essential hypertension, benign 05/04/2013   Migraine with aura 05/04/2013   Murmur, heart 05/04/2013   Hyperlipidemia 05/04/2013   Subclinical  hyperthyroidism 05/04/2013   Fatigue 02/16/2012   Mixed stress and urge urinary incontinence 02/16/2012   Hyperglycemia 01/12/2012    Past Surgical History:  Procedure Laterality Date   ABDOMINAL HYSTERECTOMY     ANTERIOR CERVICAL DECOMP/DISCECTOMY FUSION N/A 01/03/2018   Procedure: ANTERIOR CERVICAL DECOMPRESSION/DISCECTOMY CORTEZ MOELLER, PLATE;  Surgeon: Barbarann Oneil BROCKS, MD;  Location: [facility] OR;  Service: Orthopedics;  Laterality: N/A;   APPENDECTOMY     CHOLECYSTECTOMY     JOINT REPLACEMENT     Both hips and both knees   REPLACEMENT TOTAL KNEE BILATERAL     SPINE SURGERY     TONSILLECTOMY     as  a child   TOTAL HIP ARTHROPLASTY     right and left    OB History   No obstetric history on file.      Home Medications    Prior to Admission medications   Medication Sig Start Date End Date Taking? Authorizing Provider  doxycycline  (VIBRAMYCIN ) 100 MG capsule Take 1 capsule (100 mg total) by mouth 2 (two) times daily for 7 days. 12/14/23 12/21/23 Yes Teddy Sharper, FNP  predniSONE  (DELTASONE ) 20 MG tablet Take 3 tabs PO daily x 5 days. 12/14/23  Yes Teddy Sharper, FNP  promethazine -dextromethorphan (PROMETHAZINE -DM) 6.25-15 MG/5ML syrup Take 5 mLs by mouth 2 (two) times daily as needed for cough. 12/14/23  Yes Teddy Sharper, FNP  azelaic acid  (AZELEX ) 20 % cream APPLY TOPICALL 2 TIMES A DAY (MORNING AND EVENING). APPLY AFTER SKIN IS WASHED  AND PATTED DRY. 12/25/21   Alvan Dorothyann BIRCH, MD  cyanocobalamin (VITAMIN B12) 1000 MCG tablet Take 5,000 mcg by mouth daily.    [provider]  diclofenac  Sodium (VOLTAREN ) 1 % GEL Apply topically.    [provider]  FLUoxetine  (PROZAC ) 40 MG capsule TAKE 1 CAPSULE (40 MG TOTAL) BY MOUTH DAILY. 07/23/23   Alvan Dorothyann BIRCH, MD  furosemide  (LASIX ) 20 MG tablet TAKE 1 TABLET (20 MG TOTAL) BY MOUTH IN THE MORNING 12/13/23   Alvan Dorothyann BIRCH, MD  gabapentin  (NEURONTIN ) 100 MG capsule TAKE 2 CAPSULES BY MOUTH IN THE  MORNING AND MID AFTERNOON 10/31/23   Alvan Dorothyann BIRCH, MD  hydroxychloroquine  (PLAQUENIL ) 200 MG tablet TALE 2 TABLETS BY MOUTH WITH FOOD OR MILK ONCE A DAY 12/13/23   Alvan Dorothyann BIRCH, MD  ibandronate  (BONIVA ) 150 MG tablet Take 1 tablet (150 mg total) by mouth every 30 (thirty) days. Take in the morning with a full glass of water, on an empty stomach, and do not take anything else by mouth or lie down for the next 30 min. 12/01/23   Alvan Dorothyann BIRCH, MD  losartan  (COZAAR ) 25 MG tablet TAKE 1 TABLET (25 MG TOTAL) BY MOUTH DAILY. 09/15/23   Alvan Dorothyann BIRCH, MD  meloxicam  (MOBIC ) 15 MG tablet TAKE 1 TABLET BY MOUTH EVERY DAY AS NEEDED FOR PAIN 09/15/23   Alvan Dorothyann BIRCH, MD  mirabegron  ER (MYRBETRIQ ) 50 MG TB24 tablet TAKE 1 TABLET BY MOUTH EVERY DAY 07/21/23   Alvan Dorothyann BIRCH, MD  Multiple Vitamin (MULTIVITAMIN) tablet Take 1 tablet by mouth daily.    [provider]  potassium chloride  (KLOR-CON ) 10 MEQ tablet TAKE 1 TABLET BY MOUTH TWICE A DAY 04/16/23   Alvan Dorothyann BIRCH, MD  rosuvastatin  (CRESTOR ) 10 MG tablet TAKE 1 TABLET BY MOUTH EVERYDAY AT BEDTIME 12/13/23   Alvan Dorothyann BIRCH, MD  traMADol  (ULTRAM ) 50 MG tablet TAKE 1 TO 2 TABLETS BY MOUTH 2 (TWO) TIMES DAILY AS NEEDED. 12/29/22   Alvan Dorothyann BIRCH, MD  Vitamin D , Ergocalciferol , (DRISDOL ) 1.25 MG (50000 UNIT) CAPS capsule TAKE 1 CAPSULE (50,000 UNITS TOTAL) BY MOUTH EVERY SUNDAY. 10/31/23   Alvan Dorothyann BIRCH, MD    Family History Family History  Problem Relation Age of Onset   Stroke Mother 20   Vision loss Mother    Heart disease Father 51   Arthritis Brother    Birth defects Son    Diabetes Other        aunt     Social History Social History   Tobacco Use   Smoking status: Never   Smokeless tobacco: Never  Vaping Use   Vaping status: Never Used  Substance Use Topics   Alcohol use: Never   Drug use: Never     Allergies   Iodinated contrast media, Iodine, Codeine, Lipitor  [atorvastatin], Aricept [donepezil], and Memantine hcl   Review of Systems Review of Systems  HENT:  Positive for congestion.   Respiratory:  Positive for cough.   All other systems reviewed and are negative.    Physical Exam Triage Vital Signs ED Triage Vitals  Encounter Vitals Group     BP      Girls Systolic BP Percentile      Girls Diastolic BP Percentile      Boys Systolic BP Percentile      Boys Diastolic BP Percentile      Pulse      Resp      Temp  Temp src      SpO2      Weight      Height      Head Circumference      Peak Flow      Pain Score      Pain Loc      Pain Education      Exclude from Growth Chart    No data found.  Updated Vital Signs BP (!) 148/77 (BP Location: Right Arm)   Pulse 72   Temp 97.7 F (36.5 C) (Oral)   Resp 18   SpO2 97%    Physical Exam Vitals and nursing note reviewed.  Constitutional:      Appearance: Normal appearance. She is obese.  HENT:     Head: Normocephalic and atraumatic.     Right Ear: Tympanic membrane, ear canal and external ear normal.     Left Ear: Tympanic membrane, ear canal and external ear normal.     Mouth/Throat:     Mouth: Mucous membranes are moist.     Pharynx: Oropharynx is clear.  Eyes:     Extraocular Movements: Extraocular movements intact.     Pupils: Pupils are equal, round, and reactive to light.  Cardiovascular:     Rate and Rhythm: Normal rate and regular rhythm.     Pulses: Normal pulses.     Heart sounds: Normal heart sounds.  Pulmonary:     Effort: Pulmonary effort is normal.     Breath sounds: No wheezing, rhonchi or rales.     Comments: Diminished breath sounds noted throughout, frequent nonproductive cough on exam Musculoskeletal:        General: Normal range of motion.     Cervical back: Normal range of motion and neck supple.  Skin:    General: Skin is warm and dry.  Neurological:     General: No focal deficit present.     Mental Status: She is alert and oriented  to person, place, and time. Mental status is at baseline.  Psychiatric:        Mood and Affect: Mood normal.        Behavior: Behavior normal.      UC Treatments / Results  Labs (all labs ordered are listed, but only abnormal results are displayed) Labs Reviewed - No data to display  EKG   Radiology DG Chest 2 View Result Date: 12/14/2023 CLINICAL DATA:  Cough for 2 weeks EXAM: CHEST - 2 VIEW COMPARISON:  12/23/2017, shoulder radiograph 10/11/2018 FINDINGS: The heart degenerative changes of the mid to lower thoracic spine. Advanced bilateral shoulder degenerative change with chronic mild inferior subluxation of right humeral head with respect to glenoid. IMPRESSION: No active cardiopulmonary disease. Electronically Signed   By: Luke Bun M.D.   On: 12/14/2023 17:19    Procedures Procedures (including critical care time)  Medications Ordered in UC Medications - No data to display  Initial Impression / Assessment and Plan / UC Course  I have reviewed the triage vital signs and the nursing notes.  Pertinent labs & imaging results that were available during my care of the patient were reviewed by me and considered in my medical decision making (see chart for details).     MDM: 1.  Acute URI-Rx'd doxycycline  100 mg capsule: Take 1 capsule twice daily x 7 days; 2.  Cough, unspecified type-Rx prednisone  20 mg tablet: Take 3 tablets p.o. daily x 5 days, Rx'd Promethazine  DM 6.25-15 mg / 5 mL syrup: Take 5 mL twice  daily, as needed for cough. Advised patient to take medication as directed with food to completion.  Advised patient to take prednisone  with first dose of doxycycline  for the next 5 of 7 days.  Advised may use Promethazine  DM cough syrup at night prior to sleep for cough due to sedative effects.  Encouraged increase daily water intake to 64 ounces per day while taking these medications.  Advised if symptoms worsen and/or unresolved please follow-up with PCP or here for further  evaluation.  Patient discharged home, hemodynamically stable. Final Clinical Impressions(s) / UC Diagnoses   Final diagnoses:  Cough, unspecified type  Acute URI     Discharge Instructions      Advised patient to take medication as directed with food to completion.  Advised patient to take prednisone  with first dose of doxycycline  for the next 5 of 7 days.  Advised may use Promethazine  DM cough syrup at night prior to sleep for cough due to sedative effects.  Encouraged increase daily water intake to 64 ounces per day while taking these medications.  Advised if symptoms worsen and/or unresolved please follow-up with PCP or here for further evaluation.     ED Prescriptions     Medication Sig Dispense Auth. Provider   doxycycline  (VIBRAMYCIN ) 100 MG capsule Take 1 capsule (100 mg total) by mouth 2 (two) times daily for 7 days. 14 capsule Adelynne Joerger, FNP   predniSONE  (DELTASONE ) 20 MG tablet Take 3 tabs PO daily x 5 days. 15 tablet Sawyer Kahan, FNP   promethazine -dextromethorphan (PROMETHAZINE -DM) 6.25-15 MG/5ML syrup Take 5 mLs by mouth 2 (two) times daily as needed for cough. 118 mL Shilah Hefel, FNP      PDMP not reviewed this encounter.   Teddy Sharper, FNP 12/14/23 1743

## 2023-12-14 NOTE — Discharge Instructions (Addendum)
 Advised patient to take medication as directed with food to completion.  Advised patient to take prednisone  with first dose of doxycycline  for the next 5 of 7 days.  Advised may use Promethazine  DM cough syrup at night prior to sleep for cough due to sedative effects.  Encouraged increase daily water intake to 64 ounces per day while taking these medications.  Advised if symptoms worsen and/or unresolved please follow-up with PCP or here for further evaluation.

## 2023-12-27 ENCOUNTER — Other Ambulatory Visit: Payer: Self-pay | Admitting: Family Medicine

## 2024-02-05 ENCOUNTER — Other Ambulatory Visit: Payer: Self-pay | Admitting: Family Medicine

## 2024-02-05 DIAGNOSIS — F320 Major depressive disorder, single episode, mild: Secondary | ICD-10-CM

## 2024-02-21 ENCOUNTER — Encounter: Payer: Self-pay | Admitting: Family Medicine

## 2024-02-21 DIAGNOSIS — M25561 Pain in right knee: Secondary | ICD-10-CM

## 2024-02-26 ENCOUNTER — Other Ambulatory Visit: Payer: Self-pay | Admitting: Family Medicine

## 2024-03-02 NOTE — Telephone Encounter (Signed)
 Pended referral to orthopedics.  Added dx code of bilateral knee pain  but note also mentioned previous bilateral hip replacements. Did you want to include any diagnosis regarding hips as well?

## 2024-03-06 ENCOUNTER — Encounter: Payer: Self-pay | Admitting: Family Medicine

## 2024-03-07 NOTE — Telephone Encounter (Signed)
 Referral for orthopaedic for bilateral knee pain has been sent  to referral team today .

## 2024-03-16 ENCOUNTER — Ambulatory Visit: Admitting: Sports Medicine

## 2024-03-16 ENCOUNTER — Other Ambulatory Visit (INDEPENDENT_AMBULATORY_CARE_PROVIDER_SITE_OTHER): Payer: Self-pay

## 2024-03-16 ENCOUNTER — Encounter: Payer: Self-pay | Admitting: Sports Medicine

## 2024-03-16 DIAGNOSIS — M25562 Pain in left knee: Secondary | ICD-10-CM

## 2024-03-16 DIAGNOSIS — R2681 Unsteadiness on feet: Secondary | ICD-10-CM

## 2024-03-16 DIAGNOSIS — M6281 Muscle weakness (generalized): Secondary | ICD-10-CM

## 2024-03-16 DIAGNOSIS — M25561 Pain in right knee: Secondary | ICD-10-CM

## 2024-03-16 DIAGNOSIS — G8929 Other chronic pain: Secondary | ICD-10-CM | POA: Diagnosis not present

## 2024-03-16 DIAGNOSIS — Z96653 Presence of artificial knee joint, bilateral: Secondary | ICD-10-CM | POA: Diagnosis not present

## 2024-03-16 MED ORDER — MELOXICAM 15 MG PO TABS: 15.0000 mg | ORAL_TABLET | Freq: Every day | ORAL | 0 refills | Status: AC

## 2024-03-16 NOTE — Progress Notes (Signed)
 Joann Maddox - 83 y.o. female MRN [id number]  Date of birth: [birth year]  Office Visit Note: Visit Date: 03/16/2024 PCP: Joann Dorothyann BIRCH, MD Referred by: Joann Maddox, *  Subjective: Chief Complaint  Patient presents with   Right Knee - Pain   Left Knee - Pain   HPI: Joann Maddox is a pleasant 83 y.o. female who presents today for chronic bilateral knee pain in setting of bilateral TKA.  Her husband, Joann Maddox, is present for the visit and does provide some of the HPI.  She has had pain in both of her knees for the last year or so but has been worse over the last few months.  Her husband does note that she has been her walker for the past few months, she has used canes in the past and this is not as helpful.  She has noticed some unsteadiness with her gait and has been taking more slow, shuffled steps.  Pain is over the anterior aspect of the knee.  She does report some intermittent swelling at times.  No redness or warmth. She does use topical Voltaren  gel which is somewhat helpful.  She reports a history of bilateral TKA.  Chart review through care everywhere demonstrates right knee TKA back on 02/24/2011 with Dr. Elihue.  She does state that her left knee replacement was within a few years of this as well.  Pertinent ROS were reviewed with the patient and found to be negative unless otherwise specified above in HPI.   Assessment & Plan: Visit Diagnoses:  1. Chronic pain of both knees   2. Weakness of both quadriceps muscles   3. History of knee replacement, total, bilateral   4. Unsteady gait    Plan: Impression is acute on chronic bilateral knee pain in the setting of previous TKA's around 2012.  X-rays demonstrate no hardware abnormality from previous TKA.  She does have anterior knee pain and this is more generalized with associated quadricep and proximal leg weakness.  She does have some unsteady gait both reported as well as on physical exam with her gait analysis  and a slow shuffled gait.  I do think it is more of her proximal leg weakness that is causing her knee pain as opposed to abnormality with her knee replacements.  Given this, we discussed treatment options.  I would like to get her started in formalized physical therapy to work on quadricep strengthening as well as balance and gait therapy.  We will trial meloxicam  15 mg daily for the next few weeks and she will let me know what degree of relief she receives from this.  She may continue her Voltaren  gel topically as needed as well.  I will see her back in 6 weeks to reevaluate the progress she has made with the above.  Follow-up: Return in about 6 weeks (around 04/27/2024) for Bilateral knee pain   Meds & Orders:  Meds ordered this encounter  Medications   meloxicam  (MOBIC ) 15 MG tablet    Sig: Take 1 tablet (15 mg total) by mouth daily.    Dispense:  30 tablet    Refill:  0    Orders Placed This Encounter  Procedures   XR KNEE 3 VIEW RIGHT   XR KNEE 3 VIEW LEFT   Ambulatory referral to Physical Therapy     Procedures: No procedures performed      Clinical History: No specialty comments available.  She reports that she has never smoked. She  has never used smokeless tobacco.  Recent Labs    04/01/23 1344 08/05/23 1359  HGBA1C 5.6 5.4    Objective:     Physical Exam  Gen: Well-appearing, in no acute distress; non-toxic CV: Well-perfused. Warm.  Resp: Breathing unlabored on room air; no wheezing. Psych: Fluid speech in conversation; appropriate affect; normal thought process  Ortho Exam - Bilateral knees: There are well-healed vertical incisions from previous TKA bilaterally without redness swelling or skin abnormality.  Range of motion 0-115 degrees bilaterally.  There is mild patellofemoral crepitus.  There is generalized weakness of the quadriceps.  - Gait analysis: Patient takes slow, shuffled steps with the assistance of a walker, there is proximal leg weakness  bilaterally.  No varus or valgus instability about the knee joint.  Imaging: XR KNEE 3 VIEW RIGHT Result Date: 03/16/2024 Three-view x-ray of bilateral knees including standing AP, lateral and sunrise views were ordered and reviewed by myself today.  X-rays demonstrate both right and left knee total knee arthroplasty that is well-seated.  There is good cement Tatian.  There is no hypoechoic change to suggest loosening or hardware malfunction.  There is notable cortical chondromalacia of the underlying patella b/l.  Mild osteophytosis of the left knee greater than right knee in the posterior knee joint.  No acute fracture noted.  XR KNEE 3 VIEW LEFT Result Date: 03/16/2024 Three-view x-ray of bilateral knees including standing AP, lateral and sunrise views were ordered and reviewed by myself today.  X-rays demonstrate both right and left knee total knee arthroplasty that is well-seated.  There is good cement Tatian.  There is no hypoechoic change to suggest loosening or hardware malfunction.  There is notable cortical chondromalacia of the underlying patella b/l.  Mild osteophytosis of the left knee greater than right knee in the posterior knee joint.  No acute fracture noted.   Past Medical/Family/Surgical/Social History: Medications & Allergies reviewed per EMR, new medications updated. Patient Active Problem List   Diagnosis Date Noted   Osteoporosis 08/12/2023   Excessive sleepiness 04/01/2023   Unsteady gait 04/03/2022   Bilateral hearing loss 03/12/2022   Urinary incontinence 12/25/2021   Mild dementia (HCC) 06/24/2021   Coccydynia 12/23/2020   Gastroesophageal reflux disease with esophagitis without hemorrhage 04/05/2020   Iron deficiency anemia 06/22/2019   Lymphatic edema 12/06/2018   Sleep disturbance 12/05/2018   Venous stasis dermatitis of both lower extremities 09/20/2018   Varicose veins of both lower extremities 08/22/2018   Depression, major, single episode, mild  08/22/2018   Falls frequently 08/12/2018   Mass of thigh, right 05/17/2018   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   Status post cervical spinal fusion 01/11/2018   Chronic neck pain 10/20/2017   Chronic venous insufficiency 04/29/2017   IFG (impaired fasting glucose) 05/09/2014   Chronic constipation 02/19/2014   Rosacea 11/06/2013   GAD (generalized anxiety disorder) 06/08/2013   Essential hypertension, benign 05/04/2013   Migraine with aura 05/04/2013   Murmur, heart 05/04/2013   Hyperlipidemia 05/04/2013   Subclinical hyperthyroidism 05/04/2013   Fatigue 02/16/2012   Mixed stress and urge urinary incontinence 02/16/2012   Hyperglycemia 01/12/2012   Past Medical History:  Diagnosis Date   Allergy    Anxiety    Arthritis    Cataract    Depression    GERD (gastroesophageal reflux disease)    Headache(784.0)    migrains   Heart murmur    Hypothyroidism    Osteoporosis 08/12/2023   DEXA 4/25: t score of  Pre-diabetes    Rheumatoid arthritis(714.0)    Family History  Problem Relation Age of Onset   Stroke Mother 5   Vision loss Mother    Heart disease Father 75   Arthritis Brother    Birth defects Son    Diabetes Other        aunt    Past Surgical History:  Procedure Laterality Date   ABDOMINAL HYSTERECTOMY     ANTERIOR CERVICAL DECOMP/DISCECTOMY FUSION N/A 01/03/2018   Procedure: ANTERIOR CERVICAL DECOMPRESSION/DISCECTOMY FUSION, ALLOGRAFT, PLATE;  Surgeon: Barbarann Oneil BROCKS, MD;  Location: [facility] OR;  Service: Orthopedics;  Laterality: N/A;   APPENDECTOMY     CHOLECYSTECTOMY     JOINT REPLACEMENT     Both hips and both knees   REPLACEMENT TOTAL KNEE BILATERAL     SPINE SURGERY     TONSILLECTOMY     as  a child   TOTAL HIP ARTHROPLASTY     right and left   Social History   Occupational History   Occupation: Retired Runner, Broadcasting/film/video    Comment: [employer]r  Tobacco Use   Smoking status: Never   Smokeless tobacco: Never  Vaping Use   Vaping  status: Never Used  Substance and Sexual Activity   Alcohol use: Never   Drug use: Never   Sexual activity: Not Currently

## 2024-03-16 NOTE — Progress Notes (Signed)
 Patient says that she has had pain in both knees for a few months now. She says that she had both knees replaced years ago, and has done well with those until recently. She says that her pain is about the same in both. She denies any popping or clicking, and denies any known injury. She has tried topical Diclofenac  with minimal relief.

## 2024-03-17 ENCOUNTER — Encounter: Payer: Self-pay | Admitting: Sports Medicine

## 2024-04-10 ENCOUNTER — Other Ambulatory Visit: Payer: Self-pay | Admitting: Family Medicine

## 2024-04-23 ENCOUNTER — Other Ambulatory Visit: Payer: Self-pay | Admitting: Family Medicine

## 2024-04-28 ENCOUNTER — Encounter: Payer: Self-pay | Admitting: Sports Medicine

## 2024-04-28 ENCOUNTER — Ambulatory Visit: Admitting: Sports Medicine

## 2024-04-28 ENCOUNTER — Ambulatory Visit (HOSPITAL_COMMUNITY)
Admission: RE | Admit: 2024-04-28 | Discharge: 2024-04-28 | Disposition: A | Discharge status: Home / Self Care | Source: Ambulatory Visit | Attending: Sports Medicine | Admitting: Sports Medicine

## 2024-04-28 DIAGNOSIS — Z96653 Presence of artificial knee joint, bilateral: Secondary | ICD-10-CM

## 2024-04-28 DIAGNOSIS — M19012 Primary osteoarthritis, left shoulder: Secondary | ICD-10-CM

## 2024-04-28 DIAGNOSIS — M7989 Other specified soft tissue disorders: Secondary | ICD-10-CM

## 2024-04-28 DIAGNOSIS — G8929 Other chronic pain: Secondary | ICD-10-CM | POA: Diagnosis not present

## 2024-04-28 DIAGNOSIS — M25562 Pain in left knee: Secondary | ICD-10-CM | POA: Diagnosis not present

## 2024-04-28 DIAGNOSIS — M25561 Pain in right knee: Secondary | ICD-10-CM

## 2024-04-28 DIAGNOSIS — R2681 Unsteadiness on feet: Secondary | ICD-10-CM

## 2024-04-28 DIAGNOSIS — M6281 Muscle weakness (generalized): Secondary | ICD-10-CM | POA: Diagnosis not present

## 2024-04-28 MED ORDER — TRAMADOL HCL 50 MG PO TABS: ORAL_TABLET | ORAL | 0 refills | Status: AC

## 2024-04-28 NOTE — Progress Notes (Signed)
 Patient says that she is having trouble with the knees, although no new pain or symptoms. She is unable to take Meloxicam  as it upsets her stomach. She does want to go to physical therapy, but has not been in the time since her last visit.

## 2024-04-28 NOTE — Progress Notes (Signed)
 "  Joann Maddox - 84 y.o. female MRN [id number]  Date of birth: [birth year]  Office Visit Note: Visit Date: 04/28/2024 PCP: Alvan Dorothyann BIRCH, MD Referred by: Alvan Dorothyann BIRCH, MD  Subjective: Chief Complaint  Patient presents with   Right Knee - Follow-up   Left Knee - Follow-up   HPI: Joann Maddox is a pleasant 84 y.o. female who presents today for  chronic bilateral knee pain in setting of bilateral TKA.  Saw her back in November 2025.  X-rays showed no hardware abnormality from her previous TKA's, but was having more anterior knee pain in the setting of quadricep and proximal leg weakness.  Did refer her to formalized physical therapy but unfortunately were not able to connect with her to start. She did begin oral meloxicam  15mg  but only took this a few days before stomach/GI upset. Currently, she is using a motorized wheelchair.  Does use a tripod-walker at home. Pain is at least 5/10 daily, limiting her walking/mobility.  Her husband, Christopher, does note that she had some left leg swelling and redness over the last few weeks.  The redness is still present but improving but still has persistent left leg swelling.  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  Pertinent ROS were reviewed with the patient and found to be negative unless otherwise specified above in HPI.   Assessment & Plan: Visit Diagnoses:  1. Chronic pain of both knees   2. Left leg swelling   3. Weakness of both quadriceps muscles   4. History of knee replacement, total, bilateral   5. Unsteady gait    Plan: Impression is chronic bilateral knee pain in the setting of previous TKA's around 2012.  X-rays and Degele exam do not show any evidence of hardware abnormality, she does have rather notable quadricep and proximal leg weakness with a slow, shuffling gait.  I do think this is the main origin of her pain as well as an unsteady gait.  We will send a repeat referral to formalized physical  therapy, did provide patient with contact as well as phone number to call to schedule if they do not hear over the next week.  We will discontinue meloxicam  given her side effect profile.  I would like to trial tramadol  which she may take 1/2-1 full tablet once to twice daily to see if this improves her pain so that she is able to walk better.  She has had a few weeks of unilateral leg swelling and pain, calf circumference 2 cm greater on the left today.  Given this and her history of 1 previous DVT, we will move forward with lower extremity duplex ultrasound to rule out DVT.  I will call her with these results.  Otherwise we will follow-up in 2 months for her knees to see how she responds to PT.  Follow-up: Return in about 2 months (around 06/26/2024) for Bilateral legs/knees   Meds & Orders: No orders of the defined types were placed in this encounter.  No orders of the defined types were placed in this encounter.    Procedures: No procedures performed      Clinical History: No specialty comments available.  She reports that she has never smoked. She has never used smokeless tobacco.  Recent Labs    08/05/23 1359  HGBA1C 5.4    Objective:    Physical Exam  Gen: Well-appearing, in no acute distress; non-toxic CV: Well-perfused. Warm.  Resp: Breathing unlabored on room air; no wheezing. Psych: Fluid speech in conversation; appropriate affect; normal thought process  Ortho Exam - Bilateral knees: Well-healed vertical incisions from previous TKA.  There is generalized weakness of the quadriceps and leg muscles.  There is pitting edema of the left leg from the tibial tuberosity distally.  Mild increased red pallor of the left calf and leg.  Calf circumference: - Right leg: 34 cm - Left leg: 36 cm  Imaging: No results found.  Past Medical/Family/Surgical/Social History: Medications & Allergies reviewed per EMR, new medications updated. 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/24/2021   Coccydynia 12/23/2020   Gastroesophageal reflux disease with esophagitis without hemorrhage 04/05/2020   Iron deficiency anemia 06/22/2019   Lymphatic edema 12/06/2018   Sleep disturbance 12/05/2018   Venous stasis dermatitis of both lower extremities 09/20/2018   Varicose veins of both lower extremities 08/22/2018   Depression, major, single episode, mild 08/22/2018   Falls frequently 08/12/2018   Mass of thigh, right 05/17/2018   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   Status post cervical spinal fusion 01/11/2018   Chronic neck pain 10/20/2017   Chronic venous insufficiency 04/29/2017   IFG (impaired fasting glucose) 05/09/2014   Chronic constipation 02/19/2014   Rosacea 11/06/2013   GAD (generalized anxiety disorder) 06/08/2013   Essential hypertension, benign 05/04/2013   Migraine with aura 05/04/2013   Murmur, heart 05/04/2013   Hyperlipidemia 05/04/2013   Subclinical hyperthyroidism 05/04/2013   Fatigue 02/16/2012   Mixed stress and urge urinary incontinence 02/16/2012   Hyperglycemia 01/12/2012   Past Medical History:  Diagnosis Date   Allergy    Anxiety    Arthritis    Cataract    Depression    GERD (gastroesophageal reflux disease)    Headache(784.0)    migrains   Heart murmur    Hypothyroidism    Osteoporosis 08/12/2023   DEXA 4/25: t score of    Pre-diabetes    Rheumatoid arthritis(714.0)    Family History  Problem Relation Age of Onset   Stroke Mother 32   Vision loss Mother    Heart disease Father 44   Arthritis Brother    Birth defects Son    Diabetes Other        aunt    Past Surgical History:  Procedure Laterality Date   ABDOMINAL HYSTERECTOMY     ANTERIOR CERVICAL DECOMP/DISCECTOMY FUSION N/A 01/03/2018   Procedure: ANTERIOR  CERVICAL DECOMPRESSION/DISCECTOMY FUSION, ALLOGRAFT, PLATE;  Surgeon: Barbarann Oneil BROCKS, MD;  Location: [facility] OR;  Service: Orthopedics;  Laterality: N/A;   APPENDECTOMY     CHOLECYSTECTOMY     JOINT REPLACEMENT     Both hips and both knees   REPLACEMENT TOTAL KNEE BILATERAL     SPINE SURGERY     TONSILLECTOMY     as  a child   TOTAL HIP ARTHROPLASTY     right and left   Social History   Occupational History   Occupation: Retired Runner, Broadcasting/film/video    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

## 2024-05-01 ENCOUNTER — Ambulatory Visit: Payer: Self-pay | Admitting: Sports Medicine

## 2024-05-05 NOTE — Therapy (Incomplete)
 " OUTPATIENT PHYSICAL THERAPY LOWER EXTREMITY EVALUATION   Patient Name: Joann Maddox MRN: [id number] DOB:07/27/1940, 84 y.o., female Today's Date: 05/05/2024  END OF SESSION:   Past Medical History:  Diagnosis Date   Allergy    Anxiety    Arthritis    Cataract    Depression    GERD (gastroesophageal reflux disease)    Headache(784.0)    migrains   Heart murmur    Hypothyroidism    Osteoporosis 08/12/2023   DEXA 4/25: t score of    Pre-diabetes    Rheumatoid arthritis(714.0)    Past Surgical History:  Procedure Laterality Date   ABDOMINAL HYSTERECTOMY     ANTERIOR CERVICAL DECOMP/DISCECTOMY FUSION N/A 01/03/2018   Procedure: ANTERIOR CERVICAL DECOMPRESSION/DISCECTOMY FUSION, ALLOGRAFT, PLATE;  Surgeon: Barbarann Oneil BROCKS, MD;  Location: [facility] OR;  Service: Orthopedics;  Laterality: N/A;   APPENDECTOMY     CHOLECYSTECTOMY     JOINT REPLACEMENT     Both hips and both knees   REPLACEMENT TOTAL KNEE BILATERAL     SPINE SURGERY     TONSILLECTOMY     as  a child   TOTAL HIP ARTHROPLASTY     right and left   Patient Active Problem List   Diagnosis Date Noted   Osteoporosis 08/12/2023   Excessive sleepiness 04/01/2023   Unsteady gait 04/03/2022   Bilateral hearing loss 03/12/2022   Urinary incontinence 12/25/2021   Mild dementia (HCC) 06/24/2021   Coccydynia 12/23/2020   Gastroesophageal reflux disease with esophagitis without hemorrhage 04/05/2020   Iron deficiency anemia 06/22/2019   Lymphatic edema 12/06/2018   Sleep disturbance 12/05/2018   Venous stasis dermatitis of both lower extremities 09/20/2018   Varicose veins of both lower extremities 08/22/2018   Depression, major, single episode, mild 08/22/2018   Falls frequently 08/12/2018   Mass of thigh, right 05/17/2018   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   Status post cervical spinal fusion 01/11/2018   Chronic neck pain 10/20/2017   Chronic venous insufficiency 04/29/2017   IFG (impaired fasting  glucose) 05/09/2014   Chronic constipation 02/19/2014   Rosacea 11/06/2013   GAD (generalized anxiety disorder) 06/08/2013   Essential hypertension, benign 05/04/2013   Migraine with aura 05/04/2013   Murmur, heart 05/04/2013   Hyperlipidemia 05/04/2013   Subclinical hyperthyroidism 05/04/2013   Fatigue 02/16/2012   Mixed stress and urge urinary incontinence 02/16/2012   Hyperglycemia 01/12/2012    PCP: Alvan Dorothyann BIRCH, MD  REFERRING PROVIDER:   Burnetta Brunet, DO    REFERRING DIAG:  M25.561,M25.562,G89.29 (ICD-10-CM) - Chronic pain of both knees  M62.81 (ICD-10-CM) - Weakness of both quadriceps muscles  Z96.653 (ICD-10-CM) - History of knee replacement, total, bilateral  R26.81 (ICD-10-CM) - Unsteady gait    THERAPY DIAG:  No diagnosis found.  Rationale for Evaluation and Treatment: Rehabilitation  ONSET DATE: ***  SUBJECTIVE:   SUBJECTIVE STATEMENT: ***  PERTINENT HISTORY: Bilateral THA Bilateral TKA Spine surgery  RA Osteoporosis  PAIN:  Are you having pain? Yes: NPRS scale: *** Pain location: *** Pain description: *** Aggravating factors: *** Relieving factors: ***  PRECAUTIONS: Other: osteoporosis  RED FLAGS: {PT Red Flags:29287}   WEIGHT BEARING RESTRICTIONS: No  FALLS:  Has patient fallen in last 6 months? {fallsyesno:27318}  LIVING ENVIRONMENT: Lives with: {[facility] lives with:25569::lives with their family} Lives in: {Lives in:25570} Stairs: {opstairs:27293} Has following equipment at home: {Assistive devices:23999}  OCCUPATION: ***  PLOF: {PLOF:24004}  PATIENT GOALS: ***  NEXT MD VISIT: 06/26/24  OBJECTIVE:  Note:  Objective measures were completed at Evaluation unless otherwise noted.  DIAGNOSTIC FINDINGS:  knee x-ray:  X-rays demonstrate both right and left knee total knee arthroplasty that is well-seated.  There is good cement Tatian.  There is no hypoechoic change to suggest loosening or hardware malfunction.  There is  notable cortical chondromalacia of the underlying patella b/l.  Mild osteophytosis of the left knee greater than right knee in the posterior knee joint.  No acute fracture noted.    PATIENT SURVEYS:  {rehab surveys:24030}  COGNITION: Overall cognitive status: {cognition:24006}     SENSATION: {sensation:27233}  EDEMA:  {edema:24020}  MUSCLE LENGTH: Hamstrings: Right *** deg; Left *** deg Debby test: Right *** deg; Left *** deg  POSTURE: {posture:25561}  PALPATION: ***  LOWER EXTREMITY ROM:  Active ROM Right eval Left eval  Hip flexion    Hip extension    Hip abduction    Hip adduction    Hip internal rotation    Hip external rotation    Knee flexion    Knee extension    Ankle dorsiflexion    Ankle plantarflexion    Ankle inversion    Ankle eversion     (Blank rows = not tested)  LOWER EXTREMITY MMT:  MMT Right eval Left eval  Hip flexion    Hip extension    Hip abduction    Hip adduction    Hip internal rotation    Hip external rotation    Knee flexion    Knee extension    Ankle dorsiflexion    Ankle plantarflexion    Ankle inversion    Ankle eversion     (Blank rows = not tested)  LOWER EXTREMITY SPECIAL TESTS:  Not tested   FUNCTIONAL TESTS:  {Functional tests:24029}  GAIT: Distance walked: *** Assistive device utilized: {Assistive devices:23999} Level of assistance: {Levels of assistance:24026} Comments: ***                                                                                                                                [facility] Adult PT Treatment:                                                DATE: 05/08/24 Therapeutic Exercise: *** Manual Therapy: *** Neuromuscular re-ed: *** Therapeutic Activity: *** Modalities: *** Self Care: ***    PATIENT EDUCATION:  Education details: see treatment; POC Person educated: Patient Education method: Explanation, Demonstration, Tactile cues, Verbal cues, and  Handouts Education comprehension: verbalized understanding, returned demonstration, verbal cues required, tactile cues required, and needs further education  HOME EXERCISE PROGRAM: ***  ASSESSMENT:  CLINICAL IMPRESSION: Patient is a 84 y.o. female who was seen today for physical therapy evaluation and treatment for bilateral knee pain. Upon assessment she is noted to have ***. She will benefit from  skilled PT to address the above stated deficits in order to optimize her function and assist in overall pain reduction.    OBJECTIVE IMPAIRMENTS: {opptimpairments:25111}.   ACTIVITY LIMITATIONS: {activitylimitations:27494}  PARTICIPATION LIMITATIONS: {participationrestrictions:25113}  PERSONAL FACTORS: {Personal factors:25162} are also affecting patient's functional outcome.   REHAB POTENTIAL: {rehabpotential:25112}  CLINICAL DECISION MAKING: {clinical decision making:25114}  EVALUATION COMPLEXITY: {Evaluation complexity:25115}   GOALS: Goals reviewed with patient? Yes  SHORT TERM GOALS: Target date: *** *** Baseline: Goal status: INITIAL  2.  *** Baseline:  Goal status: INITIAL  3.  *** Baseline:  Goal status: INITIAL  4.  *** Baseline:  Goal status: INITIAL  5.  *** Baseline:  Goal status: INITIAL  6.  *** Baseline:  Goal status: INITIAL  LONG TERM GOALS: Target date: ***  *** Baseline:  Goal status: INITIAL  2.  *** Baseline:  Goal status: INITIAL  3.  *** Baseline:  Goal status: INITIAL  4.  *** Baseline:  Goal status: INITIAL  5.  *** Baseline:  Goal status: INITIAL  6.  *** Baseline:  Goal status: INITIAL   PLAN:  PT FREQUENCY: 1-2x/week  PT DURATION: {rehab duration:25117}  PLANNED INTERVENTIONS: {rehab planned interventions:25118::97110-Therapeutic exercises,97530- Therapeutic [phone]- Neuromuscular re-education,97535- Self Rjmz,02859- Manual therapy,Patient/Family education}  PLAN FOR NEXT SESSION:  ***  Lucie Meeter, PT, DPT, ATC 05/05/2024 10:08 AM  "

## 2024-05-08 ENCOUNTER — Ambulatory Visit

## 2024-05-08 ENCOUNTER — Telehealth: Payer: Self-pay

## 2024-05-08 ENCOUNTER — Ambulatory Visit: Attending: Family Medicine | Admitting: Rehabilitative and Restorative Service Providers"

## 2024-05-08 ENCOUNTER — Other Ambulatory Visit: Payer: Self-pay

## 2024-05-08 ENCOUNTER — Ambulatory Visit
Admission: EM | Admit: 2024-05-08 | Discharge: 2024-05-08 | Disposition: A | Discharge status: Home / Self Care | Attending: Family Medicine | Admitting: Family Medicine

## 2024-05-08 ENCOUNTER — Encounter: Payer: Self-pay | Admitting: Rehabilitative and Restorative Service Providers"

## 2024-05-08 DIAGNOSIS — R059 Cough, unspecified: Secondary | ICD-10-CM

## 2024-05-08 DIAGNOSIS — G8929 Other chronic pain: Secondary | ICD-10-CM | POA: Diagnosis not present

## 2024-05-08 DIAGNOSIS — M25562 Pain in left knee: Secondary | ICD-10-CM | POA: Diagnosis not present

## 2024-05-08 DIAGNOSIS — M25561 Pain in right knee: Secondary | ICD-10-CM | POA: Diagnosis not present

## 2024-05-08 DIAGNOSIS — J069 Acute upper respiratory infection, unspecified: Secondary | ICD-10-CM

## 2024-05-08 DIAGNOSIS — M6281 Muscle weakness (generalized): Secondary | ICD-10-CM | POA: Diagnosis present

## 2024-05-08 MED ORDER — PREDNISONE 20 MG PO TABS: ORAL_TABLET | ORAL | 0 refills | Status: AC

## 2024-05-08 MED ORDER — DOXYCYCLINE HYCLATE 100 MG PO CAPS: 100.0000 mg | ORAL_CAPSULE | Freq: Two times a day (BID) | ORAL | 0 refills | Status: AC

## 2024-05-08 MED ORDER — PROMETHAZINE-DM 6.25-15 MG/5ML PO SYRP: 5.0000 mL | ORAL_SOLUTION | Freq: Two times a day (BID) | ORAL | 0 refills | Status: AC | PRN

## 2024-05-08 NOTE — Telephone Encounter (Signed)
 Call made to pt to give cxr results. Per Ozell Major, FNP cxr neg for pneumonia. Continue meds as prescribed and f/u with PCP as needed.

## 2024-05-08 NOTE — ED Triage Notes (Signed)
 Pt presenting with c/o non productive cough x 2 weeks. Pt denies any other symptoms. No medication taken for symptom.

## 2024-05-08 NOTE — Discharge Instructions (Addendum)
 Advised patient/husband take medication as directed with food to completion.  Advised take prednisone  first dose of doxycycline  for the next 5 of 7 days.  Advised may use Promethazine  DM for cough at night prior to sleep due to sedative effects.  Encouraged increase daily water intake to 64 ounces per day while taking these medications.  Advised if symptoms worsen and/or unresolved please follow-up with PCP or here for further evaluation.

## 2024-05-08 NOTE — ED Provider Notes (Signed)
 " [facility]    CSN: [id number] Arrival date & time: 05/08/24  1509      History   Chief Complaint Chief Complaint  Patient presents with   Cough    HPI Joann Maddox is a 84 y.o. female.   HPI Very pleasant 84 year old female presents with nonproductive cough for 2 weeks.  Patient is accompanied by her husband today.  PMH significant for obesity, HTN, and rheumatoid arthritis.  Past Medical History:  Diagnosis Date   Allergy    Anxiety    Arthritis    Cataract    Depression    GERD (gastroesophageal reflux disease)    Headache(784.0)    migrains   Heart murmur    Hypothyroidism    Osteoporosis 08/12/2023   DEXA 4/25: t score of    Pre-diabetes    Rheumatoid arthritis(714.0)     Patient Active Problem List   Diagnosis Date Noted   Osteoporosis 08/12/2023   Excessive sleepiness 04/01/2023   Unsteady gait 04/03/2022   Bilateral hearing loss 03/12/2022   Urinary incontinence 12/25/2021   Mild dementia (HCC) 06/24/2021   Coccydynia 12/23/2020   Gastroesophageal reflux disease with esophagitis without hemorrhage 04/05/2020   Iron deficiency anemia 06/22/2019   Lymphatic edema 12/06/2018   Sleep disturbance 12/05/2018   Venous stasis dermatitis of both lower extremities 09/20/2018   Varicose veins of both lower extremities 08/22/2018   Depression, major, single episode, mild 08/22/2018   Falls frequently 08/12/2018   Mass of thigh, right 05/17/2018   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   Status post cervical spinal fusion 01/11/2018   Chronic neck pain 10/20/2017   Chronic venous insufficiency 04/29/2017   IFG (impaired fasting glucose) 05/09/2014   Chronic constipation 02/19/2014   Rosacea 11/06/2013   GAD (generalized anxiety disorder) 06/08/2013   Essential hypertension, benign 05/04/2013   Migraine with aura 05/04/2013   Murmur, heart 05/04/2013   Hyperlipidemia 05/04/2013   Subclinical hyperthyroidism 05/04/2013   Fatigue  02/16/2012   Mixed stress and urge urinary incontinence 02/16/2012   Hyperglycemia 01/12/2012    Past Surgical History:  Procedure Laterality Date   ABDOMINAL HYSTERECTOMY     ANTERIOR CERVICAL DECOMP/DISCECTOMY FUSION N/A 01/03/2018   Procedure: ANTERIOR CERVICAL DECOMPRESSION/DISCECTOMY FUSION, ALLOGRAFT, PLATE;  Surgeon: Barbarann Oneil BROCKS, MD;  Location: [facility] OR;  Service: Orthopedics;  Laterality: N/A;   APPENDECTOMY     CHOLECYSTECTOMY     JOINT REPLACEMENT     Both hips and both knees   REPLACEMENT TOTAL KNEE BILATERAL     SPINE SURGERY     TONSILLECTOMY     as  a child   TOTAL HIP ARTHROPLASTY     right and left    OB History   No obstetric history on file.      Home Medications    Prior to Admission medications  Medication Sig Start Date End Date Taking? Authorizing Provider  doxycycline  (VIBRAMYCIN ) 100 MG capsule Take 1 capsule (100 mg total) by mouth 2 (two) times daily for 7 days. 05/08/24 05/15/24 Yes Teddy Sharper, FNP  predniSONE  (DELTASONE ) 20 MG tablet Take 3 tabs PO daily x 5 days. 05/08/24  Yes Teddy Sharper, FNP  promethazine -dextromethorphan (PROMETHAZINE -DM) 6.25-15 MG/5ML syrup Take 5 mLs by mouth 2 (two) times daily as needed for cough. 05/08/24  Yes Teddy Sharper, FNP  azelaic acid  (AZELEX ) 20 % cream APPLY TOPICALL 2 TIMES A DAY (MORNING AND EVENING). APPLY AFTER SKIN IS WASHED AND PATTED DRY. 12/25/21  Alvan Dorothyann BIRCH, MD  cyanocobalamin (VITAMIN B12) 1000 MCG tablet Take 5,000 mcg by mouth daily.    [provider]  diclofenac  Sodium (VOLTAREN ) 1 % GEL Apply topically.    [provider]  FLUoxetine  (PROZAC ) 40 MG capsule TAKE 1 CAPSULE (40 MG TOTAL) BY MOUTH DAILY. 02/07/24   Alvan Dorothyann BIRCH, MD  furosemide  (LASIX ) 20 MG tablet TAKE 1 TABLET (20 MG TOTAL) BY MOUTH IN THE MORNING 12/13/23   Alvan Dorothyann BIRCH, MD  gabapentin  (NEURONTIN ) 100 MG capsule TAKE 2 CAPSULES BY MOUTH IN THE MORNING AND MID AFTERNOON 04/24/24    Alvan Dorothyann BIRCH, MD  hydroxychloroquine  (PLAQUENIL ) 200 MG tablet TALE 2 TABLETS BY MOUTH WITH FOOD OR MILK ONCE A DAY 02/07/24   Alvan Dorothyann BIRCH, MD  ibandronate  (BONIVA ) 150 MG tablet Take 1 tablet (150 mg total) by mouth every 30 (thirty) days. Take in the morning with a full glass of water, on an empty stomach, and do not take anything else by mouth or lie down for the next 30 min. 12/01/23   Alvan Dorothyann BIRCH, MD  losartan  (COZAAR ) 25 MG tablet TAKE 1 TABLET (25 MG TOTAL) BY MOUTH DAILY. 09/15/23   Alvan Dorothyann BIRCH, MD  meloxicam  (MOBIC ) 15 MG tablet Take 1 tablet (15 mg total) by mouth daily. 03/16/24   Brooks, Dana, DO  mirabegron  ER (MYRBETRIQ ) 50 MG TB24 tablet TAKE 1 TABLET BY MOUTH EVERY DAY 07/21/23   Alvan Dorothyann BIRCH, MD  Multiple Vitamin (MULTIVITAMIN) tablet Take 1 tablet by mouth daily.    [provider]  potassium chloride  (KLOR-CON ) 10 MEQ tablet TAKE 1 TABLET BY MOUTH TWICE A DAY 04/10/24   Alvan Dorothyann BIRCH, MD  rosuvastatin  (CRESTOR ) 10 MG tablet TAKE 1 TABLET BY MOUTH EVERYDAY AT BEDTIME 12/13/23   Alvan Dorothyann BIRCH, MD  traMADol  (ULTRAM ) 50 MG tablet Take 1/2 to 1 tablet once to twice daily as needed. 04/28/24   Burnetta Brunet, DO  Vitamin D , Ergocalciferol , (DRISDOL ) 1.25 MG (50000 UNIT) CAPS capsule TAKE 1 CAPSULE (50,000 UNITS TOTAL) BY MOUTH EVERY SUNDAY. 10/31/23   Alvan Dorothyann BIRCH, MD    Family History Family History  Problem Relation Age of Onset   Stroke Mother 51   Vision loss Mother    Heart disease Father 42   Arthritis Brother    Birth defects Son    Diabetes Other        aunt     Social History Social History[1]   Allergies   Iodinated contrast media, Iodine, Codeine, Lipitor [atorvastatin], Aricept [donepezil], and Memantine hcl   Review of Systems Review of Systems  HENT:  Positive for congestion.   Respiratory:  Positive for cough.   All other systems reviewed and are negative.    Physical  Exam Triage Vital Signs ED Triage Vitals  Encounter Vitals Group     BP 05/08/24 1536 127/77     Girls Systolic BP Percentile --      Girls Diastolic BP Percentile --      Boys Systolic BP Percentile --      Boys Diastolic BP Percentile --      Pulse Rate 05/08/24 1536 63     Resp 05/08/24 1536 18     Temp 05/08/24 1536 98.7 F (37.1 C)     Temp Source 05/08/24 1536 Oral     SpO2 05/08/24 1536 94 %     Weight 05/08/24 1540 165 lb (74.8 kg)     Height 05/08/24  1540 4' 10 (1.473 m)     Head Circumference --      Peak Flow --      Pain Score 05/08/24 1539 0     Pain Loc --      Pain Education --      Exclude from Growth Chart --    No data found.  Updated Vital Signs BP 127/77 (BP Location: Right Arm)   Pulse 63   Temp 98.7 F (37.1 C) (Oral)   Resp 18   Ht 4' 10 (1.473 m)   Wt 165 lb (74.8 kg)   SpO2 94%   BMI 34.49 kg/m   Visual Acuity Right Eye Distance:   Left Eye Distance:   Bilateral Distance:    Right Eye Near:   Left Eye Near:    Bilateral Near:     Physical Exam Vitals and nursing note reviewed.  Constitutional:      Appearance: Normal appearance. She is obese.  HENT:     Head: Normocephalic and atraumatic.     Right Ear: Tympanic membrane, ear canal and external ear normal.     Left Ear: Tympanic membrane, ear canal and external ear normal.     Mouth/Throat:     Mouth: Mucous membranes are moist.     Pharynx: Oropharynx is clear.  Eyes:     Extraocular Movements: Extraocular movements intact.     Conjunctiva/sclera: Conjunctivae normal.     Pupils: Pupils are equal, round, and reactive to light.  Cardiovascular:     Rate and Rhythm: Normal rate and regular rhythm.     Heart sounds: Normal heart sounds.  Pulmonary:     Effort: Pulmonary effort is normal.     Breath sounds: Normal breath sounds. No wheezing, rhonchi or rales.  Skin:    General: Skin is warm and dry.  Neurological:     General: No focal deficit present.     Mental Status:  She is alert and oriented to person, place, and time. Mental status is at baseline.  Psychiatric:        Mood and Affect: Mood normal.        Behavior: Behavior normal.      UC Treatments / Results  Labs (all labs ordered are listed, but only abnormal results are displayed) Labs Reviewed - No data to display  EKG   Radiology DG Chest 2 View Result Date: 05/08/2024 CLINICAL DATA:  Cough for 2 weeks EXAM: CHEST - 2 VIEW COMPARISON:  12/14/2023 FINDINGS: Frontal and lateral views of the chest demonstrate mild enlargement of the cardiac silhouette, which may be due to AP projection. No airspace disease, effusion, or pneumothorax. No acute bony abnormalities. IMPRESSION: 1. Stable exam allowing for differences in technique. No acute process. Electronically Signed   By: Ozell Daring M.D.   On: 05/08/2024 17:39    Procedures Procedures (including critical care time)  Medications Ordered in UC Medications - No data to display  Initial Impression / Assessment and Plan / UC Course  I have reviewed the triage vital signs and the nursing notes.  Pertinent labs & imaging results that were available during my care of the patient were reviewed by me and considered in my medical decision making (see chart for details).     MDM: 1.  Acute URI-Rx'd doxycycline  100 mg capsule: Take 1 capsule twice daily x 7 days; 2.  Cough, unspecified type-CXR revealed above, Rx'd prednisone  20 mg tablet: Take 3 tabs p.o. daily x 5 days,  Rx'd Promethazine  DM-6.25-15 mg / 5 mL syrup: Take 5 mL twice daily, as needed for cough. Advised patient/husband take medication as directed with food to completion.  Advised take prednisone  first dose of doxycycline  for the next 5 of 7 days.  Advised may use Promethazine  DM for cough at night prior to sleep due to sedative effects.  Encouraged increase daily water intake to 64 ounces per day while taking these medications.  Advised if symptoms worsen and/or unresolved please  follow-up with PCP or here for further evaluation.  Patient discharged home, hemodynamically stable. Final Clinical Impressions(s) / UC Diagnoses   Final diagnoses:  Cough, unspecified type  Acute URI     Discharge Instructions      Advised patient/husband take medication as directed with food to completion.  Advised take prednisone  first dose of doxycycline  for the next 5 of 7 days.  Advised may use Promethazine  DM for cough at night prior to sleep due to sedative effects.  Encouraged increase daily water intake to 64 ounces per day while taking these medications.  Advised if symptoms worsen and/or unresolved please follow-up with PCP or here for further evaluation.     ED Prescriptions     Medication Sig Dispense Auth. Provider   doxycycline  (VIBRAMYCIN ) 100 MG capsule Take 1 capsule (100 mg total) by mouth 2 (two) times daily for 7 days. 14 capsule Ledonna Dormer, FNP   predniSONE  (DELTASONE ) 20 MG tablet Take 3 tabs PO daily x 5 days. 15 tablet Garon Melander, FNP   promethazine -dextromethorphan (PROMETHAZINE -DM) 6.25-15 MG/5ML syrup Take 5 mLs by mouth 2 (two) times daily as needed for cough. 118 mL Murle Hellstrom, FNP      PDMP not reviewed this encounter.    [1]  Social History Tobacco Use   Smoking status: Never   Smokeless tobacco: Never  Vaping Use   Vaping status: Never Used  Substance Use Topics   Alcohol use: Never   Drug use: Never     Teddy Sharper, FNP 05/08/24 1749  "

## 2024-05-08 NOTE — Therapy (Signed)
 " OUTPATIENT PHYSICAL THERAPY NEURO EVALUATION   Patient Name: Joann Maddox MRN: [id number] DOB:Feb 21, 1941, 84 y.o., female Today's Date: 05/08/2024   PCP: Dorothyann Byars, MD REFERRING PROVIDER: Lonell Sprang, DO  END OF SESSION:  PT End of Session - 05/08/24 1513     Visit Number 1    Number of Visits 16    Date for Recertification  07/07/24    Authorization Type [employer] medicare    Progress Note Due on Visit 10    PT Start Time 1415    PT Stop Time 1500    PT Time Calculation (min) 45 min    Activity Tolerance Patient tolerated treatment well    Behavior During Therapy [facility] for tasks assessed/performed          Past Medical History:  Diagnosis Date   Allergy    Anxiety    Arthritis    Cataract    Depression    GERD (gastroesophageal reflux disease)    Headache(784.0)    migrains   Heart murmur    Hypothyroidism    Osteoporosis 08/12/2023   DEXA 4/25: t score of    Pre-diabetes    Rheumatoid arthritis(714.0)    Past Surgical History:  Procedure Laterality Date   ABDOMINAL HYSTERECTOMY     ANTERIOR CERVICAL DECOMP/DISCECTOMY FUSION N/A 01/03/2018   Procedure: ANTERIOR CERVICAL DECOMPRESSION/DISCECTOMY FUSION, ALLOGRAFT, PLATE;  Surgeon: Barbarann Oneil BROCKS, MD;  Location: [facility] OR;  Service: Orthopedics;  Laterality: N/A;   APPENDECTOMY     CHOLECYSTECTOMY     JOINT REPLACEMENT     Both hips and both knees   REPLACEMENT TOTAL KNEE BILATERAL     SPINE SURGERY     TONSILLECTOMY     as  a child   TOTAL HIP ARTHROPLASTY     right and left   Patient Active Problem List   Diagnosis Date Noted   Osteoporosis 08/12/2023   Excessive sleepiness 04/01/2023   Unsteady gait 04/03/2022   Bilateral hearing loss 03/12/2022   Urinary incontinence 12/25/2021   Mild dementia (HCC) 06/24/2021   Coccydynia 12/23/2020   Gastroesophageal reflux disease with esophagitis without hemorrhage 04/05/2020   Iron deficiency anemia 06/22/2019   Lymphatic edema 12/06/2018   Sleep  disturbance 12/05/2018   Venous stasis dermatitis of both lower extremities 09/20/2018   Varicose veins of both lower extremities 08/22/2018   Depression, major, single episode, mild 08/22/2018   Falls frequently 08/12/2018   Mass of thigh, right 05/17/2018   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   Status post cervical spinal fusion 01/11/2018   Chronic neck pain 10/20/2017   Chronic venous insufficiency 04/29/2017   IFG (impaired fasting glucose) 05/09/2014   Chronic constipation 02/19/2014   Rosacea 11/06/2013   GAD (generalized anxiety disorder) 06/08/2013   Essential hypertension, benign 05/04/2013   Migraine with aura 05/04/2013   Murmur, heart 05/04/2013   Hyperlipidemia 05/04/2013   Subclinical hyperthyroidism 05/04/2013   Fatigue 02/16/2012   Mixed stress and urge urinary incontinence 02/16/2012   Hyperglycemia 01/12/2012    ONSET DATE: 04/28/24  REFERRING DIAG:  M25.561,M25.562,G89.29 (ICD-10-CM) - Chronic pain of both knees  M62.81 (ICD-10-CM) - Weakness of both quadriceps muscles  Z96.653 (ICD-10-CM) - History of knee replacement, total, bilateral  R26.81 (ICD-10-CM) - Unsteady gait    THERAPY DIAG:  Muscle weakness (generalized)  Chronic pain of right knee  Chronic pain of left knee  Rationale for Evaluation and Treatment: Rehabilitation  SUBJECTIVE:  SUBJECTIVE STATEMENT: The patient has had worsened knee pain, walking, and weakness. She has some L LE swelling-- ruled out blood clot. She has had some respiratory illness with increased coughing (in the past 2 weeks).  Pt accompanied by: family member  PERTINENT HISTORY: arthritis, h/o bilat TKR, bilat THR, osteoporosis, incontinence, mild dementia  PAIN:  Are you having pain? Yes: NPRS scale: not able to  rate-- c/o pain regularly at home Pain location: bilateral kne pain Pain description: aching, weakness Aggravating factors: hard to get up Relieving factors: rest  PRECAUTIONS: Fall  RED FLAGS: None   WEIGHT BEARING RESTRICTIONS: No  FALLS: Has patient fallen in last 6 months? Yes. Number of falls 3 x in the home  LIVING ENVIRONMENT: Lives with: lives with their spouse Lives in: House/apartment Stairs: No Has following equipment at home: Single point cane, Wheelchair (power), shower chair, and 3 wheeled walker  PLOF: Independent  PATIENT GOALS: Be able to get up out of a chair and walk better.   OBJECTIVE:  Note: Objective measures were completed at Evaluation unless otherwise noted.  COGNITION: Overall cognitive status: History of cognitive impairments - at baseline   SENSATION: WFL  EDEMA:  Notes some mild edema L LE-- not observed during eval today  POSTURE: patient maintains trunk flexion in standing  LOWER EXTREMITY ROM:   some tightness observed in hip flexors, trunk extension and gastoc per standing positioning.  LOWER EXTREMITY MMT:   MMT Right Eval Left Eval  Hip flexion 4/5 4/5  Hip extension    Knee flexion 5/5 5/5  Knee extension 5/5 5/5  Ankle dorsiflexion 3/5 4/5  Ankle plantarflexion    (Blank rows = not tested)  TRANSFERS: Sit to stand: SBA  Assistive device utilized: 3 wheeled walker     Stand to sit: SBA  Assistive device utilized: uses legs against back of chair      GAIT: Findings: Gait Characteristics: decreased stride length, shuffling, decreased trunk rotation, trunk flexed, poor foot clearance- Right, and poor foot clearance- Left, Distance walked: 60 feet, Assistive device utilized:3 wheeeled walker, Level of assistance: SBA, and Comments: 18 ft/18.05 seconds  FUNCTIONAL TESTS:  5 times sit to stand: 21.41 seconds Timed up and go (TUG): 34.1 seconds with RW    [facility] PT Assessment - 05/08/24 1430       Standardized Balance  Assessment   Standardized Balance Assessment Berg Balance Test;Timed Up and Go Test;10 meter walk test      Berg Balance Test   Sit to Stand Able to stand using hands after several tries    Standing Unsupported Able to stand 2 minutes with supervision    Sitting with Back Unsupported but Feet Supported on Floor or Stool Able to sit safely and securely 2 minutes    Stand to Sit Controls descent by using hands    Transfers Able to transfer with verbal cueing and /or supervision    Standing Unsupported with Eyes Closed Able to stand 10 seconds with supervision    Standing Unsupported with Feet Together Able to place feet together independently and stand for 1 minute with supervision    From Standing, Reach Forward with Outstretched Arm Reaches forward but needs supervision    From Standing Position, Pick up Object from Floor Unable to pick up and needs supervision    From Standing Position, Turn to Look Behind Over each Shoulder Needs supervision when turning    Turn 360 Degrees Needs assistance while turning    Standing Unsupported,  Alternately Place Feet on Step/Stool Needs assistance to keep from falling or unable to try    Standing Unsupported, One Foot in [employer] balance while stepping or standing    Standing on One Leg Unable to try or needs assist to prevent fall    Total Score 23    Berg comment: 23/56          PATIENT SURVEYS:  N/a due to cognition    [facility] Adult PT Treatment:                                                DATE: 05/08/24 Therapeutic Activity: Sit<>stand x 5 reps with UE support Standing heel raises at the countertop x 10 reps Wall bumps x 10 reps with walker anteriorly positioned Discussed/reviewed home safety and having spouse or caregiver present Adjusted height of the walker  PATIENT EDUCATION: Education details: HEP, goals of current plan, home safety, walker height Person educated: Patient Education method: Explanation, Demonstration, and  Handouts Education comprehension: verbalized understanding, returned demonstration, and needs further education  HOME EXERCISE PROGRAM: Access Code: [email or web address] URL: [email or web address] Date: 05/08/2024 Prepared by: Tawni Ferrier  Exercises - Sit to Stand with Armchair  - 1 x daily - 7 x weekly - 2 sets - 5 reps - Heel Raises with Counter Support  - 1 x daily - 7 x weekly - 1 sets - 10 reps - Standing Anterior Posterior Weight Shift with Chair  - 1 x daily - 7 x weekly - 1 sets - 10 reps  GOALS: Goals reviewed with patient? Yes  SHORT TERM GOALS: Target date: 06/08/24  The patient will be indep with initial HEP Baseline: initiated at eval Goal status: INITIAL  2.  The patient will improve Berg score to > or equal to 28/56.  Baseline:  23/56 Goal status: INITIAL  3.  The patient/caregiver will verbalize being able to move sit>stand from loveseat at home without difficulty. Baseline:  Notes unable to rise from loveseat at home Goal status: INITIAL  LONG TERM GOALS: Target date: 07/07/24  The patient will be indep with progression of HEP. Baseline:  initiated at eval Goal status: INITIAL  2.  The patient will improve 5 time sit<>stand to < or equal to 17 seconds to demonstrate improved functional abilities. Baseline: 21.41 seconds Goal status: INITIAL  3.  The patient will improve gait speed to > or equal to 1.4 ft/sec to demo improving mobility for home. Baseline:  1 ft/sec Goal status: INITIAL  4.  The patient will improve Berg score to > or equal to 30/56. Baseline:  23/56 Goal status: INITIAL  5.  Reduce TUG to < or equal to 28 seconds. Baseline:  34.1 seconds Goal status: INITIAL  ASSESSMENT:  CLINICAL IMPRESSION: Patient is an 84 y.o. female who was seen today for physical therapy evaluation and treatment for muscle weakness, unsteady gait and knee pain. She presents with impairments in bilat LE strength, posture (remains forward flexed in  standing), balance, gait abnormality, slowed gait speed, pain in both knees, and dec'd functional mobility. PT to address deficits to improve household mobility and safety.    OBJECTIVE IMPAIRMENTS: Abnormal gait, decreased activity tolerance, decreased balance, decreased strength, impaired flexibility, and pain.   ACTIVITY LIMITATIONS: standing, transfers, bathing, dressing, and locomotion level  PARTICIPATION LIMITATIONS: meal prep, cleaning, community activity, and household ADLs  and IADLs  PERSONAL FACTORS: 3+ comorbidities: osteoporosis, bilat TKR, mild dementia are also affecting patient's functional outcome.   REHAB POTENTIAL: Good  CLINICAL DECISION MAKING: Evolving/moderate complexity  EVALUATION COMPLEXITY: Moderate  PLAN:  PT FREQUENCY: 2x/week  PT DURATION: 8 weeks  PLANNED INTERVENTIONS: [id number]- PT Re-evaluation, 97750- Physical Performance Testing, 97110-Therapeutic exercises, 97530- Therapeutic activity, 97112- Neuromuscular re-education, 97535- Self Care, [id number]- Manual therapy, [phone]- Gait training, Patient/Family education, and Balance training  PLAN FOR NEXT SESSION: Standing balance/posture/weight shifting; adjust height to lower 3 wheeled walker further (adjusted down 2, but needs to go further-- plan to slowly lower it), LE strength training, gait training, functional training.   Conor Lata, PT 05/08/2024, 3:14 PM        "

## 2024-05-10 ENCOUNTER — Ambulatory Visit: Admitting: Physical Therapy

## 2024-05-10 ENCOUNTER — Encounter: Payer: Self-pay | Admitting: Physical Therapy

## 2024-05-10 DIAGNOSIS — G8929 Other chronic pain: Secondary | ICD-10-CM

## 2024-05-10 DIAGNOSIS — M6281 Muscle weakness (generalized): Secondary | ICD-10-CM

## 2024-05-10 NOTE — Therapy (Signed)
 " OUTPATIENT PHYSICAL THERAPY NEURO EVALUATION   Patient Name: Joann Maddox MRN: [id number] DOB:[birth year], 84 y.o., female Today's Date: 05/10/2024   PCP: Dorothyann Byars, MD REFERRING PROVIDER: Lonell Sprang, DO  END OF SESSION:  PT End of Session - 05/10/24 1231     Visit Number 2    Number of Visits 16    Date for Recertification  07/07/24    Authorization Type [employer] medicare    Authorization - Visit Number 1    Progress Note Due on Visit 10    PT Start Time 1148    PT Stop Time 1230    PT Time Calculation (min) 42 min    Equipment Utilized During Treatment Gait belt    Activity Tolerance Patient tolerated treatment well    Behavior During Therapy WFL for tasks assessed/performed           Past Medical History:  Diagnosis Date   Allergy    Anxiety    Arthritis    Cataract    Depression    GERD (gastroesophageal reflux disease)    Headache(784.0)    migrains   Heart murmur    Hypothyroidism    Osteoporosis 08/12/2023   DEXA 4/25: t score of    Pre-diabetes    Rheumatoid arthritis(714.0)    Past Surgical History:  Procedure Laterality Date   ABDOMINAL HYSTERECTOMY     ANTERIOR CERVICAL DECOMP/DISCECTOMY FUSION N/A 01/03/2018   Procedure: ANTERIOR CERVICAL DECOMPRESSION/DISCECTOMY FUSION, ALLOGRAFT, PLATE;  Surgeon: Barbarann Oneil BROCKS, MD;  Location: [facility] OR;  Service: Orthopedics;  Laterality: N/A;   APPENDECTOMY     CHOLECYSTECTOMY     JOINT REPLACEMENT     Both hips and both knees   REPLACEMENT TOTAL KNEE BILATERAL     SPINE SURGERY     TONSILLECTOMY     as  a child   TOTAL HIP ARTHROPLASTY     right and left   Patient Active Problem List   Diagnosis Date Noted   Osteoporosis 08/12/2023   Excessive sleepiness 04/01/2023   Unsteady gait 04/03/2022   Bilateral hearing loss 03/12/2022   Urinary incontinence 12/25/2021   Mild dementia (HCC) 06/24/2021   Coccydynia 12/23/2020   Gastroesophageal reflux disease with esophagitis without hemorrhage  04/05/2020   Iron deficiency anemia 06/22/2019   Lymphatic edema 12/06/2018   Sleep disturbance 12/05/2018   Venous stasis dermatitis of both lower extremities 09/20/2018   Varicose veins of both lower extremities 08/22/2018   Depression, major, single episode, mild 08/22/2018   Falls frequently 08/12/2018   Mass of thigh, right 05/17/2018   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   Status post cervical spinal fusion 01/11/2018   Chronic neck pain 10/20/2017   Chronic venous insufficiency 04/29/2017   IFG (impaired fasting glucose) 05/09/2014   Chronic constipation 02/19/2014   Rosacea 11/06/2013   GAD (generalized anxiety disorder) 06/08/2013   Essential hypertension, benign 05/04/2013   Migraine with aura 05/04/2013   Murmur, heart 05/04/2013   Hyperlipidemia 05/04/2013   Subclinical hyperthyroidism 05/04/2013   Fatigue 02/16/2012   Mixed stress and urge urinary incontinence 02/16/2012   Hyperglycemia 01/12/2012    ONSET DATE: 04/28/24  REFERRING DIAG:  M25.561,M25.562,G89.29 (ICD-10-CM) - Chronic pain of both knees  M62.81 (ICD-10-CM) - Weakness of both quadriceps muscles  Z96.653 (ICD-10-CM) - History of knee replacement, total, bilateral  R26.81 (ICD-10-CM) - Unsteady gait    THERAPY DIAG:  Muscle weakness (generalized)  Chronic pain of right knee  Chronic pain of left knee  Rationale for Evaluation and Treatment: Rehabilitation  SUBJECTIVE:                                                                                                                                                                                             SUBJECTIVE STATEMENT: Pt with no new complaints since last visit. She states she did some exercises this morning Pt accompanied by: family member  PERTINENT HISTORY: arthritis, h/o bilat TKR, bilat THR, osteoporosis, incontinence, mild dementia The patient has had worsened knee pain, walking, and weakness. She has some L LE  swelling-- ruled out blood clot. She has had some respiratory illness with increased coughing (in the past 2 weeks).  PAIN:  Are you having pain? Yes: NPRS scale: not able to rate-- c/o pain regularly at home Pain location: bilateral kne pain Pain description: aching, weakness Aggravating factors: hard to get up Relieving factors: rest  PRECAUTIONS: Fall  RED FLAGS: None   WEIGHT BEARING RESTRICTIONS: No  FALLS: Has patient fallen in last 6 months? Yes. Number of falls 3 x in the home  LIVING ENVIRONMENT: Lives with: lives with their spouse Lives in: House/apartment Stairs: No Has following equipment at home: Single point cane, Wheelchair (power), shower chair, and 3 wheeled walker  PLOF: Independent  PATIENT GOALS: Be able to get up out of a chair and walk better.   OBJECTIVE:  Note: Objective measures were completed at Evaluation unless otherwise noted.  COGNITION: Overall cognitive status: History of cognitive impairments - at baseline   SENSATION: WFL  EDEMA:  Notes some mild edema L LE-- not observed during eval today  POSTURE: patient maintains trunk flexion in standing  LOWER EXTREMITY ROM:   some tightness observed in hip flexors, trunk extension and gastoc per standing positioning.  LOWER EXTREMITY MMT:   MMT Right Eval Left Eval  Hip flexion 4/5 4/5  Hip extension    Knee flexion 5/5 5/5  Knee extension 5/5 5/5  Ankle dorsiflexion 3/5 4/5  Ankle plantarflexion    (Blank rows = not tested)  TRANSFERS: Sit to stand: SBA  Assistive device utilized: 3 wheeled walker     Stand to sit: SBA  Assistive device utilized: uses legs against back of chair      GAIT: Findings: Gait Characteristics: decreased stride length, shuffling, decreased trunk rotation, trunk flexed, poor foot clearance- Right, and poor foot clearance- Left, Distance walked: 60 feet, Assistive device utilized:3 wheeeled walker, Level of assistance: SBA, and Comments: 18 ft/18.05  seconds  FUNCTIONAL TESTS:  5 times sit to stand: 21.41 seconds Timed up and go (TUG): 34.1 seconds with RW  PATIENT SURVEYS:  N/a due to cognition  [facility] Adult PT Treatment:                                                DATE: 05/10/24 Therapeutic Exercise: Seated LAQ red TB x 10 Seated HS curl red TB x 10 Seated hip abd 2 x 10 red TB Seated adduction ball squeeze 10 x 3 sec hold  Neuromuscular re-ed: Standing wt shifts lateral, diagonal all with HHA, min A Standing alternating tap ups 4'' step, HHA, Mod A Reaching hi/low for cones mod A Tapping colored dots all directions in 1,2,3 sequence patterns - mod A for balance, good cognition Therapeutic Activity: Sit <> stand 2 x 5 for warm up - min A     [facility] Adult PT Treatment:                                                DATE: 05/08/24 Therapeutic Activity: Sit<>stand x 5 reps with UE support Standing heel raises at the countertop x 10 reps Wall bumps x 10 reps with walker anteriorly positioned Discussed/reviewed home safety and having spouse or caregiver present Adjusted height of the walker  PATIENT EDUCATION: Education details: HEP, goals of current plan, home safety, walker height Person educated: Patient Education method: Explanation, Demonstration, and Handouts Education comprehension: verbalized understanding, returned demonstration, and needs further education  HOME EXERCISE PROGRAM: Access Code: [email or web address] URL: [email or web address] Date: 05/10/2024 Prepared by: Darice Conine  Exercises - Sit to Stand with Armchair  - 1 x daily - 7 x weekly - 2 sets - 5 reps - Heel Raises with Counter Support  - 1 x daily - 7 x weekly - 1 sets - 10 reps - Standing Anterior Posterior Weight Shift with Chair  - 1 x daily - 7 x weekly - 2 sets - 10 reps - Side to Side Weight Shift with Counter Support  - 1 x daily - 7 x weekly - 2 sets - 10 reps - Tap up  - 1 x daily - 7 x weekly - 2 sets - 10  reps  GOALS: Goals reviewed with patient? Yes  SHORT TERM GOALS: Target date: 06/08/24  The patient will be indep with initial HEP Baseline: initiated at eval Goal status: INITIAL  2.  The patient will improve Berg score to > or equal to 28/56.  Baseline:  23/56 Goal status: INITIAL  3.  The patient/caregiver will verbalize being able to move sit>stand from loveseat at home without difficulty. Baseline:  Notes unable to rise from loveseat at home Goal status: INITIAL  LONG TERM GOALS: Target date: 07/07/24  The patient will be indep with progression of HEP. Baseline:  initiated at eval Goal status: INITIAL  2.  The patient will improve 5 time sit<>stand to < or equal to 17 seconds to demonstrate improved functional abilities. Baseline: 21.41 seconds Goal status: INITIAL  3.  The patient will improve gait speed to > or equal to 1.4 ft/sec to demo improving mobility for home. Baseline:  1 ft/sec Goal status: INITIAL  4.  The patient will improve Berg score to > or equal to 30/56. Baseline:  23/56 Goal status: INITIAL  5.  Reduce TUG to <  or equal to 28 seconds. Baseline:  34.1 seconds Goal status: INITIAL  ASSESSMENT:  CLINICAL IMPRESSION: Session focused on balance training and knee and hip strength. Pt with good tolerance to balance tasks, requires mod A for tapping, min A for reaching and wt shifts. Updated HEP to add lateral wt shifts and tap ups with pt's spouse aware of need for assistance with this exercises  OBJECTIVE IMPAIRMENTS: Abnormal gait, decreased activity tolerance, decreased balance, decreased strength, impaired flexibility, and pain.   ACTIVITY LIMITATIONS: standing, transfers, bathing, dressing, and locomotion level  PARTICIPATION LIMITATIONS: meal prep, cleaning, community activity, and household ADLs and IADLs  PERSONAL FACTORS: 3+ comorbidities: osteoporosis, bilat TKR, mild dementia are also affecting patient's functional outcome.   REHAB  POTENTIAL: Good  CLINICAL DECISION MAKING: Evolving/moderate complexity  EVALUATION COMPLEXITY: Moderate  PLAN:  PT FREQUENCY: 2x/week  PT DURATION: 8 weeks  PLANNED INTERVENTIONS: 97164- PT Re-evaluation, 97750- Physical Performance Testing, 97110-Therapeutic exercises, 97530- Therapeutic activity, 97112- Neuromuscular re-education, 97535- Self Care, 02859- Manual therapy, [phone]- Gait training, Patient/Family education, and Balance training  PLAN FOR NEXT SESSION: Standing balance/posture/weight shifting; adjust height to lower 3 wheeled walker further (adjusted down 2, but needs to go further-- plan to slowly lower it), LE strength training, gait training, functional training.   Kierstynn Babich, PT 05/10/2024, 12:31 PM        "

## 2024-05-15 ENCOUNTER — Other Ambulatory Visit: Payer: Self-pay

## 2024-05-15 ENCOUNTER — Other Ambulatory Visit: Payer: Self-pay | Admitting: Podiatry

## 2024-05-15 ENCOUNTER — Ambulatory Visit: Admitting: Rehabilitative and Restorative Service Providers"

## 2024-05-15 ENCOUNTER — Ambulatory Visit (INDEPENDENT_AMBULATORY_CARE_PROVIDER_SITE_OTHER): Admitting: Podiatry

## 2024-05-15 ENCOUNTER — Encounter: Payer: Self-pay | Admitting: Rehabilitative and Restorative Service Providers"

## 2024-05-15 ENCOUNTER — Ambulatory Visit

## 2024-05-15 DIAGNOSIS — M79671 Pain in right foot: Secondary | ICD-10-CM

## 2024-05-15 DIAGNOSIS — M205X2 Other deformities of toe(s) (acquired), left foot: Secondary | ICD-10-CM | POA: Diagnosis not present

## 2024-05-15 DIAGNOSIS — M6281 Muscle weakness (generalized): Secondary | ICD-10-CM | POA: Diagnosis not present

## 2024-05-15 DIAGNOSIS — G8929 Other chronic pain: Secondary | ICD-10-CM

## 2024-05-15 DIAGNOSIS — L84 Corns and callosities: Secondary | ICD-10-CM | POA: Diagnosis not present

## 2024-05-15 DIAGNOSIS — M7751 Other enthesopathy of right foot: Secondary | ICD-10-CM

## 2024-05-15 NOTE — Progress Notes (Signed)
"  °  Subjective:  Patient ID: Joann Maddox, female    DOB: 1940/08/27,   MRN: [id number]  Chief Complaint  Patient presents with   Callouses    Callous on right 5th sub met. Has had for years.  Not diabetic. No anti coag   Toe Pain    Left large toe is overlapping second toe. Pt reports minimal pain when walking with shoes.      84 y.o. female presents for concern of right foot callus that she has had for many years that has been building up and hoping to have trimmed down today.  Relates has been painful.  She also relates concern for some overlapping of her left great toe on the second and wondering about this postsurgery.. Denies any other pedal complaints. Denies n/v/f/c.   Past Medical History:  Diagnosis Date   Allergy    Anxiety    Arthritis    Cataract    Depression    GERD (gastroesophageal reflux disease)    Headache(784.0)    migrains   Heart murmur    Hypothyroidism    Osteoporosis 08/12/2023   DEXA 4/25: t score of    Pre-diabetes    Rheumatoid arthritis(714.0)     Objective:  Physical Exam: Vascular: DP/PT pulses 2/4 bilateral. CFT <3 seconds. Normal hair growth on digits. No edema.  Skin. No lacerations or abrasions bilateral feet.  Hyperkeratotic lesion noted plantar fifth metatarsal head on right Musculoskeletal: MMT 5/5 bilateral lower extremities in DF, PF, Inversion and Eversion. Deceased ROM in DF of ankle joint.  Noted plantarflexion of the right fifth metatarsal head.  Left foot limited range of motion of first MPJ with some abduction noted of the hallux.  Minimal pain today with palpation and range of motion of the first MPJ. Neurological: Sensation intact to light touch.   Assessment:   1. Hallux limitus, left   2. Capsulitis of metatarsophalangeal (MTP) joint of right foot   3. Callus      Plan:  Patient was evaluated and treated and all questions answered. -Xrays reviewed.  No acute fractures or dislocations.  Noted healing of previous digit  fractures from a year ago.  Mild plantarflexion of fifth metatarsal head on right -Discussed hallux limitus and  treatement options; conservative and  Surgical management; risks, benefits, alternatives discussed. All patient's questions answered. -Patient is 1 year postop left foot cheilectomy and doing well. - Movement of the great toe and not causing any significant issues currently will continue to monitor and discussed further treatment options in the future if needed. -Discussed benign skin lesions with patient and treatment options.  -Hyperkeratotic tissue was debrided with chisel without incident.  -Encouraged daily moisturizing -Discussed use of pumice stone -Advised good supportive shoes and inserts -Patient to return to office as needed or sooner if condition worsens.    Asberry Failing, DPM    "

## 2024-05-15 NOTE — Therapy (Signed)
 " OUTPATIENT PHYSICAL THERAPY NEURO TREATMENT   Patient Name: Joann Maddox MRN: [id number] DOB:1940/06/10, 84 y.o., female Today's Date: 05/15/2024   PCP: Dorothyann Byars, MD REFERRING PROVIDER: Lonell Sprang, DO  END OF SESSION:  PT End of Session - 05/15/24 1407     Visit Number 3    Number of Visits 16    Date for Recertification  07/07/24    Authorization Type [employer]BS medicare    Progress Note Due on Visit 10    PT Start Time 1407    PT Stop Time 1447    PT Time Calculation (min) 40 min    Equipment Utilized During Treatment Gait belt    Activity Tolerance Patient tolerated treatment well    Behavior During Therapy WFL for tasks assessed/performed          Past Medical History:  Diagnosis Date   Allergy    Anxiety    Arthritis    Cataract    Depression    GERD (gastroesophageal reflux disease)    Headache(784.0)    migrains   Heart murmur    Hypothyroidism    Osteoporosis 08/12/2023   DEXA 4/25: t score of    Pre-diabetes    Rheumatoid arthritis(714.0)    Past Surgical History:  Procedure Laterality Date   ABDOMINAL HYSTERECTOMY     ANTERIOR CERVICAL DECOMP/DISCECTOMY FUSION N/A 01/03/2018   Procedure: ANTERIOR CERVICAL DECOMPRESSION/DISCECTOMY FUSION, ALLOGRAFT, PLATE;  Surgeon: Barbarann Oneil BROCKS, MD;  Location: [facility] OR;  Service: Orthopedics;  Laterality: N/A;   APPENDECTOMY     CHOLECYSTECTOMY     JOINT REPLACEMENT     Both hips and both knees   REPLACEMENT TOTAL KNEE BILATERAL     SPINE SURGERY     TONSILLECTOMY     as  a child   TOTAL HIP ARTHROPLASTY     right and left   Patient Active Problem List   Diagnosis Date Noted   Osteoporosis 08/12/2023   Excessive sleepiness 04/01/2023   Unsteady gait 04/03/2022   Bilateral hearing loss 03/12/2022   Urinary incontinence 12/25/2021   Mild dementia (HCC) 06/24/2021   Coccydynia 12/23/2020   Gastroesophageal reflux disease with esophagitis without hemorrhage 04/05/2020   Iron deficiency anemia  06/22/2019   Lymphatic edema 12/06/2018   Sleep disturbance 12/05/2018   Venous stasis dermatitis of both lower extremities 09/20/2018   Varicose veins of both lower extremities 08/22/2018   Depression, major, single episode, mild 08/22/2018   Falls frequently 08/12/2018   Mass of thigh, right 05/17/2018   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   Status post cervical spinal fusion 01/11/2018   Chronic neck pain 10/20/2017   Chronic venous insufficiency 04/29/2017   IFG (impaired fasting glucose) 05/09/2014   Chronic constipation 02/19/2014   Rosacea 11/06/2013   GAD (generalized anxiety disorder) 06/08/2013   Essential hypertension, benign 05/04/2013   Migraine with aura 05/04/2013   Murmur, heart 05/04/2013   Hyperlipidemia 05/04/2013   Subclinical hyperthyroidism 05/04/2013   Fatigue 02/16/2012   Mixed stress and urge urinary incontinence 02/16/2012   Hyperglycemia 01/12/2012    ONSET DATE: 04/28/24  REFERRING DIAG:  M25.561,M25.562,G89.29 (ICD-10-CM) - Chronic pain of both knees  M62.81 (ICD-10-CM) - Weakness of both quadriceps muscles  Z96.653 (ICD-10-CM) - History of knee replacement, total, bilateral  R26.81 (ICD-10-CM) - Unsteady gait    THERAPY DIAG:  Muscle weakness (generalized)  Chronic pain of right knee  Chronic pain of left knee  Rationale for Evaluation and Treatment: Rehabilitation  SUBJECTIVE:  SUBJECTIVE STATEMENT: Pt's husband notes he hasn't been able to help for HEP -- he has had pneumonia in the L lobe and has felt bad.  Pt accompanied by: family member  PERTINENT HISTORY: arthritis, h/o bilat TKR, bilat THR, osteoporosis, incontinence, mild dementia The patient has had worsened knee pain, walking, and weakness. She has some L LE swelling-- ruled out  blood clot. She has had some respiratory illness with increased coughing (in the past 2 weeks).  PAIN:  Are you having pain? Yes: NPRS scale: not able to rate-- c/o pain regularly at home Pain location: bilateral kne pain Pain description: aching, weakness Aggravating factors: hard to get up Relieving factors: rest  PRECAUTIONS: Fall   WEIGHT BEARING RESTRICTIONS: No  FALLS: Has patient fallen in last 6 months? Yes. Number of falls 3 x in the home  LIVING ENVIRONMENT: Lives with: lives with their spouse Lives in: House/apartment Stairs: No Has following equipment at home: Single point cane, Wheelchair (power), shower chair, and 3 wheeled walker  PLOF: Independent  PATIENT GOALS: Be able to get up out of a chair and walk better.   OBJECTIVE:  Note: Objective measures were completed at Evaluation unless otherwise noted.  COGNITION: Overall cognitive status: History of cognitive impairments - at baseline   SENSATION: WFL  EDEMA:  Notes some mild edema L LE-- not observed during eval today  POSTURE: patient maintains trunk flexion in standing  LOWER EXTREMITY ROM:   some tightness observed in hip flexors, trunk extension and gastoc per standing positioning.  LOWER EXTREMITY MMT:   MMT Right Eval Left Eval  Hip flexion 4/5 4/5  Hip extension    Knee flexion 5/5 5/5  Knee extension 5/5 5/5  Ankle dorsiflexion 3/5 4/5  Ankle plantarflexion    (Blank rows = not tested)  TRANSFERS: Sit to stand: SBA  Assistive device utilized: 3 wheeled walker     Stand to sit: SBA  Assistive device utilized: uses legs against back of chair      GAIT: Findings: Gait Characteristics: decreased stride length, shuffling, decreased trunk rotation, trunk flexed, poor foot clearance- Right, and poor foot clearance- Left, Distance walked: 60 feet, Assistive device utilized:3 wheeeled walker, Level of assistance: SBA, and Comments: 18 ft/18.05 seconds  FUNCTIONAL TESTS:  5 times sit to  stand: 21.41 seconds Timed up and go (TUG): 34.1 seconds with RW   PATIENT SURVEYS:  N/a due to cognition   [facility] Adult PT Treatment:                                                DATE: 05/15/24 Therapeutic Exercise: Seated LAQ x 10 reps R and L  Red theraband with LAQ and knee flexion x 12 reps R and L each Ball squeeze 10 x 3 seconds hold Neuromuscular re-ed: Wall bumps x 10 reps with UE support and mod cues for demo and tactile to get hip strategy movement Standing in stride with wall slides of contralateral UE Therapeutic Activity: Wall slides x 10 reps  Standing hel raises Sit<>stand x 10 reps with cues for reducing LE leaning against mat table Gait: Gait x 60 feet x 3 sets with RW (clinic walker versus 3 wheeled rollator)   [facility] Adult PT Treatment:  DATE: 05/10/24 Therapeutic Exercise: Seated LAQ red TB x 10 Seated HS curl red TB x 10 Seated hip abd 2 x 10 red TB Seated adduction ball squeeze 10 x 3 sec hold Neuromuscular re-ed: Standing wt shifts lateral, diagonal all with HHA, min A Standing alternating tap ups 4'' step, HHA, Mod A Reaching hi/low for cones mod A Tapping colored dots all directions in 1,2,3 sequence patterns - mod A for balance, good cognition Therapeutic Activity: Sit <> stand 2 x 5 for warm up - min A     [facility] Adult PT Treatment:                                                DATE: 05/08/24 Therapeutic Activity: Sit<>stand x 5 reps with UE support Standing heel raises at the countertop x 10 reps Wall bumps x 10 reps with walker anteriorly positioned Discussed/reviewed home safety and having spouse or caregiver present Adjusted height of the walker  PATIENT EDUCATION: Education details: HEP, goals of current plan, home safety, walker height Person educated: Patient Education method: Explanation, Demonstration, and Handouts Education comprehension: verbalized understanding, returned demonstration,  and needs further education  HOME EXERCISE PROGRAM: Access Code: [email or web address] URL: [email or web address] Date: 05/10/2024 Prepared by: Darice Conine  Exercises - Sit to Stand with Armchair  - 1 x daily - 7 x weekly - 2 sets - 5 reps - Heel Raises with Counter Support  - 1 x daily - 7 x weekly - 1 sets - 10 reps - Standing Anterior Posterior Weight Shift with Chair  - 1 x daily - 7 x weekly - 2 sets - 10 reps - Side to Side Weight Shift with Counter Support  - 1 x daily - 7 x weekly - 2 sets - 10 reps - Tap up  - 1 x daily - 7 x weekly - 2 sets - 10 reps  GOALS: Goals reviewed with patient? Yes  SHORT TERM GOALS: Target date: 06/08/24  The patient will be indep with initial HEP Baseline: initiated at eval Goal status: INITIAL  2.  The patient will improve Berg score to > or equal to 28/56.  Baseline:  23/56 Goal status: INITIAL  3.  The patient/caregiver will verbalize being able to move sit>stand from loveseat at home without difficulty. Baseline:  Notes unable to rise from loveseat at home Goal status: INITIAL  LONG TERM GOALS: Target date: 07/07/24  The patient will be indep with progression of HEP. Baseline:  initiated at eval Goal status: INITIAL  2.  The patient will improve 5 time sit<>stand to < or equal to 17 seconds to demonstrate improved functional abilities. Baseline: 21.41 seconds Goal status: INITIAL  3.  The patient will improve gait speed to > or equal to 1.4 ft/sec to demo improving mobility for home. Baseline:  1 ft/sec Goal status: INITIAL  4.  The patient will improve Berg score to > or equal to 30/56. Baseline:  23/56 Goal status: INITIAL  5.  Reduce TUG to < or equal to 28 seconds. Baseline:  34.1 seconds Goal status: INITIAL  ASSESSMENT:  CLINICAL IMPRESSION: PT continuing to work on gait activities with postural cues for upright. The patient is not able to perform HEP on her own-- her husband has been sick and notes they  haven't been able to get to the exercises. She follows cues  in clinic well. PT to continue to progress gait, balance, strengthening.  OBJECTIVE IMPAIRMENTS: Abnormal gait, decreased activity tolerance, decreased balance, decreased strength, impaired flexibility, and pain.   PLAN:  PT FREQUENCY: 2x/week  PT DURATION: 8 weeks  PLANNED INTERVENTIONS: 97164- PT Re-evaluation, 97750- Physical Performance Testing, 97110-Therapeutic exercises, 97530- Therapeutic activity, 97112- Neuromuscular re-education, 97535- Self Care, 02859- Manual therapy, [phone]- Gait training, Patient/Family education, and Balance training  PLAN FOR NEXT SESSION: Standing balance/posture/weight shifting; adjust height to lower 3 wheeled walker further (adjusted down 2, but needs to go further-- plan to slowly lower it), LE strength training, gait training, functional training.    Ahmar Pickrell, PT 05/15/2024, 2:07 PM        "

## 2024-05-16 ENCOUNTER — Encounter: Payer: Self-pay | Admitting: Family Medicine

## 2024-05-16 NOTE — Telephone Encounter (Signed)
 Do think we could get her in tomorrow with someone or even double book with me if needed.

## 2024-05-17 ENCOUNTER — Ambulatory Visit: Admitting: Urgent Care

## 2024-05-17 ENCOUNTER — Ambulatory Visit

## 2024-05-17 VITALS — BP 111/66 | HR 64 | Ht <= 58 in | Wt 175.0 lb

## 2024-05-17 DIAGNOSIS — D367 Benign neoplasm of other specified sites: Secondary | ICD-10-CM

## 2024-05-17 NOTE — Telephone Encounter (Signed)
 Scheduled. I will call patient after 8 am.

## 2024-05-17 NOTE — Telephone Encounter (Signed)
 Patient's husband advised. He states the caregiver will bring her in today.

## 2024-05-17 NOTE — Patient Instructions (Signed)
 Please go to [address] and schedule an ultrasound of your left leg.

## 2024-05-17 NOTE — Progress Notes (Unsigned)
 "  Established Patient Office Visit  Subjective:  Patient ID: Joann Maddox, female    DOB: 09/25/1940  Age: 84 y.o. MRN: [id number]  Chief Complaint  Patient presents with   Knee Pain    Swelling just below kneecap x 5-6 days    HPI  Patient Active Problem List   Diagnosis Date Noted   Osteoporosis 08/12/2023   Excessive sleepiness 04/01/2023   Unsteady gait 04/03/2022   Bilateral hearing loss 03/12/2022   Urinary incontinence 12/25/2021   Mild dementia (HCC) 06/24/2021   Coccydynia 12/23/2020   Gastroesophageal reflux disease with esophagitis without hemorrhage 04/05/2020   Iron deficiency anemia 06/22/2019   Lymphatic edema 12/06/2018   Sleep disturbance 12/05/2018   Venous stasis dermatitis of both lower extremities 09/20/2018   Varicose veins of both lower extremities 08/22/2018   Depression, major, single episode, mild 08/22/2018   Falls frequently 08/12/2018   Mass of thigh, right 05/17/2018   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   Status post cervical spinal fusion 01/11/2018   Chronic neck pain 10/20/2017   Chronic venous insufficiency 04/29/2017   IFG (impaired fasting glucose) 05/09/2014   Chronic constipation 02/19/2014   Rosacea 11/06/2013   GAD (generalized anxiety disorder) 06/08/2013   Essential hypertension, benign 05/04/2013   Migraine with aura 05/04/2013   Murmur, heart 05/04/2013   Hyperlipidemia 05/04/2013   Subclinical hyperthyroidism 05/04/2013   Fatigue 02/16/2012   Mixed stress and urge urinary incontinence 02/16/2012   Hyperglycemia 01/12/2012   Past Medical History:  Diagnosis Date   Allergy    Anxiety    Arthritis    Cataract    Depression    GERD (gastroesophageal reflux disease)    Headache(784.0)    migrains   Heart murmur    Hypothyroidism    Osteoporosis 08/12/2023   DEXA 4/25: t score of    Pre-diabetes    Rheumatoid arthritis(714.0)    Past Surgical History:   Procedure Laterality Date   ABDOMINAL HYSTERECTOMY     ANTERIOR CERVICAL DECOMP/DISCECTOMY FUSION N/A 01/03/2018   Procedure: ANTERIOR CERVICAL DECOMPRESSION/DISCECTOMY FUSION, ALLOGRAFT, PLATE;  Surgeon: Barbarann Oneil BROCKS, MD;  Location: [facility] OR;  Service: Orthopedics;  Laterality: N/A;   APPENDECTOMY     CHOLECYSTECTOMY     JOINT REPLACEMENT     Both hips and both knees   REPLACEMENT TOTAL KNEE BILATERAL     SPINE SURGERY     TONSILLECTOMY     as  a child   TOTAL HIP ARTHROPLASTY     right and left   Social History[1]    ROS: as noted in HPI  Objective:     BP 111/66   Pulse 64   Ht 4' 10 (1.473 m)   Wt 175 lb (79.4 kg)   SpO2 98%   BMI 36.58 kg/m  BP Readings from Last 3 Encounters:  05/17/24 111/66  05/08/24 127/77  12/14/23 (!) 148/77   Wt Readings from Last 3 Encounters:  05/17/24 175 lb (79.4 kg)  05/08/24 165 lb (74.8 kg)  08/19/23 181 lb (82.1 kg)      Physical Exam   No results found for any visits on 05/17/24.  Last CBC Lab Results  Component Value Date   WBC 5.2 08/05/2023   HGB 11.6 08/05/2023   HCT 34.6 08/05/2023   MCV 90 08/05/2023   MCH 30.1 08/05/2023   RDW 12.9 08/05/2023   PLT 259 08/05/2023   Last lipids Lab Results  Component Value Date   CHOL  145 08/05/2023   HDL 78 08/05/2023   LDLCALC 55 08/05/2023   TRIG 54 08/05/2023   CHOLHDL 1.9 08/05/2023      The ASCVD Risk score (Arnett DK, et al., 2019) failed to calculate for the following reasons:   The 2019 ASCVD risk score is only valid for ages 68 to 51   * - Cholesterol units were assumed  Assessment & Plan:  There are no diagnoses linked to this encounter.   No follow-ups on file.   Benton LITTIE Gave, PA      [1] Social History Tobacco Use   Smoking status: Never   Smokeless tobacco: Never  Vaping Use   Vaping status: Never Used  Substance Use Topics   Alcohol use: Never   Drug use: Never  "

## 2024-05-18 ENCOUNTER — Encounter: Payer: Self-pay | Admitting: Rehabilitative and Restorative Service Providers"

## 2024-05-18 ENCOUNTER — Encounter: Payer: Self-pay | Admitting: Urgent Care

## 2024-05-18 ENCOUNTER — Ambulatory Visit: Admitting: Rehabilitative and Restorative Service Providers"

## 2024-05-18 DIAGNOSIS — G8929 Other chronic pain: Secondary | ICD-10-CM

## 2024-05-18 DIAGNOSIS — M6281 Muscle weakness (generalized): Secondary | ICD-10-CM

## 2024-05-18 NOTE — Therapy (Signed)
 " OUTPATIENT PHYSICAL THERAPY NEURO TREATMENT   Patient Name: Joann Maddox MRN: [id number] DOB:Feb 01, 1941, 84 y.o., female Today's Date: 05/18/2024   PCP: Dorothyann Byars, MD REFERRING PROVIDER: Lonell Sprang, DO  END OF SESSION:  PT End of Session - 05/18/24 1410     Visit Number 4    Number of Visits 16    Date for Recertification  07/07/24    Authorization Type [employer] medicare    Progress Note Due on Visit 10    PT Start Time 1409    PT Stop Time 1449    PT Time Calculation (min) 40 min    Equipment Utilized During Treatment Gait belt    Activity Tolerance Patient tolerated treatment well    Behavior During Therapy WFL for tasks assessed/performed           Past Medical History:  Diagnosis Date   Allergy    Anxiety    Arthritis    Cataract    Depression    GERD (gastroesophageal reflux disease)    Headache(784.0)    migrains   Heart murmur    Hypothyroidism    Osteoporosis 08/12/2023   DEXA 4/25: t score of    Pre-diabetes    Rheumatoid arthritis(714.0)    Past Surgical History:  Procedure Laterality Date   ABDOMINAL HYSTERECTOMY     ANTERIOR CERVICAL DECOMP/DISCECTOMY FUSION N/A 01/03/2018   Procedure: ANTERIOR CERVICAL DECOMPRESSION/DISCECTOMY FUSION, ALLOGRAFT, PLATE;  Surgeon: Barbarann Oneil BROCKS, MD;  Location: [facility] OR;  Service: Orthopedics;  Laterality: N/A;   APPENDECTOMY     CHOLECYSTECTOMY     JOINT REPLACEMENT     Both hips and both knees   REPLACEMENT TOTAL KNEE BILATERAL     SPINE SURGERY     TONSILLECTOMY     as  a child   TOTAL HIP ARTHROPLASTY     right and left   Patient Active Problem List   Diagnosis Date Noted   Osteoporosis 08/12/2023   Excessive sleepiness 04/01/2023   Unsteady gait 04/03/2022   Bilateral hearing loss 03/12/2022   Urinary incontinence 12/25/2021   Mild dementia (HCC) 06/24/2021   Coccydynia 12/23/2020   Gastroesophageal reflux disease with esophagitis without hemorrhage 04/05/2020   Iron deficiency anemia  06/22/2019   Lymphatic edema 12/06/2018   Sleep disturbance 12/05/2018   Venous stasis dermatitis of both lower extremities 09/20/2018   Varicose veins of both lower extremities 08/22/2018   Depression, major, single episode, mild 08/22/2018   Falls frequently 08/12/2018   Mass of thigh, right 05/17/2018   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   Status post cervical spinal fusion 01/11/2018   Chronic neck pain 10/20/2017   Chronic venous insufficiency 04/29/2017   IFG (impaired fasting glucose) 05/09/2014   Chronic constipation 02/19/2014   Rosacea 11/06/2013   GAD (generalized anxiety disorder) 06/08/2013   Essential hypertension, benign 05/04/2013   Migraine with aura 05/04/2013   Murmur, heart 05/04/2013   Hyperlipidemia 05/04/2013   Subclinical hyperthyroidism 05/04/2013   Fatigue 02/16/2012   Mixed stress and urge urinary incontinence 02/16/2012   Hyperglycemia 01/12/2012    ONSET DATE: 04/28/24  REFERRING DIAG:  M25.561,M25.562,G89.29 (ICD-10-CM) - Chronic pain of both knees  M62.81 (ICD-10-CM) - Weakness of both quadriceps muscles  Z96.653 (ICD-10-CM) - History of knee replacement, total, bilateral  R26.81 (ICD-10-CM) - Unsteady gait    THERAPY DIAG:  Muscle weakness (generalized)  Chronic pain of right knee  Chronic pain of left knee  Rationale for Evaluation and Treatment: Rehabilitation  SUBJECTIVE:                                                                                                                                                                                             SUBJECTIVE STATEMENT: The patient is doing well today. She has a cyst in her L knee distal to the patella (distal medial) that is sore. Pt accompanied by: family member  PERTINENT HISTORY: arthritis, h/o bilat TKR, bilat THR, osteoporosis, incontinence, mild dementia The patient has had worsened knee pain, walking, and weakness. She has some L LE swelling-- ruled  out blood clot. She has had some respiratory illness with increased coughing (in the past 2 weeks).  PAIN:  Are you having pain? Yes: NPRS scale: not able to rate-- c/o pain regularly at home Pain location: bilateral kne pain Pain description: aching, weakness Aggravating factors: hard to get up Relieving factors: rest  PRECAUTIONS: Fall   WEIGHT BEARING RESTRICTIONS: No  FALLS: Has patient fallen in last 6 months? Yes. Number of falls 3 x in the home  LIVING ENVIRONMENT: Lives with: lives with their spouse Lives in: House/apartment Stairs: No Has following equipment at home: Single point cane, Wheelchair (power), shower chair, and 3 wheeled walker  PLOF: Independent  PATIENT GOALS: Be able to get up out of a chair and walk better.   OBJECTIVE:  Note: Objective measures were completed at Evaluation unless otherwise noted.  COGNITION: Overall cognitive status: History of cognitive impairments - at baseline   SENSATION: WFL  EDEMA:  Notes some mild edema L LE-- not observed during eval today  POSTURE: patient maintains trunk flexion in standing  LOWER EXTREMITY ROM:   some tightness observed in hip flexors, trunk extension and gastoc per standing positioning.  LOWER EXTREMITY MMT:   MMT Right Eval Left Eval  Hip flexion 4/5 4/5  Hip extension    Knee flexion 5/5 5/5  Knee extension 5/5 5/5  Ankle dorsiflexion 3/5 4/5  Ankle plantarflexion    (Blank rows = not tested)  TRANSFERS: Sit to stand: SBA  Assistive device utilized: 3 wheeled walker     Stand to sit: SBA  Assistive device utilized: uses legs against back of chair      GAIT: Findings: Gait Characteristics: decreased stride length, shuffling, decreased trunk rotation, trunk flexed, poor foot clearance- Right, and poor foot clearance- Left, Distance walked: 60 feet, Assistive device utilized:3 wheeeled walker, Level of assistance: SBA, and Comments: 18 ft/18.05 seconds  FUNCTIONAL TESTS:  5 times sit to  stand: 21.41 seconds Timed up and go (TUG): 34.1 seconds with RW   PATIENT SURVEYS:  N/a  due to cognition  [facility] Adult PT Treatment:                                                DATE: 05/18/24 Manual Therapy: Kinesio tape over pes anserine L knee with instructions provided to hustband Neuromuscular re-ed: Side stepping along countertop Marching with one UE support Alternating foot taps to cones x 10 reps Single leg standing near support surface Therapeutic Activity: Mini squats x 10 reps Heel raises x 10 reps Heel/toe raises at counter x 10 reps Up/up and down/down to 4 step x 6 reps each side leading Lateral stepping x 10 reps  Gait: Gait with rollator RW x 60 feet x 3 reps with cues for posture   [facility] Adult PT Treatment:                                                DATE: 05/15/24 Therapeutic Exercise: Seated LAQ x 10 reps R and L  Red theraband with LAQ and knee flexion x 12 reps R and L each Ball squeeze 10 x 3 seconds hold Neuromuscular re-ed: Wall bumps x 10 reps with UE support and mod cues for demo and tactile to get hip strategy movement Standing in stride with wall slides of contralateral UE Therapeutic Activity: Wall slides x 10 reps  Standing heel raises Sit<>stand x 10 reps with cues for reducing LE leaning against mat table Gait: Gait x 60 feet x 3 sets with RW (clinic walker versus 3 wheeled rollator)   [facility] Adult PT Treatment:                                                DATE: 05/10/24 Therapeutic Exercise: Seated LAQ red TB x 10 Seated HS curl red TB x 10 Seated hip abd 2 x 10 red TB Seated adduction ball squeeze 10 x 3 sec hold Neuromuscular re-ed: Standing wt shifts lateral, diagonal all with HHA, min A Standing alternating tap ups 4'' step, HHA, Mod A Reaching hi/low for cones mod A Tapping colored dots all directions in 1,2,3 sequence patterns - mod A for balance, good cognition Therapeutic Activity: Sit <> stand 2 x 5 for warm up - min  A     [facility] Adult PT Treatment:                                                DATE: 05/08/24 Therapeutic Activity: Sit<>stand x 5 reps with UE support Standing heel raises at the countertop x 10 reps Wall bumps x 10 reps with walker anteriorly positioned Discussed/reviewed home safety and having spouse or caregiver present Adjusted height of the walker  PATIENT EDUCATION: Education details: HEP, goals of current plan, home safety, walker height Person educated: Patient Education method: Explanation, Demonstration, and Handouts Education comprehension: verbalized understanding, returned demonstration, and needs further education  HOME EXERCISE PROGRAM: Access Code: [email or web address] URL: [email or web address] Date: 05/10/2024 Prepared by: Darice Conine  Exercises -  Sit to Stand with Armchair  - 1 x daily - 7 x weekly - 2 sets - 5 reps - Heel Raises with Counter Support  - 1 x daily - 7 x weekly - 1 sets - 10 reps - Standing Anterior Posterior Weight Shift with Chair  - 1 x daily - 7 x weekly - 2 sets - 10 reps - Side to Side Weight Shift with Counter Support  - 1 x daily - 7 x weekly - 2 sets - 10 reps - Tap up  - 1 x daily - 7 x weekly - 2 sets - 10 reps  GOALS: Goals reviewed with patient? Yes  SHORT TERM GOALS: Target date: 06/08/24  The patient will be indep with initial HEP Baseline: initiated at eval Goal status: INITIAL  2.  The patient will improve Berg score to > or equal to 28/56.  Baseline:  23/56 Goal status: INITIAL  3.  The patient/caregiver will verbalize being able to move sit>stand from loveseat at home without difficulty. Baseline:  Notes unable to rise from loveseat at home Goal status: INITIAL  LONG TERM GOALS: Target date: 07/07/24  The patient will be indep with progression of HEP. Baseline:  initiated at eval Goal status: INITIAL  2.  The patient will improve 5 time sit<>stand to < or equal to 17 seconds to demonstrate improved functional  abilities. Baseline: 21.41 seconds Goal status: INITIAL  3.  The patient will improve gait speed to > or equal to 1.4 ft/sec to demo improving mobility for home. Baseline:  1 ft/sec Goal status: INITIAL  4.  The patient will improve Berg score to > or equal to 30/56. Baseline:  23/56 Goal status: INITIAL  5.  Reduce TUG to < or equal to 28 seconds. Baseline:  34.1 seconds Goal status: INITIAL  ASSESSMENT:  CLINICAL IMPRESSION: Yohanna is continuing to show progress with therapy. She is tolerating increased activities with components of [place] added today. PT continuing to work on gait activities with postural cues for upright, standing balance, and LE strength. She has some swellign L medial knee and PT trialed K tape to see if this provides support. The patient needs help at home for HEP. PT to continue to progress gait, balance, strengthening.  OBJECTIVE IMPAIRMENTS: Abnormal gait, decreased activity tolerance, decreased balance, decreased strength, impaired flexibility, and pain.   PLAN:  PT FREQUENCY: 2x/week  PT DURATION: 8 weeks  PLANNED INTERVENTIONS: [id number]- PT Re-evaluation, 97750- Physical Performance Testing, 97110-Therapeutic exercises, 97530- Therapeutic activity, 97112- Neuromuscular re-education, 97535- Self Care, [id number]- Manual therapy, [phone]- Gait training, Patient/Family education, and Balance training  PLAN FOR NEXT SESSION: Standing balance/posture/weight shifting; adjust height to lower 3 wheeled walker further (adjusted down 2, but needs to go further-- plan to slowly lower it), LE strength training, gait training, functional training.    Robi Mitter, PT 05/18/2024, 2:10 PM        "

## 2024-05-23 ENCOUNTER — Encounter: Payer: Self-pay | Admitting: Physical Therapy

## 2024-05-23 ENCOUNTER — Ambulatory Visit: Admitting: Physical Therapy

## 2024-05-23 DIAGNOSIS — G8929 Other chronic pain: Secondary | ICD-10-CM

## 2024-05-23 DIAGNOSIS — M6281 Muscle weakness (generalized): Secondary | ICD-10-CM | POA: Diagnosis not present

## 2024-05-23 NOTE — Therapy (Signed)
 " OUTPATIENT PHYSICAL THERAPY NEURO TREATMENT   Patient Name: Joann Maddox MRN: [id number] DOB:[birth year], 84 y.o., female Today's Date: 05/23/2024   PCP: Dorothyann Byars, MD REFERRING PROVIDER: Lonell Sprang, DO  END OF SESSION:  PT End of Session - 05/23/24 1447     Visit Number 5    Number of Visits 16    Date for Recertification  07/07/24    Authorization Type [employer] medicare    Authorization - Visit Number 5    Progress Note Due on Visit 10    PT Start Time 1400    PT Stop Time 1445    PT Time Calculation (min) 45 min    Equipment Utilized During Treatment Gait belt    Activity Tolerance Patient tolerated treatment well    Behavior During Therapy WFL for tasks assessed/performed            Past Medical History:  Diagnosis Date   Allergy    Anxiety    Arthritis    Cataract    Depression    GERD (gastroesophageal reflux disease)    Headache(784.0)    migrains   Heart murmur    Hypothyroidism    Osteoporosis 08/12/2023   DEXA 4/25: t score of    Pre-diabetes    Rheumatoid arthritis(714.0)    Past Surgical History:  Procedure Laterality Date   ABDOMINAL HYSTERECTOMY     ANTERIOR CERVICAL DECOMP/DISCECTOMY FUSION N/A 01/03/2018   Procedure: ANTERIOR CERVICAL DECOMPRESSION/DISCECTOMY FUSION, ALLOGRAFT, PLATE;  Surgeon: Barbarann Oneil BROCKS, MD;  Location: [facility] OR;  Service: Orthopedics;  Laterality: N/A;   APPENDECTOMY     CHOLECYSTECTOMY     JOINT REPLACEMENT     Both hips and both knees   REPLACEMENT TOTAL KNEE BILATERAL     SPINE SURGERY     TONSILLECTOMY     as  a child   TOTAL HIP ARTHROPLASTY     right and left   Patient Active Problem List   Diagnosis Date Noted   Osteoporosis 08/12/2023   Excessive sleepiness 04/01/2023   Unsteady gait 04/03/2022   Bilateral hearing loss 03/12/2022   Urinary incontinence 12/25/2021   Mild dementia (HCC) 06/24/2021   Coccydynia 12/23/2020   Gastroesophageal reflux disease with esophagitis without hemorrhage  04/05/2020   Iron deficiency anemia 06/22/2019   Lymphatic edema 12/06/2018   Sleep disturbance 12/05/2018   Venous stasis dermatitis of both lower extremities 09/20/2018   Varicose veins of both lower extremities 08/22/2018   Depression, major, single episode, mild 08/22/2018   Falls frequently 08/12/2018   Mass of thigh, right 05/17/2018   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   Status post cervical spinal fusion 01/11/2018   Chronic neck pain 10/20/2017   Chronic venous insufficiency 04/29/2017   IFG (impaired fasting glucose) 05/09/2014   Chronic constipation 02/19/2014   Rosacea 11/06/2013   GAD (generalized anxiety disorder) 06/08/2013   Essential hypertension, benign 05/04/2013   Migraine with aura 05/04/2013   Murmur, heart 05/04/2013   Hyperlipidemia 05/04/2013   Subclinical hyperthyroidism 05/04/2013   Fatigue 02/16/2012   Mixed stress and urge urinary incontinence 02/16/2012   Hyperglycemia 01/12/2012    ONSET DATE: 04/28/24  REFERRING DIAG:  M25.561,M25.562,G89.29 (ICD-10-CM) - Chronic pain of both knees  M62.81 (ICD-10-CM) - Weakness of both quadriceps muscles  Z96.653 (ICD-10-CM) - History of knee replacement, total, bilateral  R26.81 (ICD-10-CM) - Unsteady gait    THERAPY DIAG:  Muscle weakness (generalized)  Chronic pain of right knee  Chronic pain of left  knee  Rationale for Evaluation and Treatment: Rehabilitation  SUBJECTIVE:                                                                                                                                                                                             SUBJECTIVE STATEMENT: Pt arrives with Ktape still applied. She states she could not feel much of a difference with tape. She is still having Lt knee pain Pt accompanied by: family member  PERTINENT HISTORY: arthritis, h/o bilat TKR, bilat THR, osteoporosis, incontinence, mild dementia The patient has had worsened knee pain,  walking, and weakness. She has some L LE swelling-- ruled out blood clot. She has had some respiratory illness with increased coughing (in the past 2 weeks).  PAIN:  Are you having pain? Yes: NPRS scale: not able to rate-- c/o pain regularly at home Pain location: bilateral kne pain Pain description: aching, weakness Aggravating factors: hard to get up Relieving factors: rest  PRECAUTIONS: Fall   WEIGHT BEARING RESTRICTIONS: No  FALLS: Has patient fallen in last 6 months? Yes. Number of falls 3 x in the home  LIVING ENVIRONMENT: Lives with: lives with their spouse Lives in: House/apartment Stairs: No Has following equipment at home: Single point cane, Wheelchair (power), shower chair, and 3 wheeled walker  PLOF: Independent  PATIENT GOALS: Be able to get up out of a chair and walk better.   OBJECTIVE:  Note: Objective measures were completed at Evaluation unless otherwise noted.  COGNITION: Overall cognitive status: History of cognitive impairments - at baseline   SENSATION: WFL  EDEMA:  Notes some mild edema L LE-- not observed during eval today  POSTURE: patient maintains trunk flexion in standing  LOWER EXTREMITY ROM:   some tightness observed in hip flexors, trunk extension and gastoc per standing positioning.  LOWER EXTREMITY MMT:   MMT Right Eval Left Eval  Hip flexion 4/5 4/5  Hip extension    Knee flexion 5/5 5/5  Knee extension 5/5 5/5  Ankle dorsiflexion 3/5 4/5  Ankle plantarflexion    (Blank rows = not tested)  TRANSFERS: Sit to stand: SBA  Assistive device utilized: 3 wheeled walker     Stand to sit: SBA  Assistive device utilized: uses legs against back of chair      GAIT: Findings: Gait Characteristics: decreased stride length, shuffling, decreased trunk rotation, trunk flexed, poor foot clearance- Right, and poor foot clearance- Left, Distance walked: 60 feet, Assistive device utilized:3 wheeeled walker, Level of assistance: SBA, and Comments:  18 ft/18.05 seconds  FUNCTIONAL TESTS:  5 times sit to stand: 21.41 seconds Timed up and go (  TUG): 34.1 seconds with RW   PATIENT SURVEYS:  N/a due to cognition  [facility] Adult PT Treatment:                                                DATE: 05/23/24 Therapeutic Exercise: Red theraband with LAQ and knee flexion x 12 reps R and L each  Neuromuscular re-ed: Side stepping with UE support Backward walking with UE support Standing march 2 UE support --> 1 UE support Stair negotiation alternating pattern 2 handrails Semi tandem stance 2 x 30 sec bilat, intermittent UE support/CGA Gait around obstacles with RW, CGA Therapeutic Activity: Mini squats x 10 Heel raises x 10 Step up/down 4'' step x 8 each LE Hip abd x 10 bilat HS curl x 10 bilat Sit <> stand x 10 cues for eccentric control  [facility] Adult PT Treatment:                                                DATE: 05/18/24 Manual Therapy: Kinesio tape over pes anserine L knee with instructions provided to hustband Neuromuscular re-ed: Side stepping along countertop Marching with one UE support Alternating foot taps to cones x 10 reps Single leg standing near support surface Therapeutic Activity: Mini squats x 10 reps Heel raises x 10 reps Heel/toe raises at counter x 10 reps Up/up and down/down to 4 step x 6 reps each side leading Lateral stepping x 10 reps  Gait: Gait with rollator RW x 60 feet x 3 reps with cues for posture   [facility] Adult PT Treatment:                                                DATE: 05/15/24 Therapeutic Exercise: Seated LAQ x 10 reps R and L  Red theraband with LAQ and knee flexion x 12 reps R and L each Ball squeeze 10 x 3 seconds hold Neuromuscular re-ed: Wall bumps x 10 reps with UE support and mod cues for demo and tactile to get hip strategy movement Standing in stride with wall slides of contralateral UE Therapeutic Activity: Wall slides x 10 reps  Standing heel raises Sit<>stand x 10 reps with  cues for reducing LE leaning against mat table Gait: Gait x 60 feet x 3 sets with RW (clinic walker versus 3 wheeled rollator)   [facility] Adult PT Treatment:                                                DATE: 05/10/24 Therapeutic Exercise: Seated LAQ red TB x 10 Seated HS curl red TB x 10 Seated hip abd 2 x 10 red TB Seated adduction ball squeeze 10 x 3 sec hold Neuromuscular re-ed: Standing wt shifts lateral, diagonal all with HHA, min A Standing alternating tap ups 4'' step, HHA, Mod A Reaching hi/low for cones mod A Tapping colored dots all directions in 1,2,3 sequence patterns - mod A for balance, good cognition  Therapeutic Activity: Sit <> stand 2 x 5 for warm up - min A  PATIENT EDUCATION: Education details: HEP, goals of current plan, home safety, walker height Person educated: Patient Education method: Explanation, Demonstration, and Handouts Education comprehension: verbalized understanding, returned demonstration, and needs further education  HOME EXERCISE PROGRAM: Access Code: [email or web address] URL: [email or web address] Date: 05/10/2024 Prepared by: Darice Conine  Exercises - Sit to Stand with Armchair  - 1 x daily - 7 x weekly - 2 sets - 5 reps - Heel Raises with Counter Support  - 1 x daily - 7 x weekly - 1 sets - 10 reps - Standing Anterior Posterior Weight Shift with Chair  - 1 x daily - 7 x weekly - 2 sets - 10 reps - Side to Side Weight Shift with Counter Support  - 1 x daily - 7 x weekly - 2 sets - 10 reps - Tap up  - 1 x daily - 7 x weekly - 2 sets - 10 reps  GOALS: Goals reviewed with patient? Yes  SHORT TERM GOALS: Target date: 06/08/24  The patient will be indep with initial HEP Baseline: initiated at eval Goal status: INITIAL  2.  The patient will improve Berg score to > or equal to 28/56.  Baseline:  23/56 Goal status: INITIAL  3.  The patient/caregiver will verbalize being able to move sit>stand from loveseat at home without  difficulty. Baseline:  Notes unable to rise from loveseat at home Goal status: INITIAL  LONG TERM GOALS: Target date: 07/07/24  The patient will be indep with progression of HEP. Baseline:  initiated at eval Goal status: INITIAL  2.  The patient will improve 5 time sit<>stand to < or equal to 17 seconds to demonstrate improved functional abilities. Baseline: 21.41 seconds Goal status: INITIAL  3.  The patient will improve gait speed to > or equal to 1.4 ft/sec to demo improving mobility for home. Baseline:  1 ft/sec Goal status: INITIAL  4.  The patient will improve Berg score to > or equal to 30/56. Baseline:  23/56 Goal status: INITIAL  5.  Reduce TUG to < or equal to 28 seconds. Baseline:  34.1 seconds Goal status: INITIAL  ASSESSMENT:  CLINICAL IMPRESSION: Continued to progress [place] exercises with pt with good tolerance. She continues with decreased balance with backward gait and tandem stance but is able to maintain balance during gait around obstacles and sidestepping with UE support  OBJECTIVE IMPAIRMENTS: Abnormal gait, decreased activity tolerance, decreased balance, decreased strength, impaired flexibility, and pain.   PLAN:  PT FREQUENCY: 2x/week  PT DURATION: 8 weeks  PLANNED INTERVENTIONS: 97164- PT Re-evaluation, 97750- Physical Performance Testing, 97110-Therapeutic exercises, 97530- Therapeutic activity, 97112- Neuromuscular re-education, 97535- Self Care, 02859- Manual therapy, [phone]- Gait training, Patient/Family education, and Balance training  PLAN FOR NEXT SESSION: Standing balance/posture/weight shifting; adjust height to lower 3 wheeled walker further (adjusted down 2, but needs to go further-- plan to slowly lower it), LE strength training, gait training, functional training.    Brytney Somes, PT 05/23/2024, 2:48 PM   "

## 2024-05-24 ENCOUNTER — Ambulatory Visit: Payer: Self-pay | Admitting: Urgent Care

## 2024-05-25 ENCOUNTER — Ambulatory Visit: Admitting: Rehabilitative and Restorative Service Providers"

## 2024-05-25 ENCOUNTER — Encounter: Payer: Self-pay | Admitting: Rehabilitative and Restorative Service Providers"

## 2024-05-25 DIAGNOSIS — M6281 Muscle weakness (generalized): Secondary | ICD-10-CM

## 2024-05-25 DIAGNOSIS — G8929 Other chronic pain: Secondary | ICD-10-CM

## 2024-05-25 NOTE — Therapy (Signed)
 " OUTPATIENT PHYSICAL THERAPY NEURO TREATMENT   Patient Name: Joann Maddox MRN: [id number] DOB:22-Dec-1940, 84 y.o., female Today's Date: 05/25/2024   PCP: Dorothyann Byars, MD REFERRING PROVIDER: Lonell Sprang, DO  END OF SESSION:  PT End of Session - 05/25/24 1406     Visit Number 6    Number of Visits 16    Date for Recertification  07/07/24    Authorization Type [employer]BS medicare    Progress Note Due on Visit 10    PT Start Time 1407    PT Stop Time 1447    PT Time Calculation (min) 40 min    Equipment Utilized During Treatment Gait belt    Activity Tolerance Patient tolerated treatment well    Behavior During Therapy WFL for tasks assessed/performed            Past Medical History:  Diagnosis Date   Allergy    Anxiety    Arthritis    Cataract    Depression    GERD (gastroesophageal reflux disease)    Headache(784.0)    migrains   Heart murmur    Hypothyroidism    Osteoporosis 08/12/2023   DEXA 4/25: t score of    Pre-diabetes    Rheumatoid arthritis(714.0)    Past Surgical History:  Procedure Laterality Date   ABDOMINAL HYSTERECTOMY     ANTERIOR CERVICAL DECOMP/DISCECTOMY FUSION N/A 01/03/2018   Procedure: ANTERIOR CERVICAL DECOMPRESSION/DISCECTOMY FUSION, ALLOGRAFT, PLATE;  Surgeon: Barbarann Oneil BROCKS, MD;  Location: [facility] OR;  Service: Orthopedics;  Laterality: N/A;   APPENDECTOMY     CHOLECYSTECTOMY     JOINT REPLACEMENT     Both hips and both knees   REPLACEMENT TOTAL KNEE BILATERAL     SPINE SURGERY     TONSILLECTOMY     as  a child   TOTAL HIP ARTHROPLASTY     right and left   Patient Active Problem List   Diagnosis Date Noted   Osteoporosis 08/12/2023   Excessive sleepiness 04/01/2023   Unsteady gait 04/03/2022   Bilateral hearing loss 03/12/2022   Urinary incontinence 12/25/2021   Mild dementia (HCC) 06/24/2021   Coccydynia 12/23/2020   Gastroesophageal reflux disease with esophagitis without hemorrhage 04/05/2020   Iron deficiency anemia  06/22/2019   Lymphatic edema 12/06/2018   Sleep disturbance 12/05/2018   Venous stasis dermatitis of both lower extremities 09/20/2018   Varicose veins of both lower extremities 08/22/2018   Depression, major, single episode, mild 08/22/2018   Falls frequently 08/12/2018   Mass of thigh, right 05/17/2018   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   Status post cervical spinal fusion 01/11/2018   Chronic neck pain 10/20/2017   Chronic venous insufficiency 04/29/2017   IFG (impaired fasting glucose) 05/09/2014   Chronic constipation 02/19/2014   Rosacea 11/06/2013   GAD (generalized anxiety disorder) 06/08/2013   Essential hypertension, benign 05/04/2013   Migraine with aura 05/04/2013   Murmur, heart 05/04/2013   Hyperlipidemia 05/04/2013   Subclinical hyperthyroidism 05/04/2013   Fatigue 02/16/2012   Mixed stress and urge urinary incontinence 02/16/2012   Hyperglycemia 01/12/2012    ONSET DATE: 04/28/24  REFERRING DIAG:  M25.561,M25.562,G89.29 (ICD-10-CM) - Chronic pain of both knees  M62.81 (ICD-10-CM) - Weakness of both quadriceps muscles  Z96.653 (ICD-10-CM) - History of knee replacement, total, bilateral  R26.81 (ICD-10-CM) - Unsteady gait    THERAPY DIAG:  Muscle weakness (generalized)  Chronic pain of right knee  Chronic pain of left knee  Rationale for Evaluation and Treatment: Rehabilitation  SUBJECTIVE:                                                                                                                                                                                             SUBJECTIVE STATEMENT: Pt arrives today-- still getting fluid in her knee. Her husband notes that she is having a hard time getting up from the love seat.  Pt accompanied by: family member  PERTINENT HISTORY: arthritis, h/o bilat TKR, bilat THR, osteoporosis, incontinence, mild dementia The patient has had worsened knee pain, walking, and weakness. She has some L LE  swelling-- ruled out blood clot. She has had some respiratory illness with increased coughing (in the past 2 weeks).  PAIN:  Are you having pain? Yes: NPRS scale: not able to rate-- c/o pain regularly at home Pain location: bilateral kne pain Pain description: aching, weakness Aggravating factors: hard to get up Relieving factors: rest  PRECAUTIONS: Fall   WEIGHT BEARING RESTRICTIONS: No  FALLS: Has patient fallen in last 6 months? Yes. Number of falls 3 x in the home  LIVING ENVIRONMENT: Lives with: lives with their spouse Lives in: House/apartment Stairs: No Has following equipment at home: Single point cane, Wheelchair (power), shower chair, and 3 wheeled walker  PLOF: Independent  PATIENT GOALS: Be able to get up out of a chair and walk better.   OBJECTIVE:  Note: Objective measures were completed at Evaluation unless otherwise noted.  COGNITION: Overall cognitive status: History of cognitive impairments - at baseline   SENSATION: WFL  EDEMA:  Notes some mild edema L LE-- not observed during eval today  POSTURE: patient maintains trunk flexion in standing  LOWER EXTREMITY ROM:   some tightness observed in hip flexors, trunk extension and gastoc per standing positioning.  LOWER EXTREMITY MMT:   MMT Right Eval Left Eval  Hip flexion 4/5 4/5  Hip extension    Knee flexion 5/5 5/5  Knee extension 5/5 5/5  Ankle dorsiflexion 3/5 4/5  Ankle plantarflexion    (Blank rows = not tested)  TRANSFERS: Sit to stand: SBA  Assistive device utilized: 3 wheeled walker     Stand to sit: SBA  Assistive device utilized: uses legs against back of chair      GAIT: Findings: Gait Characteristics: decreased stride length, shuffling, decreased trunk rotation, trunk flexed, poor foot clearance- Right, and poor foot clearance- Left, Distance walked: 60 feet, Assistive device utilized:3 wheeeled walker, Level of assistance: SBA, and Comments: 18 ft/18.05 seconds  FUNCTIONAL TESTS:   5 times sit to stand: 21.41 seconds Timed up and go (TUG): 34.1 seconds with RW   PATIENT  SURVEYS:  N/a due to cognition  [facility] Adult PT Treatment:                                                DATE: 05/25/24 Neuromuscular re-ed: Standing balance with wall bumps and SBA Standing balance with pressing into physioball into wall for core engagement and proactive balance planning Therapeutic Activity: Sit<>stand x 10 reps Components of scooting forward/ legs under her/ and then nose over toes [place] components Sidestepping x 10 steps Hip abduction x 10  Knee flexion x 10 Heel raises x 10 Toe raises x 10 Standing neck motion Trunk rotation Seated LAQ Gait: With cues for upright posture Heel strike emphasis 80 feet, 20 feet x 4 reps with rollator  75 feet with rollator   [facility] Adult PT Treatment:                                                DATE: 05/23/24 Therapeutic Exercise: Red theraband with LAQ and knee flexion x 12 reps R and L each Neuromuscular re-ed: Side stepping with UE support Backward walking with UE support Standing march 2 UE support --> 1 UE support Stair negotiation alternating pattern 2 handrails Semi tandem stance 2 x 30 sec bilat, intermittent UE support/CGA Gait around obstacles with RW, CGA Therapeutic Activity: Mini squats x 10 Heel raises x 10 Step up/down 4'' step x 8 each LE Hip abd x 10 bilat HS curl x 10 bilat Sit <> stand x 10 cues for eccentric control   [facility] Adult PT Treatment:                                                DATE: 05/18/24 Manual Therapy: Kinesio tape over pes anserine L knee with instructions provided to hustband Neuromuscular re-ed: Side stepping along countertop Marching with one UE support Alternating foot taps to cones x 10 reps Single leg standing near support surface Therapeutic Activity: Mini squats x 10 reps Heel raises x 10 reps Heel/toe raises at counter x 10 reps Up/up and down/down to 4 step x 6 reps  each side leading Lateral stepping x 10 reps  Gait: Gait with rollator RW x 60 feet x 3 reps with cues for posture   [facility] Adult PT Treatment:                                                DATE: 05/15/24 Therapeutic Exercise: Seated LAQ x 10 reps R and L  Red theraband with LAQ and knee flexion x 12 reps R and L each Ball squeeze 10 x 3 seconds hold Neuromuscular re-ed: Wall bumps x 10 reps with UE support and mod cues for demo and tactile to get hip strategy movement Standing in stride with wall slides of contralateral UE Therapeutic Activity: Wall slides x 10 reps  Standing heel raises Sit<>stand x 10 reps with cues for reducing LE leaning against mat table Gait: Gait x 60  feet x 3 sets with RW (clinic walker versus 3 wheeled rollator)   PATIENT EDUCATION: Education details: HEP, goals of current plan, home safety, walker height Person educated: Patient Education method: Explanation, Demonstration, and Handouts Education comprehension: verbalized understanding, returned demonstration, and needs further education  HOME EXERCISE PROGRAM: Access Code: [email or web address] URL: [email or web address] Date: 05/10/2024 Prepared by: Darice Conine  Exercises - Sit to Stand with Armchair  - 1 x daily - 7 x weekly - 2 sets - 5 reps - Heel Raises with Counter Support  - 1 x daily - 7 x weekly - 1 sets - 10 reps - Standing Anterior Posterior Weight Shift with Chair  - 1 x daily - 7 x weekly - 2 sets - 10 reps - Side to Side Weight Shift with Counter Support  - 1 x daily - 7 x weekly - 2 sets - 10 reps - Tap up  - 1 x daily - 7 x weekly - 2 sets - 10 reps  GOALS: Goals reviewed with patient? Yes  SHORT TERM GOALS: Target date: 06/08/24  The patient will be indep with initial HEP Baseline: initiated at eval Goal status: INITIAL  2.  The patient will improve Berg score to > or equal to 28/56.  Baseline:  23/56 Goal status: INITIAL  3.  The patient/caregiver will verbalize  being able to move sit>stand from loveseat at home without difficulty. Baseline:  Notes unable to rise from loveseat at home Goal status: INITIAL  LONG TERM GOALS: Target date: 07/07/24  The patient will be indep with progression of HEP. Baseline:  initiated at eval Goal status: INITIAL  2.  The patient will improve 5 time sit<>stand to < or equal to 17 seconds to demonstrate improved functional abilities. Baseline: 21.41 seconds Goal status: INITIAL  3.  The patient will improve gait speed to > or equal to 1.4 ft/sec to demo improving mobility for home. Baseline:  1 ft/sec Goal status: INITIAL  4.  The patient will improve Berg score to > or equal to 30/56. Baseline:  23/56 Goal status: INITIAL  5.  Reduce TUG to < or equal to 28 seconds. Baseline:  34.1 seconds Goal status: INITIAL  ASSESSMENT:  CLINICAL IMPRESSION: PT worked on repeittion with sit<>stand transfers for home. We also worked on standing balance tasks and gait mechanics. Due to cognition, we are working on blocked practice emphasizing the repetition of same tasks for improved carryover to home. PT to conitnue progressing ot STGs and LTGs.   OBJECTIVE IMPAIRMENTS: Abnormal gait, decreased activity tolerance, decreased balance, decreased strength, impaired flexibility, and pain.   PLAN:  PT FREQUENCY: 2x/week  PT DURATION: 8 weeks  PLANNED INTERVENTIONS: 97164- PT Re-evaluation, 97750- Physical Performance Testing, 97110-Therapeutic exercises, 97530- Therapeutic activity, 97112- Neuromuscular re-education, 97535- Self Care, 02859- Manual therapy, [phone]- Gait training, Patient/Family education, and Balance training  PLAN FOR NEXT SESSION: Standing balance/posture/weight shifting; adjust height to lower 3 wheeled walker further (adjusted down 2, but needs to go further-- plan to slowly lower it), LE strength training, gait training, functional training.    Tavish Gettis, PT 05/25/2024, 3:54 PM   "

## 2024-05-28 ENCOUNTER — Other Ambulatory Visit: Payer: Self-pay | Admitting: Family Medicine

## 2024-05-30 ENCOUNTER — Encounter: Payer: Self-pay | Admitting: Physical Therapy

## 2024-05-30 ENCOUNTER — Ambulatory Visit: Admitting: Physical Therapy

## 2024-05-30 DIAGNOSIS — M6281 Muscle weakness (generalized): Secondary | ICD-10-CM

## 2024-05-30 DIAGNOSIS — G8929 Other chronic pain: Secondary | ICD-10-CM

## 2024-05-30 NOTE — Therapy (Signed)
 " OUTPATIENT PHYSICAL THERAPY NEURO TREATMENT   Patient Name: Joann Maddox MRN: [id number] DOB:10-10-1940, 84 y.o., female Today's Date: 05/30/2024   PCP: Dorothyann Byars, MD REFERRING PROVIDER: Lonell Sprang, DO  END OF SESSION:  PT End of Session - 05/30/24 1658     Visit Number 7    Number of Visits 16    Date for Recertification  07/07/24    Authorization Type [employer] medicare    Authorization - Visit Number 6    Progress Note Due on Visit 10    PT Start Time 1615    PT Stop Time 1700    PT Time Calculation (min) 45 min    Equipment Utilized During Treatment Gait belt    Activity Tolerance Patient tolerated treatment well    Behavior During Therapy WFL for tasks assessed/performed             Past Medical History:  Diagnosis Date   Allergy    Anxiety    Arthritis    Cataract    Depression    GERD (gastroesophageal reflux disease)    Headache(784.0)    migrains   Heart murmur    Hypothyroidism    Osteoporosis 08/12/2023   DEXA 4/25: t score of    Pre-diabetes    Rheumatoid arthritis(714.0)    Past Surgical History:  Procedure Laterality Date   ABDOMINAL HYSTERECTOMY     ANTERIOR CERVICAL DECOMP/DISCECTOMY FUSION N/A 01/03/2018   Procedure: ANTERIOR CERVICAL DECOMPRESSION/DISCECTOMY FUSION, ALLOGRAFT, PLATE;  Surgeon: Barbarann Oneil BROCKS, MD;  Location: [facility] OR;  Service: Orthopedics;  Laterality: N/A;   APPENDECTOMY     CHOLECYSTECTOMY     JOINT REPLACEMENT     Both hips and both knees   REPLACEMENT TOTAL KNEE BILATERAL     SPINE SURGERY     TONSILLECTOMY     as  a child   TOTAL HIP ARTHROPLASTY     right and left   Patient Active Problem List   Diagnosis Date Noted   Osteoporosis 08/12/2023   Excessive sleepiness 04/01/2023   Unsteady gait 04/03/2022   Bilateral hearing loss 03/12/2022   Urinary incontinence 12/25/2021   Mild dementia (HCC) 06/24/2021   Coccydynia 12/23/2020   Gastroesophageal reflux disease with esophagitis without hemorrhage  04/05/2020   Iron deficiency anemia 06/22/2019   Lymphatic edema 12/06/2018   Sleep disturbance 12/05/2018   Venous stasis dermatitis of both lower extremities 09/20/2018   Varicose veins of both lower extremities 08/22/2018   Depression, major, single episode, mild 08/22/2018   Falls frequently 08/12/2018   Mass of thigh, right 05/17/2018   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   Status post cervical spinal fusion 01/11/2018   Chronic neck pain 10/20/2017   Chronic venous insufficiency 04/29/2017   IFG (impaired fasting glucose) 05/09/2014   Chronic constipation 02/19/2014   Rosacea 11/06/2013   GAD (generalized anxiety disorder) 06/08/2013   Essential hypertension, benign 05/04/2013   Migraine with aura 05/04/2013   Murmur, heart 05/04/2013   Hyperlipidemia 05/04/2013   Subclinical hyperthyroidism 05/04/2013   Fatigue 02/16/2012   Mixed stress and urge urinary incontinence 02/16/2012   Hyperglycemia 01/12/2012    ONSET DATE: 04/28/24  REFERRING DIAG:  M25.561,M25.562,G89.29 (ICD-10-CM) - Chronic pain of both knees  M62.81 (ICD-10-CM) - Weakness of both quadriceps muscles  Z96.653 (ICD-10-CM) - History of knee replacement, total, bilateral  R26.81 (ICD-10-CM) - Unsteady gait    THERAPY DIAG:  Muscle weakness (generalized)  Chronic pain of left knee  Rationale for Evaluation  and Treatment: Rehabilitation  SUBJECTIVE:                                                                                                                                                                                             SUBJECTIVE STATEMENT: Pt states her knee is feeling a little better and is not hurting too bad today. Pt accompanied by: family member  PERTINENT HISTORY: arthritis, h/o bilat TKR, bilat THR, osteoporosis, incontinence, mild dementia The patient has had worsened knee pain, walking, and weakness. She has some L LE swelling-- ruled out blood clot. She has had  some respiratory illness with increased coughing (in the past 2 weeks).  PAIN:  Are you having pain? Yes: NPRS scale: not able to rate-- c/o pain regularly at home Pain location: bilateral kne pain Pain description: aching, weakness Aggravating factors: hard to get up Relieving factors: rest  PRECAUTIONS: Fall   WEIGHT BEARING RESTRICTIONS: No  FALLS: Has patient fallen in last 6 months? Yes. Number of falls 3 x in the home  LIVING ENVIRONMENT: Lives with: lives with their spouse Lives in: House/apartment Stairs: No Has following equipment at home: Single point cane, Wheelchair (power), shower chair, and 3 wheeled walker  PLOF: Independent  PATIENT GOALS: Be able to get up out of a chair and walk better.   OBJECTIVE:  Note: Objective measures were completed at Evaluation unless otherwise noted.  COGNITION: Overall cognitive status: History of cognitive impairments - at baseline   SENSATION: WFL  EDEMA:  Notes some mild edema L LE-- not observed during eval today  POSTURE: patient maintains trunk flexion in standing  LOWER EXTREMITY ROM:   some tightness observed in hip flexors, trunk extension and gastoc per standing positioning.  LOWER EXTREMITY MMT:   MMT Right Eval Left Eval  Hip flexion 4/5 4/5  Hip extension    Knee flexion 5/5 5/5  Knee extension 5/5 5/5  Ankle dorsiflexion 3/5 4/5  Ankle plantarflexion    (Blank rows = not tested)  TRANSFERS: Sit to stand: SBA  Assistive device utilized: 3 wheeled walker     Stand to sit: SBA  Assistive device utilized: uses legs against back of chair      GAIT: Findings: Gait Characteristics: decreased stride length, shuffling, decreased trunk rotation, trunk flexed, poor foot clearance- Right, and poor foot clearance- Left, Distance walked: 60 feet, Assistive device utilized:3 wheeeled walker, Level of assistance: SBA, and Comments: 18 ft/18.05 seconds  FUNCTIONAL TESTS:  5 times sit to stand: 21.41 seconds Timed  up and go (TUG): 34.1 seconds with RW   PATIENT SURVEYS:  N/a due to cognition  [facility] Adult PT Treatment:                                                DATE: 05/30/24  Neuromuscular re-ed: Tap ups alternating LE minA/HHA Stepping forward/backward over dowel for fwd/bkwd wt shifts with mod A/HHA Side steps min A/HHA Therapeutic Activity: [place] components with 2# ankle weight bilat LAQ 10 x 3 sec hold Hip abd x 10 Knee flexion x 10 Heel raises x 10 Toe raises x 10 Trunk rotation Sit <> Stand from elevated surface --> sit <> stand from lower mat table with CGA Gait: Gait with rollator Stop/start Speed changes   [facility] Adult PT Treatment:                                                DATE: 05/25/24 Neuromuscular re-ed: Standing balance with wall bumps and SBA Standing balance with pressing into physioball into wall for core engagement and proactive balance planning Therapeutic Activity: Sit<>stand x 10 reps Components of scooting forward/ legs under her/ and then nose over toes [place] components Sidestepping x 10 steps Hip abduction x 10  Knee flexion x 10 Heel raises x 10 Toe raises x 10 Standing neck motion Trunk rotation Seated LAQ Gait: With cues for upright posture Heel strike emphasis 80 feet, 20 feet x 4 reps with rollator  75 feet with rollator   [facility] Adult PT Treatment:                                                DATE: 05/23/24 Therapeutic Exercise: Red theraband with LAQ and knee flexion x 12 reps R and L each Neuromuscular re-ed: Side stepping with UE support Backward walking with UE support Standing march 2 UE support --> 1 UE support Stair negotiation alternating pattern 2 handrails Semi tandem stance 2 x 30 sec bilat, intermittent UE support/CGA Gait around obstacles with RW, CGA Therapeutic Activity: Mini squats x 10 Heel raises x 10 Step up/down 4'' step x 8 each LE Hip abd x 10 bilat HS curl x 10 bilat Sit <> stand x 10 cues for eccentric  control   [facility] Adult PT Treatment:                                                DATE: 05/18/24 Manual Therapy: Kinesio tape over pes anserine L knee with instructions provided to hustband Neuromuscular re-ed: Side stepping along countertop Marching with one UE support Alternating foot taps to cones x 10 reps Single leg standing near support surface Therapeutic Activity: Mini squats x 10 reps Heel raises x 10 reps Heel/toe raises at counter x 10 reps Up/up and down/down to 4 step x 6 reps each side leading Lateral stepping x 10 reps  Gait: Gait with rollator RW x 60 feet x 3 reps with cues for posture   PATIENT EDUCATION: Education details: HEP, goals of current plan, home safety, walker height Person educated: Patient Education  method: Explanation, Demonstration, and Handouts Education comprehension: verbalized understanding, returned demonstration, and needs further education  HOME EXERCISE PROGRAM: Access Code: [email or web address] URL: [email or web address] Date: 05/30/2024 Prepared by: Darice Conine  Exercises - Sit to Stand with Armchair  - 1 x daily - 7 x weekly - 2 sets - 5 reps - Heel Raises with Counter Support  - 1 x daily - 7 x weekly - 1 sets - 10 reps - Standing Knee Flexion with Counter Support  - 1 x daily - 7 x weekly - 2 sets - 10 reps - Standing Hip Abduction with Counter Support  - 1 x daily - 7 x weekly - 2 sets - 10 reps - Tap up  - 1 x daily - 7 x weekly - 2 sets - 10 reps  GOALS: Goals reviewed with patient? Yes  SHORT TERM GOALS: Target date: 06/08/24  The patient will be indep with initial HEP Baseline: initiated at eval Goal status: INITIAL  2.  The patient will improve Berg score to > or equal to 28/56.  Baseline:  23/56 Goal status: INITIAL  3.  The patient/caregiver will verbalize being able to move sit>stand from loveseat at home without difficulty. Baseline:  Notes unable to rise from loveseat at home Goal status:  INITIAL  LONG TERM GOALS: Target date: 07/07/24  The patient will be indep with progression of HEP. Baseline:  initiated at eval Goal status: INITIAL  2.  The patient will improve 5 time sit<>stand to < or equal to 17 seconds to demonstrate improved functional abilities. Baseline: 21.41 seconds Goal status: INITIAL  3.  The patient will improve gait speed to > or equal to 1.4 ft/sec to demo improving mobility for home. Baseline:  1 ft/sec Goal status: INITIAL  4.  The patient will improve Berg score to > or equal to 30/56. Baseline:  23/56 Goal status: INITIAL  5.  Reduce TUG to < or equal to 28 seconds. Baseline:  34.1 seconds Goal status: INITIAL  ASSESSMENT:  CLINICAL IMPRESSION: Continued to work on balance with progression of [place] exercises to add weights with good tolerance. Gait with speed changes without LOB. Pt with much difficulty with stepping over obstacles requiring mod A due to lack of wt shift  OBJECTIVE IMPAIRMENTS: Abnormal gait, decreased activity tolerance, decreased balance, decreased strength, impaired flexibility, and pain.   PLAN:  PT FREQUENCY: 2x/week  PT DURATION: 8 weeks  PLANNED INTERVENTIONS: 97164- PT Re-evaluation, 97750- Physical Performance Testing, 97110-Therapeutic exercises, 97530- Therapeutic activity, 97112- Neuromuscular re-education, 97535- Self Care, 02859- Manual therapy, [phone]- Gait training, Patient/Family education, and Balance training  PLAN FOR NEXT SESSION: Standing balance/posture/weight shifting; adjust height to lower 3 wheeled walker further (adjusted down 2, but needs to go further-- plan to slowly lower it), LE strength training, gait training, functional training.    Eulonda Andalon, PT 05/30/2024, 5:00 PM   "

## 2024-06-01 ENCOUNTER — Ambulatory Visit: Admitting: Rehabilitative and Restorative Service Providers"

## 2024-06-01 NOTE — Therapy (Unsigned)
 " OUTPATIENT PHYSICAL THERAPY NEURO TREATMENT   Patient Name: Joann Maddox MRN: [id number] DOB:01-06-1941, 84 y.o., female Today's Date: 06/01/2024   PCP: Joann Byars, MD REFERRING PROVIDER: Lonell Sprang, DO  END OF SESSION:       Past Medical History:  Diagnosis Date   Allergy    Anxiety    Arthritis    Cataract    Depression    GERD (gastroesophageal reflux disease)    Headache(784.0)    migrains   Heart murmur    Hypothyroidism    Osteoporosis 08/12/2023   DEXA 4/25: t score of    Pre-diabetes    Rheumatoid arthritis(714.0)    Past Surgical History:  Procedure Laterality Date   ABDOMINAL HYSTERECTOMY     ANTERIOR CERVICAL DECOMP/DISCECTOMY FUSION N/A 01/03/2018   Procedure: ANTERIOR CERVICAL DECOMPRESSION/DISCECTOMY FUSION, ALLOGRAFT, PLATE;  Surgeon: Joann Oneil BROCKS, MD;  Location: [facility] OR;  Service: Orthopedics;  Laterality: N/A;   APPENDECTOMY     CHOLECYSTECTOMY     JOINT REPLACEMENT     Both hips and both knees   REPLACEMENT TOTAL KNEE BILATERAL     SPINE SURGERY     TONSILLECTOMY     as  a child   TOTAL HIP ARTHROPLASTY     right and left   Patient Active Problem List   Diagnosis Date Noted   Osteoporosis 08/12/2023   Excessive sleepiness 04/01/2023   Unsteady gait 04/03/2022   Bilateral hearing loss 03/12/2022   Urinary incontinence 12/25/2021   Mild dementia (HCC) 06/24/2021   Coccydynia 12/23/2020   Gastroesophageal reflux disease with esophagitis without hemorrhage 04/05/2020   Iron deficiency anemia 06/22/2019   Lymphatic edema 12/06/2018   Sleep disturbance 12/05/2018   Venous stasis dermatitis of both lower extremities 09/20/2018   Varicose veins of both lower extremities 08/22/2018   Depression, major, single episode, mild 08/22/2018   Falls frequently 08/12/2018   Mass of thigh, right 05/17/2018   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   Status post cervical spinal fusion 01/11/2018   Chronic neck pain 10/20/2017    Chronic venous insufficiency 04/29/2017   IFG (impaired fasting glucose) 05/09/2014   Chronic constipation 02/19/2014   Rosacea 11/06/2013   GAD (generalized anxiety disorder) 06/08/2013   Essential hypertension, benign 05/04/2013   Migraine with aura 05/04/2013   Murmur, heart 05/04/2013   Hyperlipidemia 05/04/2013   Subclinical hyperthyroidism 05/04/2013   Fatigue 02/16/2012   Mixed stress and urge urinary incontinence 02/16/2012   Hyperglycemia 01/12/2012    ONSET DATE: 04/28/24  REFERRING DIAG:  M25.561,M25.562,G89.29 (ICD-10-CM) - Chronic pain of both knees  M62.81 (ICD-10-CM) - Weakness of both quadriceps muscles  Z96.653 (ICD-10-CM) - History of knee replacement, total, bilateral  R26.81 (ICD-10-CM) - Unsteady gait    THERAPY DIAG:  No diagnosis found.  Rationale for Evaluation and Treatment: Rehabilitation  SUBJECTIVE:  SUBJECTIVE STATEMENT: ***Pt states her knee is feeling a little better and is not hurting too bad today. Pt accompanied by: family member  PERTINENT HISTORY: arthritis, h/o bilat TKR, bilat THR, osteoporosis, incontinence, mild dementia The patient has had worsened knee pain, walking, and weakness. She has some L LE swelling-- ruled out blood clot. She has had some respiratory illness with increased coughing (in the past 2 weeks).  PAIN:  Are you having pain? Yes: NPRS scale: not able to rate-- c/o pain regularly at home Pain location: bilateral kne pain Pain description: aching, weakness Aggravating factors: hard to get up Relieving factors: rest  PRECAUTIONS: Fall   WEIGHT BEARING RESTRICTIONS: No  FALLS: Has patient fallen in last 6 months? Yes. Number of falls 3 x in the home  LIVING ENVIRONMENT: Lives with: lives with their spouse Lives in:  House/apartment Stairs: No Has following equipment at home: Single point cane, Wheelchair (power), shower chair, and 3 wheeled walker  PLOF: Independent  PATIENT GOALS: Be able to get up out of a chair and walk better.   OBJECTIVE:  Note: Objective measures were completed at Evaluation unless otherwise noted.  COGNITION: Overall cognitive status: History of cognitive impairments - at baseline   SENSATION: WFL  EDEMA:  Notes some mild edema L LE-- not observed during eval today  POSTURE: patient maintains trunk flexion in standing  LOWER EXTREMITY ROM:   some tightness observed in hip flexors, trunk extension and gastoc per standing positioning.  LOWER EXTREMITY MMT:   MMT Right Eval Left Eval  Hip flexion 4/5 4/5  Hip extension    Knee flexion 5/5 5/5  Knee extension 5/5 5/5  Ankle dorsiflexion 3/5 4/5  Ankle plantarflexion    (Blank rows = not tested)  TRANSFERS: Sit to stand: SBA  Assistive device utilized: 3 wheeled walker     Stand to sit: SBA  Assistive device utilized: uses legs against back of chair      GAIT: Findings: Gait Characteristics: decreased stride length, shuffling, decreased trunk rotation, trunk flexed, poor foot clearance- Right, and poor foot clearance- Left, Distance walked: 60 feet, Assistive device utilized:3 wheeeled walker, Level of assistance: SBA, and Comments: 18 ft/18.05 seconds  FUNCTIONAL TESTS:  5 times sit to stand: 21.41 seconds Timed up and go (TUG): 34.1 seconds with RW   PATIENT SURVEYS:  N/a due to cognition   [facility] Adult PT Treatment:                                                DATE: 06/01/24 Therapeutic Exercise: *** Manual Therapy: *** Neuromuscular re-ed: *** Therapeutic Activity: *** Gait: *** Modalities: *** Self Care: ***   [name] Adult PT Treatment:                                                DATE: 05/30/24 Neuromuscular re-ed: Tap ups alternating LE minA/HHA Stepping forward/backward over dowel for  fwd/bkwd wt shifts with mod A/HHA Side steps min A/HHA Therapeutic Activity: [place] components with 2# ankle weight bilat LAQ 10 x 3 sec hold Hip abd x 10 Knee flexion x 10 Heel raises x 10 Toe raises x 10 Trunk rotation Sit <> Stand from elevated surface --> sit <>  stand from lower mat table with CGA Gait: Gait with rollator Stop/start Speed changes   [facility] Adult PT Treatment:                                                DATE: 05/25/24 Neuromuscular re-ed: Standing balance with wall bumps and SBA Standing balance with pressing into physioball into wall for core engagement and proactive balance planning Therapeutic Activity: Sit<>stand x 10 reps Components of scooting forward/ legs under her/ and then nose over toes [place] components Sidestepping x 10 steps Hip abduction x 10  Knee flexion x 10 Heel raises x 10 Toe raises x 10 Standing neck motion Trunk rotation Seated LAQ Gait: With cues for upright posture Heel strike emphasis 80 feet, 20 feet x 4 reps with rollator  75 feet with rollator   [facility] Adult PT Treatment:                                                DATE: 05/23/24 Therapeutic Exercise: Red theraband with LAQ and knee flexion x 12 reps R and L each Neuromuscular re-ed: Side stepping with UE support Backward walking with UE support Standing march 2 UE support --> 1 UE support Stair negotiation alternating pattern 2 handrails Semi tandem stance 2 x 30 sec bilat, intermittent UE support/CGA Gait around obstacles with RW, CGA Therapeutic Activity: Mini squats x 10 Heel raises x 10 Step up/down 4'' step x 8 each LE Hip abd x 10 bilat HS curl x 10 bilat Sit <> stand x 10 cues for eccentric control   [facility] Adult PT Treatment:                                                DATE: 05/18/24 Manual Therapy: Kinesio tape over pes anserine L knee with instructions provided to hustband Neuromuscular re-ed: Side stepping along countertop Marching with one UE  support Alternating foot taps to cones x 10 reps Single leg standing near support surface Therapeutic Activity: Mini squats x 10 reps Heel raises x 10 reps Heel/toe raises at counter x 10 reps Up/up and down/down to 4 step x 6 reps each side leading Lateral stepping x 10 reps  Gait: Gait with rollator RW x 60 feet x 3 reps with cues for posture   PATIENT EDUCATION: Education details: HEP, goals of current plan, home safety, walker height Person educated: Patient Education method: Explanation, Demonstration, and Handouts Education comprehension: verbalized understanding, returned demonstration, and needs further education  HOME EXERCISE PROGRAM: Access Code: [email or web address] URL: [email or web address] Date: 05/30/2024 Prepared by: Darice Conine  Exercises - Sit to Stand with Armchair  - 1 x daily - 7 x weekly - 2 sets - 5 reps - Heel Raises with Counter Support  - 1 x daily - 7 x weekly - 1 sets - 10 reps - Standing Knee Flexion with Counter Support  - 1 x daily - 7 x weekly - 2 sets - 10 reps - Standing Hip Abduction with Counter Support  - 1 x daily - 7 x weekly - 2 sets - 10 reps -  Tap up  - 1 x daily - 7 x weekly - 2 sets - 10 reps  GOALS: Goals reviewed with patient? Yes  SHORT TERM GOALS: Target date: 06/08/24  The patient will be indep with initial HEP Baseline: initiated at eval Goal status: INITIAL  2.  The patient will improve Berg score to > or equal to 28/56.  Baseline:  23/56 Goal status: INITIAL  3.  The patient/caregiver will verbalize being able to move sit>stand from loveseat at home without difficulty. Baseline:  Notes unable to rise from loveseat at home Goal status: INITIAL  LONG TERM GOALS: Target date: 07/07/24  The patient will be indep with progression of HEP. Baseline:  initiated at eval Goal status: INITIAL  2.  The patient will improve 5 time sit<>stand to < or equal to 17 seconds to demonstrate improved functional  abilities. Baseline: 21.41 seconds Goal status: INITIAL  3.  The patient will improve gait speed to > or equal to 1.4 ft/sec to demo improving mobility for home. Baseline:  1 ft/sec Goal status: INITIAL  4.  The patient will improve Berg score to > or equal to 30/56. Baseline:  23/56 Goal status: INITIAL  5.  Reduce TUG to < or equal to 28 seconds. Baseline:  34.1 seconds Goal status: INITIAL  ASSESSMENT:  CLINICAL IMPRESSION: ***Continued to work on balance with progression of [place] exercises to add weights with good tolerance. Gait with speed changes without LOB. Pt with much difficulty with stepping over obstacles requiring mod A due to lack of wt shift  OBJECTIVE IMPAIRMENTS: Abnormal gait, decreased activity tolerance, decreased balance, decreased strength, impaired flexibility, and pain.   PLAN:  PT FREQUENCY: 2x/week  PT DURATION: 8 weeks  PLANNED INTERVENTIONS: 97164- PT Re-evaluation, 97750- Physical Performance Testing, 97110-Therapeutic exercises, 97530- Therapeutic activity, 97112- Neuromuscular re-education, 97535- Self Care, 02859- Manual therapy, [phone]- Gait training, Patient/Family education, and Balance training  PLAN FOR NEXT SESSION: Standing balance/posture/weight shifting; adjust height to lower 3 wheeled walker further (adjusted down 2, but needs to go further-- plan to slowly lower it), LE strength training, gait training, functional training.    Brodie Correll, PT 06/01/2024, 12:57 PM   "

## 2024-06-06 ENCOUNTER — Ambulatory Visit: Admitting: Rehabilitative and Restorative Service Providers"

## 2024-06-08 ENCOUNTER — Ambulatory Visit: Admitting: Rehabilitative and Restorative Service Providers"

## 2024-06-26 ENCOUNTER — Ambulatory Visit: Admitting: Sports Medicine
